# Patient Record
Sex: Female | Born: 1935 | Race: White | Hispanic: No | Marital: Married | Smoking: Never smoker
Health system: Southern US, Community
[De-identification: ages and names within clinical notes are randomized; demographics above are authoritative.]

## PROBLEM LIST (undated history)

## (undated) DIAGNOSIS — E039 Hypothyroidism, unspecified: Secondary | ICD-10-CM

## (undated) DIAGNOSIS — F0280 Dementia in other diseases classified elsewhere without behavioral disturbance: Secondary | ICD-10-CM

## (undated) DIAGNOSIS — E785 Hyperlipidemia, unspecified: Secondary | ICD-10-CM

## (undated) DIAGNOSIS — M069 Rheumatoid arthritis, unspecified: Secondary | ICD-10-CM

## (undated) DIAGNOSIS — I10 Essential (primary) hypertension: Secondary | ICD-10-CM

## (undated) HISTORY — DX: Rheumatoid arthritis, unspecified: M06.9

## (undated) HISTORY — DX: Dementia in other diseases classified elsewhere, unspecified severity, without behavioral disturbance, psychotic disturbance, mood disturbance, and anxiety: F02.80

## (undated) HISTORY — PX: APPENDECTOMY: SHX54

## (undated) HISTORY — DX: Hypothyroidism, unspecified: E03.9

## (undated) HISTORY — PX: NECK SURGERY: SHX720

## (undated) HISTORY — PX: ABDOMINAL HYSTERECTOMY: SHX81

## (undated) HISTORY — DX: Essential (primary) hypertension: I10

## (undated) HISTORY — DX: Hyperlipidemia, unspecified: E78.5

## (undated) HISTORY — PX: CHOLECYSTECTOMY: SHX55

---

## 2000-05-27 ENCOUNTER — Emergency Department (HOSPITAL_COMMUNITY): Admission: EM | Admit: 2000-05-27 | Discharge: 2000-05-27 | Payer: Self-pay | Admitting: Emergency Medicine

## 2000-05-29 ENCOUNTER — Emergency Department (HOSPITAL_COMMUNITY): Admission: EM | Admit: 2000-05-29 | Discharge: 2000-05-29 | Payer: Self-pay

## 2001-04-25 ENCOUNTER — Inpatient Hospital Stay (HOSPITAL_COMMUNITY): Admission: EM | Admit: 2001-04-25 | Discharge: 2001-04-28 | Payer: Self-pay | Admitting: Emergency Medicine

## 2001-04-25 ENCOUNTER — Encounter (INDEPENDENT_AMBULATORY_CARE_PROVIDER_SITE_OTHER): Payer: Self-pay | Admitting: Specialist

## 2001-04-25 ENCOUNTER — Encounter: Payer: Self-pay | Admitting: Emergency Medicine

## 2001-04-26 ENCOUNTER — Encounter: Payer: Self-pay | Admitting: Internal Medicine

## 2001-04-27 ENCOUNTER — Encounter: Payer: Self-pay | Admitting: General Surgery

## 2003-06-23 ENCOUNTER — Encounter: Admission: RE | Admit: 2003-06-23 | Discharge: 2003-06-23 | Payer: Self-pay | Admitting: Interventional Radiology

## 2003-11-07 ENCOUNTER — Ambulatory Visit (HOSPITAL_BASED_OUTPATIENT_CLINIC_OR_DEPARTMENT_OTHER): Admission: RE | Admit: 2003-11-07 | Discharge: 2003-11-07 | Payer: Self-pay | Admitting: Orthopedic Surgery

## 2003-11-07 ENCOUNTER — Ambulatory Visit (HOSPITAL_COMMUNITY): Admission: RE | Admit: 2003-11-07 | Discharge: 2003-11-07 | Payer: Self-pay | Admitting: Orthopedic Surgery

## 2004-09-16 ENCOUNTER — Ambulatory Visit: Payer: Self-pay | Admitting: Internal Medicine

## 2005-01-20 ENCOUNTER — Ambulatory Visit: Payer: Self-pay | Admitting: Internal Medicine

## 2005-05-27 ENCOUNTER — Ambulatory Visit: Payer: Self-pay | Admitting: Internal Medicine

## 2005-06-03 ENCOUNTER — Ambulatory Visit: Payer: Self-pay | Admitting: Internal Medicine

## 2005-08-11 ENCOUNTER — Ambulatory Visit: Payer: Self-pay | Admitting: Internal Medicine

## 2005-12-17 ENCOUNTER — Ambulatory Visit: Payer: Self-pay | Admitting: Internal Medicine

## 2006-05-08 ENCOUNTER — Encounter: Payer: Self-pay | Admitting: Internal Medicine

## 2006-07-01 ENCOUNTER — Ambulatory Visit: Payer: Self-pay | Admitting: Internal Medicine

## 2006-07-01 LAB — CONVERTED CEMR LAB
ALT: 17 units/L (ref 0–40)
AST: 25 units/L (ref 0–37)
Albumin: 4 g/dL (ref 3.5–5.2)
Alkaline Phosphatase: 65 units/L (ref 39–117)
BUN: 25 mg/dL — ABNORMAL HIGH (ref 6–23)
Bilirubin, Direct: 0.1 mg/dL (ref 0.0–0.3)
CO2: 29 meq/L (ref 19–32)
Calcium: 9.8 mg/dL (ref 8.4–10.5)
Chloride: 104 meq/L (ref 96–112)
Chol/HDL Ratio, serum: 4.7
Cholesterol: 280 mg/dL (ref 0–200)
Creatinine, Ser: 1.3 mg/dL — ABNORMAL HIGH (ref 0.4–1.2)
GFR calc non Af Amer: 43 mL/min
Glomerular Filtration Rate, Af Am: 52 mL/min/{1.73_m2}
Glucose, Bld: 117 mg/dL — ABNORMAL HIGH (ref 70–99)
HCT: 41 % (ref 36.0–46.0)
HDL: 59.1 mg/dL (ref >39.0)
Hemoglobin: 13.4 g/dL (ref 12.0–15.0)
LDL DIRECT: 200.7 mg/dL
MCHC: 32.6 g/dL (ref 30.0–36.0)
MCV: 80.9 fL (ref 78.0–100.0)
Platelets: 280 10*3/uL (ref 150–400)
Potassium: 4.6 meq/L (ref 3.5–5.1)
RBC: 5.06 M/uL (ref 3.87–5.11)
RDW: 13.7 % (ref 11.5–14.6)
Sodium: 143 meq/L (ref 135–145)
TSH: 2.34 microintl units/mL (ref 0.35–5.50)
Total Bilirubin: 0.7 mg/dL (ref 0.3–1.2)
Total Protein: 8 g/dL (ref 6.0–8.3)
Triglyceride fasting, serum: 101 mg/dL (ref 0–149)
VLDL: 20 mg/dL (ref 0–40)
WBC: 5.9 10*3/uL (ref 4.5–10.5)

## 2006-07-08 ENCOUNTER — Encounter: Payer: Self-pay | Admitting: Internal Medicine

## 2006-07-08 LAB — HM MAMMOGRAPHY

## 2007-01-19 ENCOUNTER — Encounter: Payer: Self-pay | Admitting: Internal Medicine

## 2007-01-19 DIAGNOSIS — E785 Hyperlipidemia, unspecified: Secondary | ICD-10-CM

## 2007-01-19 DIAGNOSIS — M069 Rheumatoid arthritis, unspecified: Secondary | ICD-10-CM

## 2007-01-19 DIAGNOSIS — I10 Essential (primary) hypertension: Secondary | ICD-10-CM

## 2007-01-19 DIAGNOSIS — E039 Hypothyroidism, unspecified: Secondary | ICD-10-CM

## 2007-01-19 HISTORY — DX: Rheumatoid arthritis, unspecified: M06.9

## 2007-01-19 HISTORY — DX: Essential (primary) hypertension: I10

## 2007-01-19 HISTORY — DX: Hypothyroidism, unspecified: E03.9

## 2007-01-19 HISTORY — DX: Hyperlipidemia, unspecified: E78.5

## 2007-06-24 ENCOUNTER — Ambulatory Visit: Payer: Self-pay | Admitting: Internal Medicine

## 2007-11-19 ENCOUNTER — Ambulatory Visit: Payer: Self-pay | Admitting: Internal Medicine

## 2007-11-22 LAB — CONVERTED CEMR LAB
ALT: 17 units/L (ref 0–35)
AST: 23 units/L (ref 0–37)
Albumin: 4.4 g/dL (ref 3.5–5.2)
Alkaline Phosphatase: 70 units/L (ref 39–117)
BUN: 21 mg/dL (ref 6–23)
Basophils Absolute: 0.1 10*3/uL (ref 0.0–0.1)
Basophils Relative: 1 % (ref 0–1)
Bilirubin, Direct: 0.1 mg/dL (ref 0.0–0.3)
CO2: 24 meq/L (ref 19–32)
Calcium: 10.1 mg/dL (ref 8.4–10.5)
Chloride: 103 meq/L (ref 96–112)
Cholesterol: 284 mg/dL — ABNORMAL HIGH (ref 0–200)
Creatinine, Ser: 1.1 mg/dL (ref 0.40–1.20)
Eosinophils Absolute: 0.1 10*3/uL (ref 0.0–0.7)
Eosinophils Relative: 2 % (ref 0–5)
Glucose, Bld: 91 mg/dL (ref 70–99)
HCT: 40.3 % (ref 36.0–46.0)
HDL: 67 mg/dL (ref >39)
Hemoglobin: 13 g/dL (ref 12.0–15.0)
Indirect Bilirubin: 0.4 mg/dL (ref 0.0–0.9)
LDL Cholesterol: 183 mg/dL — ABNORMAL HIGH (ref 0–99)
Lymphocytes Relative: 37 % (ref 12–46)
Lymphs Abs: 2.8 10*3/uL (ref 0.7–4.0)
MCHC: 32.3 g/dL (ref 30.0–36.0)
MCV: 80.1 fL (ref 78.0–100.0)
Monocytes Absolute: 0.7 10*3/uL (ref 0.1–1.0)
Monocytes Relative: 10 % (ref 3–12)
Neutro Abs: 3.9 10*3/uL (ref 1.7–7.7)
Neutrophils Relative %: 51 % (ref 43–77)
Platelets: 279 10*3/uL (ref 150–400)
Potassium: 4.5 meq/L (ref 3.5–5.3)
RBC: 5.03 M/uL (ref 3.87–5.11)
RDW: 15.1 % (ref 11.5–15.5)
Sodium: 140 meq/L (ref 135–145)
TSH: 5.098 microintl units/mL (ref 0.350–5.50)
Total Bilirubin: 0.5 mg/dL (ref 0.3–1.2)
Total CHOL/HDL Ratio: 4.2
Total Protein: 8.2 g/dL (ref 6.0–8.3)
Triglycerides: 171 mg/dL — ABNORMAL HIGH (ref <150)
VLDL: 34 mg/dL (ref 0–40)
WBC: 7.6 10*3/uL (ref 4.0–10.5)

## 2007-12-03 LAB — HM COLONOSCOPY

## 2007-12-22 ENCOUNTER — Encounter: Payer: Self-pay | Admitting: Internal Medicine

## 2007-12-24 ENCOUNTER — Encounter: Payer: Self-pay | Admitting: Internal Medicine

## 2008-06-06 ENCOUNTER — Encounter: Payer: Self-pay | Admitting: Internal Medicine

## 2008-06-08 ENCOUNTER — Ambulatory Visit: Payer: Self-pay | Admitting: Internal Medicine

## 2008-07-04 ENCOUNTER — Encounter: Payer: Self-pay | Admitting: Internal Medicine

## 2008-08-09 ENCOUNTER — Encounter: Payer: Self-pay | Admitting: Internal Medicine

## 2008-09-14 ENCOUNTER — Encounter: Payer: Self-pay | Admitting: Internal Medicine

## 2008-12-12 ENCOUNTER — Encounter: Payer: Self-pay | Admitting: Internal Medicine

## 2009-01-05 ENCOUNTER — Ambulatory Visit: Payer: Self-pay | Admitting: Internal Medicine

## 2009-01-08 LAB — CONVERTED CEMR LAB
Cholesterol: 262 mg/dL — ABNORMAL HIGH (ref 0–200)
Direct LDL: 175.1 mg/dL
HDL: 53.4 mg/dL (ref >39.00)
TSH: 3.75 microintl units/mL (ref 0.35–5.50)
Total CHOL/HDL Ratio: 5
Triglycerides: 134 mg/dL (ref 0.0–149.0)
VLDL: 26.8 mg/dL (ref 0.0–40.0)

## 2009-01-16 ENCOUNTER — Encounter: Payer: Self-pay | Admitting: Internal Medicine

## 2009-05-22 ENCOUNTER — Encounter: Payer: Self-pay | Admitting: Internal Medicine

## 2009-07-04 ENCOUNTER — Ambulatory Visit: Payer: Self-pay | Admitting: Internal Medicine

## 2009-07-04 LAB — CONVERTED CEMR LAB
ALT: 17 units/L (ref 0–35)
AST: 23 units/L (ref 0–37)
Albumin: 4.2 g/dL (ref 3.5–5.2)
Alkaline Phosphatase: 59 units/L (ref 39–117)
BUN: 23 mg/dL (ref 6–23)
Basophils Absolute: 0.1 10*3/uL (ref 0.0–0.1)
Basophils Relative: 1.3 % (ref 0.0–3.0)
Bilirubin, Direct: 0 mg/dL (ref 0.0–0.3)
CO2: 28 meq/L (ref 19–32)
Calcium: 10.2 mg/dL (ref 8.4–10.5)
Chloride: 106 meq/L (ref 96–112)
Cholesterol: 209 mg/dL — ABNORMAL HIGH (ref 0–200)
Creatinine, Ser: 1.1 mg/dL (ref 0.4–1.2)
Direct LDL: 126.6 mg/dL
Eosinophils Absolute: 0.1 10*3/uL (ref 0.0–0.7)
Eosinophils Relative: 2.1 % (ref 0.0–5.0)
GFR calc non Af Amer: 51.82 mL/min (ref >60)
Glucose, Bld: 107 mg/dL — ABNORMAL HIGH (ref 70–99)
HCT: 38.2 % (ref 36.0–46.0)
HDL: 61.9 mg/dL (ref >39.00)
Hemoglobin: 12.4 g/dL (ref 12.0–15.0)
Lymphocytes Relative: 37.8 % (ref 12.0–46.0)
Lymphs Abs: 2.3 10*3/uL (ref 0.7–4.0)
MCHC: 32.6 g/dL (ref 30.0–36.0)
MCV: 86.4 fL (ref 78.0–100.0)
Monocytes Absolute: 0.7 10*3/uL (ref 0.1–1.0)
Monocytes Relative: 10.9 % (ref 3.0–12.0)
Neutro Abs: 2.8 10*3/uL (ref 1.4–7.7)
Neutrophils Relative %: 47.9 % (ref 43.0–77.0)
Platelets: 257 10*3/uL (ref 150.0–400.0)
Potassium: 5.2 meq/L — ABNORMAL HIGH (ref 3.5–5.1)
RBC: 4.42 M/uL (ref 3.87–5.11)
RDW: 14.4 % (ref 11.5–14.6)
Sodium: 144 meq/L (ref 135–145)
Total Bilirubin: 0.7 mg/dL (ref 0.3–1.2)
Total CHOL/HDL Ratio: 3
Total Protein: 8 g/dL (ref 6.0–8.3)
Triglycerides: 124 mg/dL (ref 0.0–149.0)
VLDL: 24.8 mg/dL (ref 0.0–40.0)
WBC: 6 10*3/uL (ref 4.5–10.5)

## 2009-08-23 ENCOUNTER — Encounter: Payer: Self-pay | Admitting: Internal Medicine

## 2009-10-16 ENCOUNTER — Encounter: Payer: Self-pay | Admitting: Internal Medicine

## 2009-11-08 ENCOUNTER — Encounter: Payer: Self-pay | Admitting: Internal Medicine

## 2009-12-14 ENCOUNTER — Encounter: Payer: Self-pay | Admitting: Internal Medicine

## 2010-05-08 ENCOUNTER — Encounter: Payer: Self-pay | Admitting: Internal Medicine

## 2010-08-20 ENCOUNTER — Encounter: Payer: Self-pay | Admitting: Internal Medicine

## 2010-09-05 NOTE — Letter (Signed)
Summary: [facility]  [facility]   Imported By: Maryln Gottron 11/28/2009 14:51:41  _____________________________________________________________________  External Attachment:    Type:   Image     Comment:   External Document

## 2010-09-05 NOTE — Letter (Signed)
Summary: [facility]  [facility]   Imported By: Maryln Gottron 10/26/2009 13:13:53  _____________________________________________________________________  External Attachment:    Type:   Image     Comment:   External Document

## 2010-09-05 NOTE — Letter (Signed)
Summary: Request for Surgical Clearance/[facility]  Request for Surgical Clearance/[facility]   Imported By: Maryln Gottron 11/16/2009 09:47:02  _____________________________________________________________________  External Attachment:    Type:   Image     Comment:   External Document

## 2010-09-05 NOTE — Letter (Signed)
Summary: Request for Surgical Clearance/[facility]  Request for Surgical Clearance/[facility]   Imported By: Maryln Gottron 08/30/2009 10:28:31  _____________________________________________________________________  External Attachment:    Type:   Image     Comment:   External Document

## 2010-09-05 NOTE — Letter (Signed)
Summary: [facility]  [facility]   Imported By: Maryln Gottron 05/17/2010 14:42:14  _____________________________________________________________________  External Attachment:    Type:   Image     Comment:   External Document

## 2010-09-11 NOTE — Letter (Signed)
Summary: [facility]  [facility]   Imported By: Maryln Gottron 09/04/2010 15:23:41  _____________________________________________________________________  External Attachment:    Type:   Image     Comment:   External Document

## 2010-10-02 ENCOUNTER — Encounter: Payer: Self-pay | Admitting: Internal Medicine

## 2010-10-03 ENCOUNTER — Ambulatory Visit (INDEPENDENT_AMBULATORY_CARE_PROVIDER_SITE_OTHER): Payer: Medicare Other | Admitting: Internal Medicine

## 2010-10-03 ENCOUNTER — Encounter: Payer: Self-pay | Admitting: Internal Medicine

## 2010-10-03 VITALS — BP 120/72 | Temp 98.2°F | Wt 153.0 lb

## 2010-10-03 DIAGNOSIS — I10 Essential (primary) hypertension: Secondary | ICD-10-CM

## 2010-10-03 DIAGNOSIS — M5410 Radiculopathy, site unspecified: Secondary | ICD-10-CM

## 2010-10-03 DIAGNOSIS — M5412 Radiculopathy, cervical region: Secondary | ICD-10-CM

## 2010-10-03 DIAGNOSIS — E039 Hypothyroidism, unspecified: Secondary | ICD-10-CM

## 2010-10-03 DIAGNOSIS — E785 Hyperlipidemia, unspecified: Secondary | ICD-10-CM

## 2010-10-03 MED ORDER — SIMVASTATIN 80 MG PO TABS: 80.0000 mg | ORAL_TABLET | Freq: Every day | ORAL | Status: DC

## 2010-10-03 MED ORDER — BENAZEPRIL-HYDROCHLOROTHIAZIDE 10-12.5 MG PO TABS: 1.0000 | ORAL_TABLET | Freq: Every day | ORAL | Status: DC

## 2010-10-03 MED ORDER — LEVOTHYROXINE SODIUM 50 MCG PO TABS: 50.0000 ug | ORAL_TABLET | Freq: Every day | ORAL | Status: DC

## 2010-10-03 NOTE — Patient Instructions (Signed)
Limit your sodium (Salt) intake  Take a calcium supplement, plus [phone] units of vitamin D    It is important that you exercise regularly, at least 20 minutes 3 to 4 times per week.  If you develop chest pain or shortness of breath seek  medical attention.  MRI as scheduled  Rheumatology follow up Return in 6 months for follow-up

## 2010-10-03 NOTE — Progress Notes (Signed)
  Subjective:    Patient ID: [name] [name], female    DOB: 17-Aug-1934, 76 y.o.   MRN: [id number]  HPI   76 year old patient who presents with left shoulder and left arm pain since November. She has also developed numbness involving her left fifth digit. Pain is constant and has become progressively more severe. She has treated hypothyroidism dyslipidemia and hypertension. She continues to do well. Denies any cardiopulmonary complaints. She does have a history of rheumatoid arthritis presently on methotrexate.   Review of Systems  Constitutional: Negative.   HENT: Negative for hearing loss, congestion, sore throat, rhinorrhea, dental problem, sinus pressure and tinnitus.   Eyes: Negative for pain, discharge and visual disturbance.  Respiratory: Negative for cough and shortness of breath.   Cardiovascular: Negative for chest pain, palpitations and leg swelling.  Gastrointestinal: Negative for nausea, vomiting, abdominal pain, diarrhea, constipation, blood in stool and abdominal distention.  Genitourinary: Negative for dysuria, urgency, frequency, hematuria, flank pain, vaginal bleeding, vaginal discharge, difficulty urinating, vaginal pain and pelvic pain.  Musculoskeletal: Negative for joint swelling, arthralgias and gait problem.       [ Complaining of left shoulder and left arm pain Skin: Negative for rash.  Neurological: Negative for dizziness, syncope, speech difficulty, weakness, numbness ( numbness involving her left fifth finger) and headaches.  Hematological: Negative for adenopathy.  Psychiatric/Behavioral: Negative for behavioral problems, dysphoric mood and agitation. The patient is not nervous/anxious.        Objective:   Physical Exam  Constitutional: She is oriented to person, place, and time. She appears well-developed and well-nourished.  HENT:  Head: Normocephalic.  Right Ear: External ear normal.  Left Ear: External ear normal.  Mouth/Throat: Oropharynx is clear and  moist.  Eyes: Conjunctivae and EOM are normal. Pupils are equal, round, and reactive to light.  Neck: Normal range of motion. Neck supple. No thyromegaly present.  Cardiovascular: Normal rate, regular rhythm, normal heart sounds and intact distal pulses.   Pulmonary/Chest: Effort normal and breath sounds normal.  Abdominal: Soft. Bowel sounds are normal. She exhibits no mass. There is no tenderness.  Musculoskeletal: Normal range of motion.  Lymphadenopathy:    She has no cervical adenopathy.  Neurological: She is alert and oriented to person, place, and time. She has normal reflexes.        Numbness involving the left fifth finger  Reflexes hypoactive but symmetrical  Decreased left grip strength ( right dominant)  Skin: Skin is warm and dry. No rash noted.  Psychiatric: She has a normal mood and affect. Her behavior is normal.          Assessment & Plan:   chronic left shoulder and arm pain with numbness of the left fifth finger. Probable cervical radiculopathy. In view of the chronicity of the symptoms that are worsening we'll set up for a cervical MRI  Hypertension stable  Rheumatoid arthritis  Hypothyroidism  Dyslipidemia- we'll continue simvastatin

## 2010-10-08 ENCOUNTER — Other Ambulatory Visit: Payer: Self-pay | Admitting: Internal Medicine

## 2010-10-09 ENCOUNTER — Ambulatory Visit
Admission: RE | Admit: 2010-10-09 | Discharge: 2010-10-09 | Disposition: A | Discharge status: Home / Self Care | Payer: BC Managed Care – PPO | Source: Ambulatory Visit | Attending: Internal Medicine | Admitting: Internal Medicine

## 2010-10-09 DIAGNOSIS — M5410 Radiculopathy, site unspecified: Secondary | ICD-10-CM

## 2010-10-10 NOTE — Progress Notes (Signed)
Quick Note:  spke with pt - informed of Dr. Amador Cunas instructions. KIK ______

## 2010-10-22 ENCOUNTER — Ambulatory Visit: Payer: Medicare Other | Attending: Neurosurgery | Admitting: Physical Therapy

## 2010-10-22 DIAGNOSIS — M542 Cervicalgia: Secondary | ICD-10-CM | POA: Insufficient documentation

## 2010-10-22 DIAGNOSIS — IMO0001 Reserved for inherently not codable concepts without codable children: Secondary | ICD-10-CM | POA: Insufficient documentation

## 2010-10-22 DIAGNOSIS — M25519 Pain in unspecified shoulder: Secondary | ICD-10-CM | POA: Insufficient documentation

## 2010-10-22 DIAGNOSIS — M2569 Stiffness of other specified joint, not elsewhere classified: Secondary | ICD-10-CM | POA: Insufficient documentation

## 2010-10-24 ENCOUNTER — Ambulatory Visit: Payer: Medicare Other | Admitting: Physical Therapy

## 2010-10-29 ENCOUNTER — Ambulatory Visit: Payer: Medicare Other | Attending: Neurosurgery | Admitting: Physical Therapy

## 2010-10-29 ENCOUNTER — Ambulatory Visit: Payer: Medicare Other | Admitting: Physical Therapy

## 2010-10-29 DIAGNOSIS — M2569 Stiffness of other specified joint, not elsewhere classified: Secondary | ICD-10-CM | POA: Insufficient documentation

## 2010-10-29 DIAGNOSIS — M25519 Pain in unspecified shoulder: Secondary | ICD-10-CM | POA: Insufficient documentation

## 2010-10-29 DIAGNOSIS — IMO0001 Reserved for inherently not codable concepts without codable children: Secondary | ICD-10-CM | POA: Insufficient documentation

## 2010-10-29 DIAGNOSIS — M542 Cervicalgia: Secondary | ICD-10-CM | POA: Insufficient documentation

## 2010-10-31 ENCOUNTER — Ambulatory Visit: Payer: Medicare Other | Admitting: Physical Therapy

## 2010-11-05 ENCOUNTER — Ambulatory Visit: Payer: Medicare Other | Admitting: Physical Therapy

## 2010-11-07 ENCOUNTER — Ambulatory Visit
Admission: RE | Admit: 2010-11-07 | Discharge: 2010-11-07 | Disposition: A | Discharge status: Home / Self Care | Payer: Medicare Other | Source: Ambulatory Visit | Attending: Neurosurgery | Admitting: Neurosurgery

## 2010-11-07 ENCOUNTER — Other Ambulatory Visit: Payer: Self-pay | Admitting: Neurosurgery

## 2010-11-07 DIAGNOSIS — M542 Cervicalgia: Secondary | ICD-10-CM

## 2010-11-15 ENCOUNTER — Other Ambulatory Visit: Payer: Self-pay | Admitting: Internal Medicine

## 2010-12-04 ENCOUNTER — Ambulatory Visit (HOSPITAL_COMMUNITY)
Admission: RE | Admit: 2010-12-04 | Discharge: 2010-12-04 | Disposition: A | Discharge status: Home / Self Care | Payer: Medicare Other | Source: Ambulatory Visit | Attending: Neurosurgery | Admitting: Neurosurgery

## 2010-12-04 ENCOUNTER — Encounter (HOSPITAL_COMMUNITY)
Admission: RE | Admit: 2010-12-04 | Discharge: 2010-12-04 | Disposition: A | Discharge status: Home / Self Care | Payer: Medicare Other | Source: Ambulatory Visit | Attending: Neurosurgery | Admitting: Neurosurgery

## 2010-12-04 ENCOUNTER — Other Ambulatory Visit (HOSPITAL_COMMUNITY): Payer: Self-pay | Admitting: Neurosurgery

## 2010-12-04 DIAGNOSIS — Z01810 Encounter for preprocedural cardiovascular examination: Secondary | ICD-10-CM | POA: Insufficient documentation

## 2010-12-04 DIAGNOSIS — M5412 Radiculopathy, cervical region: Secondary | ICD-10-CM

## 2010-12-04 DIAGNOSIS — Z01812 Encounter for preprocedural laboratory examination: Secondary | ICD-10-CM | POA: Insufficient documentation

## 2010-12-04 DIAGNOSIS — Z01811 Encounter for preprocedural respiratory examination: Secondary | ICD-10-CM | POA: Insufficient documentation

## 2010-12-04 LAB — BASIC METABOLIC PANEL
BUN: 18 mg/dL (ref 6–23)
CO2: 28 mEq/L (ref 19–32)
Calcium: 10.1 mg/dL (ref 8.4–10.5)
Chloride: 99 mEq/L (ref 96–112)
Creatinine, Ser: 1.03 mg/dL (ref 0.4–1.2)
GFR calc Af Amer: >60 mL/min (ref >60)
GFR calc non Af Amer: 53 mL/min — ABNORMAL LOW (ref >60)
Glucose, Bld: 100 mg/dL — ABNORMAL HIGH (ref 70–99)
Potassium: 5.1 mEq/L (ref 3.5–5.1)
Sodium: 136 mEq/L (ref 135–145)

## 2010-12-04 LAB — CBC
HCT: 37.5 % (ref 36.0–46.0)
Hemoglobin: 12 g/dL (ref 12.0–15.0)
MCH: 26.6 pg (ref 26.0–34.0)
MCHC: 32 g/dL (ref 30.0–36.0)
MCV: 83.1 fL (ref 78.0–100.0)
Platelets: 283 10*3/uL (ref 150–400)
RBC: 4.51 MIL/uL (ref 3.87–5.11)
RDW: 16.1 % — ABNORMAL HIGH (ref 11.5–15.5)
WBC: 7.3 10*3/uL (ref 4.0–10.5)

## 2010-12-04 LAB — SURGICAL PCR SCREEN
MRSA, PCR: NEGATIVE
Staphylococcus aureus: POSITIVE — AB

## 2010-12-04 LAB — ABO/RH: ABO/RH(D): O POS

## 2010-12-04 LAB — TYPE AND SCREEN
ABO/RH(D): O POS
Antibody Screen: NEGATIVE

## 2010-12-09 ENCOUNTER — Inpatient Hospital Stay (HOSPITAL_COMMUNITY)
Admission: RE | Admit: 2010-12-09 | Discharge: 2010-12-10 | DRG: 473 | Disposition: A | Discharge status: Home / Self Care | Payer: Medicare Other | Source: Ambulatory Visit | Attending: Neurosurgery | Admitting: Neurosurgery

## 2010-12-09 ENCOUNTER — Inpatient Hospital Stay (HOSPITAL_COMMUNITY): Payer: Medicare Other

## 2010-12-09 DIAGNOSIS — M5020 Other cervical disc displacement, unspecified cervical region: Secondary | ICD-10-CM | POA: Diagnosis present

## 2010-12-09 DIAGNOSIS — I10 Essential (primary) hypertension: Secondary | ICD-10-CM | POA: Diagnosis present

## 2010-12-09 DIAGNOSIS — E039 Hypothyroidism, unspecified: Secondary | ICD-10-CM | POA: Diagnosis present

## 2010-12-09 DIAGNOSIS — M069 Rheumatoid arthritis, unspecified: Secondary | ICD-10-CM | POA: Diagnosis present

## 2010-12-09 DIAGNOSIS — M47812 Spondylosis without myelopathy or radiculopathy, cervical region: Principal | ICD-10-CM | POA: Diagnosis present

## 2010-12-20 NOTE — Op Note (Signed)
[facility]  Patient:    [name], [name] Visit Number: [id number] MRN: [id number]          Service Type: MED Location: 3W [phone] Attending Physician:  Dolores Patty Dictated by:   Anselm Pancoast. Zachery Dakins, M.D. Proc. Date: 04/27/01 Admit Date:  04/25/2001   CC:         Titus Dubin. Alwyn Ren, M.D. [facility]   Operative Report  PREOPERATIVE DIAGNOSIS:  Chronic cholecystitis.  POSTOPERATIVE DIAGNOSES:  Subacute cholecystitis, normal cholangiogram.  OPERATION PERFORMED:  Laparoscopic cholecystectomy with cholangiogram.  ANESTHESIA:  General.  SURGEON:  Anselm Pancoast. Zachery Dakins, M.D.  ASSISTANT:  Zigmund Daniel, M.D.  HISTORY OF PRESENT ILLNESS:  [name] [name] is a 76 year old Caucasian female who was admitted by Dr. Lona Kettle on April 25, 2001 with substernal chest pain radiating to her back. She had a normal EKG and the cardiac evaluation and liver tests were mildly abnormal. An ultrasound of the gallbladder was obtained yesterday that showed stones. Her liver tests have returned to normal and she is minimally tender at this time. I was asked to see her this morning and on physical examination she was not acutely tender but she could be added to the OR schedule and she desired to proceed promptly.  DESCRIPTION OF PROCEDURE:  The patient was given 3 gm of Unasyn immediately preoperatively and her legs have PAS stockings. The patient was induced general anesthesia, endotracheal tube and oral tube into the stomach. The abdomen was prepped with Betadine surgical scrub and solution and draped in a sterile manner. A small incision was made below the umbilicus. She had had a previous laparoscopic exam with the fascia identified and this picked up between two hemostats, a small opening made and the underlying peritoneum identified and I carefully opened into this. Fortunately she didnt have any adhesions at the umbilicus and the Hasson cannula was  introduced after a pursestring suture placed. The gallbladder was subacutely inflamed. One area was very bilious stained and the upper 10 mm trocar was placed under direct vision. After anesthetizing the fascia, Dr. Orson Slick placed the two lateral 5 mm trocars at the appropriate position after anesthetizing the fascia. The gallbladder was retracted upward and outward. The peritoneum proximally was opened up and you could identify a fairly large cystic duct and cystic artery which was doubly clipped proximally, singly distally and divided and then a clip placed flush with the gallbladder at the junction of the cystic duct. A small opening was made within the cystic duct. A valve was noted and this was sort of opened up with a right angled clamp and then a [employer] was introduced into the catheter into the cystic duct and held in place with a clip. An x-ray was brought in and injection. There was a very low joining of the cystic duct in the distal common hepatic duct and good flow into the duodenum and then the intrahepatic radicles filled. I then reinflated the carbon dioxide, removed the Cook catheter, triply clipped the cystic duct. We did not dissect out the more proximal portion of the cystic duct but clipped it, divided it and then there was a posterior branch of the cystic artery that was seen and this was doubly clipped and then the gallbladder was freed from its bed using the hook electrocautery. I did place the gallbladder within an endocatch bag, removed the irrigating fluid that had been used, reinspected and there was good hemostasis. A couple of little areas were coagulated  and then the camera was placed in the upper 10 mm port and the bag containing the gallbladder was withdrawn through the umbilicus. I then closed the fascia with a second figure-of-eight of #0 Vicryl and then tied both the pursestring and a figure-of-eight and then the two lateral 5 mm trocars were  withdrawn under prompt direct vision.  The carbon dioxide was released and the upper 10 mm trocar withdrawn. I did place a figure-of-eight of #0 Vicryl in the fascia into the subxiphoid area so that you could seen within the peritoneal cavity, she is very thin. The subcutaneous wounds were closed with 4-0 Vicryl and then Benzoin and Steri-Strips on the skin. The patient tolerated the procedure nicely and was extubated and taken to the recovery room in a stable postop condition. Hopefully she will be ready for discharge in the a.m. Dictated by:   Anselm Pancoast. Zachery Dakins, M.D. Attending Physician:  Dolores Patty DD:  04/27/01 TD:  04/27/01 Job: [id number]
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
## 2010-12-20 NOTE — Op Note (Signed)
NAME:  [name] [name], [name] [name]                        ACCOUNT NO.:  [id number]   MEDICAL RECORD NO.:  [id number]                   PATIENT TYPE:  AMB   LOCATION:  DSC                                  FACILITY:  [facility]   PHYSICIAN:  Feliberto Gottron. Turner Daniels, M.D.                DATE OF BIRTH:  1935/01/06   DATE OF PROCEDURE:  11/07/2003  DATE OF DISCHARGE:                                 OPERATIVE REPORT   PREOPERATIVE DIAGNOSES:  Left knee cartilage tears.   POSTOPERATIVE DIAGNOSES:  Left knee cartilage tears with loose bodies.   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  Arthroscopic  removal of loose bodies.   SURGEON:  Feliberto Gottron. Turner Daniels, M.D.   ASSISTANT:  None.   ANESTHESIA:  General LMA.   ESTIMATED BLOOD LOSS:  Minimal.   FLUIDS REPLACED:  500 mL crystalloids.   DRAINS:  None.   TOURNIQUET TIME:  None.   INDICATIONS FOR PROCEDURE:  The patient is a 76 year old woman followed by  Sanjeev K. Deveshwar, M.D. for rheumatoid arthritis with mechanical  catching, popping and pain in her left knee with persistent effusions  despite methotrexate.  This is a sudden change over the last couple of  months and may represent a cartilage tear.  Plain radiographs do show some  loss of articular cartilage to the lateral compartment on the standing  weightbearing views but overall she has at least a few millimeters remaining  by x-ray.  She has failed conservative treatment and now desires elective  arthroscopic evaluation and treatment of her left knee.   DESCRIPTION OF PROCEDURE:  The patient was identified by arm band and taken  to the operating room at [facility].  Appropriate  anesthetic monitors were attached.  General LMA anesthesia was induced with  the patient in the supine position.  Lateral post applied to  the table.  The  left lower extremity prepped and draped in the usual sterile fashion from  the ankle to the midthigh and then using a #11 blade, standard inferomedial  and inferolateral peripatellar portals were made allowing introduction of  the arthroscope through the inferolateral portal and the outflow through the  inferomedial portal.  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  The  trochlea grade 3.  In the medial compartment she had degenerative tearing of  the medial meniscus.  These areas were debrided with a 3.5 Gator sucker  shaver and photographic documentation was made.  We also found numerous  cartilaginous loose bodies in the knee that were removed with a 3.5 Gator  sucker shaver or arthroscopic graspers.  The patient also had inflamed  synovium to all three compartments and this was  debrided as well.  Pressure  during the knee scope was between 60 mm and 70 mm on the pump.  At the end  of the case we went ahead and injected the portals with 3 to 4 mL of 0.5%  Marcaine with epinephrine solution and another 12 mL were placed in the  joint itself.  The knee was irrigated out with normal saline solution and  the arthroscopic instruments were removed.  The cruciate ligaments were  intact as was the lateral meniscus.  The patient did have some  chondromalacia of the lateral tibial condyle grade 2 to grade 3 also  debrided.  A dressing of Xeroform, 4 x 4 dressing sponges, Webril and Ace  wrap was applied.  The patient was awakened and taken to the recovery room  without difficulty.                                               Feliberto Gottron. Turner Daniels, M.D.    Ovid Curd  D:  11/07/2003  T:  11/08/2003  Job:  [id number]

## 2010-12-20 NOTE — Assessment & Plan Note (Signed)
[facility] OFFICE NOTE   NAME:HOPKINSKinaya, [name]                     MRN:          [id number]  DATE:07/01/2006                            DOB:          [birth year]    Wednesday, July 01, 2006   Patient is a 76 year old female seen today for an annual exam.  She has  a history of hypothyroidism and hypertension.  She has been treated with  methotrexate in the past for rheumatoid arthritis but this has been  quiescent off medication. She has a history of some mild dyslipidemia  with a favorable HDL.  Operations have included a total hysterectomy at  age 61, appendectomy in 2002, a laparoscopic cholecystectomy.  She does  get annual mammograms.   ALLERGIES:  CODEINE and LIPITOR.   MEDICATIONS:  Synthroid and Benazepril with hydrochlorothiazide.   PHYSICAL EXAMINATION:  GENERAL APPEARANCE:  A well-developed, thin white  female in no acute distress.  VITAL SIGNS:  Blood pressure was 126/72.  SKIN:  Negative, fair complexion.  HEENT:  Fundi, ears, nose and throat clear.  NECK:  No bruits.  CHEST:  Clear.  BREASTS:  Negative.  CARDIOVASCULAR:  Normal heart sounds, no murmurs.  ABDOMEN:  Benign, no organomegaly and no bruits appreciated.  PELVIC:  Absent uterus, no adnexal masses.  RECTAL:  Stool heme-negative.  EXTREMITIES:  Full peripheral pulses.  NEUROLOGIC:  Negative.   IMPRESSION:  1. Hypertension.  2. Hypothyroidism.  3. Menopausal syndrome.   DISPOSITION:  Laboratory update will be reviewed.  Continue present  regimen, daily aspirin encouraged.  Bone density study will be  discussed.     Gordy Savers, MD  Electronically Signed    PFK/MedQ  DD: 07/01/2006  DT: 07/01/2006  Job #: [id number]

## 2010-12-20 NOTE — Discharge Summary (Signed)
[facility]  Patient:    [name] [name], [name] [name] Visit Number: [id number] MRN: [id number]          Service Type: MED Location: 3W [phone] Attending Physician:  [name] [name] Dictated by:   [name] [name], M.D. Admit Date:  04/25/2001 Discharge Date: 04/28/2001   CC:         [name] [name]. [name] [name], M.D. [facility]  [name] [name], M.D. [facility]   Discharge Summary  DISCHARGE DIAGNOSIS:  Subacute cholecystitis with possibly passage of a common duct stone.  HISTORY OF PRESENT ILLNESS/HOSPITAL CORUSE:  [name] is a 76 year old Caucasian female admitted through the ER by Dr. Marga [name] for substernal pain that had started approximately eight hours earlier.  Patient states the pain was substernal without radiation to the arm, was associated with nausea and vomiting, and she was short of breath and diaphoretic.  In the emergency room, nitroglycerin did not give any help and her EKG showed nonspecific T waves.  She had a family history of myocardial infarction in two brothers and a past history of a hiatus hernia.  She was noted on abdominal exam to have a palpable aortic pulse but no note made of acute abdominal tenderness.  She was admitted for telemetry and an EKG, and cardiac evaluation.  The patient over the next 24 hours showed no cardiac ischemia.  Her liver functions were noted to be slightly elevated at 51 to 224 SGOT, SGPT 16 to 95, a total bilirubin went from 0.4 to 1.4, and her CPK-MBs were negative.  She then was referred for an ultrasound of the gallbladder that showed stones and this being confirmed, Dr. Alwyn [name] asked me to see her on September 24.  On exam, her amylase and lipase had not been elevated and she was definitely mildly tender in the upper abdomen, and I recommended that we add her on to the OR schedule for a laparoscopic cholecystectomy.  This was done later that day and she had a subacutely inflamed gallbladder and normal  cholangiogram.  Postoperatively, she did nicely and was ready to be discharged the following morning.  She will continue with her blood pressure, thyroid medication, and has a prescription for Tylox if needed for incisional pain.  She will see me in the office in approximately two weeks and, clinically, I am wondering with the elevated liver tests, that she possibly may have passed a small common duct stone, but her cholangiogram was normal at the time of surgery.  Hopefully, she will have no further problems and we feel that the cardiac evaluation showed no evidence of acute ischemia or acute problems on this admission. Dictated by:   [name] [name], M.D. Attending Physician:  [name] [name] DD:  05/27/01 TD:  05/29/01 Job: [id number]

## 2011-01-03 NOTE — Op Note (Signed)
[name], [name]              ACCOUNT NO.:  [id number]  MEDICAL RECORD NO.:  [id number]           PATIENT TYPE:  I  LOCATION:  3526                         FACILITY:  [facility]  PHYSICIAN:  Donalee Citrin, M.D.        DATE OF BIRTH:  April 30, 1935  DATE OF PROCEDURE:  12/09/2010 DATE OF DISCHARGE:                              OPERATIVE REPORT   PREOPERATIVE DIAGNOSES:  Cervical spondylosis with stenosis, spinal cord compression at C5-6, C6-7 and left C8 radiculopathy from ruptured disk at C7-T1.  POSTOPERATIVE DIAGNOSES:  Cervical spondylosis with stenosis, spinal cord compression at C5-6, C6-7 and left C8 radiculopathy from ruptured disk at C7-T1.  PROCEDURES:  Anterior cervical diskectomies and fusion at C5-6, C6-7, C7- T1 using Globus PEEK cages packed with locally harvested graft and Atlantis translational plating system with eight 30-mm fixed angle screws.  SURGEON:  Donalee Citrin, MD  ASSISTANT:  Kathaleen Maser. Pool, MD  ANESTHESIA:  General endotracheal.  HISTORY OF PRESENT ILLNESS:  This patient is a very pleasant 76 year old female who has had progressive worsening neck and predominant left shoulder and arm pain with weakness, numbness, and atrophy of her hand intrinsics of her left hand.  MRI scan showed severe multilevel spondylosis, the worst of which was C5-6, C6-7 with severe spinal cord compression, but also had foraminal disk herniation at C7-T1 displacing the left C8 nerve root.  Due to the patient's failure of conservative treatment and clinical exam with deteriorating strength and atrophy, the patient was recommended three-level anterior cervical diskectomy fusion. Risks and benefits of the operation were explained to her.  She understood and agreed to proceed forth.  So the patient was brought to the OR, was induced under general anesthesia, positioned supine, neck in slight extension and 5 pounds of halter traction.  The left side of the neck was prepped and draped  in usual sterile fashion and after preoperative x-ray localized the appropriate level, a curvilinear incision was made just off midline to the to the anterior border of the sternocleidomastoid on the left side of her neck.  Then the platysma was dissected free and divided longitudinally.  The avascular plane between the sternocleidomastoid and strap muscle was developed down to the prevertebral fascia. Prevertebral fascia was dissected with Kittners.  Intraoperative identified the appropriate level at C6-7, so annulotomies were made at L3 disk spaces and the longus colli was reflected laterally and self- retaining retractor was placed.  All three disk and osteophytes were bitten off with 2 and 3-mm Kerrison punch.  Disk spaces were drilled down the posterior annulus osteophyte complex and then the operative microscope draped, brought into the field, and under microscopic illumination, first working at C6-7 disk space was further drilled down capturing all bone shavings in mucus trap under biting both endplates to identify the PLL, which was removed in piecemeal fashion exposing the thecal sac.  Then marching across the patient's left side, the left C8 nerve root was identified.  There was marked free fragments of disk in the C8 foramen.  These were all teased away and removed with Kerrison rongeurs.  The C8 nerve root was  skeletonized, flushed with pedicle and after adequate decompression had been achieved, marching across the right side as well decompressed the right C8 nerve root and the endplates were then scraped and a size 8 Globus PEEK cage packed with local autograft mixed with Actifuse was then inserted on that C7-T1. Attention was taken to C5-6 in similar fashion, disk space was drilled down capturing the bone shavings in mucus trap under biting of both endplates decompressed the central canal.  PLL was removed in piecemeal fashion marching across laterally both C7 nerve roots  were also decompressed and skeletonized, flushed with pedicle.  Pathology here was predominantly central disk, but osteophyte and uncinate hypertrophy. Working at C5-6, there was marked compression of left C6 nerve root. This was all teased away and the uncinate was disarticulated and removed, skeletonizing the C6 nerve root on the left, flushed with pedicle until adequate decompression were achieved, marching across the right-sided, right side was also decompressed and then all endplates were scraped, size 7-mm grafts were inserted at C5-6 and C6-7 and after good position of those were confirmed, Atlantis translational plate was selected 55 mm and an eight 13-mm angled screws were placed.  All screws had excellent purchase.  Locking mechanisms were engaged.  Wound was then copiously irrigated and meticulous hemostasis was maintained. Platysma was reapproximated with interrupted Vicryl after a JP drain was placed and the wound was closed in layers with interrupted Vicryl and running subcuticular.  Benzoin and Steri-Strips were applied.  The patient went to the recovery room in stable condition.  At the end of the case, sponge and instrument counts were correct.          ______________________________ Donalee Citrin, M.D.     GC/MEDQ  D:  12/09/2010  T:  12/09/2010  Job:  [id number]  Electronically Signed by Donalee Citrin M.D. on 01/03/2011 06:52:04 AM

## 2011-01-14 ENCOUNTER — Ambulatory Visit
Admission: RE | Admit: 2011-01-14 | Discharge: 2011-01-14 | Disposition: A | Discharge status: Home / Self Care | Payer: Medicare Other | Source: Ambulatory Visit | Attending: Neurosurgery | Admitting: Neurosurgery

## 2011-01-14 ENCOUNTER — Other Ambulatory Visit: Payer: Self-pay | Admitting: Neurosurgery

## 2011-01-14 DIAGNOSIS — M542 Cervicalgia: Secondary | ICD-10-CM

## 2011-01-22 NOTE — Discharge Summary (Signed)
  NAMEMAYTHE, DERAMO              ACCOUNT NO.:  [id number]  MEDICAL RECORD NO.:  [id number]           PATIENT TYPE:  I  LOCATION:  3526                         FACILITY:  [facility]  PHYSICIAN:  Donalee Citrin, M.D.        DATE OF BIRTH:  10/21/1934  DATE OF ADMISSION:  12/09/2010 DATE OF DISCHARGE:  12/10/2010                              DISCHARGE SUMMARY   ADMITTING DIAGNOSIS:  Cervical spondylosis at C5-6, C6-7, and C7-T1  PROCEDURE DURING THIS HOSPITALIZATION:  Anterior cervical diskectomy and fusion at C5-6, C6-7, and C7-T1.  HOSPITAL COURSE:  The patient was admitted in the evening, went to operating room, underwent the aforementioned procedure. Postoperatively, the patient did very well, went to recovery room floor. On the floor, the patient was convalescing well, tolerating a regular diet, ambulating and voiding spontaneously and was able to be discharged home on postop day 1.          ______________________________ Donalee Citrin, M.D.     GC/MEDQ  D:  01/03/2011  T:  01/03/2011  Job:  [id number]  Electronically Signed by Donalee Citrin M.D. on 01/22/2011 08:33:03 AM

## 2011-02-27 ENCOUNTER — Other Ambulatory Visit: Payer: Self-pay | Admitting: Neurosurgery

## 2011-02-27 ENCOUNTER — Ambulatory Visit
Admission: RE | Admit: 2011-02-27 | Discharge: 2011-02-27 | Disposition: A | Discharge status: Home / Self Care | Payer: Medicare Other | Source: Ambulatory Visit | Attending: Neurosurgery | Admitting: Neurosurgery

## 2011-02-27 DIAGNOSIS — M542 Cervicalgia: Secondary | ICD-10-CM

## 2011-04-09 ENCOUNTER — Ambulatory Visit (INDEPENDENT_AMBULATORY_CARE_PROVIDER_SITE_OTHER): Payer: Medicare Other | Admitting: Internal Medicine

## 2011-04-09 ENCOUNTER — Encounter: Payer: Self-pay | Admitting: Internal Medicine

## 2011-04-09 DIAGNOSIS — Z0000 Encounter for general adult medical examination without abnormal findings: Secondary | ICD-10-CM

## 2011-04-09 DIAGNOSIS — Z23 Encounter for immunization: Secondary | ICD-10-CM

## 2011-04-09 DIAGNOSIS — I10 Essential (primary) hypertension: Secondary | ICD-10-CM

## 2011-04-09 DIAGNOSIS — E039 Hypothyroidism, unspecified: Secondary | ICD-10-CM

## 2011-04-09 DIAGNOSIS — E785 Hyperlipidemia, unspecified: Secondary | ICD-10-CM

## 2011-04-09 DIAGNOSIS — M069 Rheumatoid arthritis, unspecified: Secondary | ICD-10-CM

## 2011-04-09 LAB — CBC WITH DIFFERENTIAL/PLATELET
Basophils Absolute: 0.1 10*3/uL (ref 0.0–0.1)
Hemoglobin: 12.3 g/dL (ref 12.0–15.0)
Lymphocytes Relative: 31.7 % (ref 12.0–46.0)
Monocytes Relative: 11.6 % (ref 3.0–12.0)
Platelets: 289 10*3/uL (ref 150.0–400.0)
RDW: 17.5 % — ABNORMAL HIGH (ref 11.5–14.6)

## 2011-04-09 LAB — BASIC METABOLIC PANEL
BUN: 16 mg/dL (ref 6–23)
Calcium: 9.9 mg/dL (ref 8.4–10.5)
GFR: 53.82 mL/min — ABNORMAL LOW (ref >60.00)
Glucose, Bld: 103 mg/dL — ABNORMAL HIGH (ref 70–99)
Sodium: 142 mEq/L (ref 135–145)

## 2011-04-09 LAB — LIPID PANEL
HDL: 58.6 mg/dL (ref >39.00)
Triglycerides: 176 mg/dL — ABNORMAL HIGH (ref 0.0–149.0)

## 2011-04-09 LAB — HEPATIC FUNCTION PANEL
Bilirubin, Direct: 0 mg/dL (ref 0.0–0.3)
Total Protein: 7.9 g/dL (ref 6.0–8.3)

## 2011-04-09 LAB — TSH: TSH: 1.78 u[IU]/mL (ref 0.35–5.50)

## 2011-04-09 MED ORDER — SIMVASTATIN 80 MG PO TABS: 80.0000 mg | ORAL_TABLET | Freq: Every day | ORAL | Status: DC

## 2011-04-09 MED ORDER — LEVOTHYROXINE SODIUM 50 MCG PO TABS: 50.0000 ug | ORAL_TABLET | Freq: Every day | ORAL | Status: DC

## 2011-04-09 MED ORDER — BENAZEPRIL-HYDROCHLOROTHIAZIDE 10-12.5 MG PO TABS: 1.0000 | ORAL_TABLET | Freq: Every day | ORAL | Status: DC

## 2011-04-09 NOTE — Patient Instructions (Signed)
Limit your sodium (Salt) intake  Please check your blood pressure on a regular basis.  If it is consistently greater than 150/90, please make an office appointment.  Return in 6 months for follow-up  Take a calcium supplement, plus 800-1200 units of vitamin D 

## 2011-04-09 NOTE — Progress Notes (Signed)
Subjective:    Patient ID: [name] [name], female    DOB: 1935-06-01, 76 y.o.   MRN: [id number]  HPI  76 year old patient who is seen today for an annual physical. She is followed by rheumatology for RA which has been stable. She remains on methotrexate therapy. She has treated hypertension and dyslipidemia which have been well controlled she remains on simvastatin. She has hypothyroidism controlled on supplemental levothyroxine. She is doing quite well today. In May she underwent C-spine surgery for spurs in her radiculopathy has nicely resolved.   Allergies:  1) Codeine Phosphate (Codeine Phosphate)   Past History:  Past Medical History:  Hyperlipidemia  Hypertension  Hypothyroidism  Rheumatoid arthritis 2002  Menopausal syndrome   Past Surgical History:  Reviewed history from 01/05/2009 and no changes required.  Appendectomy  Cholecystectomy laparoscopic 2002  Hysterectomy age 50  colonoscopy in May 2009 C-spine surgery May 2012 4 left-sided radiculopathy ([place])   Family History:  Reviewed history from 11/19/2007 and no changes required.  father died at age 72, MI, history of diabetes  mother died age 39, status post hip fracture  grandparents, strongly positive for diabetes  Six brothers 3 sisters  three brothers deceased, age 78 , 37, MI; one brother had prostate cancer, age 70  For prostate cancer or heart disease and cerebrovascular disease   Social History:  Reviewed history and no changes required.   1. Risk factors, based on past  M,S,F history-  cardiovascular risk factors include hypertension dyslipidemia and a family history of coronary artery disease  2.  Physical activities: Remains fairly active her rheumatoid arthritis is well controlled  3.  Depression/mood: No history of depression or mood disorder  4.  Hearing: No deficits  5.  ADL's: Independent in all aspects of daily living  6.  Fall risk: Low  7.  Home safety: No problems identified  8.   Height weight, and visual acuity; height and weight are stable no change in visual acuity  9.  Counseling: calcium and vitamin D supplementation encouraged regular exercise regimen recommended  10. Lab orders based on risk factors: Laboratory profile including lipid panel and TSH will be reviewed  11. Referral :  Followup dermatology. Is scheduled for mammogram tomorrow  12. Care plan: Continue restricted salt diet home blood pressure monitoring  13. Cognitive assessment: Alert and oriented with normal affect. No cognitive dysfunction       Review of Systems  Constitutional: Negative for fever, appetite change, fatigue and unexpected weight change.  HENT: Negative for hearing loss, ear pain, nosebleeds, congestion, sore throat, mouth sores, trouble swallowing, neck stiffness, dental problem, voice change, sinus pressure and tinnitus.   Eyes: Negative for photophobia, pain, redness and visual disturbance.  Respiratory: Negative for cough, chest tightness and shortness of breath.   Cardiovascular: Negative for chest pain, palpitations and leg swelling.  Gastrointestinal: Negative for nausea, vomiting, abdominal pain, diarrhea, constipation, blood in stool, abdominal distention and rectal pain.  Genitourinary: Negative for dysuria, urgency, frequency, hematuria, flank pain, vaginal bleeding, vaginal discharge, difficulty urinating, genital sores, vaginal pain, menstrual problem and pelvic pain.  Musculoskeletal: Negative for back pain and arthralgias.  Skin: Negative for rash.  Neurological: Negative for dizziness, syncope, speech difficulty, weakness, light-headedness, numbness and headaches.  Hematological: Negative for adenopathy. Does not bruise/bleed easily.  Psychiatric/Behavioral: Negative for suicidal ideas, behavioral problems, self-injury, dysphoric mood and agitation. The patient is not nervous/anxious.        Objective:   Physical Exam  Constitutional: She  is oriented to  person, place, and time. She appears well-developed and well-nourished. No distress.       Blood pressure low normal  HENT:  Head: Normocephalic and atraumatic.  Right Ear: External ear normal.  Left Ear: External ear normal.  Mouth/Throat: Oropharynx is clear and moist.  Eyes: Conjunctivae and EOM are normal.  Neck: Normal range of motion. Neck supple. No JVD present. No thyromegaly present.  Cardiovascular: Normal rate, regular rhythm, normal heart sounds and intact distal pulses.   No murmur heard. Pulmonary/Chest: Effort normal and breath sounds normal. She has no wheezes. She has no rales.  Abdominal: Soft. Bowel sounds are normal. She exhibits no distension and no mass. There is no tenderness. There is no rebound and no guarding.  Musculoskeletal: Normal range of motion. She exhibits no edema and no tenderness.  Neurological: She is alert and oriented to person, place, and time. She has normal reflexes. No cranial nerve deficit. She exhibits normal muscle tone. Coordination normal.  Skin: Skin is warm and dry. No rash noted.  Psychiatric: She has a normal mood and affect. Her behavior is normal.          Assessment & Plan:   Preventive health examination Hypertension stable Dyslipidemia. We'll check lipid profile Hypothyroidism. We'll check TSH Rheumatoid arthritis stable. Followup rheumatology

## 2011-04-10 NOTE — Progress Notes (Signed)
Quick Note:  Attempt to call- ans mach - LMTCB if questions - gave results and stressed med and diet. KIK ______

## 2011-04-17 LAB — HM MAMMOGRAPHY

## 2011-04-18 ENCOUNTER — Encounter: Payer: Self-pay | Admitting: Internal Medicine

## 2011-04-24 ENCOUNTER — Telehealth: Payer: Self-pay | Admitting: Internal Medicine

## 2011-04-24 MED ORDER — HYDROCODONE-HOMATROPINE 5-1.5 MG/5ML PO SYRP: 5.0000 mL | ORAL_SOLUTION | Freq: Four times a day (QID) | ORAL | Status: AC | PRN

## 2011-04-24 NOTE — Telephone Encounter (Signed)
Please advise 

## 2011-04-24 NOTE — Telephone Encounter (Signed)
Generic Hydromet 6 ounces 1 teaspoon every 6 hours as needed for cough or congestion 

## 2011-04-24 NOTE — Telephone Encounter (Signed)
Called in.

## 2011-04-24 NOTE — Telephone Encounter (Signed)
Pt called and has cough and chest congestion. Pt has been taking Robitussin otc, but it is not helping. Pt req cough med to be called in to [facility].

## 2011-04-25 ENCOUNTER — Encounter: Payer: Self-pay | Admitting: Family Medicine

## 2011-04-25 ENCOUNTER — Ambulatory Visit (INDEPENDENT_AMBULATORY_CARE_PROVIDER_SITE_OTHER): Payer: Medicare Other | Admitting: Family Medicine

## 2011-04-25 VITALS — BP 112/66 | HR 101 | Temp 98.2°F | Wt 140.0 lb

## 2011-04-25 DIAGNOSIS — J40 Bronchitis, not specified as acute or chronic: Secondary | ICD-10-CM

## 2011-04-25 MED ORDER — AZITHROMYCIN 250 MG PO TABS: ORAL_TABLET | ORAL | Status: AC

## 2011-04-25 MED ORDER — BENZONATATE 100 MG PO CAPS: 100.0000 mg | ORAL_CAPSULE | Freq: Four times a day (QID) | ORAL | Status: DC | PRN

## 2011-04-25 NOTE — Progress Notes (Signed)
  Subjective:    Patient ID: [name] [name], female    DOB: 05-Feb-1935, 76 y.o.   MRN: [id number]  HPI Here for 2 weeks of chest congestion and a dry cough. No SOB or chest pain. She had fever the first few days but none since then. No ST or sinus symptoms. She tried Hydromet syrup that was called in for her, but this causes her to be dizzy and nauseated.    Review of Systems  Constitutional: Positive for fever.  HENT: Negative.   Eyes: Negative.   Respiratory: Positive for cough.   Cardiovascular: Negative.        Objective:   Physical Exam  Constitutional: She appears well-developed and well-nourished.  HENT:  Right Ear: External ear normal.  Left Ear: External ear normal.  Nose: Nose normal.  Mouth/Throat: Oropharynx is clear and moist. No oropharyngeal exudate.  Eyes: Conjunctivae are normal. Pupils are equal, round, and reactive to light.  Neck: Neck supple. No thyromegaly present.  Pulmonary/Chest: Effort normal. No respiratory distress. She has no wheezes. She has no rales. She exhibits no tenderness.       Scattered rhonchi   Lymphadenopathy:    She has no cervical adenopathy.          Assessment & Plan:  Try Tessalon Perles. Drink plenty of water

## 2011-06-05 ENCOUNTER — Other Ambulatory Visit: Payer: Self-pay | Admitting: Neurosurgery

## 2011-06-05 ENCOUNTER — Ambulatory Visit
Admission: RE | Admit: 2011-06-05 | Discharge: 2011-06-05 | Disposition: A | Discharge status: Home / Self Care | Payer: Medicare Other | Source: Ambulatory Visit | Attending: Neurosurgery | Admitting: Neurosurgery

## 2011-06-05 DIAGNOSIS — M542 Cervicalgia: Secondary | ICD-10-CM

## 2011-10-08 ENCOUNTER — Ambulatory Visit: Payer: Medicare Other | Admitting: Internal Medicine

## 2011-10-16 ENCOUNTER — Ambulatory Visit: Payer: Medicare Other | Admitting: Internal Medicine

## 2011-10-21 LAB — HM MAMMOGRAPHY

## 2011-10-22 ENCOUNTER — Encounter: Payer: Self-pay | Admitting: Internal Medicine

## 2011-10-29 ENCOUNTER — Encounter: Payer: Self-pay | Admitting: Internal Medicine

## 2011-11-11 ENCOUNTER — Encounter: Payer: Self-pay | Admitting: Internal Medicine

## 2011-11-11 ENCOUNTER — Ambulatory Visit (INDEPENDENT_AMBULATORY_CARE_PROVIDER_SITE_OTHER): Payer: Medicare Other | Admitting: Internal Medicine

## 2011-11-11 VITALS — BP 120/80 | Temp 98.1°F | Wt 152.0 lb

## 2011-11-11 DIAGNOSIS — I10 Essential (primary) hypertension: Secondary | ICD-10-CM

## 2011-11-11 DIAGNOSIS — M069 Rheumatoid arthritis, unspecified: Secondary | ICD-10-CM

## 2011-11-11 DIAGNOSIS — E039 Hypothyroidism, unspecified: Secondary | ICD-10-CM

## 2011-11-11 DIAGNOSIS — E785 Hyperlipidemia, unspecified: Secondary | ICD-10-CM

## 2011-11-11 MED ORDER — BENAZEPRIL-HYDROCHLOROTHIAZIDE 10-12.5 MG PO TABS: 1.0000 | ORAL_TABLET | Freq: Every day | ORAL | Status: DC

## 2011-11-11 MED ORDER — LEVOTHYROXINE SODIUM 50 MCG PO TABS: 50.0000 ug | ORAL_TABLET | Freq: Every day | ORAL | Status: DC

## 2011-11-11 MED ORDER — ATORVASTATIN CALCIUM 80 MG PO TABS: 80.0000 mg | ORAL_TABLET | Freq: Every day | ORAL | Status: DC

## 2011-11-11 NOTE — Progress Notes (Signed)
  Subjective:    Patient ID: [name] [name], female    DOB: 1935-06-28, 77 y.o.   MRN: [id number]  HPI  77 year old patient who is seen today for her six-month followup. Medical problems include dyslipidemia. She has been on high-dose simvastatin 80 mg for some time. Lipid profile from 6 months ago was reviewed and cholesterol was still quite elevated. She states that she has been compliant with this medication. She has rheumatoid arthritis and methotrexate therapy has been down titrated  From 8 tablets weekly to 6 tablets weekly. Her arthritis has been stable and she is followed by rheumatology She has hypothyroidism on supplemental Synthroid TSH 6 months ago was controlled. She has treated hypertension on dual therapy blood pressure remains well-controlled denies any cough or other side effects. Denies any cardiopulmonary complaints   Review of Systems  Constitutional: Negative.   HENT: Negative for hearing loss, congestion, sore throat, rhinorrhea, dental problem, sinus pressure and tinnitus.   Eyes: Negative for pain, discharge and visual disturbance.  Respiratory: Negative for cough and shortness of breath.   Cardiovascular: Negative for chest pain, palpitations and leg swelling.  Gastrointestinal: Negative for nausea, vomiting, abdominal pain, diarrhea, constipation, blood in stool and abdominal distention.  Genitourinary: Negative for dysuria, urgency, frequency, hematuria, flank pain, vaginal bleeding, vaginal discharge, difficulty urinating, vaginal pain and pelvic pain.  Musculoskeletal: Negative for joint swelling, arthralgias and gait problem.  Skin: Negative for rash.  Neurological: Negative for dizziness, syncope, speech difficulty, weakness, numbness and headaches.  Hematological: Negative for adenopathy.  Psychiatric/Behavioral: Negative for behavioral problems, dysphoric mood and agitation. The patient is not nervous/anxious.        Objective:   Physical Exam    Constitutional: She is oriented to person, place, and time. She appears well-developed and well-nourished.  HENT:  Head: Normocephalic.  Right Ear: External ear normal.  Left Ear: External ear normal.  Mouth/Throat: Oropharynx is clear and moist.  Eyes: Conjunctivae and EOM are normal. Pupils are equal, round, and reactive to light.  Neck: Normal range of motion. Neck supple. No thyromegaly present.  Cardiovascular: Normal rate, regular rhythm and intact distal pulses.   Murmur heard.      Grade 2/6 systolic murmur at the base  Pulmonary/Chest: Effort normal and breath sounds normal.  Abdominal: Soft. Bowel sounds are normal. She exhibits no mass. There is no tenderness.  Musculoskeletal: Normal range of motion.  Lymphadenopathy:    She has no cervical adenopathy.  Neurological: She is alert and oriented to person, place, and time.  Skin: Skin is warm and dry. No rash noted.  Psychiatric: She has a normal mood and affect. Her behavior is normal.          Assessment & Plan:   Hypertension well controlled we'll continue present regimen Dyslipidemia. We'll discontinue simvastatin and start atorvastatin 80 mg daily Hypothyroidism levothyroxine was refilled Rheumatoid arthritis stable  See in 6 months for an annual exam lipid profile and TSH at that time

## 2011-11-11 NOTE — Patient Instructions (Signed)
Limit your sodium (Salt) intake    It is important that you exercise regularly, at least 20 minutes 3 to 4 times per week.  If you develop chest pain or shortness of breath seek  medical attention.  Please check your blood pressure on a regular basis.  If it is consistently greater than 150/90, please make an office appointment.  Return in 6 months for follow-up   

## 2011-12-10 ENCOUNTER — Other Ambulatory Visit: Payer: Self-pay | Admitting: Internal Medicine

## 2012-05-13 ENCOUNTER — Encounter: Payer: Medicare Other | Admitting: Internal Medicine

## 2012-05-24 ENCOUNTER — Other Ambulatory Visit: Payer: Self-pay | Admitting: Internal Medicine

## 2012-06-21 ENCOUNTER — Encounter: Payer: Self-pay | Admitting: Internal Medicine

## 2012-06-21 ENCOUNTER — Ambulatory Visit (INDEPENDENT_AMBULATORY_CARE_PROVIDER_SITE_OTHER): Payer: Medicare Other | Admitting: Internal Medicine

## 2012-06-21 VITALS — BP 124/80 | HR 87 | Temp 98.2°F | Resp 16 | Ht 61.0 in | Wt 150.0 lb

## 2012-06-21 DIAGNOSIS — I10 Essential (primary) hypertension: Secondary | ICD-10-CM

## 2012-06-21 DIAGNOSIS — E039 Hypothyroidism, unspecified: Secondary | ICD-10-CM

## 2012-06-21 DIAGNOSIS — Z23 Encounter for immunization: Secondary | ICD-10-CM

## 2012-06-21 DIAGNOSIS — E785 Hyperlipidemia, unspecified: Secondary | ICD-10-CM

## 2012-06-21 DIAGNOSIS — M069 Rheumatoid arthritis, unspecified: Secondary | ICD-10-CM

## 2012-06-21 DIAGNOSIS — Z0000 Encounter for general adult medical examination without abnormal findings: Secondary | ICD-10-CM

## 2012-06-21 MED ORDER — LEVOTHYROXINE SODIUM 50 MCG PO TABS: 50.0000 ug | ORAL_TABLET | Freq: Every day | ORAL | Status: DC

## 2012-06-21 MED ORDER — ATORVASTATIN CALCIUM 80 MG PO TABS: 80.0000 mg | ORAL_TABLET | Freq: Every day | ORAL | Status: DC

## 2012-06-21 MED ORDER — BENAZEPRIL-HYDROCHLOROTHIAZIDE 10-12.5 MG PO TABS: 1.0000 | ORAL_TABLET | Freq: Every day | ORAL | Status: DC

## 2012-06-21 NOTE — Patient Instructions (Addendum)
Limit your sodium (Salt) intake    It is important that you exercise regularly, at least 20 minutes 3 to 4 times per week.  If you develop chest pain or shortness of breath seek  medical attention.  Followup rheumatology  Please check your blood pressure on a regular basis.  If it is consistently greater than 150/90, please make an office appointment.  Take a calcium supplement, plus [phone] units of vitamin D  Return in one year for follow-up

## 2012-06-21 NOTE — Progress Notes (Signed)
Patient ID: [name] [name], female   DOB: [birth year], 77 y.o.   MRN: [id number]  Subjective:    Patient ID: [name] [name], female    DOB: [birth year], 77 y.o.   MRN: [id number]  Hypertension Associated symptoms include headaches (patient has experienced some headache since starting Plaquenil therapy. These have resolved since brief discontinuation). Pertinent negatives include no chest pain, palpitations or shortness of breath.  Hyperlipidemia Pertinent negatives include no chest pain or shortness of breath.  4 -year-old patient who is seen today for an annual physical. She is followed by rheumatology for RA which has been stable. She remains on methotrexate therapy. She has treated hypertension and dyslipidemia which have been well controlled she remains on simvastatin. She has hypothyroidism controlled on supplemental levothyroxine. She is doing quite well today. In May she underwent C-spine surgery for spurs in her radiculopathy has nicely resolved. Since her last visit here she has been placed on Plaquenil by rheumatology and they are requesting a Pneumovax booster. She also has seen Dr. Luciana Axe for a retinal surgery in October of this year   Allergies:  1) Codeine Phosphate (Codeine Phosphate)   Past History:  Past Medical History:  Hyperlipidemia  Hypertension  Hypothyroidism  Rheumatoid arthritis 2002  Menopausal syndrome   Past Surgical History:  Reviewed history from 01/05/2009 and no changes required.  Appendectomy  Cholecystectomy laparoscopic 2002  Hysterectomy age 60  colonoscopy in May 2009 C-spine surgery May 2012 4 left-sided radiculopathy ([facility]) Retinal surgery October 2013 ([facility])   Family History:  Reviewed history from 11/19/2007 and no changes required.  father died at age 83, MI, history of diabetes  mother died age 16, status post hip fracture  grandparents, strongly positive for diabetes  Six brothers 3 sisters  three brothers deceased, age 58 , 63,  MI; one brother had prostate cancer, age 59  For prostate cancer or heart disease and cerebrovascular disease   Social History:  Reviewed history and no changes required.   1. Risk factors, based on past  M,S,F history-  cardiovascular risk factors include hypertension dyslipidemia and a family history of coronary artery disease  2.  Physical activities: Remains fairly active her rheumatoid arthritis is well controlled  3.  Depression/mood: No history of depression or mood disorder  4.  Hearing: No deficits  5.  ADL's: Independent in all aspects of daily living  6.  Fall risk: Low  7.  Home safety: No problems identified  8.  Height weight, and visual acuity; height and weight are stable no change in visual acuity  9.  Counseling: calcium and vitamin D supplementation encouraged regular exercise regimen recommended  10. Lab orders based on risk factors: Laboratory profile including lipid panel and TSH will be reviewed  11. Referral :  Followup dermatology. Is scheduled for mammogram tomorrow  12. Care plan: Continue restricted salt diet home blood pressure monitoring  13. Cognitive assessment: Alert and oriented with normal affect. No cognitive dysfunction   Past Medical History  Diagnosis Date  . HYPERLIPIDEMIA 01/19/2007  . HYPERTENSION 01/19/2007  . HYPOTHYROIDISM 01/19/2007  . Rheumatoid arthritis 01/19/2007    History   Social History  . Marital Status: Married    Spouse Name: N/A    Number of Children: N/A  . Years of Education: N/A   Occupational History  . Not on file.   Social History Main Topics  . Smoking status: Never Smoker   . Smokeless tobacco: Never Used  . Alcohol Use: No  .  Drug Use: No  . Sexually Active: Not on file   Other Topics Concern  . Not on file   Social History Narrative  . No narrative on file    Past Surgical History  Procedure Date  . Appendectomy   . Cholecystectomy   . Abdominal hysterectomy     No family history  on file.  Allergies  Allergen Reactions  . Codeine Phosphate     REACTION: unspecified    Current Outpatient Prescriptions on File Prior to Visit  Medication Sig Dispense Refill  . atorvastatin (LIPITOR) 80 MG tablet Take 1 tablet (80 mg total) by mouth daily.  90 tablet  3  . benazepril-hydrochlorthiazide (LOTENSIN HCT) 10-12.5 MG per tablet TAKE 1 TABLET BY MOUTH DAILY.  90 tablet  0  . folic acid (FOLVITE) 1 MG tablet Take 1 mg by mouth daily.        Marland Kitchen levothyroxine (SYNTHROID, LEVOTHROID) 50 MCG tablet Take 1 tablet (50 mcg total) by mouth daily.  90 tablet  6  . levothyroxine (SYNTHROID, LEVOTHROID) 50 MCG tablet TAKE 1 TABLET (50 MCG TOTAL) BY MOUTH DAILY.  90 tablet  3  . methotrexate 2.5 MG tablet Take by mouth. 6 tabs weekly         BP 124/80  Pulse 87  Temp 98.2 F (36.8 C) (Oral)  Resp 16  Ht 5\' 1"  (1.549 m)  Wt 150 lb (68.04 kg)  BMI 28.34 kg/m2  SpO2 98%      Review of Systems  Constitutional: Negative for fever, appetite change, fatigue and unexpected weight change.  HENT: Negative for hearing loss, ear pain, nosebleeds, congestion, sore throat, mouth sores, trouble swallowing, neck stiffness, dental problem, voice change, sinus pressure and tinnitus.   Eyes: Negative for photophobia, pain, redness and visual disturbance.  Respiratory: Negative for cough, chest tightness and shortness of breath.   Cardiovascular: Negative for chest pain, palpitations and leg swelling.  Gastrointestinal: Negative for nausea, vomiting, abdominal pain, diarrhea, constipation, blood in stool, abdominal distention and rectal pain.  Genitourinary: Negative for dysuria, urgency, frequency, hematuria, flank pain, vaginal bleeding, vaginal discharge, difficulty urinating, genital sores, vaginal pain, menstrual problem and pelvic pain.  Musculoskeletal: Negative for back pain and arthralgias.  Skin: Negative for rash.  Neurological: Positive for headaches (patient has experienced some  headache since starting Plaquenil therapy. These have resolved since brief discontinuation). Negative for dizziness, syncope, speech difficulty, weakness, light-headedness and numbness.  Hematological: Negative for adenopathy. Does not bruise/bleed easily.  Psychiatric/Behavioral: Negative for suicidal ideas, behavioral problems, self-injury, dysphoric mood and agitation. The patient is not nervous/anxious.        Objective:   Physical Exam  Constitutional: She is oriented to person, place, and time. She appears well-developed and well-nourished. No distress.       Blood pressure low normal  HENT:  Head: Normocephalic and atraumatic.  Right Ear: External ear normal.  Left Ear: External ear normal.  Mouth/Throat: Oropharynx is clear and moist.  Eyes: Conjunctivae normal and EOM are normal.  Neck: Normal range of motion. Neck supple. No JVD present. No thyromegaly present.  Cardiovascular: Normal rate, regular rhythm, normal heart sounds and intact distal pulses.   No murmur heard. Pulmonary/Chest: Effort normal and breath sounds normal. She has no wheezes. She has no rales.  Abdominal: Soft. Bowel sounds are normal. She exhibits no distension and no mass. There is no tenderness. There is no rebound and no guarding.  Musculoskeletal: Normal range of motion. She exhibits no edema  and no tenderness.  Neurological: She is alert and oriented to person, place, and time. She has normal reflexes. No cranial nerve deficit. She exhibits normal muscle tone. Coordination normal.  Skin: Skin is warm and dry. No rash noted.  Psychiatric: She has a normal mood and affect. Her behavior is normal.          Assessment & Plan:   Preventive health examination Hypertension stable Dyslipidemia. We'll check lipid profile Hypothyroidism. We'll check TSH Rheumatoid arthritis stable. Followup rheumatology

## 2012-06-21 NOTE — Progress Notes (Deleted)
  Subjective:    Patient ID: [name] [name], female    DOB: 1935/06/02, 77 y.o.   MRN: [id number]  HPI    Review of Systems     Objective:   Physical Exam        Assessment & Plan:

## 2012-07-22 ENCOUNTER — Encounter: Payer: Medicare Other | Admitting: Internal Medicine

## 2012-09-18 ENCOUNTER — Other Ambulatory Visit: Payer: Self-pay | Admitting: Internal Medicine

## 2012-10-01 ENCOUNTER — Other Ambulatory Visit: Payer: Self-pay | Admitting: Internal Medicine

## 2012-10-13 ENCOUNTER — Telehealth: Payer: Self-pay | Admitting: Internal Medicine

## 2012-10-13 NOTE — Telephone Encounter (Signed)
Patient Information:  Caller Name: Holley  Phone: [phone]  Patient: [name] [name]  Gender: Female  DOB: 02-Sep-1934  Age: 78 Years  PCP: Eleonore Chiquito [facility])  Office Follow Up:  Does the office need to follow up with this patient?: No  Instructions For The Office: N/A   Symptoms  Reason For Call & Symptoms: Started intermittent Right shoulder blade pain on Fri 3/7 and gradually gotten worse.  Has pain that goes down Right arm to elbow at intervals.  Really severe pain causing nausea yesterday 3/11.  Today can sit and relax and it eases off, can do usual activites but lot of throbbing pain, now 6-7/10.  Hx of exercise 5 days a week with 5 pound weight but not aware of injury - stopped exercise when pain started.  Reviewed Health History In EMR: Yes  Reviewed Medications In EMR: Yes  Reviewed Allergies In EMR: Yes  Reviewed Surgeries / Procedures: Yes  Date of Onset of Symptoms: 10/08/2012  Treatments Tried: heating pad helped while resting  Treatments Tried Worked: Yes  Guideline(s) Used:  Back Pain  Disposition Per Guideline:   See Today or Tomorrow in Office  Reason For Disposition Reached:   Patient wants to be seen  Advice Given:  Cold or Heat:  Heat Pack: If pain lasts over 2 days, apply heat to the sore area. Use a heat pack, heating pad, or warm wet washcloth. Do this for 10 minutes, then as needed. For widespread stiffness, take a hot bath or hot shower instead. Move the sore area under the warm water.  Sleep:  Sleep on your side with a pillow between your knees. If you sleep on your back, put a pillow under your knees.  Avoid sleeping on your stomach.  Your mattress should be firm. Avoid waterbeds.  Activity  Keep doing your day-to-day activities if it is not too painful. Staying active is better than resting.  Avoid anything that makes your pain worse. Avoid heavy lifting, twisting, and too much exercise until your back heals.  Appointment  Scheduled:  10/14/2012 09:15:00 Appointment Scheduled Provider:  Eleonore Chiquito [facility])

## 2012-10-14 ENCOUNTER — Ambulatory Visit (INDEPENDENT_AMBULATORY_CARE_PROVIDER_SITE_OTHER): Payer: Medicare Other | Admitting: Internal Medicine

## 2012-10-14 ENCOUNTER — Encounter: Payer: Self-pay | Admitting: Internal Medicine

## 2012-10-14 VITALS — BP 140/86 | HR 90 | Temp 97.9°F | Resp 20 | Wt 153.0 lb

## 2012-10-14 DIAGNOSIS — M069 Rheumatoid arthritis, unspecified: Secondary | ICD-10-CM

## 2012-10-14 DIAGNOSIS — M25529 Pain in unspecified elbow: Secondary | ICD-10-CM

## 2012-10-14 DIAGNOSIS — I10 Essential (primary) hypertension: Secondary | ICD-10-CM

## 2012-10-14 DIAGNOSIS — M25521 Pain in right elbow: Secondary | ICD-10-CM

## 2012-10-14 MED ORDER — TRAMADOL HCL 50 MG PO TABS: 50.0000 mg | ORAL_TABLET | Freq: Three times a day (TID) | ORAL | Status: DC | PRN

## 2012-10-14 MED ORDER — METHYLPREDNISOLONE ACETATE 80 MG/ML IJ SUSP
80.0000 mg | Freq: Once | INTRAMUSCULAR | Status: AC
  Administered 2012-10-14: 80 mg via INTRAMUSCULAR

## 2012-10-14 NOTE — Patient Instructions (Signed)
Call or return to clinic prn if these symptoms worsen or fail to improve as anticipated.

## 2012-10-14 NOTE — Progress Notes (Signed)
Subjective:    Patient ID: [name] [name], female    DOB: April 26, 1935, 78 y.o.   MRN: [id number]  HPI  78 year old patient who presents with a one-week history of pain in the right interscapular area and also involving the right upper arm.  No real aggravating factors. Does not seem to be aggravated by movement of the head neck or shoulder area. Denies any motor weakness. The patient is status post anterior cervical discectomy and fusion of C5-T1 due  to spondylosis with spinal stenosis and  cord compression. She also had a left C8 radiculopathy at that time.  This was performed approximately 2 years ago  Past Medical History  Diagnosis Date  . HYPERLIPIDEMIA 01/19/2007  . HYPERTENSION 01/19/2007  . HYPOTHYROIDISM 01/19/2007  . Rheumatoid arthritis 01/19/2007    History   Social History  . Marital Status: Married    Spouse Name: N/A    Number of Children: N/A  . Years of Education: N/A   Occupational History  . Not on file.   Social History Main Topics  . Smoking status: Never Smoker   . Smokeless tobacco: Never Used  . Alcohol Use: No  . Drug Use: No  . Sexually Active: Not on file   Other Topics Concern  . Not on file   Social History Narrative  . No narrative on file    Past Surgical History  Procedure Laterality Date  . Appendectomy    . Cholecystectomy    . Abdominal hysterectomy      No family history on file.  Allergies  Allergen Reactions  . Codeine Phosphate     REACTION: unspecified    Current Outpatient Prescriptions on File Prior to Visit  Medication Sig Dispense Refill  . atorvastatin (LIPITOR) 80 MG tablet Take 1 tablet (80 mg total) by mouth daily.  90 tablet  3  . benazepril-hydrochlorthiazide (LOTENSIN HCT) 10-12.5 MG per tablet Take 1 tablet by mouth daily.  90 tablet  6  . folic acid (FOLVITE) 1 MG tablet Take 1 mg by mouth daily.        . hydroxychloroquine (PLAQUENIL) 200 MG tablet       . levothyroxine (SYNTHROID, LEVOTHROID) 50 MCG  tablet Take 1 tablet (50 mcg total) by mouth daily.  90 tablet  6  . methotrexate 2.5 MG tablet Take by mouth. 6 tabs weekly        No current facility-administered medications on file prior to visit.    BP 140/86  Pulse 90  Temp(Src) 97.9 F (36.6 C) (Oral)  Resp 20  Wt 153 lb (69.4 kg)  BMI 28.92 kg/m2  SpO2 95%     Review of Systems  Constitutional: Negative.   HENT: Negative for hearing loss, congestion, sore throat, rhinorrhea, dental problem, sinus pressure and tinnitus.   Eyes: Negative for pain, discharge and visual disturbance.  Respiratory: Negative for cough and shortness of breath.   Cardiovascular: Negative for chest pain, palpitations and leg swelling.  Gastrointestinal: Negative for nausea, vomiting, abdominal pain, diarrhea, constipation, blood in stool and abdominal distention.  Genitourinary: Negative for dysuria, urgency, frequency, hematuria, flank pain, vaginal bleeding, vaginal discharge, difficulty urinating, vaginal pain and pelvic pain.  Musculoskeletal: Negative for joint swelling, arthralgias and gait problem.       Pain right back just medial to the scapular border as well as pain in the right upper arm  Skin: Negative for rash.  Neurological: Negative for dizziness, syncope, speech difficulty, weakness, numbness and headaches.  Hematological:  Negative for adenopathy.  Psychiatric/Behavioral: Negative for behavioral problems, dysphoric mood and agitation. The patient is not nervous/anxious.        Objective:   Physical Exam  Constitutional: She appears well-developed and well-nourished. No distress.  Musculoskeletal:  Clinical evaluation for rotator cuff pathology unremarkable Full range of motion of the head and neck  Neurological:  No motor weakness Reflexes blunted but symmetrical          Assessment & Plan:   Right upper back and right arm pain.  Suspect recurrent cervical disc disease. Patient appears to be neurologically intact and  fairly comfortable. We'll treat symptomatically and also Depo-Medrol 80. If she fails to improve or there is any clinical worsening will repeat a cervical MRI Hypertension Rheumatoid arthritis

## 2012-11-01 ENCOUNTER — Ambulatory Visit (INDEPENDENT_AMBULATORY_CARE_PROVIDER_SITE_OTHER): Payer: Medicare Other | Admitting: Internal Medicine

## 2012-11-01 ENCOUNTER — Encounter: Payer: Self-pay | Admitting: Internal Medicine

## 2012-11-01 VITALS — BP 120/76 | HR 80 | Temp 98.0°F | Resp 18 | Wt 150.0 lb

## 2012-11-01 DIAGNOSIS — M25519 Pain in unspecified shoulder: Secondary | ICD-10-CM

## 2012-11-01 DIAGNOSIS — E039 Hypothyroidism, unspecified: Secondary | ICD-10-CM

## 2012-11-01 DIAGNOSIS — M25511 Pain in right shoulder: Secondary | ICD-10-CM

## 2012-11-01 DIAGNOSIS — I10 Essential (primary) hypertension: Secondary | ICD-10-CM

## 2012-11-01 MED ORDER — HYDROCODONE-ACETAMINOPHEN 5-500 MG PO TABS: 1.0000 | ORAL_TABLET | Freq: Three times a day (TID) | ORAL | Status: DC | PRN

## 2012-11-01 NOTE — Patient Instructions (Signed)
Cervical MRI as discussed  Call or return to clinic prn if these symptoms worsen or fail to improve as anticipated.

## 2012-11-01 NOTE — Progress Notes (Signed)
Subjective:    Patient ID: [name] [name], female    DOB: 08-14-1934, 78 y.o.   MRN: [id number]  HPI  78 year old patient who is seen today in followup. She continues to have right interscapular pain associated with right arm pain. The patient was seen 2 weeks ago and has failed to improve. Pain is now  aggravated by head turning to the right and she's also noticed some tingling involving the right arm The patient is status post anterior cervical discectomy and fusion of C5-T1 due to spondylosis with spinal stenosis and cord compression. She also had a left C8 radiculopathy at that time. This was performed approximately 2 years ago  Past Medical History  Diagnosis Date  . HYPERLIPIDEMIA 01/19/2007  . HYPERTENSION 01/19/2007  . HYPOTHYROIDISM 01/19/2007  . Rheumatoid arthritis 01/19/2007    History   Social History  . Marital Status: Married    Spouse Name: N/A    Number of Children: N/A  . Years of Education: N/A   Occupational History  . Not on file.   Social History Main Topics  . Smoking status: Never Smoker   . Smokeless tobacco: Never Used  . Alcohol Use: No  . Drug Use: No  . Sexually Active: Not on file   Other Topics Concern  . Not on file   Social History Narrative  . No narrative on file    Past Surgical History  Procedure Laterality Date  . Appendectomy    . Cholecystectomy    . Abdominal hysterectomy      No family history on file.  Allergies  Allergen Reactions  . Tramadol Nausea Only  . Codeine Phosphate     REACTION: unspecified    Current Outpatient Prescriptions on File Prior to Visit  Medication Sig Dispense Refill  . atorvastatin (LIPITOR) 80 MG tablet Take 1 tablet (80 mg total) by mouth daily.  90 tablet  3  . benazepril-hydrochlorthiazide (LOTENSIN HCT) 10-12.5 MG per tablet Take 1 tablet by mouth daily.  90 tablet  6  . folic acid (FOLVITE) 1 MG tablet Take 1 mg by mouth daily.        . hydroxychloroquine (PLAQUENIL) 200 MG tablet        . levothyroxine (SYNTHROID, LEVOTHROID) 50 MCG tablet Take 1 tablet (50 mcg total) by mouth daily.  90 tablet  6  . methotrexate 2.5 MG tablet Take by mouth. 6 tabs weekly       . traMADol (ULTRAM) 50 MG tablet Take 1 tablet (50 mg total) by mouth every 8 (eight) hours as needed for pain.  60 tablet  4   No current facility-administered medications on file prior to visit.    BP 120/76  Pulse 80  Temp(Src) 98 F (36.7 C) (Oral)  Resp 18  Wt 150 lb (68.04 kg)  BMI 28.36 kg/m2  SpO2 97%     Review of Systems  Constitutional: Negative.   HENT: Negative for hearing loss, congestion, sore throat, rhinorrhea, dental problem, sinus pressure and tinnitus.   Eyes: Negative for pain, discharge and visual disturbance.  Respiratory: Negative for cough and shortness of breath.   Cardiovascular: Negative for chest pain, palpitations and leg swelling.  Gastrointestinal: Negative for nausea, vomiting, abdominal pain, diarrhea, constipation, blood in stool and abdominal distention.  Genitourinary: Negative for dysuria, urgency, frequency, hematuria, flank pain, vaginal bleeding, vaginal discharge, difficulty urinating, vaginal pain and pelvic pain.  Musculoskeletal: Positive for back pain and arthralgias. Negative for joint swelling and gait problem.  Skin: Negative for rash.  Neurological: Positive for numbness. Negative for dizziness, syncope, speech difficulty, weakness and headaches.  Hematological: Negative for adenopathy.  Psychiatric/Behavioral: Negative for behavioral problems, dysphoric mood and agitation. The patient is not nervous/anxious.        Objective:   Physical Exam  Constitutional: She is oriented to person, place, and time. She appears well-developed and well-nourished.  HENT:  Head: Normocephalic.  Right Ear: External ear normal.  Left Ear: External ear normal.  Mouth/Throat: Oropharynx is clear and moist.  Eyes: Conjunctivae and EOM are normal. Pupils are equal,  round, and reactive to light.  Neck: Normal range of motion. Neck supple. No thyromegaly present.  Mild diminished range of motion to the right that did aggravate her right arm discomfort  Cardiovascular: Normal rate, regular rhythm, normal heart sounds and intact distal pulses.   Pulmonary/Chest: Effort normal and breath sounds normal.  Abdominal: Soft. Bowel sounds are normal. She exhibits no mass. There is no tenderness.  Musculoskeletal: Normal range of motion.  Lymphadenopathy:    She has no cervical adenopathy.  Neurological: She is alert and oriented to person, place, and time.  Grip strength remains normal  Reflexes intact Arm flexion and extension normal  Skin: Skin is warm and dry. No rash noted.  Psychiatric: She has a normal mood and affect. Her behavior is normal.          Assessment & Plan:   Persistent right upper back and arm pain. Now with paresthesias of the right arm. We'll proceed with the cervical MRI. Tramadol has caused nausea and the patient does have a history of nausea with codeine but has not tried hydrocodone. The patient wishes to give this a trial due to her discomfort. Hypertension stable

## 2012-11-02 ENCOUNTER — Telehealth: Payer: Self-pay | Admitting: Internal Medicine

## 2012-11-02 MED ORDER — HYDROCODONE-ACETAMINOPHEN 5-325 MG PO TABS: 1.0000 | ORAL_TABLET | Freq: Four times a day (QID) | ORAL | Status: DC | PRN

## 2012-11-02 NOTE — Telephone Encounter (Signed)
Pharmacy called and Hydrocodone dosage changed.

## 2012-11-02 NOTE — Telephone Encounter (Signed)
[name] [name] called and stated that the pt brought in a RX for HYDROcodone-acetaminophen (VICODIN) 5-500 MG per tablet, this strength is no longer available and needs to be changed. Please call to change this at [phone]

## 2012-11-06 ENCOUNTER — Ambulatory Visit
Admission: RE | Admit: 2012-11-06 | Discharge: 2012-11-06 | Disposition: A | Discharge status: Home / Self Care | Payer: Medicare Other | Source: Ambulatory Visit | Attending: Internal Medicine | Admitting: Internal Medicine

## 2012-11-06 DIAGNOSIS — M25511 Pain in right shoulder: Secondary | ICD-10-CM

## 2012-11-08 ENCOUNTER — Other Ambulatory Visit: Payer: Self-pay | Admitting: Internal Medicine

## 2012-11-08 DIAGNOSIS — M5030 Other cervical disc degeneration, unspecified cervical region: Secondary | ICD-10-CM

## 2012-11-08 DIAGNOSIS — M47812 Spondylosis without myelopathy or radiculopathy, cervical region: Secondary | ICD-10-CM

## 2012-11-30 ENCOUNTER — Ambulatory Visit: Payer: Medicare Other | Attending: Neurosurgery | Admitting: Rehabilitation

## 2012-11-30 DIAGNOSIS — M545 Low back pain, unspecified: Secondary | ICD-10-CM | POA: Insufficient documentation

## 2012-11-30 DIAGNOSIS — IMO0001 Reserved for inherently not codable concepts without codable children: Secondary | ICD-10-CM | POA: Insufficient documentation

## 2012-12-07 ENCOUNTER — Ambulatory Visit: Payer: Medicare Other | Admitting: Physical Therapy

## 2012-12-07 ENCOUNTER — Ambulatory Visit: Payer: Medicare Other | Attending: Neurosurgery | Admitting: Physical Therapy

## 2012-12-07 DIAGNOSIS — M545 Low back pain, unspecified: Secondary | ICD-10-CM | POA: Insufficient documentation

## 2012-12-07 DIAGNOSIS — IMO0001 Reserved for inherently not codable concepts without codable children: Secondary | ICD-10-CM | POA: Insufficient documentation

## 2012-12-09 ENCOUNTER — Ambulatory Visit: Payer: Medicare Other | Admitting: Physical Therapy

## 2012-12-10 ENCOUNTER — Ambulatory Visit: Payer: Medicare Other | Admitting: Physical Therapy

## 2012-12-14 ENCOUNTER — Ambulatory Visit: Payer: Medicare Other | Admitting: Physical Therapy

## 2012-12-15 ENCOUNTER — Other Ambulatory Visit: Payer: Self-pay | Admitting: Internal Medicine

## 2012-12-16 ENCOUNTER — Ambulatory Visit: Payer: Medicare Other | Admitting: Physical Therapy

## 2013-01-18 ENCOUNTER — Ambulatory Visit: Payer: Medicare Other | Admitting: Internal Medicine

## 2013-01-28 ENCOUNTER — Other Ambulatory Visit: Payer: Self-pay | Admitting: Internal Medicine

## 2013-01-30 ENCOUNTER — Encounter (HOSPITAL_BASED_OUTPATIENT_CLINIC_OR_DEPARTMENT_OTHER): Payer: Self-pay | Admitting: *Deleted

## 2013-01-30 ENCOUNTER — Emergency Department (HOSPITAL_BASED_OUTPATIENT_CLINIC_OR_DEPARTMENT_OTHER): Payer: Medicare Other

## 2013-01-30 ENCOUNTER — Observation Stay (HOSPITAL_BASED_OUTPATIENT_CLINIC_OR_DEPARTMENT_OTHER)
Admission: EM | Admit: 2013-01-30 | Discharge: 2013-02-01 | Disposition: A | Discharge status: Home / Self Care | Payer: Medicare Other | Attending: Family Medicine | Admitting: Family Medicine

## 2013-01-30 DIAGNOSIS — T451X5A Adverse effect of antineoplastic and immunosuppressive drugs, initial encounter: Secondary | ICD-10-CM

## 2013-01-30 DIAGNOSIS — J984 Other disorders of lung: Secondary | ICD-10-CM

## 2013-01-30 DIAGNOSIS — I509 Heart failure, unspecified: Principal | ICD-10-CM

## 2013-01-30 DIAGNOSIS — Z79899 Other long term (current) drug therapy: Secondary | ICD-10-CM | POA: Insufficient documentation

## 2013-01-30 DIAGNOSIS — R079 Chest pain, unspecified: Secondary | ICD-10-CM

## 2013-01-30 DIAGNOSIS — I10 Essential (primary) hypertension: Secondary | ICD-10-CM | POA: Diagnosis present

## 2013-01-30 DIAGNOSIS — M069 Rheumatoid arthritis, unspecified: Secondary | ICD-10-CM | POA: Diagnosis present

## 2013-01-30 DIAGNOSIS — R0789 Other chest pain: Secondary | ICD-10-CM | POA: Insufficient documentation

## 2013-01-30 DIAGNOSIS — E785 Hyperlipidemia, unspecified: Secondary | ICD-10-CM | POA: Diagnosis present

## 2013-01-30 DIAGNOSIS — E039 Hypothyroidism, unspecified: Secondary | ICD-10-CM | POA: Diagnosis present

## 2013-01-30 MED ORDER — ONDANSETRON HCL 4 MG/2ML IJ SOLN
4.0000 mg | Freq: Once | INTRAMUSCULAR | Status: DC
  Filled 2013-01-30: qty 2

## 2013-01-30 MED ORDER — ASPIRIN 81 MG PO CHEW: 162.0000 mg | CHEWABLE_TABLET | Freq: Once | ORAL | Status: AC

## 2013-01-30 MED ORDER — ASPIRIN 81 MG PO CHEW
CHEWABLE_TABLET | ORAL | Status: AC
  Administered 2013-01-30: 162 mg via ORAL
  Filled 2013-01-30: qty 2

## 2013-01-30 NOTE — ED Notes (Addendum)
Pt c/o abd pain radiating up to chest and into right arm at 7pm- states was sweaty and nauseated but did not vomit- reports total of 12 episodes between 4p and 11p

## 2013-01-30 NOTE — ED Provider Notes (Signed)
History     This chart was scribed for [name] [name] Smitty Cords, MD, MD by Smitty Pluck, ED Scribe. The patient was seen in room MH06/MH06 and the patient's care was started at 11:46 PM.  CSN: [id number] Arrival date & time 01/30/13  2323  Chief Complaint  Patient presents with  . Chest Pain  . Abdominal Pain    Patient is a 78 y.o. female presenting with chest pain. The history is provided by the patient and medical records. No language interpreter was used.  Chest Pain Pain location:  Epigastric Pain quality comment:  Rising sensation with nausea Pain radiates to:  R arm and L shoulder Pain radiates to the back: no   Pain severity:  Unable to specify Onset quality:  Sudden Duration:  5 minutes (multiple episodes this evening) Timing:  Intermittent Progression:  Unchanged Chronicity:  New Context: at rest   Context: not eating   Relieved by:  Aspirin Worsened by:  Nothing tried Ineffective treatments:  None tried Associated symptoms: diaphoresis and nausea   Associated symptoms: no fever and not vomiting   Risk factors: no smoking    HPI Comments:  [name] [name] is a 78 y.o. female who presents to the Emergency Department complaining of sudden onset of intermittent, moderate periumbilical nausea sensation that radiates chest to right arm  and intermittently to left arm onset today 4.5 hours ago. She denies have any discomfort currently. She reports that the symptoms feel more like nausea than pain. Denies hx of acid reflux and ulcers. Pt denies diaphoresis, numbness, fever, chills, vomiting, diarrhea, weakness, cough, SOB and any other pain.    Past Medical History  Diagnosis Date  . HYPERLIPIDEMIA 01/19/2007  . HYPERTENSION 01/19/2007  . HYPOTHYROIDISM 01/19/2007  . Rheumatoid arthritis(714.0) 01/19/2007   Past Surgical History  Procedure Laterality Date  . Appendectomy    . Cholecystectomy    . Abdominal hysterectomy    . Neck surgery     No family history on  file. History  Substance Use Topics  . Smoking status: Never Smoker   . Smokeless tobacco: Never Used  . Alcohol Use: No   OB History   Grav Para Term Preterm Abortions TAB SAB Ect Mult Living                 Review of Systems  Constitutional: Positive for diaphoresis. Negative for fever and chills.  Cardiovascular: Positive for chest pain.  Gastrointestinal: Positive for nausea. Negative for vomiting.  All other systems reviewed and are negative.    Allergies  Tramadol and Codeine phosphate  Home Medications   Current Outpatient Rx  Name  Route  Sig  Dispense  Refill  . atorvastatin (LIPITOR) 80 MG tablet   Oral   Take 1 tablet (80 mg total) by mouth daily.   90 tablet   3   . benazepril-hydrochlorthiazide (LOTENSIN HCT) 10-12.5 MG per tablet   Oral   Take 1 tablet by mouth daily.   90 tablet   6     Needs to be seen   . folic acid (FOLVITE) 1 MG tablet   Oral   Take 1 mg by mouth daily.           . hydroxychloroquine (PLAQUENIL) 200 MG tablet               . levothyroxine (SYNTHROID, LEVOTHROID) 50 MCG tablet   Oral   Take 1 tablet (50 mcg total) by mouth daily.   90 tablet  6   . levothyroxine (SYNTHROID, LEVOTHROID) 50 MCG tablet      TAKE 1 TABLET (50 MCG TOTAL) BY MOUTH DAILY.   90 tablet   01   . methotrexate 2.5 MG tablet   Oral   Take by mouth. 6 tabs weekly          . HYDROcodone-acetaminophen (NORCO/VICODIN) 5-325 MG per tablet   Oral   Take 1 tablet by mouth every 6 (six) hours as needed for pain.   50 tablet   3   . traMADol (ULTRAM) 50 MG tablet   Oral   Take 1 tablet (50 mg total) by mouth every 8 (eight) hours as needed for pain.   60 tablet   4    BP 145/60  Pulse 91  Resp 16  SpO2 97%   Physical Exam  Nursing note and vitals reviewed. Constitutional: She is oriented to person, place, and time. She appears well-developed and well-nourished. No distress.  HENT:  Head: Normocephalic and atraumatic.   Mouth/Throat: Oropharynx is clear and moist.  Eyes: EOM are normal. Pupils are equal, round, and reactive to light.  Neck: Normal range of motion. Neck supple. No tracheal deviation present.  Cardiovascular: Normal rate, regular rhythm, normal heart sounds and intact distal pulses.   No murmur heard. Pulmonary/Chest: Effort normal and breath sounds normal. No respiratory distress. She has no wheezes. She has no rales.  Abdominal: Soft. Bowel sounds are normal. She exhibits no distension and no mass. There is no tenderness. There is no rebound and no guarding.  Moderate RUQ gas present   Musculoskeletal: Normal range of motion. She exhibits no edema.  Neurological: She is alert and oriented to person, place, and time. She has normal reflexes.  Skin: Skin is warm and dry. She is not diaphoretic.  Psychiatric: She has a normal mood and affect. Her behavior is normal.    ED Course  Procedures (including critical care time) DIAGNOSTIC STUDIES: Oxygen Saturation is 97% on [place], normal by my interpretation.    COORDINATION OF CARE: 11:52 PM Discussed ED treatment with pt and pt agrees.     Labs Reviewed  CBC WITH DIFFERENTIAL  COMPREHENSIVE METABOLIC PANEL  LIPASE, BLOOD  TROPONIN I   Dg Chest Port 1 View  01/31/2013   *RADIOLOGY REPORT*  Clinical Data: Abdominal pain radiating to the chest.  Nausea.  PORTABLE CHEST - 1 VIEW  Comparison: 12/04/2010.  Findings: There is no free air underneath the hemidiaphragms. Cardiopericardial silhouette is within normal limits for size. Cholecystectomy clips are present in the right upper quadrant. Tortuous thoracic aorta appears similar to prior.  Lower cervical ACDF.  There is pulmonary vascular congestion, with cephalization of pulmonary blood flow. No airspace disease or effusion.  IMPRESSION: Pulmonary vascular congestion.   Original Report Authenticated By: Andreas Newport, M.D.   No diagnosis found.  MDM   Date: 01/31/2013  Rate:98  Rhythm:  normal sinus rhythm  QRS Axis: normal  Intervals: normal  ST/T Wave abnormalities: normal  Conduction Disutrbances: none  Narrative Interpretation: unremarkable   Results for orders placed during the hospital encounter of 01/30/13  CBC WITH DIFFERENTIAL      Result Value Range   WBC 6.7  4.0 - 10.5 K/uL   RBC 4.26  3.87 - 5.11 MIL/uL   Hemoglobin 11.9 (*) 12.0 - 15.0 g/dL   HCT 16.1 (*) 09.6 - 04.5 %   MCV 82.9  78.0 - 100.0 fL   MCH 27.9  26.0 - 34.0  pg   MCHC 33.7  30.0 - 36.0 g/dL   RDW 16.1  09.6 - 04.5 %   Platelets 253  150 - 400 K/uL   Neutrophils Relative % 74  43 - 77 %   Neutro Abs 5.0  1.7 - 7.7 K/uL   Lymphocytes Relative 19  12 - 46 %   Lymphs Abs 1.3  0.7 - 4.0 K/uL   Monocytes Relative 6  3 - 12 %   Monocytes Absolute 0.4  0.1 - 1.0 K/uL   Eosinophils Relative 0  0 - 5 %   Eosinophils Absolute 0.0  0.0 - 0.7 K/uL   Basophils Relative 0  0 - 1 %   Basophils Absolute 0.0  0.0 - 0.1 K/uL  COMPREHENSIVE METABOLIC PANEL      Result Value Range   Sodium 136  135 - 145 mEq/L   Potassium 3.9  3.5 - 5.1 mEq/L   Chloride 98  96 - 112 mEq/L   CO2 26  19 - 32 mEq/L   Glucose, Bld 139 (*) 70 - 99 mg/dL   BUN 22  6 - 23 mg/dL   Creatinine, Ser 4.09  0.50 - 1.10 mg/dL   Calcium 9.8  8.4 - 81.1 mg/dL   Total Protein 7.7  6.0 - 8.3 g/dL   Albumin 3.8  3.5 - 5.2 g/dL   AST 21  0 - 37 U/L   ALT 11  0 - 35 U/L   Alkaline Phosphatase 71  39 - 117 U/L   Total Bilirubin 0.2 (*) 0.3 - 1.2 mg/dL   GFR calc non Af Amer 48 (*) >90 mL/min   GFR calc Af Amer 55 (*) >90 mL/min  LIPASE, BLOOD      Result Value Range   Lipase 48  11 - 59 U/L  TROPONIN I      Result Value Range   Troponin I <0.30  <0.30 ng/mL   Dg Chest Port 1 View  01/31/2013   *RADIOLOGY REPORT*  Clinical Data: Abdominal pain radiating to the chest.  Nausea.  PORTABLE CHEST - 1 VIEW  Comparison: 12/04/2010.  Findings: There is no free air underneath the hemidiaphragms. Cardiopericardial silhouette is within  normal limits for size. Cholecystectomy clips are present in the right upper quadrant. Tortuous thoracic aorta appears similar to prior.  Lower cervical ACDF.  There is pulmonary vascular congestion, with cephalization of pulmonary blood flow. No airspace disease or effusion.  IMPRESSION: Pulmonary vascular congestion.   Original Report Authenticated By: Andreas Newport, M.D.    Medications  ondansetron (ZOFRAN) injection 4 mg (not administered)  aspirin chewable tablet 162 mg (162 mg Oral Given 01/30/13 2341)      I personally performed the services described in this documentation, which was scribed in my presence. The recorded information has been reviewed and is accurate.    Jasmine Awe, MD 01/31/13 [phone]

## 2013-01-31 ENCOUNTER — Encounter (HOSPITAL_BASED_OUTPATIENT_CLINIC_OR_DEPARTMENT_OTHER): Payer: Self-pay | Admitting: Emergency Medicine

## 2013-01-31 DIAGNOSIS — R079 Chest pain, unspecified: Secondary | ICD-10-CM

## 2013-01-31 LAB — LIPASE, BLOOD: Lipase: 48 U/L (ref 11–59)

## 2013-01-31 LAB — LIPID PANEL: Cholesterol: 212 mg/dL — ABNORMAL HIGH (ref 0–200)

## 2013-01-31 LAB — RETICULOCYTES
RBC.: 4.18 MIL/uL (ref 3.87–5.11)
Retic Count, Absolute: 25.1 10*3/uL (ref 19.0–186.0)
Retic Ct Pct: 0.6 % (ref 0.4–3.1)

## 2013-01-31 LAB — COMPREHENSIVE METABOLIC PANEL
ALT: 9 U/L (ref 0–35)
Alkaline Phosphatase: 63 U/L (ref 39–117)
BUN: 22 mg/dL (ref 6–23)
CO2: 26 mEq/L (ref 19–32)
CO2: 28 mEq/L (ref 19–32)
Calcium: 9.4 mg/dL (ref 8.4–10.5)
Calcium: 9.8 mg/dL (ref 8.4–10.5)
Chloride: 100 mEq/L (ref 96–112)
Creatinine, Ser: 1.1 mg/dL (ref 0.50–1.10)
GFR calc Af Amer: 55 mL/min — ABNORMAL LOW (ref >90)
GFR calc Af Amer: 55 mL/min — ABNORMAL LOW (ref >90)
GFR calc non Af Amer: 48 mL/min — ABNORMAL LOW (ref >90)
GFR calc non Af Amer: 48 mL/min — ABNORMAL LOW (ref >90)
Glucose, Bld: 139 mg/dL — ABNORMAL HIGH (ref 70–99)
Glucose, Bld: 140 mg/dL — ABNORMAL HIGH (ref 70–99)
Sodium: 138 mEq/L (ref 135–145)
Total Bilirubin: 0.1 mg/dL — ABNORMAL LOW (ref 0.3–1.2)

## 2013-01-31 LAB — CBC
MCV: 81.8 fL (ref 78.0–100.0)
Platelets: 230 10*3/uL (ref 150–400)
RDW: 15 % (ref 11.5–15.5)
WBC: 6.5 10*3/uL (ref 4.0–10.5)

## 2013-01-31 LAB — TROPONIN I
Troponin I: <0.3 ng/mL (ref <0.30)
Troponin I: <0.3 ng/mL (ref <0.30)
Troponin I: <0.3 ng/mL (ref <0.30)

## 2013-01-31 LAB — CBC WITH DIFFERENTIAL/PLATELET
Eosinophils Absolute: 0 10*3/uL (ref 0.0–0.7)
Eosinophils Relative: 0 % (ref 0–5)
HCT: 35.3 % — ABNORMAL LOW (ref 36.0–46.0)
Lymphocytes Relative: 19 % (ref 12–46)
Lymphs Abs: 1.3 10*3/uL (ref 0.7–4.0)
MCH: 27.9 pg (ref 26.0–34.0)
MCV: 82.9 fL (ref 78.0–100.0)
Monocytes Absolute: 0.4 10*3/uL (ref 0.1–1.0)
RBC: 4.26 MIL/uL (ref 3.87–5.11)
WBC: 6.7 10*3/uL (ref 4.0–10.5)

## 2013-01-31 LAB — FERRITIN: Ferritin: 26 ng/mL (ref 10–291)

## 2013-01-31 LAB — PRO B NATRIURETIC PEPTIDE: Pro B Natriuretic peptide (BNP): 129.7 pg/mL (ref 0–450)

## 2013-01-31 LAB — IRON AND TIBC: Iron: 35 ug/dL — ABNORMAL LOW (ref 42–135)

## 2013-01-31 MED ORDER — HYDROCODONE-ACETAMINOPHEN 5-325 MG PO TABS: 1.0000 | ORAL_TABLET | Freq: Four times a day (QID) | ORAL | Status: DC | PRN

## 2013-01-31 MED ORDER — TRAMADOL HCL 50 MG PO TABS: 50.0000 mg | ORAL_TABLET | Freq: Two times a day (BID) | ORAL | Status: DC | PRN

## 2013-01-31 MED ORDER — HYDROXYCHLOROQUINE SULFATE 200 MG PO TABS
200.0000 mg | ORAL_TABLET | Freq: Every day | ORAL | Status: DC
  Administered 2013-01-31 – 2013-02-01 (×2): 200 mg via ORAL
  Filled 2013-01-31 (×2): qty 1

## 2013-01-31 MED ORDER — HEPARIN SODIUM (PORCINE) 5000 UNIT/ML IJ SOLN
5000.0000 [IU] | Freq: Three times a day (TID) | INTRAMUSCULAR | Status: DC
  Administered 2013-01-31 – 2013-02-01 (×3): 5000 [IU] via SUBCUTANEOUS
  Filled 2013-01-31 (×6): qty 1

## 2013-01-31 MED ORDER — PANTOPRAZOLE SODIUM 40 MG PO TBEC
40.0000 mg | DELAYED_RELEASE_TABLET | Freq: Every day | ORAL | Status: DC
  Administered 2013-01-31 – 2013-02-01 (×2): 40 mg via ORAL
  Filled 2013-01-31 (×2): qty 1

## 2013-01-31 MED ORDER — HYDROCHLOROTHIAZIDE 12.5 MG PO CAPS
12.5000 mg | ORAL_CAPSULE | Freq: Every day | ORAL | Status: DC
  Filled 2013-01-31 (×2): qty 1

## 2013-01-31 MED ORDER — BENAZEPRIL HCL 10 MG PO TABS
10.0000 mg | ORAL_TABLET | Freq: Every day | ORAL | Status: DC
  Filled 2013-01-31 (×2): qty 1

## 2013-01-31 MED ORDER — SODIUM CHLORIDE 0.9 % IJ SOLN
3.0000 mL | Freq: Two times a day (BID) | INTRAMUSCULAR | Status: DC
  Administered 2013-01-31: 3 mL via INTRAVENOUS

## 2013-01-31 MED ORDER — GI COCKTAIL ~~LOC~~
30.0000 mL | Freq: Once | ORAL | Status: AC
  Administered 2013-01-31: 30 mL via ORAL
  Filled 2013-01-31: qty 30

## 2013-01-31 MED ORDER — ASPIRIN EC 325 MG PO TBEC
325.0000 mg | DELAYED_RELEASE_TABLET | Freq: Every day | ORAL | Status: DC
  Administered 2013-01-31 – 2013-02-01 (×2): 325 mg via ORAL
  Filled 2013-01-31 (×2): qty 1

## 2013-01-31 MED ORDER — FUROSEMIDE 40 MG PO TABS
40.0000 mg | ORAL_TABLET | Freq: Every day | ORAL | Status: DC
  Administered 2013-01-31: 40 mg via ORAL
  Filled 2013-01-31: qty 1

## 2013-01-31 MED ORDER — LEVOTHYROXINE SODIUM 50 MCG PO TABS
50.0000 ug | ORAL_TABLET | Freq: Every day | ORAL | Status: DC
  Administered 2013-01-31 – 2013-02-01 (×2): 50 ug via ORAL
  Filled 2013-01-31 (×3): qty 1

## 2013-01-31 MED ORDER — FUROSEMIDE 40 MG PO TABS: 40.0000 mg | ORAL_TABLET | ORAL | Status: DC

## 2013-01-31 MED ORDER — SODIUM CHLORIDE 0.9 % IV SOLN: 250.0000 mL | INTRAVENOUS | Status: DC | PRN

## 2013-01-31 MED ORDER — BENAZEPRIL-HYDROCHLOROTHIAZIDE 10-12.5 MG PO TABS: 1.0000 | ORAL_TABLET | Freq: Every day | ORAL | Status: DC

## 2013-01-31 MED ORDER — SODIUM CHLORIDE 0.9 % IJ SOLN: 3.0000 mL | INTRAMUSCULAR | Status: DC | PRN

## 2013-01-31 MED ORDER — FOLIC ACID 1 MG PO TABS
1.0000 mg | ORAL_TABLET | Freq: Every day | ORAL | Status: DC
  Administered 2013-01-31 – 2013-02-01 (×2): 1 mg via ORAL
  Filled 2013-01-31 (×2): qty 1

## 2013-01-31 MED ORDER — SODIUM CHLORIDE 0.9 % IJ SOLN
3.0000 mL | Freq: Two times a day (BID) | INTRAMUSCULAR | Status: DC
  Administered 2013-02-01: 3 mL via INTRAVENOUS

## 2013-01-31 NOTE — Evaluation (Signed)
Received call from [facility] about a 78 year old female with atypical chest pain x1 day. Patient has had epigastric/abdominal pain that has radiated to the right chest over the past day. Baseline history of hypertension, hyperlipidemia, rheumatoid arthritis. Patient has some faint nausea/diaphoresis with episodes. Workup including CBC, cemented, lipase, chest x-ray essentially within normal limits apart from a hemoglobin of 11.9 and some questionable vascular congestion on chest x-ray. A proBNP is pending. Respiratory status stable.  Plan for patient to be admitted to telemetry bed at [facility] for a chest pain rule out. Team 10.

## 2013-01-31 NOTE — Progress Notes (Signed)
PROGRESS NOTE  [name] [name] WUJ:[id number] DOB: 05-Oct-1934 DOA: 01/30/2013 PCP: Rogelia Boga, MD  Brief narrative: 78 y/o female admitted 01/31/13 with Diaphoresis/n/RA and admitted mainly for CP-but CXR showed pulm vasc congestion.   Past medical history-As per Problem list Chart reviewed as below- Admission 12/09/10 for cervical spondylosis c5-T1 for ACDF Admission 04/25/01 subacute cholecystitis with passage CBD stone  Consultants:  Telephone consulted Molli Knock, CCM  Procedures:  CXR 1 view 6/-Pulm edema 30/14  Antibiotics:   none   Subjective  States she presented with vague abdominal pain, diaphoresis which ran up and down her chest and eventually down to her arm-no inciting worsening factors States past 2 weeks she is felt a little bit more full in her feet in her abdomen Usually walks 3 miles on a treadmill but for the past 6 months has only been able to walk 2 with getting tired States no fevers no chills no cough no sputum no exposure to ill contacts or exotic foreign travel Takes methotrexate 2.5, Plaquenil 2.5 daily At baseline she takes care of her self, does not need assistive device and still drives   Objective    Interim History:   Telemetry: No Tele changes  Objective: Filed Vitals:   01/31/13 0103 01/31/13 0130 01/31/13 0254 01/31/13 1258  BP: 118/52 111/51 121/47 98/63  Pulse:  92 80 76  Temp:   97.9 F (36.6 C) 97.8 F (36.6 C)  TempSrc:   Oral Oral  Resp:  16 18 18   Height:   5\' 1"  (1.549 m)   Weight:   65.726 kg (144 lb 14.4 oz)   SpO2:  100% 100% 97%    Intake/Output Summary (Last 24 hours) at 01/31/13 1310 Last data filed at 01/31/13 1259  Gross per 24 hour  Intake    480 ml  Output      0 ml  Net    480 ml    Exam:  General: Alert pleasant oriented Cardiovascular: S1-S2 no murmur rub or gallop Respiratory:  Clinically clear no added sound Abdomen: Soft nontender nondistended Skin lower extremity grade 1  edema, Neuro range of motion intact, strength 5/5 overall, sensory grossly intact, moving all 4 limbs equally  Data Reviewed: Basic Metabolic Panel:  Recent Labs Lab 01/30/13 2355 01/31/13 0458  NA 136 138  K 3.9 4.6  CL 98 100  CO2 26 28  GLUCOSE 139* 140*  BUN 22 19  CREATININE 1.10 1.10  CALCIUM 9.8 9.4   Liver Function Tests:  Recent Labs Lab 01/30/13 2355 01/31/13 0458  AST 21 17  ALT 11 9  ALKPHOS 71 63  BILITOT 0.2* 0.1*  PROT 7.7 6.9  ALBUMIN 3.8 3.4*    Recent Labs Lab 01/30/13 2355  LIPASE 48   No results found for this basename: AMMONIA,  in the last 168 hours CBC:  Recent Labs Lab 01/30/13 2355 01/31/13 0458  WBC 6.7 6.5  NEUTROABS 5.0  --   HGB 11.9* 11.3*  HCT 35.3* 34.2*  MCV 82.9 81.8  PLT 253 230   Cardiac Enzymes:  Recent Labs Lab 01/30/13 2355 01/31/13 0458 01/31/13 0909  TROPONINI <0.30 <0.30 <0.30   BNP: No components found with this basename: POCBNP,  CBG: No results found for this basename: GLUCAP,  in the last 168 hours  No results found for this or any previous visit (from the past 240 hour(s)).   Studies:              All Imaging reviewed  and is as per above notation   Scheduled Meds: . aspirin EC  325 mg Oral Daily  . furosemide  40 mg Oral Daily  . heparin  5,000 Units Subcutaneous Q8H  . ondansetron (ZOFRAN) IV  4 mg Intravenous Once  . pantoprazole  40 mg Oral Daily  . sodium chloride  3 mL Intravenous Q12H  . sodium chloride  3 mL Intravenous Q12H   Continuous Infusions:    Assessment/Plan: 1. ? Methotrexate lung injury versus mild CHF- BNP was not really suggestive [129] of CHF-she is ruled out by cardiac enzymes-await echocardiogram, report and will use lasix PRN qod 40, BMET am                -?Methotrexate lung injury: Pulmonary Dr. Molli Knock recommends potential       steroids, discontinuation methotrexate.  Have done so and informed the                   patient. Continue Plaquenil, consider  outpatient steroid use with                     Rheumatologist. 2.    rheumatoid arthritis-follow up with outpatient physician 3.    hypercholesterolemia-may need statin as an outpatient, LDL was 106, VLDL        was 45 and total was 12 4.    ? CAD-ruled out for coronary syndrome. Worthwhile to consider aspirin low            dose, although decreased evidence for primary prevention in females  Code Status: Full Family Communication: Family at bedside, understands plan of care Disposition Plan: Inpatient x 1 more day   Pleas Koch, MD  [facility] Pager [phone] 01/31/2013, 1:10 PM    LOS: 1 day

## 2013-01-31 NOTE — Progress Notes (Signed)
INITIAL NUTRITION ASSESSMENT  DOCUMENTATION CODES Per approved criteria  -Not Applicable   INTERVENTION:  No nutrition intervention at this time ---> patient declined RD to follow for nutrition care plan  NUTRITION DIAGNOSIS: Inadequate oral intake related to decreased appetite as evidenced by family report  Goal: Oral intake with meals to meet >/= 90% of estimated nutrition needs  Monitor:  PO intake, weight, labs, I/O's  Reason for Assessment: Malnutrition Screening Tool Report  78 y.o. female  Admitting Dx: abdominal pain, atypical chest pain  ASSESSMENT: Patient with PMH of HTN, HLD, hypothyroidism, RA presenting with abdominal pain and atypical CP; abdominal pain was fairly mild in nature however did radiate to the chest with some associated nausea & diaphoresis.  RD spoke with patient and family at bedside; family reports patient's appetite has been decreased for quite some time; state she only consumes 1 meal per day; patient denies recent weight loss; RD offered supplements, however, patient declined.  Height: Ht Readings from Last 1 Encounters:  01/31/13 5\' 1"  (1.549 m)    Weight: Wt Readings from Last 1 Encounters:  01/31/13 144 lb 14.4 oz (65.726 kg)    Ideal Body Weight: 105 lb  % Ideal Body Weight: 137%  Wt Readings from Last 10 Encounters:  01/31/13 144 lb 14.4 oz (65.726 kg)  11/01/12 150 lb (68.04 kg)  10/14/12 153 lb (69.4 kg)  06/21/12 150 lb (68.04 kg)  11/11/11 152 lb (68.947 kg)  04/25/11 140 lb (63.504 kg)  04/09/11 140 lb (63.504 kg)  10/03/10 153 lb (69.4 kg)  07/04/09 155 lb (70.308 kg)  01/05/09 160 lb (72.576 kg)    Usual Body Weight: 150 lb  % Usual Body Weight: 96%  BMI:  Body mass index is 27.39 kg/(m^2).  Estimated Nutritional Needs: Kcal: 1600-1800 Protein: 80-90 gm Fluid: 1.6-1.8 L  Skin: Intact  Diet Order: Cardiac  EDUCATION NEEDS: -No education needs identified at this time   Intake/Output Summary (Last  24 hours) at 01/31/13 1407 Last data filed at 01/31/13 1259  Gross per 24 hour  Intake    480 ml  Output      0 ml  Net    480 ml    Labs:   Recent Labs Lab 01/30/13 2355 01/31/13 0458  NA 136 138  K 3.9 4.6  CL 98 100  CO2 26 28  BUN 22 19  CREATININE 1.10 1.10  CALCIUM 9.8 9.4  GLUCOSE 139* 140*    Scheduled Meds: . aspirin EC  325 mg Oral Daily  . benazepril  10 mg Oral Daily  . folic acid  1 mg Oral Daily  . furosemide  40 mg Oral QODAY  . heparin  5,000 Units Subcutaneous Q8H  . hydrochlorothiazide  12.5 mg Oral Daily  . hydroxychloroquine  200 mg Oral Daily  . levothyroxine  50 mcg Oral QAC breakfast  . ondansetron (ZOFRAN) IV  4 mg Intravenous Once  . pantoprazole  40 mg Oral Daily  . sodium chloride  3 mL Intravenous Q12H  . sodium chloride  3 mL Intravenous Q12H    Continuous Infusions:   Past Medical History  Diagnosis Date  . HYPERLIPIDEMIA 01/19/2007  . HYPERTENSION 01/19/2007  . HYPOTHYROIDISM 01/19/2007  . Rheumatoid arthritis(714.0) 01/19/2007    Past Surgical History  Procedure Laterality Date  . Appendectomy    . Cholecystectomy    . Abdominal hysterectomy    . Neck surgery      Maureen Chatters, RD, LDN Pager #: [phone]  After-Hours Pager #: [phone]

## 2013-01-31 NOTE — H&P (Signed)
Hospitalist Admission History and Physical  Patient name: [name] [name] Medical record number: [id number] Date of birth: 1935-05-26 Age: 78 y.o. Gender: female  Primary Care Provider: Rogelia Boga, MD  Chief Complaint: abdominal pain/atypical CP   History of Present Illness:This is a 78 y.o. year old female with significant past medical history of HTN, HLD, hypothyroidism, RA presenting with abdominal pain and atypical CP. Patient states she's had multiple episodes of diffuse abdominal pain over the course of yesterday evening. Abdominal pain was fairly mild in nature however did radiate to the chest with some associated nausea and diaphoresis. No true chest pain per patient. Pain does seem to radiate more so to the right. No hemiparesis or confusion. Pt ate 1-2 pieces of watermelon prior to incident. Has had chronically decreased appetite. No diarrhea, dysuria, vomiting. No fevers or chills. Pt was baby ASA x 2 at home as well as 2 additional ASA in ER. This markedly improved sxs.   In the ER, EKG and trop WNL. Noted mild vascular congestion on CXR. Pro-BNP WNL.    Patient Active Problem List   Diagnosis Date Noted  . HYPOTHYROIDISM 01/19/2007  . HYPERLIPIDEMIA 01/19/2007  . HYPERTENSION 01/19/2007  . RHEUMATOID ARTHRITIS 01/19/2007   Past Medical History: Past Medical History  Diagnosis Date  . HYPERLIPIDEMIA 01/19/2007  . HYPERTENSION 01/19/2007  . HYPOTHYROIDISM 01/19/2007  . Rheumatoid arthritis(714.0) 01/19/2007    Past Surgical History: Past Surgical History  Procedure Laterality Date  . Appendectomy    . Cholecystectomy    . Abdominal hysterectomy    . Neck surgery      Social History: History   Social History  . Marital Status: Married    Spouse Name: N/A    Number of Children: N/A  . Years of Education: N/A   Social History Main Topics  . Smoking status: Never Smoker   . Smokeless tobacco: Never Used  . Alcohol Use: No  . Drug Use: No  . Sexually  Active: None   Other Topics Concern  . None   Social History Narrative  . None    Family History: History reviewed. No pertinent family history.  Allergies: Allergies  Allergen Reactions  . Tramadol Nausea Only  . Codeine Phosphate     REACTION: unspecified    Current Facility-Administered Medications  Medication Dose Route Frequency Provider Last Rate Last Dose  . 0.9 %  sodium chloride infusion  250 mL Intravenous PRN Doree Albee, MD      . aspirin EC tablet 325 mg  325 mg Oral Daily Doree Albee, MD      . gi cocktail (Maalox,Lidocaine,Donnatal)  30 mL Oral Once Doree Albee, MD      . heparin injection 5,000 Units  5,000 Units Subcutaneous Q8H Doree Albee, MD      . ondansetron [facility]) injection 4 mg  4 mg Intravenous Once April K Palumbo-Rasch, MD      . pantoprazole (PROTONIX) EC tablet 40 mg  40 mg Oral Daily Doree Albee, MD      . sodium chloride 0.9 % injection 3 mL  3 mL Intravenous Q12H Doree Albee, MD      . sodium chloride 0.9 % injection 3 mL  3 mL Intravenous Q12H Doree Albee, MD      . sodium chloride 0.9 % injection 3 mL  3 mL Intravenous PRN Doree Albee, MD       Review Of Systems: 12 point ROS negative except as noted above in HPI.  Physical  Exam: Filed Vitals:   01/31/13 0254  BP: 121/47  Pulse: 80  Temp: 97.9 F (36.6 C)  Resp: 18    General: alert and cooperative HEENT: PERRLA and extra ocular movement intact Heart: S1, S2 normal, no murmur, rub or gallop, regular rate and rhythm Lungs: clear to auscultation, no wheezes or rales and unlabored breathing Abdomen: abdomen is soft without significant tenderness, masses, organomegaly or guarding Extremities: 2+ peripheral pulses, 1-2  pitting edema bilaterally  Skin:no rashes, no ecchymoses Neurology: normal without focal findings  Labs and Imaging: Lab Results  Component Value Date/Time   NA 136 01/30/2013 11:55 PM   K 3.9 01/30/2013 11:55 PM   CL 98 01/30/2013 11:55 PM   CO2 26  01/30/2013 11:55 PM   BUN 22 01/30/2013 11:55 PM   CREATININE 1.10 01/30/2013 11:55 PM   GLUCOSE 139* 01/30/2013 11:55 PM   GLUCOSE 117* 07/01/2006 10:14 AM   Lab Results  Component Value Date   WBC 6.7 01/30/2013   HGB 11.9* 01/30/2013   HCT 35.3* 01/30/2013   MCV 82.9 01/30/2013   PLT 253 01/30/2013   BNP    Component Value Date/Time   PROBNP 129.7 01/31/2013 0114     Dg Chest Port 1 View  01/31/2013   *RADIOLOGY REPORT*  Clinical Data: Abdominal pain radiating to the chest.  Nausea.  PORTABLE CHEST - 1 VIEW  Comparison: 12/04/2010.  Findings: There is no free air underneath the hemidiaphragms. Cardiopericardial silhouette is within normal limits for size. Cholecystectomy clips are present in the right upper quadrant. Tortuous thoracic aorta appears similar to prior.  Lower cervical ACDF.  There is pulmonary vascular congestion, with cephalization of pulmonary blood flow. No airspace disease or effusion.  IMPRESSION: Pulmonary vascular congestion.   Original Report Authenticated By: Andreas Newport, M.D.     Assessment and Plan: [name] [name] is a 78 y.o. year old female presenting with abdominal pain and atypical CP.   Plan:  Cycle CEs, risk stratification labs  2D ECHO to assess contractility   GI cocktail x1-No palpable abd pain/distension on exam. Consider CT if abd pain worsens.  PPI   Anemia: No active signs of bleeding. Check anemia panel.  FEN/GI: heart healthy diet. PPI Prophylaxis: subq heparin Disposition: pending further evaluation Code Status:Full Code       Doree Albee MD  Pager: [phone]

## 2013-01-31 NOTE — Progress Notes (Signed)
Echo Lab  2D Echocardiogram completed.  [name] [name] [name], RDCS 01/31/2013 11:40 AM

## 2013-02-01 DIAGNOSIS — T451X5A Adverse effect of antineoplastic and immunosuppressive drugs, initial encounter: Secondary | ICD-10-CM

## 2013-02-01 DIAGNOSIS — J984 Other disorders of lung: Secondary | ICD-10-CM

## 2013-02-01 DIAGNOSIS — I509 Heart failure, unspecified: Secondary | ICD-10-CM

## 2013-02-01 MED ORDER — FUROSEMIDE 20 MG PO TABS: 20.0000 mg | ORAL_TABLET | Freq: Every day | ORAL | Status: DC

## 2013-02-01 NOTE — Progress Notes (Signed)
Reviewed discharge instructions with patient and she stated her understanding.  Ambulated in hallway without dizziness and lightheadiness.  Instructed patient to take blood pressure daily and keep record for primary MD visit on Monday and monitor for lightheadiness and dizziness.  Awaiting son for discharge home.  Colman Cater

## 2013-02-01 NOTE — Discharge Summary (Signed)
Physician Discharge Summary  [name] [name] ZOX:[id number] DOB: [birth year] DOA: 01/30/2013  PCP: Rogelia Boga, MD  Admit date: 01/30/2013 Discharge date: 02/01/2013  Time spent: 35 minutes  Recommendations for Outpatient Follow-up:  1. Needs follow-up with primary care 2. Consider both cardiology, pulmonary evaluation in one to 2 weeks  3. Started Lasix this hospital visit 4. Needs basic metabolic panel probably a week, TSH 5. Needs discussion with PCP regarding statin therapy-noted VLD total and LDL elevated  Discharge Diagnoses:  Principal Problem:   CHF, acute Active Problems:   HYPOTHYROIDISM   HYPERLIPIDEMIA   HYPERTENSION   Rheumatoid arthritis(714.0)   Methotrexate lung??   Discharge Condition: Stable  Diet recommendation: Heart healthy low-salt  Filed Weights   01/31/13 0254 02/01/13 0542  Weight: 65.726 kg (144 lb 14.4 oz) 64.819 kg (142 lb 14.4 oz)    History of present illness:  Pleasant 78 year old female with rheumatoid hypertension hyperlipidemia presented with shortness of breath of insidious onset over one to 2 months. Noticed a little bit of swelling lower extremities decreased access [place]-wherein she could previously tolerate 3 miles on a walker, now she can do 2 miles on the treadmill. BNP only marginally elevated 170 on admission, echocardiogram however showed grade 1 diastolic dysfunction EF 6570% with trivial aortic regurg mild PA peak pressure 35 mm. No wall motion and matter. Thought process = methotrexate lung versus mild CHF. Discussed with pulmonary Dr. Molli Knock who recommended potentially steroids. Have asked patient to followup with rheumatologist regarding the same, her rheumatoid arthritis is quiescent otherwise Potentially will benefit from outpatient nonemergent cardiac and pulmonary evaluation I left the names of 2 specialists for her. Feel it reasonable to start low-dose Lasix 20 mg daily-can be titrated outpatient Noted she is  also on levothyroxine-recommend outpatient evaluation with TSH to rule out hyperthyroid, high output state  Consultants:  Telephone consulted Molli Knock, CCM Procedures:  CXR 1 view 6/-Pulm edema 30/14 Echo done 6/30-as above Antibiotics:  none  Discharge Exam: Filed Vitals:   01/31/13 1258 01/31/13 1548 01/31/13 2140 02/01/13 0542  BP: 98/63 96/60 113/50 116/62  Pulse: 76 75 72 77  Temp: 97.8 F (36.6 C) 97.9 F (36.6 C) 98.3 F (36.8 C) 98 F (36.7 C)  TempSrc: Oral Oral Oral Oral  Resp: 18 18 18 18   Height:      Weight:    64.819 kg (142 lb 14.4 oz)  SpO2: 97% 97% 99% 99%   Admission 12/09/10 for cervical spondylosis c5-T1 for ACDF  Admission 04/25/01 subacute cholecystitis with passage CBD stone  Alert pleasant oriented no apparent distress  General: Alert pleasant Cardiovascular: S1-S2 no murmur rub or gallop, telemetry benign Respiratory: Clinically clear  Discharge Instructions  Discharge Orders   Future Appointments Provider Department Dept Phone   07/04/2013 9:30 AM Gordy Savers, MD [facility] [phone]   Future Orders Complete By Expires     Call MD for:  hives  As directed     Call MD for:  persistant dizziness or light-headedness  As directed     Call MD for:  persistant nausea and vomiting  As directed     Call MD for:  redness, tenderness, or signs of infection (pain, swelling, redness, odor or green/yellow discharge around incision site)  As directed     Call MD for:  severe uncontrolled pain  As directed     Diet - low sodium heart healthy  As directed     Increase activity slowly  As directed  Medication List    STOP taking these medications       methotrexate 2.5 MG tablet      TAKE these medications       benazepril-hydrochlorthiazide 10-12.5 MG per tablet  Commonly known as:  LOTENSIN HCT  Take 1 tablet by mouth daily.     benazepril-hydrochlorthiazide 10-12.5 MG per tablet  Commonly known as:   LOTENSIN HCT  TAKE 1 TABLET BY MOUTH DAILY.     folic acid 1 MG tablet  Commonly known as:  FOLVITE  Take 1 mg by mouth daily.     furosemide 20 MG tablet  Commonly known as:  LASIX  Take 1 tablet (20 mg total) by mouth daily.     HYDROcodone-acetaminophen 5-325 MG per tablet  Commonly known as:  NORCO/VICODIN  Take 1 tablet by mouth every 6 (six) hours as needed for pain.     hydroxychloroquine 200 MG tablet  Commonly known as:  PLAQUENIL  Take 200 mg by mouth daily.     levothyroxine 50 MCG tablet  Commonly known as:  SYNTHROID, LEVOTHROID  Take 1 tablet (50 mcg total) by mouth daily.     multivitamin with minerals tablet  Take 1 tablet by mouth daily.     OVER THE COUNTER MEDICATION  Place 1 drop into both eyes daily. "hyper tear"     traMADol 50 MG tablet  Commonly known as:  ULTRAM  Take 1 tablet (50 mg total) by mouth every 8 (eight) hours as needed for pain.       Allergies  Allergen Reactions  . Tramadol Nausea Only  . Codeine Phosphate     REACTION: unspecified       Follow-up Information   Follow up with Rogelia Boga, MD.   Contact information:   [address] [place] [address] [phone]       Follow up with [facility], MD. Schedule an appointment as soon as possible for a visit in 1 week. (Follow up for lung condition in about 1 week)    Contact information:   [address] [place] [address] [phone]        The results of significant diagnostics from this hospitalization (including imaging, microbiology, ancillary and laboratory) are listed below for reference.    Significant Diagnostic Studies: Dg Chest Port 1 View  01/31/2013   *RADIOLOGY REPORT*  Clinical Data: Abdominal pain radiating to the chest.  Nausea.  PORTABLE CHEST - 1 VIEW  Comparison: 12/04/2010.  Findings: There is no free air underneath the hemidiaphragms. Cardiopericardial silhouette is within normal limits for size. Cholecystectomy  clips are present in the right upper quadrant. Tortuous thoracic aorta appears similar to prior.  Lower cervical ACDF.  There is pulmonary vascular congestion, with cephalization of pulmonary blood flow. No airspace disease or effusion.  IMPRESSION: Pulmonary vascular congestion.   Original Report Authenticated By: Andreas Newport, M.D.    Microbiology: No results found for this or any previous visit (from the past 240 hour(s)).   Labs: Basic Metabolic Panel:  Recent Labs Lab 01/30/13 2355 01/31/13 0458  NA 136 138  K 3.9 4.6  CL 98 100  CO2 26 28  GLUCOSE 139* 140*  BUN 22 19  CREATININE 1.10 1.10  CALCIUM 9.8 9.4   Liver Function Tests:  Recent Labs Lab 01/30/13 2355 01/31/13 0458  AST 21 17  ALT 11 9  ALKPHOS 71 63  BILITOT 0.2* 0.1*  PROT 7.7 6.9  ALBUMIN 3.8 3.4*  Recent Labs Lab 01/30/13 2355  LIPASE 48   No results found for this basename: AMMONIA,  in the last 168 hours CBC:  Recent Labs Lab 01/30/13 2355 01/31/13 0458  WBC 6.7 6.5  NEUTROABS 5.0  --   HGB 11.9* 11.3*  HCT 35.3* 34.2*  MCV 82.9 81.8  PLT 253 230   Cardiac Enzymes:  Recent Labs Lab 01/30/13 2355 01/31/13 0458 01/31/13 0909 01/31/13 1448  TROPONINI <0.30 <0.30 <0.30 <0.30   BNP: BNP (last 3 results)  Recent Labs  01/31/13 0114  PROBNP 129.7   CBG: No results found for this basename: GLUCAP,  in the last 168 hours     Signed:  Rhetta Mura  [facility] 02/01/2013, 10:26 AM

## 2013-02-07 ENCOUNTER — Encounter: Payer: Self-pay | Admitting: Internal Medicine

## 2013-02-07 ENCOUNTER — Ambulatory Visit (INDEPENDENT_AMBULATORY_CARE_PROVIDER_SITE_OTHER): Payer: Medicare Other | Admitting: Internal Medicine

## 2013-02-07 VITALS — BP 110/70 | HR 72 | Temp 98.4°F | Resp 20 | Wt 146.0 lb

## 2013-02-07 DIAGNOSIS — I10 Essential (primary) hypertension: Secondary | ICD-10-CM

## 2013-02-07 DIAGNOSIS — I509 Heart failure, unspecified: Secondary | ICD-10-CM

## 2013-02-07 DIAGNOSIS — Z5189 Encounter for other specified aftercare: Secondary | ICD-10-CM

## 2013-02-07 DIAGNOSIS — M069 Rheumatoid arthritis, unspecified: Secondary | ICD-10-CM

## 2013-02-07 LAB — BASIC METABOLIC PANEL
BUN: 28 mg/dL — ABNORMAL HIGH (ref 6–23)
Calcium: 10 mg/dL (ref 8.4–10.5)
Creatinine, Ser: 1.4 mg/dL — ABNORMAL HIGH (ref 0.4–1.2)
GFR: 39.5 mL/min — ABNORMAL LOW (ref >60.00)
Glucose, Bld: 109 mg/dL — ABNORMAL HIGH (ref 70–99)
Sodium: 138 mEq/L (ref 135–145)

## 2013-02-07 NOTE — Progress Notes (Signed)
Subjective:    Patient ID: [name] [name], female    DOB: 1935/06/24, 78 y.o.   MRN: [id number]  HPI  78 year old patient who is seen following a hospital discharge. She was discharged from the hospital 6 days ago after presenting with abdominal chest pain and shortness of breath. She states she had several episodes of lower abdominal discomfort that radiated to the chest and neck area and was associated with nausea weakness diaphoresis and shortness of breath. She denies any history of exertional shortness of breath. She does have a history of rheumatoid arthritis and had been on methotrexate therapy.  Evaluation included a chest x-ray that suggested mild vascular congestion. A 2-D echocardiogram revealed a normal ejection fraction and mild diastolic dysfunction. BNP was minimally elevated at 170.  Since her discharge she has done quite well. Methotrexate has been placed on hold for possible diagnosis of methotrexate associated lung injury. Her RA has been stable she remains on Plaquenil therapy. Denies any cough or shortness of breath. Denies any prior or present history of DOE. A diagnosis of mild CHF was also entertained  Past Medical History  Diagnosis Date  . HYPERLIPIDEMIA 01/19/2007  . HYPERTENSION 01/19/2007  . HYPOTHYROIDISM 01/19/2007  . Rheumatoid arthritis(714.0) 01/19/2007    History   Social History  . Marital Status: Married    Spouse Name: N/A    Number of Children: N/A  . Years of Education: N/A   Occupational History  . Not on file.   Social History Main Topics  . Smoking status: Never Smoker   . Smokeless tobacco: Never Used  . Alcohol Use: No  . Drug Use: No  . Sexually Active: Not on file   Other Topics Concern  . Not on file   Social History Narrative  . No narrative on file    Past Surgical History  Procedure Laterality Date  . Appendectomy    . Cholecystectomy    . Abdominal hysterectomy    . Neck surgery      No family history on  file.  Allergies  Allergen Reactions  . Tramadol Nausea Only  . Codeine Phosphate     REACTION: unspecified    Current Outpatient Prescriptions on File Prior to Visit  Medication Sig Dispense Refill  . benazepril-hydrochlorthiazide (LOTENSIN HCT) 10-12.5 MG per tablet TAKE 1 TABLET BY MOUTH DAILY.  90 tablet  0  . folic acid (FOLVITE) 1 MG tablet Take 1 mg by mouth daily.        . furosemide (LASIX) 20 MG tablet Take 1 tablet (20 mg total) by mouth daily.  30 tablet  0  . HYDROcodone-acetaminophen (NORCO/VICODIN) 5-325 MG per tablet Take 1 tablet by mouth every 6 (six) hours as needed for pain.  50 tablet  3  . hydroxychloroquine (PLAQUENIL) 200 MG tablet Take 200 mg by mouth daily.       Marland Kitchen levothyroxine (SYNTHROID, LEVOTHROID) 50 MCG tablet Take 1 tablet (50 mcg total) by mouth daily.  90 tablet  6  . Multiple Vitamins-Minerals (MULTIVITAMIN WITH MINERALS) tablet Take 1 tablet by mouth daily.      Marland Kitchen OVER THE COUNTER MEDICATION Place 1 drop into both eyes daily. "hyper tear"       No current facility-administered medications on file prior to visit.    BP 110/70  Pulse 72  Temp(Src) 98.4 F (36.9 C) (Oral)  Resp 20  Wt 146 lb (66.225 kg)  BMI 27.6 kg/m2  SpO2 94%  Review of Systems  Constitutional: Negative.   HENT: Negative for hearing loss, congestion, sore throat, rhinorrhea, dental problem, sinus pressure and tinnitus.   Eyes: Negative for pain, discharge and visual disturbance.  Respiratory: Positive for chest tightness and shortness of breath. Negative for cough.   Cardiovascular: Negative for chest pain, palpitations and leg swelling.  Gastrointestinal: Negative for nausea, vomiting, abdominal pain, diarrhea, constipation, blood in stool and abdominal distention.  Genitourinary: Negative for dysuria, urgency, frequency, hematuria, flank pain, vaginal bleeding, vaginal discharge, difficulty urinating, vaginal pain and pelvic pain.  Musculoskeletal: Negative  for joint swelling, arthralgias and gait problem.  Skin: Negative for rash.  Neurological: Negative for dizziness, syncope, speech difficulty, weakness, numbness and headaches.  Hematological: Negative for adenopathy.  Psychiatric/Behavioral: Negative for behavioral problems, dysphoric mood and agitation. The patient is not nervous/anxious.        Objective:   Physical Exam  Constitutional: She is oriented to person, place, and time. She appears well-developed and well-nourished.  HENT:  Head: Normocephalic.  Right Ear: External ear normal.  Left Ear: External ear normal.  Mouth/Throat: Oropharynx is clear and moist.  Eyes: Conjunctivae and EOM are normal. Pupils are equal, round, and reactive to light.  Neck: Normal range of motion. Neck supple. No JVD present. No thyromegaly present.  Cardiovascular: Normal rate, regular rhythm, normal heart sounds and intact distal pulses.   Pulmonary/Chest: Effort normal. No respiratory distress. She has no wheezes. She has rales.  O2 saturation 98 Pulse 72  Rales noted involving the left mid and lower lobes as well as the right lower lobes  Abdominal: Soft. Bowel sounds are normal. She exhibits no mass. There is no tenderness.  Musculoskeletal: Normal range of motion.  Lymphadenopathy:    She has no cervical adenopathy.  Neurological: She is alert and oriented to person, place, and time.  Skin: Skin is warm and dry. No rash noted.  Psychiatric: She has a normal mood and affect. Her behavior is normal.          Assessment & Plan:   Patient sent with a symptom complex of abdominal and chest pain associated with shortness of breath. Symptoms have not reoccurred. Chest x-ray revealed mild pulmonary vascular congestion. Differential includes methotrexate related lung injury versus mild CHF.  Another possibly more likely diagnosis  would be mild interstitial lung disease in the setting RA.  She remains asymptomatic without dyspnea on exertion  and has normal oxygen saturations.  Options discussed with the patient including cardiology evaluation or pulmonary referral. She wishes to defer at this time.  Will check electrolytes as well as sedimentation rate. We'll encourage prompt  rheumatology followup

## 2013-02-07 NOTE — Progress Notes (Signed)
  Subjective:    Patient ID: [name] [name], female    DOB: 01-06-1935, 78 y.o.   MRN: [id number]  HPI  Wt Readings from Last 3 Encounters:  02/07/13 146 lb (66.225 kg)  02/01/13 142 lb 14.4 oz (64.819 kg)  11/01/12 150 lb (68.04 kg)    Review of Systems     Objective:   Physical Exam        Assessment & Plan:

## 2013-02-07 NOTE — Patient Instructions (Signed)
Limit your sodium (Salt) intake  Rheumatology followup  Call or return to clinic prn if these symptoms worsen or fail to improve as anticipated.

## 2013-02-09 ENCOUNTER — Telehealth: Payer: Self-pay | Admitting: Internal Medicine

## 2013-02-09 NOTE — Telephone Encounter (Signed)
Pt returning your call

## 2013-02-09 NOTE — Telephone Encounter (Signed)
See result message.

## 2013-03-09 ENCOUNTER — Ambulatory Visit: Payer: Medicare Other | Admitting: Internal Medicine

## 2013-06-20 ENCOUNTER — Telehealth: Payer: Self-pay | Admitting: Internal Medicine

## 2013-06-20 NOTE — Telephone Encounter (Signed)
Pt has to be out of town on dec 1 which is the day of her cpe appt. would  Like to get this end before the end of the year for insurance purposes. Is it ok to resc?

## 2013-06-20 NOTE — Telephone Encounter (Signed)
Yes

## 2013-06-24 NOTE — Telephone Encounter (Signed)
appt scheduled/kh °

## 2013-07-04 ENCOUNTER — Encounter: Payer: Medicare Other | Admitting: Internal Medicine

## 2013-07-06 ENCOUNTER — Ambulatory Visit (INDEPENDENT_AMBULATORY_CARE_PROVIDER_SITE_OTHER): Payer: Medicare Other | Admitting: Internal Medicine

## 2013-07-06 ENCOUNTER — Encounter: Payer: Self-pay | Admitting: Internal Medicine

## 2013-07-06 VITALS — BP 120/80 | HR 78 | Temp 98.0°F | Resp 20 | Ht 60.75 in | Wt 147.0 lb

## 2013-07-06 DIAGNOSIS — E039 Hypothyroidism, unspecified: Secondary | ICD-10-CM

## 2013-07-06 DIAGNOSIS — Z0000 Encounter for general adult medical examination without abnormal findings: Secondary | ICD-10-CM

## 2013-07-06 DIAGNOSIS — I10 Essential (primary) hypertension: Secondary | ICD-10-CM

## 2013-07-06 DIAGNOSIS — E785 Hyperlipidemia, unspecified: Secondary | ICD-10-CM

## 2013-07-06 DIAGNOSIS — M069 Rheumatoid arthritis, unspecified: Secondary | ICD-10-CM

## 2013-07-06 DIAGNOSIS — Z23 Encounter for immunization: Secondary | ICD-10-CM

## 2013-07-06 MED ORDER — BENAZEPRIL-HYDROCHLOROTHIAZIDE 10-12.5 MG PO TABS: ORAL_TABLET | ORAL | Status: DC

## 2013-07-06 MED ORDER — LEVOTHYROXINE SODIUM 50 MCG PO TABS: 50.0000 ug | ORAL_TABLET | Freq: Every day | ORAL | Status: DC

## 2013-07-06 NOTE — Progress Notes (Signed)
Patient ID: [name] [name], female   DOB: Oct 23, 1934, 78 y.o.   MRN: [id number]  Subjective:    Patient ID: [name] [name], female    DOB: September 14, 1934, 78 y.o.   MRN: [id number]  Hypertension Associated symptoms include headaches (patient has experienced some headache since starting Plaquenil therapy. These have resolved since brief discontinuation). Pertinent negatives include no chest pain, palpitations or shortness of breath.  Hyperlipidemia Pertinent negatives include no chest pain or shortness of breath.  69 -year-old patient who is seen today for an annual physical. She is followed by rheumatology for RA which has been stable. She remains on methotrexate therapy. She has treated hypertension and dyslipidemia which have been well controlled she remains on simvastatin. She has hypothyroidism controlled on supplemental levothyroxine. She is doing quite well today. In May of 2013,  she underwent C-spine surgery for spurs in her radiculopathy has nicely resolved. Since her last visit here she has been placed on Plaquenil by rheumatology and they are requesting a Pneumovax booster. She also has seen Dr. Luciana Axe for a retinal surgery in October of last year   Allergies:  1) Codeine Phosphate (Codeine Phosphate)   Past History:  Past Medical History:  Hyperlipidemia  Hypertension  Hypothyroidism  Rheumatoid arthritis 2002  Menopausal syndrome   Past Surgical History:   Appendectomy  Cholecystectomy laparoscopic 2002  Hysterectomy age 49  colonoscopy in May 2009 C-spine surgery May 2012 4 left-sided radiculopathy ([facility]) Retinal surgery October 2013 ([facility])   Family History:   father died at age 3, MI, history of diabetes  mother died age 70, status post hip fracture  grandparents, strongly positive for diabetes  Six brothers 3 sisters  three brothers deceased, age 20 , 62, MI; one brother had prostate cancer, age 20  For prostate cancer or heart disease and cerebrovascular  disease   Social History:  Reviewed history and no changes required.   1. Risk factors, based on past  M,S,F history-  cardiovascular risk factors include hypertension dyslipidemia and a family history of coronary artery disease  2.  Physical activities: Remains fairly active her rheumatoid arthritis is well controlled  3.  Depression/mood: No history of depression or mood disorder  4.  Hearing: No deficits  5.  ADL's: Independent in all aspects of daily living  6.  Fall risk: Low  7.  Home safety: No problems identified  8.  Height weight, and visual acuity; height and weight are stable no change in visual acuity  9.  Counseling: calcium and vitamin D supplementation encouraged regular exercise regimen recommended  10. Lab orders based on risk factors: Laboratory profile including lipid panel and TSH will be reviewed  11. Referral :  Followup dermatology. Is scheduled for mammogram tomorrow  12. Care plan: Continue restricted salt diet home blood pressure monitoring  13. Cognitive assessment: Alert and oriented with normal affect. No cognitive dysfunction   Past Medical History  Diagnosis Date  . HYPERLIPIDEMIA 01/19/2007  . HYPERTENSION 01/19/2007  . HYPOTHYROIDISM 01/19/2007  . Rheumatoid arthritis(714.0) 01/19/2007    History   Social History  . Marital Status: Married    Spouse Name: N/A    Number of Children: N/A  . Years of Education: N/A   Occupational History  . Not on file.   Social History Main Topics  . Smoking status: Never Smoker   . Smokeless tobacco: Never Used  . Alcohol Use: No  . Drug Use: No  . Sexual Activity: Not on file  Other Topics Concern  . Not on file   Social History Narrative  . No narrative on file    Past Surgical History  Procedure Laterality Date  . Appendectomy    . Cholecystectomy    . Abdominal hysterectomy    . Neck surgery      No family history on file.  Allergies  Allergen Reactions  . Tramadol Nausea  Only  . Codeine Phosphate     REACTION: unspecified    Current Outpatient Prescriptions on File Prior to Visit  Medication Sig Dispense Refill  . folic acid (FOLVITE) 1 MG tablet Take 1 mg by mouth daily.        . hydroxychloroquine (PLAQUENIL) 200 MG tablet Take 200 mg by mouth daily.       . Multiple Vitamins-Minerals (MULTIVITAMIN WITH MINERALS) tablet Take 1 tablet by mouth daily.      Marland Kitchen OVER THE COUNTER MEDICATION Place 1 drop into both eyes daily. "hyper tear"       No current facility-administered medications on file prior to visit.    BP 120/80  Pulse 78  Temp(Src) 98 F (36.7 C) (Oral)  Resp 20  Ht 5' 0.75" (1.543 m)  Wt 147 lb (66.679 kg)  BMI 28.01 kg/m2  SpO2 98%      Review of Systems  Constitutional: Negative for fever, appetite change, fatigue and unexpected weight change.  HENT: Negative for congestion, dental problem, ear pain, hearing loss, mouth sores, nosebleeds, sinus pressure, sore throat, tinnitus, trouble swallowing and voice change.   Eyes: Negative for photophobia, pain, redness and visual disturbance.  Respiratory: Negative for cough, chest tightness and shortness of breath.   Cardiovascular: Negative for chest pain, palpitations and leg swelling.  Gastrointestinal: Negative for nausea, vomiting, abdominal pain, diarrhea, constipation, blood in stool, abdominal distention and rectal pain.  Genitourinary: Negative for dysuria, urgency, frequency, hematuria, flank pain, vaginal bleeding, vaginal discharge, difficulty urinating, genital sores, vaginal pain, menstrual problem and pelvic pain.  Musculoskeletal: Negative for arthralgias, back pain and neck stiffness.  Skin: Negative for rash.  Neurological: Positive for headaches (patient has experienced some headache since starting Plaquenil therapy. These have resolved since brief discontinuation). Negative for dizziness, syncope, speech difficulty, weakness, light-headedness and numbness.   Hematological: Negative for adenopathy. Does not bruise/bleed easily.  Psychiatric/Behavioral: Negative for suicidal ideas, behavioral problems, self-injury, dysphoric mood and agitation. The patient is not nervous/anxious.        Objective:   Physical Exam  Constitutional: She is oriented to person, place, and time. She appears well-developed and well-nourished. No distress.  Blood pressure low normal  HENT:  Head: Normocephalic and atraumatic.  Right Ear: External ear normal.  Left Ear: External ear normal.  Mouth/Throat: Oropharynx is clear and moist.  Eyes: Conjunctivae and EOM are normal.  Neck: Normal range of motion. Neck supple. No JVD present. No thyromegaly present.  Cardiovascular: Normal rate, regular rhythm, normal heart sounds and intact distal pulses.   No murmur heard. Pulmonary/Chest: Effort normal and breath sounds normal. She has no wheezes. She has no rales.  Abdominal: Soft. Bowel sounds are normal. She exhibits no distension and no mass. There is no tenderness. There is no rebound and no guarding.  Musculoskeletal: Normal range of motion. She exhibits no edema and no tenderness.  Neurological: She is alert and oriented to person, place, and time. She has normal reflexes. No cranial nerve deficit. She exhibits normal muscle tone. Coordination normal.  Skin: Skin is warm and dry. No rash  noted.  Psychiatric: She has a normal mood and affect. Her behavior is normal.          Assessment & Plan:   Preventive health examination Hypertension stable Dyslipidemia. We'll check lipid profile Hypothyroidism. We'll check TSH Rheumatoid arthritis stable. Followup rheumatology

## 2013-07-06 NOTE — Progress Notes (Signed)
Pre-visit discussion using our clinic review tool. No additional management support is needed unless otherwise documented below in the visit note.  

## 2013-07-06 NOTE — Patient Instructions (Signed)
Limit your sodium (Salt) intake  Please check your blood pressure on a regular basis.  If it is consistently greater than 150/90, please make an office appointment.  Take a calcium supplement, plus [phone] units of vitamin D  Return in one year for follow-up

## 2014-01-16 LAB — HM DEXA SCAN

## 2014-03-01 ENCOUNTER — Encounter: Payer: Self-pay | Admitting: Internal Medicine

## 2014-04-27 LAB — HM MAMMOGRAPHY

## 2014-04-28 ENCOUNTER — Encounter: Payer: Self-pay | Admitting: Internal Medicine

## 2014-05-12 ENCOUNTER — Other Ambulatory Visit: Payer: Self-pay | Admitting: Internal Medicine

## 2014-05-13 ENCOUNTER — Other Ambulatory Visit: Payer: Self-pay | Admitting: Internal Medicine

## 2014-05-23 ENCOUNTER — Telehealth: Payer: Self-pay | Admitting: Internal Medicine

## 2014-05-23 NOTE — Telephone Encounter (Signed)
Pt called to ask for a copy of her med list she need it for insurance purposes.

## 2014-05-24 NOTE — Telephone Encounter (Signed)
Spoke to pt, told her I have her med list for her. Pt said need to update list, pt said she is not on Methotrexate or Plaquenil anymore her Rheumatologist stopped them. Told her okay updated list and would you like me to mail to you. Pt said yes. Told her okay will put in mail today. Pt verbalized understanding.

## 2014-06-13 ENCOUNTER — Telehealth: Payer: Self-pay | Admitting: Internal Medicine

## 2014-06-13 MED ORDER — BENAZEPRIL-HYDROCHLOROTHIAZIDE 10-12.5 MG PO TABS: ORAL_TABLET | ORAL | Status: DC

## 2014-06-13 NOTE — Telephone Encounter (Signed)
Tried to contact pt, no answer. Will try again later. Rx sent to pharmacy.

## 2014-06-13 NOTE — Telephone Encounter (Signed)
[place] is going [place] and would like to know if you could refill benazepril-hydrochlorthiazide (LOTENSIN HCT) 10-12.5 MG per tablet [facility] [address]

## 2014-06-14 NOTE — Telephone Encounter (Signed)
Left detailed message Rx sent to pharmacy yesterday.

## 2014-06-16 NOTE — Telephone Encounter (Signed)
Closed opened in error

## 2014-07-04 ENCOUNTER — Telehealth: Payer: Self-pay | Admitting: Internal Medicine

## 2014-07-04 NOTE — Telephone Encounter (Signed)
Spoke to Rains, told her we have not since the pt since last Jul 06, 2013. The diagnosis is on her chart CHF acute, but other than that I do not have any recent results. Levada Dy verbalized understanding and asked if pt had Echo done? Found result of Echo done 01/2013 and gave her result and who ordered. Levada Dy verbalized understanding.

## 2014-07-04 NOTE — Telephone Encounter (Signed)
Levada Dy, nurse with [facility] states pt has contacted them to enroll in their program however, the nurse does not have any documentation showing she has been diagnosed with heart failure and needs to confirm if she does.

## 2014-07-10 ENCOUNTER — Encounter: Payer: Medicare Other | Admitting: Internal Medicine

## 2014-08-24 ENCOUNTER — Encounter: Payer: Medicare Other | Admitting: Internal Medicine

## 2014-09-23 ENCOUNTER — Other Ambulatory Visit: Payer: Self-pay | Admitting: Internal Medicine

## 2014-10-19 ENCOUNTER — Ambulatory Visit (INDEPENDENT_AMBULATORY_CARE_PROVIDER_SITE_OTHER): Payer: Medicare Other | Admitting: Internal Medicine

## 2014-10-19 ENCOUNTER — Encounter: Payer: Self-pay | Admitting: Internal Medicine

## 2014-10-19 VITALS — BP 130/80 | HR 80 | Temp 97.9°F | Resp 18 | Ht 60.5 in | Wt 151.0 lb

## 2014-10-19 DIAGNOSIS — E785 Hyperlipidemia, unspecified: Secondary | ICD-10-CM

## 2014-10-19 DIAGNOSIS — E039 Hypothyroidism, unspecified: Secondary | ICD-10-CM | POA: Diagnosis not present

## 2014-10-19 DIAGNOSIS — I10 Essential (primary) hypertension: Secondary | ICD-10-CM

## 2014-10-19 DIAGNOSIS — Z0000 Encounter for general adult medical examination without abnormal findings: Secondary | ICD-10-CM

## 2014-10-19 DIAGNOSIS — M069 Rheumatoid arthritis, unspecified: Secondary | ICD-10-CM | POA: Diagnosis not present

## 2014-10-19 LAB — CBC WITH DIFFERENTIAL/PLATELET
BASOS ABS: 0 10*3/uL (ref 0.0–0.1)
Basophils Relative: 0.8 % (ref 0.0–3.0)
Eosinophils Absolute: 0.1 10*3/uL (ref 0.0–0.7)
Eosinophils Relative: 2.3 % (ref 0.0–5.0)
HCT: 38.7 % (ref 36.0–46.0)
Hemoglobin: 12.8 g/dL (ref 12.0–15.0)
LYMPHS ABS: 2.1 10*3/uL (ref 0.7–4.0)
LYMPHS PCT: 34.8 % (ref 12.0–46.0)
MCHC: 33 g/dL (ref 30.0–36.0)
MCV: 78.6 fl (ref 78.0–100.0)
MONO ABS: 0.6 10*3/uL (ref 0.1–1.0)
Monocytes Relative: 10.3 % (ref 3.0–12.0)
NEUTROS ABS: 3.1 10*3/uL (ref 1.4–7.7)
Neutrophils Relative %: 51.8 % (ref 43.0–77.0)
Platelets: 275 10*3/uL (ref 150.0–400.0)
RBC: 4.92 Mil/uL (ref 3.87–5.11)
RDW: 15.1 % (ref 11.5–15.5)
WBC: 5.9 10*3/uL (ref 4.0–10.5)

## 2014-10-19 LAB — LIPID PANEL
CHOL/HDL RATIO: 4
Cholesterol: 276 mg/dL — ABNORMAL HIGH (ref 0–200)
HDL: 68.1 mg/dL (ref >39.00)
LDL Cholesterol: 183 mg/dL — ABNORMAL HIGH (ref 0–99)
NONHDL: 207.9
TRIGLYCERIDES: 123 mg/dL (ref 0.0–149.0)
VLDL: 24.6 mg/dL (ref 0.0–40.0)

## 2014-10-19 LAB — COMPREHENSIVE METABOLIC PANEL
ALK PHOS: 62 U/L (ref 39–117)
ALT: 10 U/L (ref 0–35)
AST: 20 U/L (ref 0–37)
Albumin: 4.2 g/dL (ref 3.5–5.2)
BILIRUBIN TOTAL: 0.6 mg/dL (ref 0.2–1.2)
BUN: 26 mg/dL — ABNORMAL HIGH (ref 6–23)
CHLORIDE: 104 meq/L (ref 96–112)
CO2: 30 meq/L (ref 19–32)
CREATININE: 1.23 mg/dL — AB (ref 0.40–1.20)
Calcium: 9.7 mg/dL (ref 8.4–10.5)
GFR: 44.9 mL/min — ABNORMAL LOW (ref >60.00)
GLUCOSE: 97 mg/dL (ref 70–99)
Potassium: 4.4 mEq/L (ref 3.5–5.1)
Sodium: 139 mEq/L (ref 135–145)
Total Protein: 7.7 g/dL (ref 6.0–8.3)

## 2014-10-19 LAB — TSH: TSH: 3.94 u[IU]/mL (ref 0.35–4.50)

## 2014-10-19 NOTE — Progress Notes (Signed)
Pre visit review using our clinic review tool, if applicable. No additional management support is needed unless otherwise documented below in the visit note. 

## 2014-10-19 NOTE — Patient Instructions (Signed)
Limit your sodium (Salt) intake    It is important that you exercise regularly, at least 20 minutes 3 to 4 times per week.  If you develop chest pain or shortness of breath seek  medical attention.  Return in one year for follow-up  Health Maintenance Adopting a healthy lifestyle and getting preventive care can go a long way to promote health and wellness. Talk with your health care provider about what schedule of regular examinations is right for you. This is a good chance for you to check in with your provider about disease prevention and staying healthy. In between checkups, there are plenty of things you can do on your own. Experts have done a lot of research about which lifestyle changes and preventive measures are most likely to keep you healthy. Ask your health care provider for more information. WEIGHT AND DIET  Eat a healthy diet  Be sure to include plenty of vegetables, fruits, low-fat dairy products, and lean protein.  Do not eat a lot of foods high in solid fats, added sugars, or salt.  Get regular exercise. This is one of the most important things you can do for your health.  Most adults should exercise for at least 150 minutes each week. The exercise should increase your heart rate and make you sweat (moderate-intensity exercise).  Most adults should also do strengthening exercises at least twice a week. This is in addition to the moderate-intensity exercise.  Maintain a healthy weight  Body mass index (BMI) is a measurement that can be used to identify possible weight problems. It estimates body fat based on height and weight. Your health care provider can help determine your BMI and help you achieve or maintain a healthy weight.  For females 20 years of age and older:   A BMI below 18.5 is considered underweight.  A BMI of 18.5 to 24.9 is normal.  A BMI of 25 to 29.9 is considered overweight.  A BMI of 30 and above is considered obese.  Watch levels of cholesterol  and blood lipids  You should start having your blood tested for lipids and cholesterol at 78 years of age, then have this test every 5 years.  You may need to have your cholesterol levels checked more often if:  Your lipid or cholesterol levels are high.  You are older than 78 years of age.  You are at high risk for heart disease.  CANCER SCREENING   Lung Cancer  Lung cancer screening is recommended for adults 55-80 years old who are at high risk for lung cancer because of a history of smoking.  A yearly low-dose CT scan of the lungs is recommended for people who:  Currently smoke.  Have quit within the past 15 years.  Have at least a 30-pack-year history of smoking. A pack year is smoking an average of one pack of cigarettes a day for 1 year.  Yearly screening should continue until it has been 15 years since you quit.  Yearly screening should stop if you develop a health problem that would prevent you from having lung cancer treatment.  Breast Cancer  Practice breast self-awareness. This means understanding how your breasts normally appear and feel.  It also means doing regular breast self-exams. Let your health care provider know about any changes, no matter how small.  If you are in your 20s or 30s, you should have a clinical breast exam (CBE) by a health care provider every 1-3 years as part of a   regular health exam.  If you are 40 or older, have a CBE every year. Also consider having a breast X-ray (mammogram) every year.  If you have a family history of breast cancer, talk to your health care provider about genetic screening.  If you are at high risk for breast cancer, talk to your health care provider about having an MRI and a mammogram every year.  Breast cancer gene (BRCA) assessment is recommended for women who have family members with BRCA-related cancers. BRCA-related cancers include:  Breast.  Ovarian.  Tubal.  Peritoneal cancers.  Results of the  assessment will determine the need for genetic counseling and BRCA1 and BRCA2 testing. Cervical Cancer Routine pelvic examinations to screen for cervical cancer are no longer recommended for nonpregnant women who are considered low risk for cancer of the pelvic organs (ovaries, uterus, and vagina) and who do not have symptoms. A pelvic examination may be necessary if you have symptoms including those associated with pelvic infections. Ask your health care provider if a screening pelvic exam is right for you.   The Pap test is the screening test for cervical cancer for women who are considered at risk.  If you had a hysterectomy for a problem that was not cancer or a condition that could lead to cancer, then you no longer need Pap tests.  If you are older than 65 years, and you have had normal Pap tests for the past 10 years, you no longer need to have Pap tests.  If you have had past treatment for cervical cancer or a condition that could lead to cancer, you need Pap tests and screening for cancer for at least 20 years after your treatment.  If you no longer get a Pap test, assess your risk factors if they change (such as having a new sexual partner). This can affect whether you should start being screened again.  Some women have medical problems that increase their chance of getting cervical cancer. If this is the case for you, your health care provider may recommend more frequent screening and Pap tests.  The human papillomavirus (HPV) test is another test that may be used for cervical cancer screening. The HPV test looks for the virus that can cause cell changes in the cervix. The cells collected during the Pap test can be tested for HPV.  The HPV test can be used to screen women 30 years of age and older. Getting tested for HPV can extend the interval between normal Pap tests from three to five years.  An HPV test also should be used to screen women of any age who have unclear Pap test  results.  After 78 years of age, women should have HPV testing as often as Pap tests.  Colorectal Cancer  This type of cancer can be detected and often prevented.  Routine colorectal cancer screening usually begins at 78 years of age and continues through 78 years of age.  Your health care provider may recommend screening at an earlier age if you have risk factors for colon cancer.  Your health care provider may also recommend using home test kits to check for hidden blood in the stool.  A small camera at the end of a tube can be used to examine your colon directly (sigmoidoscopy or colonoscopy). This is done to check for the earliest forms of colorectal cancer.  Routine screening usually begins at age 50.  Direct examination of the colon should be repeated every 5-10 years through 78   years of age. However, you may need to be screened more often if early forms of precancerous polyps or small growths are found. Skin Cancer  Check your skin from head to toe regularly.  Tell your health care provider about any new moles or changes in moles, especially if there is a change in a mole's shape or color.  Also tell your health care provider if you have a mole that is larger than the size of a pencil eraser.  Always use sunscreen. Apply sunscreen liberally and repeatedly throughout the day.  Protect yourself by wearing long sleeves, pants, a wide-brimmed hat, and sunglasses whenever you are outside. HEART DISEASE, DIABETES, AND HIGH BLOOD PRESSURE   Have your blood pressure checked at least every 1-2 years. High blood pressure causes heart disease and increases the risk of stroke.  If you are between 55 years and 79 years old, ask your health care provider if you should take aspirin to prevent strokes.  Have regular diabetes screenings. This involves taking a blood sample to check your fasting blood sugar level.  If you are at a normal weight and have a low risk for diabetes, have this  test once every three years after 78 years of age.  If you are overweight and have a high risk for diabetes, consider being tested at a younger age or more often. PREVENTING INFECTION  Hepatitis B  If you have a higher risk for hepatitis B, you should be screened for this virus. You are considered at high risk for hepatitis B if:  You were born in a country where hepatitis B is common. Ask your health care provider which countries are considered high risk.  Your parents were born in a high-risk country, and you have not been immunized against hepatitis B (hepatitis B vaccine).  You have HIV or AIDS.  You use needles to inject street drugs.  You live with someone who has hepatitis B.  You have had sex with someone who has hepatitis B.  You get hemodialysis treatment.  You take certain medicines for conditions, including cancer, organ transplantation, and autoimmune conditions. Hepatitis C  Blood testing is recommended for:  Everyone born from 1945 through 1965.  Anyone with known risk factors for hepatitis C. Sexually transmitted infections (STIs)  You should be screened for sexually transmitted infections (STIs) including gonorrhea and chlamydia if:  You are sexually active and are younger than 78 years of age.  You are older than 78 years of age and your health care provider tells you that you are at risk for this type of infection.  Your sexual activity has changed since you were last screened and you are at an increased risk for chlamydia or gonorrhea. Ask your health care provider if you are at risk.  If you do not have HIV, but are at risk, it may be recommended that you take a prescription medicine daily to prevent HIV infection. This is called pre-exposure prophylaxis (PrEP). You are considered at risk if:  You are sexually active and do not regularly use condoms or know the HIV status of your partner(s).  You take drugs by injection.  You are sexually active with  a partner who has HIV. Talk with your health care provider about whether you are at high risk of being infected with HIV. If you choose to begin PrEP, you should first be tested for HIV. You should then be tested every 3 months for as long as you are taking PrEP.    PREGNANCY   If you are premenopausal and you may become pregnant, ask your health care provider about preconception counseling.  If you may become pregnant, take 400 to 800 micrograms (mcg) of folic acid every day.  If you want to prevent pregnancy, talk to your health care provider about birth control (contraception). OSTEOPOROSIS AND MENOPAUSE   Osteoporosis is a disease in which the bones lose minerals and strength with aging. This can result in serious bone fractures. Your risk for osteoporosis can be identified using a bone density scan.  If you are 65 years of age or older, or if you are at risk for osteoporosis and fractures, ask your health care provider if you should be screened.  Ask your health care provider whether you should take a calcium or vitamin D supplement to lower your risk for osteoporosis.  Menopause may have certain physical symptoms and risks.  Hormone replacement therapy may reduce some of these symptoms and risks. Talk to your health care provider about whether hormone replacement therapy is right for you.  HOME CARE INSTRUCTIONS   Schedule regular health, dental, and eye exams.  Stay current with your immunizations.   Do not use any tobacco products including cigarettes, chewing tobacco, or electronic cigarettes.  If you are pregnant, do not drink alcohol.  If you are breastfeeding, limit how much and how often you drink alcohol.  Limit alcohol intake to no more than 1 drink per day for nonpregnant women. One drink equals 12 ounces of beer, 5 ounces of wine, or 1 ounces of hard liquor.  Do not use street drugs.  Do not share needles.  Ask your health care provider for help if you need  support or information about quitting drugs.  Tell your health care provider if you often feel depressed.  Tell your health care provider if you have ever been abused or do not feel safe at home. Document Released: 02/03/2011 Document Revised: 12/05/2013 Document Reviewed: 06/22/2013 [employer] Patient Information 2015 [employer]. This information is not intended to replace advice given to you by your health care provider. Make sure you discuss any questions you have with your health care provider.  

## 2014-10-19 NOTE — Progress Notes (Signed)
Patient ID: [name] [name], female   DOB: [birth year], 80 y.o.   MRN: [id number]  Subjective:    Patient ID: [name] [name], female    DOB: September 28, 1934, 80 y.o.   MRN: [id number]  Hypertension Associated symptoms include headaches (patient has experienced some headache since starting Plaquenil therapy. These have resolved since brief discontinuation). Pertinent negatives include no chest pain, palpitations or shortness of breath.  Hyperlipidemia Pertinent negatives include no chest pain or shortness of breath.   89 -year-old patient who is seen today for an annual physical.  She is followed by rheumatology for RA which has been stable. She has been on methotrexate therapy. She has treated hypertension and dyslipidemia which have been well controlled;  she remains on simvastatin. She has hypothyroidism controlled on supplemental levothyroxine. She is doing quite well today. In May of 2013,  she underwent C-spine surgery for spurs and her radiculopathy has nicely resolved. Since her last visit here,  Plaquenil has been discontinued by rheumatology.  The patient's RA is now in remission.   She also has seen Dr. Zadie Rhine  following retinal surgery in October of 2014 , for follow-up  Doing quite well today without concerns or complaints  Allergies:  1) Codeine Phosphate (Codeine Phosphate)   Past History:  Past Medical History:  Hyperlipidemia  Hypertension  Hypothyroidism  Rheumatoid arthritis 2002  Menopausal syndrome   Past Surgical History:   Appendectomy  Cholecystectomy laparoscopic 2002  Hysterectomy age 67  colonoscopy in May 2009 C-spine surgery May 2012 4 left-sided radiculopathy ([facility]) Retinal surgery October 2013 ([facility])   Family History:   father died at age 40, MI, history of diabetes  mother died age 13, status post hip fracture  grandparents, strongly positive for diabetes  Six brothers 3 sisters  three brothers deceased, age 38 , 71, MI; one brother had prostate  cancer, age 83  For prostate cancer or heart disease and cerebrovascular disease   Social History:  Reviewed history and no changes required.   1. Risk factors, based on past  M,S,F history-  cardiovascular risk factors include hypertension dyslipidemia and a family history of coronary artery disease  2.  Physical activities: Remains fairly active;  her rheumatoid arthritis is well controlled.  Able to perform routine household chores  3.  Depression/mood: No history of depression or mood disorder  4.  Hearing: No deficits  5.  ADL's: Independent in all aspects of daily living  6.  Fall risk: Low  7.  Home safety: No problems identified  8.  Height weight, and visual acuity; height and weight are stable no change in visual acuity  9.  Counseling: calcium and vitamin D supplementation encouraged regular exercise regimen recommended  10. Lab orders based on risk factors: Laboratory profile including lipid panel and TSH will be reviewed  11. Referral :  Followup dermatology. Is scheduled for mammogram tomorrow.  Follow-up ophthalmology and rheumatology  12. Care plan: Continue restricted salt diet home blood pressure monitoring  13. Cognitive assessment: Alert and oriented with normal affect. No cognitive dysfunction  14.  Preventive services will include annual clinical exams with screening lab.  She'll be seen by ophthalmology annually.  Patient was provided with a written and personalized care plan  15.  Provider list updated.  Includes primary care ophthalmology.  Rheumatology and radiology   Past Medical History  Diagnosis Date  . HYPERLIPIDEMIA 01/19/2007  . HYPERTENSION 01/19/2007  . HYPOTHYROIDISM 01/19/2007  . Rheumatoid arthritis(714.0) 01/19/2007    History  Social History  . Marital Status: Married    Spouse Name: N/A  . Number of Children: N/A  . Years of Education: N/A   Occupational History  . Not on file.   Social History Main Topics  . Smoking  status: Never Smoker   . Smokeless tobacco: Never Used  . Alcohol Use: No  . Drug Use: No  . Sexual Activity: Not on file   Other Topics Concern  . Not on file   Social History Narrative    Past Surgical History  Procedure Laterality Date  . Appendectomy    . Cholecystectomy    . Abdominal hysterectomy    . Neck surgery      No family history on file.  Allergies  Allergen Reactions  . Tramadol Nausea Only  . Codeine Phosphate     REACTION: unspecified    Current Outpatient Prescriptions on File Prior to Visit  Medication Sig Dispense Refill  . benazepril-hydrochlorthiazide (LOTENSIN HCT) 10-12.5 MG per tablet TAKE 1 TABLET BY MOUTH DAILY. 90 tablet 1  . folic acid (FOLVITE) 1 MG tablet Take 1 mg by mouth daily.      Marland Kitchen levothyroxine (SYNTHROID, LEVOTHROID) 50 MCG tablet TAKE 1 TABLET BY MOUTH EVERY DAY 90 tablet 1  . Multiple Vitamins-Minerals (MULTIVITAMIN WITH MINERALS) tablet Take 1 tablet by mouth daily.    Marland Kitchen OVER THE COUNTER MEDICATION Place 1 drop into both eyes daily. "hyper tear"     No current facility-administered medications on file prior to visit.    There were no vitals taken for this visit.      Review of Systems  Constitutional: Negative for fever, appetite change, fatigue and unexpected weight change.  HENT: Negative for congestion, dental problem, ear pain, hearing loss, mouth sores, nosebleeds, sinus pressure, sore throat, tinnitus, trouble swallowing and voice change.   Eyes: Negative for photophobia, pain, redness and visual disturbance.  Respiratory: Negative for cough, chest tightness and shortness of breath.   Cardiovascular: Negative for chest pain, palpitations and leg swelling.  Gastrointestinal: Negative for nausea, vomiting, abdominal pain, diarrhea, constipation, blood in stool, abdominal distention and rectal pain.  Genitourinary: Negative for dysuria, urgency, frequency, hematuria, flank pain, vaginal bleeding, vaginal discharge,  difficulty urinating, genital sores, vaginal pain, menstrual problem and pelvic pain.  Musculoskeletal: Negative for back pain, arthralgias and neck stiffness.  Skin: Negative for rash.  Neurological: Positive for headaches (patient has experienced some headache since starting Plaquenil therapy. These have resolved since brief discontinuation). Negative for dizziness, syncope, speech difficulty, weakness, light-headedness and numbness.  Hematological: Negative for adenopathy. Does not bruise/bleed easily.  Psychiatric/Behavioral: Negative for suicidal ideas, behavioral problems, self-injury, dysphoric mood and agitation. The patient is not nervous/anxious.        Objective:   Physical Exam  Constitutional: She is oriented to person, place, and time. She appears well-developed and well-nourished. No distress.  Blood pressure low normal  HENT:  Head: Normocephalic and atraumatic.  Right Ear: External ear normal.  Left Ear: External ear normal.  Mouth/Throat: Oropharynx is clear and moist.  Eyes: Conjunctivae and EOM are normal.  Neck: Normal range of motion. Neck supple. No JVD present. No thyromegaly present.  Cardiovascular: Normal rate, regular rhythm and intact distal pulses.   Murmur heard. Grade 2/6 systolic murmur Pedal pulses are intact  Pulmonary/Chest: Effort normal and breath sounds normal. She has no wheezes. She has no rales.  Abdominal: Soft. Bowel sounds are normal. She exhibits no distension and no mass. There is no  tenderness. There is no rebound and no guarding.  Musculoskeletal: Normal range of motion. She exhibits no edema or tenderness.  Neurological: She is alert and oriented to person, place, and time. She has normal reflexes. No cranial nerve deficit. She exhibits normal muscle tone. Coordination normal.  Skin: Skin is warm and dry. No rash noted.  Psychiatric: She has a normal mood and affect. Her behavior is normal.          Assessment & Plan:    Preventive health examination Hypertension stable Dyslipidemia. We'll check lipid profile Hypothyroidism. We'll check TSH Rheumatoid arthritis stable. Followup rheumatology

## 2014-10-20 ENCOUNTER — Telehealth: Payer: Self-pay | Admitting: Internal Medicine

## 2014-10-20 NOTE — Telephone Encounter (Signed)
emmi mailed  °

## 2015-02-09 ENCOUNTER — Other Ambulatory Visit: Payer: Self-pay | Admitting: Internal Medicine

## 2015-03-11 ENCOUNTER — Other Ambulatory Visit: Payer: Self-pay | Admitting: Internal Medicine

## 2015-04-15 ENCOUNTER — Other Ambulatory Visit: Payer: Self-pay | Admitting: Internal Medicine

## 2015-07-24 LAB — HM MAMMOGRAPHY

## 2015-08-09 ENCOUNTER — Other Ambulatory Visit (HOSPITAL_COMMUNITY): Payer: Self-pay | Admitting: Rheumatology

## 2015-08-09 DIAGNOSIS — M545 Low back pain: Secondary | ICD-10-CM

## 2015-08-16 ENCOUNTER — Encounter: Payer: Self-pay | Admitting: Internal Medicine

## 2015-08-16 ENCOUNTER — Ambulatory Visit (HOSPITAL_COMMUNITY)
Admission: RE | Admit: 2015-08-16 | Discharge: 2015-08-16 | Disposition: A | Discharge status: Home / Self Care | Payer: Medicare Other | Source: Ambulatory Visit | Attending: Rheumatology | Admitting: Rheumatology

## 2015-08-16 DIAGNOSIS — M2548 Effusion, other site: Secondary | ICD-10-CM | POA: Insufficient documentation

## 2015-08-16 DIAGNOSIS — M5136 Other intervertebral disc degeneration, lumbar region: Secondary | ICD-10-CM | POA: Insufficient documentation

## 2015-08-16 DIAGNOSIS — M545 Low back pain: Secondary | ICD-10-CM | POA: Diagnosis present

## 2015-08-16 DIAGNOSIS — M4806 Spinal stenosis, lumbar region: Secondary | ICD-10-CM | POA: Insufficient documentation

## 2015-08-16 DIAGNOSIS — M5126 Other intervertebral disc displacement, lumbar region: Secondary | ICD-10-CM | POA: Insufficient documentation

## 2015-08-16 DIAGNOSIS — M7138 Other bursal cyst, other site: Secondary | ICD-10-CM | POA: Diagnosis not present

## 2015-10-11 ENCOUNTER — Other Ambulatory Visit: Payer: Self-pay | Admitting: Internal Medicine

## 2015-10-22 ENCOUNTER — Ambulatory Visit (INDEPENDENT_AMBULATORY_CARE_PROVIDER_SITE_OTHER): Payer: Medicare Other | Admitting: Internal Medicine

## 2015-10-22 ENCOUNTER — Encounter: Payer: Self-pay | Admitting: Internal Medicine

## 2015-10-22 VITALS — BP 128/78 | HR 85 | Temp 98.1°F | Resp 20 | Ht 60.25 in | Wt 145.0 lb

## 2015-10-22 DIAGNOSIS — M069 Rheumatoid arthritis, unspecified: Secondary | ICD-10-CM | POA: Diagnosis not present

## 2015-10-22 DIAGNOSIS — E038 Other specified hypothyroidism: Secondary | ICD-10-CM | POA: Diagnosis not present

## 2015-10-22 DIAGNOSIS — E034 Atrophy of thyroid (acquired): Secondary | ICD-10-CM

## 2015-10-22 DIAGNOSIS — E785 Hyperlipidemia, unspecified: Secondary | ICD-10-CM | POA: Diagnosis not present

## 2015-10-22 DIAGNOSIS — Z0000 Encounter for general adult medical examination without abnormal findings: Secondary | ICD-10-CM | POA: Diagnosis not present

## 2015-10-22 DIAGNOSIS — I10 Essential (primary) hypertension: Secondary | ICD-10-CM

## 2015-10-22 LAB — LIPID PANEL
CHOL/HDL RATIO: 4
CHOLESTEROL: 234 mg/dL — AB (ref 0–200)
HDL: 60.6 mg/dL (ref >39.00)
LDL CALC: 150 mg/dL — AB (ref 0–99)
NONHDL: 173.52
Triglycerides: 120 mg/dL (ref 0.0–149.0)
VLDL: 24 mg/dL (ref 0.0–40.0)

## 2015-10-22 LAB — CBC WITH DIFFERENTIAL/PLATELET
BASOS ABS: 0 10*3/uL (ref 0.0–0.1)
BASOS PCT: 0.4 % (ref 0.0–3.0)
EOS ABS: 0.1 10*3/uL (ref 0.0–0.7)
Eosinophils Relative: 1.6 % (ref 0.0–5.0)
HEMATOCRIT: 37.4 % (ref 36.0–46.0)
Hemoglobin: 12 g/dL (ref 12.0–15.0)
LYMPHS ABS: 1.7 10*3/uL (ref 0.7–4.0)
LYMPHS PCT: 24.5 % (ref 12.0–46.0)
MCHC: 32.2 g/dL (ref 30.0–36.0)
MCV: 80.9 fl (ref 78.0–100.0)
Monocytes Absolute: 0.7 10*3/uL (ref 0.1–1.0)
Monocytes Relative: 10.7 % (ref 3.0–12.0)
NEUTROS ABS: 4.4 10*3/uL (ref 1.4–7.7)
NEUTROS PCT: 62.8 % (ref 43.0–77.0)
PLATELETS: 310 10*3/uL (ref 150.0–400.0)
RBC: 4.62 Mil/uL (ref 3.87–5.11)
RDW: 14.6 % (ref 11.5–15.5)
WBC: 7 10*3/uL (ref 4.0–10.5)

## 2015-10-22 LAB — COMPREHENSIVE METABOLIC PANEL
ALT: 9 U/L (ref 0–35)
AST: 17 U/L (ref 0–37)
Albumin: 3.8 g/dL (ref 3.5–5.2)
Alkaline Phosphatase: 67 U/L (ref 39–117)
BILIRUBIN TOTAL: 0.5 mg/dL (ref 0.2–1.2)
BUN: 19 mg/dL (ref 6–23)
CALCIUM: 9.4 mg/dL (ref 8.4–10.5)
CHLORIDE: 99 meq/L (ref 96–112)
CO2: 28 meq/L (ref 19–32)
CREATININE: 1.04 mg/dL (ref 0.40–1.20)
GFR: 54.36 mL/min — ABNORMAL LOW (ref >60.00)
GLUCOSE: 98 mg/dL (ref 70–99)
Potassium: 4.4 mEq/L (ref 3.5–5.1)
SODIUM: 137 meq/L (ref 135–145)
Total Protein: 7 g/dL (ref 6.0–8.3)

## 2015-10-22 LAB — TSH: TSH: 4.96 u[IU]/mL — AB (ref 0.35–4.50)

## 2015-10-22 NOTE — Patient Instructions (Addendum)
It is important that you exercise regularly, at least 20 minutes 3 to 4 times per week.  If you develop chest pain or shortness of breath seek  medical attention.  Take a calcium supplement, plus [phone] units of vitamin D  Follow-up rheumatology as scheduled  Return in one year for follow-up or as needed  Limit your sodium (Salt) intake  Please check your blood pressure on a regular basis.  If it is consistently greater than 150/90, please make an office appointment.

## 2015-10-22 NOTE — Progress Notes (Signed)
Pre visit review using our clinic review tool, if applicable. No additional management support is needed unless otherwise documented below in the visit note. 

## 2015-10-22 NOTE — Progress Notes (Signed)
Patient ID: [name] [name], female   DOB: Oct 28, 1934, 81 y.o.   MRN: [id number]  Subjective:    Patient ID: [name] [name], female    DOB: 1935-04-14, 81 y.o.   MRN: [id number]  Hypertension Pertinent negatives include no chest pain, headaches (patient has experienced some headache since starting Plaquenil therapy. These have resolved since brief discontinuation), palpitations or shortness of breath.  Hyperlipidemia Pertinent negatives include no chest pain or shortness of breath.   80 -year-old patient who is seen today for an annual physical.  She is followed by rheumatology for RA which has been stable. She has been on methotrexate therapy. She has treated hypertension and dyslipidemia which have been well controlled;  she has been treated with  Simvastatin , but this has been discontinued.  She has hypothyroidism controlled on supplemental levothyroxine. She is doing quite well today. In May of 2013,  she underwent C-spine surgery for spurs and her radiculopathy has nicely resolved.   Plaquenil has been discontinued by rheumatology.  The patient's RA is now in remission.   She also has seen Dr. Zadie [name]  following retinal surgery in October of 2014.  She continues to see Dr. Herbert [name].  Doing quite well today without concerns or complaints.  Patient has had a recent mammogram  Allergies:  1) Codeine Phosphate (Codeine Phosphate)   Past History:   Hyperlipidemia  Hypertension  Hypothyroidism  Rheumatoid arthritis 2002  Menopausal syndrome   Past Surgical History:   Appendectomy  Cholecystectomy laparoscopic 2002  Hysterectomy age 70  colonoscopy in May 2009 C-spine surgery May 2012 4 left-sided radiculopathy ([name] [name]) Retinal surgery October 2013 ([name] [name])   Family History:   father died at age 71, MI, history of diabetes  mother died age 66, status post hip fracture  grandparents, strongly positive for diabetes  Six brothers 3 sisters  three brothers deceased, age 28 , 80, MI;  one brother had prostate cancer, age 84    Social History:  Reviewed history and no changes required.   1. Risk factors, based on past  M,S,F history-  cardiovascular risk factors include hypertension dyslipidemia and a family history of coronary artery disease  2.  Physical activities: Remains fairly active;  her rheumatoid arthritis is well controlled.  Able to perform routine household chores  3.  Depression/mood: No history of depression or mood disorder  4.  Hearing: No deficits  5.  ADL's: Independent in all aspects of daily living  6.  Fall risk: Low  7.  Home safety: No problems identified  8.  Height weight, and visual acuity; height and weight are stable no change in visual acuity  9.  Counseling: calcium and vitamin D supplementation encouraged regular exercise regimen recommended  10. Lab orders based on risk factors: Laboratory profile including lipid panel and TSH will be reviewed  11. Referral :  Followup dermatology. Is scheduled for mammogram tomorrow.  Follow-up ophthalmology and rheumatology  12. Care plan: Continue restricted salt diet home blood pressure monitoring  13. Cognitive assessment: Alert and oriented with normal affect. No cognitive dysfunction  14.  Preventive services will include annual clinical exams with screening lab.  She'll be seen by ophthalmology annually.  Patient was provided with a written and personalized care plan  15.  Provider list updated.  Includes primary care ophthalmology.  Rheumatology and radiology   Past Medical History  Diagnosis Date  . HYPERLIPIDEMIA 01/19/2007  . HYPERTENSION 01/19/2007  . HYPOTHYROIDISM 01/19/2007  . Rheumatoid arthritis(714.0) 01/19/2007  Social History   Social History  . Marital Status: Married    Spouse Name: N/A  . Number of Children: N/A  . Years of Education: N/A   Occupational History  . Not on file.   Social History Main Topics  . Smoking status: Never Smoker   . Smokeless  tobacco: Never Used  . Alcohol Use: No  . Drug Use: No  . Sexual Activity: Not on file   Other Topics Concern  . Not on file   Social History Narrative    Past Surgical History  Procedure Laterality Date  . Appendectomy    . Cholecystectomy    . Abdominal hysterectomy    . Neck surgery      No family history on file.  Allergies  Allergen Reactions  . Tramadol Nausea Only  . Codeine Phosphate     REACTION: unspecified    Current Outpatient Prescriptions on File Prior to Visit  Medication Sig Dispense Refill  . benazepril-hydrochlorthiazide (LOTENSIN HCT) 10-12.5 MG tablet TAKE 1 TABLET BY MOUTH DAILY. 90 tablet 1  . folic acid (FOLVITE) 1 MG tablet Take 1 mg by mouth daily.      Marland Kitchen levothyroxine (SYNTHROID, LEVOTHROID) 50 MCG tablet TAKE 1 TABLET BY MOUTH EVERY DAY 90 tablet 1  . levothyroxine (SYNTHROID, LEVOTHROID) 50 MCG tablet TAKE 1 TABLET BY MOUTH EVERY DAY 90 tablet 1  . Multiple Vitamins-Minerals (MULTIVITAMIN WITH MINERALS) tablet Take 1 tablet by mouth daily.    Marland Kitchen OVER THE COUNTER MEDICATION Place 1 drop into both eyes daily. "hyper tear"     No current facility-administered medications on file prior to visit.    There were no vitals taken for this visit.      Review of Systems  Constitutional: Negative for fever, appetite change, fatigue and unexpected weight change.  HENT: Negative for congestion, dental problem, ear pain, hearing loss, mouth sores, nosebleeds, sinus pressure, sore throat, tinnitus, trouble swallowing and voice change.   Eyes: Negative for photophobia, pain, redness and visual disturbance.  Respiratory: Negative for cough, chest tightness and shortness of breath.   Cardiovascular: Negative for chest pain, palpitations and leg swelling.  Gastrointestinal: Negative for nausea, vomiting, abdominal pain, diarrhea, constipation, blood in stool, abdominal distention and rectal pain.  Genitourinary: Negative for dysuria, urgency, frequency,  hematuria, flank pain, vaginal bleeding, vaginal discharge, difficulty urinating, genital sores, vaginal pain, menstrual problem and pelvic pain.  Musculoskeletal: Negative for back pain, arthralgias and neck stiffness.  Skin: Negative for rash.  Neurological: Negative for dizziness, syncope, speech difficulty, weakness, light-headedness, numbness and headaches (patient has experienced some headache since starting Plaquenil therapy. These have resolved since brief discontinuation).  Hematological: Negative for adenopathy. Does not bruise/bleed easily.  Psychiatric/Behavioral: Negative for suicidal ideas, behavioral problems, self-injury, dysphoric mood and agitation. The patient is not nervous/anxious.        Objective:   Physical Exam  Constitutional: She is oriented to person, place, and time. She appears well-developed and well-nourished. No distress.  Blood pressure 122 over 78  HENT:  Head: Normocephalic and atraumatic.  Right Ear: External ear normal.  Left Ear: External ear normal.  Mouth/Throat: Oropharynx is clear and moist.  Eyes: Conjunctivae and EOM are normal.  Neck: Normal range of motion. Neck supple. No JVD present. No thyromegaly present.  Cardiovascular: Normal rate, regular rhythm and intact distal pulses.   Murmur heard. Grade 2/6 systolic murmur Pedal pulses are intact  Pulmonary/Chest: Effort normal and breath sounds normal. She has no wheezes. She  has no rales.  Abdominal: Soft. Bowel sounds are normal. She exhibits no distension and no mass. There is no tenderness. There is no rebound and no guarding.  Musculoskeletal: Normal range of motion. She exhibits no edema or tenderness.  Neurological: She is alert and oriented to person, place, and time. She has normal reflexes. No cranial nerve deficit. She exhibits normal muscle tone. Coordination normal.  Skin: Skin is warm and dry. No rash noted.  Psychiatric: She has a normal mood and affect. Her behavior is normal.           Assessment & Plan:   Preventive health examination Hypertension stable Dyslipidemia. We'll check lipid profile Hypothyroidism. We'll check TSH Rheumatoid arthritis stable. Followup rheumatology  Continue exercise regimen.  Calcium and vitamin D supplementation.  Recheck 1 year or as needed.  Continue home blood pressure monitoring.  Continue restricted salt diet

## 2016-02-27 ENCOUNTER — Encounter: Payer: Self-pay | Admitting: Internal Medicine

## 2016-03-06 ENCOUNTER — Other Ambulatory Visit: Payer: Self-pay | Admitting: Nephrology

## 2016-03-06 DIAGNOSIS — I10 Essential (primary) hypertension: Secondary | ICD-10-CM

## 2016-03-06 DIAGNOSIS — N189 Chronic kidney disease, unspecified: Secondary | ICD-10-CM

## 2016-03-08 ENCOUNTER — Other Ambulatory Visit: Payer: Self-pay | Admitting: Nephrology

## 2016-03-08 DIAGNOSIS — N189 Chronic kidney disease, unspecified: Secondary | ICD-10-CM

## 2016-03-08 DIAGNOSIS — I10 Essential (primary) hypertension: Secondary | ICD-10-CM

## 2016-03-25 ENCOUNTER — Ambulatory Visit
Admission: RE | Admit: 2016-03-25 | Discharge: 2016-03-25 | Disposition: A | Discharge status: Home / Self Care | Payer: Medicare Other | Source: Ambulatory Visit | Attending: Nephrology | Admitting: Nephrology

## 2016-03-25 DIAGNOSIS — N189 Chronic kidney disease, unspecified: Secondary | ICD-10-CM

## 2016-03-25 DIAGNOSIS — I10 Essential (primary) hypertension: Secondary | ICD-10-CM

## 2016-04-02 ENCOUNTER — Other Ambulatory Visit: Payer: Self-pay | Admitting: Internal Medicine

## 2016-04-13 ENCOUNTER — Other Ambulatory Visit: Payer: Self-pay | Admitting: Internal Medicine

## 2016-05-27 ENCOUNTER — Telehealth: Payer: Self-pay | Admitting: Rheumatology

## 2016-05-27 ENCOUNTER — Telehealth: Payer: Self-pay | Admitting: Radiology

## 2016-05-27 NOTE — Telephone Encounter (Signed)
Pt returned Amy's call

## 2016-05-27 NOTE — Telephone Encounter (Signed)
I have called patient to advise labs received are c/w previous, kidney function still a bit declined. Creat 1.23 BUN 30 GFR is 42 CBC CMP /these are available for review in [place].   Has she heard from the Nephrologist? I have left message for her to call me back

## 2016-05-28 NOTE — Telephone Encounter (Signed)
I have called patient to advise of the stable kidney function, she has just seen nephrology, and will follow up there in 72months, she is on their call list for appt.

## 2016-06-11 ENCOUNTER — Other Ambulatory Visit: Payer: Self-pay | Admitting: Rheumatology

## 2016-06-11 DIAGNOSIS — Z79899 Other long term (current) drug therapy: Secondary | ICD-10-CM | POA: Insufficient documentation

## 2016-06-11 NOTE — Telephone Encounter (Signed)
Patient states she just had labs done at [facility], told her I will call when they result, have not gotten them yet, told her to call me back if she has not heard anything in a couple days.

## 2016-06-11 NOTE — Telephone Encounter (Signed)
Last visit 01/24/16 Next visit 07/10/16 Labs CMP c/w previous 05/21/16 at nephrology office CBC 01/24/16 WNL  Eye exam WNL  11/2015 Ok to refill per Dr Estanislado Pandy   Called patient to see if she can come in for labs since CBC due.

## 2016-07-10 ENCOUNTER — Ambulatory Visit: Payer: Self-pay | Admitting: Rheumatology

## 2016-07-25 ENCOUNTER — Other Ambulatory Visit: Payer: Self-pay | Admitting: Rheumatology

## 2016-07-25 NOTE — Telephone Encounter (Signed)
Attempted to contact the patient and left message for patient to call the office.  

## 2016-07-25 NOTE — Telephone Encounter (Signed)
Last Visit: 01/24/16 Next Visit: 08/21/16 Labs: 01/25/16 Decreased kidney function  Okay to refill Robaxin?

## 2016-07-25 NOTE — Telephone Encounter (Signed)
Patient left message to be called back.

## 2016-07-25 NOTE — Telephone Encounter (Signed)
Reduce to QD-BID as pt has low GFR

## 2016-07-30 NOTE — Telephone Encounter (Signed)
Patient only uses the Robaxin every couple days, prn.

## 2016-08-21 ENCOUNTER — Ambulatory Visit: Payer: Self-pay | Admitting: Rheumatology

## 2016-08-25 DIAGNOSIS — M19041 Primary osteoarthritis, right hand: Secondary | ICD-10-CM | POA: Insufficient documentation

## 2016-08-25 DIAGNOSIS — M8589 Other specified disorders of bone density and structure, multiple sites: Secondary | ICD-10-CM | POA: Insufficient documentation

## 2016-08-25 DIAGNOSIS — M19042 Primary osteoarthritis, left hand: Secondary | ICD-10-CM

## 2016-08-25 DIAGNOSIS — M47812 Spondylosis without myelopathy or radiculopathy, cervical region: Secondary | ICD-10-CM | POA: Insufficient documentation

## 2016-08-25 NOTE — Progress Notes (Addendum)
Office Visit Note  Patient: [name] [name]             Date of Birth: [birth year]           MRN: [id number]             PCP: Marletta Lor, MD Referring: Marletta Lor, MD Visit Date: 08/26/2016 Occupation: @GUAROCC @    Subjective: stiffness   History of Present Illness: [name] [name] is a 82 y.o. female with history of rheumatoid arthritis and disc disease she states her rheumatoid arthritis is very well controlled on Plaquenil. She's not having any joint pain or joint swelling recently. She has disc disease of C-spine which is not bothersome.  Activities of Daily Living:  Patient reports morning stiffness for 5 minutes.   Patient Denies nocturnal pain.  Difficulty dressing/grooming: Denies Difficulty climbing stairs: Denies Difficulty getting out of chair: Denies Difficulty using hands for taps, buttons, cutlery, and/or writing: Denies   Review of Systems  Constitutional: Negative for fatigue, night sweats, weight gain, weight loss and weakness.  HENT: Negative for mouth sores, trouble swallowing, trouble swallowing, mouth dryness and nose dryness.   Eyes: Positive for dryness. Negative for pain, redness and visual disturbance.  Respiratory: Negative for cough, shortness of breath and difficulty breathing.   Cardiovascular: Negative for chest pain, palpitations, hypertension, irregular heartbeat and swelling in legs/feet.  Gastrointestinal: Negative for blood in stool, constipation and diarrhea.  Endocrine: Negative for increased urination.  Genitourinary: Negative for vaginal dryness.  Musculoskeletal: Positive for morning stiffness. Negative for arthralgias, joint pain, joint swelling, myalgias, muscle weakness, muscle tenderness and myalgias.  Skin: Negative for color change, rash, hair loss, skin tightness, ulcers and sensitivity to sunlight.  Allergic/Immunologic: Negative for susceptible to infections.  Neurological: Negative for dizziness, memory  loss and night sweats.  Hematological: Negative for swollen glands.  Psychiatric/Behavioral: Negative for depressed mood and sleep disturbance. The patient is not nervous/anxious.     PMFS History:  Patient Active Problem List   Diagnosis Date Noted  . History of hypothyroidism 01/20/2017  . History of congestive heart failure 08/26/2016  . Osteopenia of multiple sites 08/25/2016  . DDD cervical spine 08/25/2016  . Primary osteoarthritis of both hands 08/25/2016  . High risk medication use 06/11/2016  . CHF, acute ([place]) 02/01/2013  . 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 Hypothyroidism 01/19/2007  . Dyslipidemia 01/19/2007  . Essential hypertension 01/19/2007  . Rheumatoid arthritis ([place]) 01/19/2007    Past Medical History:  Diagnosis Date  . HYPERLIPIDEMIA 01/19/2007  . HYPERTENSION 01/19/2007  . HYPOTHYROIDISM 01/19/2007  . Rheumatoid arthritis(714.0) 01/19/2007    No family history on file. Past Surgical History:  Procedure Laterality Date  . ABDOMINAL HYSTERECTOMY    . APPENDECTOMY    . CHOLECYSTECTOMY    . NECK SURGERY     Social History   Social History Narrative  . No narrative on file     Objective: Vital Signs: BP 118/70   Pulse 72   Resp 14   Ht 5' 1.5" (1.562 m)   Wt 140 lb (63.5 kg)   BMI 26.02 kg/m    Physical Exam  Constitutional: She is oriented to person, place, and time. She appears well-developed and well-nourished.  HENT:  Head: Normocephalic and atraumatic.  Eyes: Conjunctivae and EOM are normal.  Neck: Normal range of motion.  Cardiovascular: Normal rate, regular rhythm, normal heart sounds and intact distal pulses.   Pulmonary/Chest: Effort normal and breath sounds normal.  Abdominal: Soft.  Bowel sounds are normal.  Lymphadenopathy:    She has no cervical adenopathy.  Neurological: She is alert and oriented to person, place, and time.  Skin: Skin is warm and dry. Capillary refill takes less than 2 seconds.  Psychiatric: She has a normal  mood and affect. Her behavior is normal.  Nursing note and vitals reviewed.    Musculoskeletal Exam: C-spine, thoracic, lumbar spine good range of motion with no discomfort shoulders, elbow joints, wrist joints, MCPs PIPs DIPs with good range of motion she has synovitis on palpation of her right third MCP joint and right second MCP joint. In nodule was also palpable on her right third digit. Hip joints knee joints ankles MTPs PIPs DIPs with good range of motion with no synovitis.  CDAI Exam: CDAI Homunculus Exam:   Tenderness:  Right hand: 3rd MCP Left hand: 2nd MCP  Swelling:  Right hand: 3rd MCP Left hand: 2nd MCP  Joint Counts:  CDAI Tender Joint count: 2 CDAI Swollen Joint count: 2  Global Assessments:  Patient Global Assessment: 1 Provider Global Assessment: 3  CDAI Calculated Score: 8    Investigation: Findings:  2012 PPD negative, November 2014 chest x-ray normal, 2009 hepatitis panel negative 01/24/2016 CBC normal, CMP creatinine 1.56 GFR 31 11/21/2015 GFR 55, 05/25/2014 GFR 40, 03/07/2014 GFR 45 05/26/2016 CBC normal, CMP creatinine 1.23 GFR 42    Imaging: No results found.  Speciality Comments: No specialty comments available.    Procedures:  No procedures performed Allergies: Tramadol and Codeine phosphate   Assessment / Plan:     Visit Diagnoses: Rheumatoid arthritis of multiple sites with positive rheumatoid factor ([place]) patient complains some of minimal stiffness and no joint swelling although on examination today she had synovitis in her right third MCP joint in her left second MCP joint. Her rheumatoid nodule was also noted on her right third digit. In my opinion her disease has been more active than usual. She's been on Plaquenil every other day. We discussed increasing Plaquenil to once a day for right now. And I'll like to see response to that her dose of Plaquenil was reduced due to decreasing GFR. We will check her labs again in February.  High  risk medication use - Plaquenil 200 mg po qo day, GFR in 50s - Plan: CBC with Differential/Platelet, COMPLETE METABOLIC PANEL WITH GFR  Osteopenia of multiple sites - June 2015 T score -1.1 right femoral. She reports she had some recent bone density probably done through her PCP I would advised her to look further into that. And notify us  DDD cervical spine: She's not having much discomfort in her C-spine currently  Primary osteoarthritis of both hands: Joint protection and muscle strengthening discussed.  Her other medical problems are listed as follows for which she's seeing other physicians:  Essential hypertension  History of congestive heart failure  Acquired hypothyroidism    Orders: Orders Placed This Encounter  Procedures  . CBC with Differential/Platelet  . COMPLETE METABOLIC PANEL WITH GFR   No orders of the defined types were placed in this encounter.   Face-to-face time spent with patient was 30 minutes. 50% of time was spent in counseling and coordination of care.  Follow-Up Instructions: Return in about 5 months (around 01/24/2017) for Rheumatoid arthritis.   Bo Merino, MD  Note - This record has been created using Editor, commissioning.  Chart creation errors have been sought, but may not always  have been located. Such creation errors do not reflect on  the standard of medical care. 

## 2016-08-26 ENCOUNTER — Ambulatory Visit (INDEPENDENT_AMBULATORY_CARE_PROVIDER_SITE_OTHER): Payer: Medicare Other | Admitting: Rheumatology

## 2016-08-26 ENCOUNTER — Encounter: Payer: Self-pay | Admitting: Rheumatology

## 2016-08-26 VITALS — BP 118/70 | HR 72 | Resp 14 | Ht 61.5 in | Wt 140.0 lb

## 2016-08-26 DIAGNOSIS — M8589 Other specified disorders of bone density and structure, multiple sites: Secondary | ICD-10-CM

## 2016-08-26 DIAGNOSIS — M0609 Rheumatoid arthritis without rheumatoid factor, multiple sites: Secondary | ICD-10-CM | POA: Diagnosis not present

## 2016-08-26 DIAGNOSIS — M19041 Primary osteoarthritis, right hand: Secondary | ICD-10-CM

## 2016-08-26 DIAGNOSIS — Z8679 Personal history of other diseases of the circulatory system: Secondary | ICD-10-CM | POA: Diagnosis not present

## 2016-08-26 DIAGNOSIS — M47812 Spondylosis without myelopathy or radiculopathy, cervical region: Secondary | ICD-10-CM

## 2016-08-26 DIAGNOSIS — I10 Essential (primary) hypertension: Secondary | ICD-10-CM | POA: Diagnosis not present

## 2016-08-26 DIAGNOSIS — Z79899 Other long term (current) drug therapy: Secondary | ICD-10-CM | POA: Diagnosis not present

## 2016-08-26 DIAGNOSIS — M0579 Rheumatoid arthritis with rheumatoid factor of multiple sites without organ or systems involvement: Secondary | ICD-10-CM

## 2016-08-26 DIAGNOSIS — M19042 Primary osteoarthritis, left hand: Secondary | ICD-10-CM

## 2016-08-26 DIAGNOSIS — M5030 Other cervical disc degeneration, unspecified cervical region: Secondary | ICD-10-CM | POA: Diagnosis not present

## 2016-08-26 DIAGNOSIS — E039 Hypothyroidism, unspecified: Secondary | ICD-10-CM | POA: Diagnosis not present

## 2016-08-26 NOTE — Patient Instructions (Addendum)
Standing Labs We placed an order today for your standing lab work.    Please come back and get your standing labs in February 2018 and every 4 months.    We have open lab Monday through Friday from 8:30-11:30 AM and 1:30-4 PM at the office of Dr. Tresa Moore, PA.   The office is located at [address] [place], [place], [place] [address] No appointment is necessary.   Labs are drawn by [facility].  You may receive a bill from [facility] for your lab work.

## 2016-08-26 NOTE — Progress Notes (Signed)
Rheumatology Medication Review by a Pharmacist Does the patient feel that his/her medications are working for him/her?  Yes Has the patient been experiencing any side effects to the medications prescribed?  No Does the patient have any problems obtaining medications?  No  Issues to address at subsequent visits: None   Pharmacist comments:  Mrs. Kinslow is a pleasant 82 yo F who presents for follow up of her rheumatoid arthritis.  She is currently taking hydroxychloroquine 200 mg every other day (dose reduced on 01/25/16 due to decreased kidney function).  Her most recent standing labs were on 05/26/16 at which time CMP showed BUN of 30, Cr 1.23, and GFR 42 which is stable from previous labs.  CBC was normal.  She will be due for standing labs again on February 2018.  Most recent hydroxychloroquine eye exam on file was on 11/19/15 which was normal.  Patient confirms she is scheduled to see her eye doctor again in April 2018.  Provided patient with eye exam form.  Patient denies any questions or concerns regarding her medications at this time.    Elisabeth Most, Pharm.D., BCPS, CPP Clinical Pharmacist Pager: [phone] Phone: [phone] 08/26/2016 10:46 AM

## 2016-09-10 ENCOUNTER — Other Ambulatory Visit: Payer: Self-pay | Admitting: Rheumatology

## 2016-09-10 NOTE — Telephone Encounter (Signed)
Last Visit:08/26/16 Next Visit: 01/22/17 Labs: 01/24/2016 CBC normal, CMP creatinine 1.56 GFR 31  PLQ Eye Exam: WNL 11/19/15  Okay to refill PLQ?

## 2016-09-18 ENCOUNTER — Other Ambulatory Visit: Payer: Self-pay

## 2016-09-18 DIAGNOSIS — Z79899 Other long term (current) drug therapy: Secondary | ICD-10-CM

## 2016-09-18 LAB — COMPLETE METABOLIC PANEL WITH GFR
ALT: 8 U/L (ref 6–29)
AST: 21 U/L (ref 10–35)
Albumin: 4 g/dL (ref 3.6–5.1)
Alkaline Phosphatase: 51 U/L (ref 33–130)
BUN: 33 mg/dL — ABNORMAL HIGH (ref 7–25)
CALCIUM: 9.9 mg/dL (ref 8.6–10.4)
CHLORIDE: 104 mmol/L (ref 98–110)
CO2: 26 mmol/L (ref 20–31)
Creat: 1.38 mg/dL — ABNORMAL HIGH (ref 0.60–0.93)
GFR, EST AFRICAN AMERICAN: 42 mL/min — AB (ref >=60)
GFR, EST NON AFRICAN AMERICAN: 36 mL/min — AB (ref >=60)
Glucose, Bld: 87 mg/dL (ref 65–99)
POTASSIUM: 5.5 mmol/L — AB (ref 3.5–5.3)
Sodium: 139 mmol/L (ref 135–146)
Total Bilirubin: 0.5 mg/dL (ref 0.2–1.2)
Total Protein: 7.3 g/dL (ref 6.1–8.1)

## 2016-09-18 LAB — CBC WITH DIFFERENTIAL/PLATELET
BASOS PCT: 0 %
Basophils Absolute: 0 cells/uL (ref 0–200)
EOS ABS: 118 {cells}/uL (ref 15–500)
Eosinophils Relative: 2 %
HEMATOCRIT: 37.7 % (ref 35.0–45.0)
Hemoglobin: 12.3 g/dL (ref 11.7–15.5)
LYMPHS PCT: 48 %
Lymphs Abs: 2832 cells/uL (ref 850–3900)
MCH: 26.1 pg — ABNORMAL LOW (ref 27.0–33.0)
MCHC: 32.6 g/dL (ref 32.0–36.0)
MCV: 80 fL (ref 80.0–100.0)
MONO ABS: 472 {cells}/uL (ref 200–950)
MONOS PCT: 8 %
MPV: 8.9 fL (ref 7.5–12.5)
NEUTROS PCT: 42 %
Neutro Abs: 2478 cells/uL (ref 1500–7800)
PLATELETS: 246 10*3/uL (ref 140–400)
RBC: 4.71 MIL/uL (ref 3.80–5.10)
RDW: 15 % (ref 11.0–15.0)
WBC: 5.9 10*3/uL (ref 3.8–10.8)

## 2016-09-22 LAB — HM DIABETES EYE EXAM

## 2016-09-25 ENCOUNTER — Encounter: Payer: Self-pay | Admitting: Internal Medicine

## 2016-09-25 ENCOUNTER — Other Ambulatory Visit: Payer: Self-pay | Admitting: Internal Medicine

## 2016-10-24 ENCOUNTER — Encounter: Payer: Medicare Other | Admitting: Internal Medicine

## 2016-10-28 ENCOUNTER — Other Ambulatory Visit: Payer: Self-pay | Admitting: Rheumatology

## 2016-10-28 NOTE — Telephone Encounter (Signed)
Last Visit:08/26/16 Next Visit: 01/22/17 Labs:09/18/16 C/w previous PLQ Eye Exam: 11/19/2015 WNL  Okay to refill PLQ?

## 2016-10-28 NOTE — Telephone Encounter (Signed)
ok 

## 2016-11-01 ENCOUNTER — Other Ambulatory Visit: Payer: Self-pay | Admitting: Internal Medicine

## 2016-11-10 ENCOUNTER — Other Ambulatory Visit: Payer: Self-pay | Admitting: Rheumatology

## 2016-11-10 MED ORDER — HYDROXYCHLOROQUINE SULFATE 200 MG PO TABS: 200.0000 mg | ORAL_TABLET | ORAL | 0 refills | Status: DC

## 2016-11-10 NOTE — Telephone Encounter (Signed)
Last Visit:08/26/16 Next Visit: 01/22/17 Labs:09/18/16 C/w previous PLQ Eye Exam: 09/22/16 WNL  Okay to refill PLQ?

## 2016-11-10 NOTE — Telephone Encounter (Signed)
Patient is requesting refill of PLQ to be sent to [facility] on [facility].

## 2016-11-10 NOTE — Telephone Encounter (Signed)
ok 

## 2016-11-13 ENCOUNTER — Encounter: Payer: Self-pay | Admitting: Internal Medicine

## 2016-11-13 ENCOUNTER — Ambulatory Visit (INDEPENDENT_AMBULATORY_CARE_PROVIDER_SITE_OTHER): Payer: Medicare Other | Admitting: Internal Medicine

## 2016-11-13 ENCOUNTER — Other Ambulatory Visit: Payer: Self-pay | Admitting: Internal Medicine

## 2016-11-13 VITALS — BP 138/78 | HR 85 | Temp 98.0°F | Ht 61.5 in | Wt 140.0 lb

## 2016-11-13 DIAGNOSIS — I10 Essential (primary) hypertension: Secondary | ICD-10-CM | POA: Diagnosis not present

## 2016-11-13 DIAGNOSIS — Z0000 Encounter for general adult medical examination without abnormal findings: Secondary | ICD-10-CM | POA: Diagnosis not present

## 2016-11-13 DIAGNOSIS — M19042 Primary osteoarthritis, left hand: Secondary | ICD-10-CM | POA: Diagnosis not present

## 2016-11-13 DIAGNOSIS — E039 Hypothyroidism, unspecified: Secondary | ICD-10-CM | POA: Diagnosis not present

## 2016-11-13 DIAGNOSIS — M19041 Primary osteoarthritis, right hand: Secondary | ICD-10-CM

## 2016-11-13 DIAGNOSIS — E785 Hyperlipidemia, unspecified: Secondary | ICD-10-CM | POA: Diagnosis not present

## 2016-11-13 DIAGNOSIS — M069 Rheumatoid arthritis, unspecified: Secondary | ICD-10-CM | POA: Diagnosis not present

## 2016-11-13 LAB — LIPID PANEL
CHOLESTEROL: 278 mg/dL — AB (ref 0–200)
HDL: 73.4 mg/dL (ref >39.00)
LDL CALC: 167 mg/dL — AB (ref 0–99)
NonHDL: 204.36
TRIGLYCERIDES: 185 mg/dL — AB (ref 0.0–149.0)
Total CHOL/HDL Ratio: 4
VLDL: 37 mg/dL (ref 0.0–40.0)

## 2016-11-13 LAB — TSH: TSH: 7.04 u[IU]/mL — ABNORMAL HIGH (ref 0.35–4.50)

## 2016-11-13 MED ORDER — LEVOTHYROXINE SODIUM 75 MCG PO TABS: 75.0000 ug | ORAL_TABLET | Freq: Every day | ORAL | 3 refills | Status: DC

## 2016-11-13 NOTE — Progress Notes (Signed)
Subjective:    Patient ID: [name] [name], female    DOB: [birth year], 82 y.o.   MRN: [id number]  HPI 82 year old patient who is seen today for a preventive health examination.  She has had a recent are in screen via [place] [facility].  Normal.  Mini cognitive test. She is followed by rheumatology for RA and continues to do well.  She is at a recent evaluation by ophthalmology as well as renal medicine due to Plaquenil therapy No concerns or complaints today She is scheduled for mammogram next month   Allergies:  1) Codeine Phosphate (Codeine Phosphate)   Past History:   Hyperlipidemia  Hypertension  Hypothyroidism  Rheumatoid arthritis 2002  Menopausal syndrome   Past Surgical History:   Appendectomy  Cholecystectomy laparoscopic 2002  Hysterectomy age 74  colonoscopy in May 2009 C-spine surgery May 2012 4 left-sided radiculopathy ([facility]) Retinal surgery October 2013 ([place])   Family History:   father died at age 34, MI, history of diabetes  mother died age 65, status post hip fracture  grandparents, strongly positive for diabetes  Six brothers 3 sisters  three brothers deceased, age 50 , 77, MI; one brother had prostate cancer, age 28   Past Medical History:  Diagnosis Date  . HYPERLIPIDEMIA 01/19/2007  . HYPERTENSION 01/19/2007  . HYPOTHYROIDISM 01/19/2007  . Rheumatoid arthritis(714.0) 01/19/2007     Social History   Social History  . Marital status: Married    Spouse name: N/A  . Number of children: N/A  . Years of education: N/A   Occupational History  . Not on file.   Social History Main Topics  . Smoking status: Never Smoker  . Smokeless tobacco: Never Used  . Alcohol use No  . Drug use: No  . Sexual activity: Not on file   Other Topics Concern  . Not on file   Social History Narrative  . No narrative on file    Past Surgical History:  Procedure Laterality Date  . ABDOMINAL HYSTERECTOMY    . APPENDECTOMY    . CHOLECYSTECTOMY      . NECK SURGERY      No family history on file.  Allergies  Allergen Reactions  . Tramadol Nausea Only  . Codeine Phosphate Nausea Only    REACTION: unspecified    Current Outpatient Prescriptions on File Prior to Visit  Medication Sig Dispense Refill  . benazepril-hydrochlorthiazide (LOTENSIN HCT) 10-12.5 MG tablet TAKE 1 TABLET BY MOUTH DAILY. 90 tablet 1  . hydroxychloroquine (PLAQUENIL) 200 MG tablet Take 1 tablet (200 mg total) by mouth every other day. 45 tablet 0  . levothyroxine (SYNTHROID, LEVOTHROID) 50 MCG tablet TAKE 1 TABLET BY MOUTH EVERY DAY 90 tablet 1  . Multiple Vitamins-Minerals (MULTIVITAMIN WITH MINERALS) tablet Take 1 tablet by mouth daily.    Marland Kitchen OVER THE COUNTER MEDICATION Place 1 drop into both eyes daily. "hyper tear"     No current facility-administered medications on file prior to visit.     BP 138/78 (BP Location: Left Arm, Patient Position: Sitting, Cuff Size: Normal)   Pulse 85   Temp 98 F (36.7 C) (Oral)   Ht 5' 1.5" (1.562 m)   Wt 140 lb (63.5 kg)   SpO2 96%   BMI 26.02 kg/m   Medicare wellness visit  1. Risk factors, based on past  M,S,F history.  Current vascular risk factors include hypertension and mild dyslipidemia.  She has been on statin therapy in the past  2.  Physical activities:remains  quite active.  Walks 2 miles several times per week  3.  Depression/mood:no history of major depression or mood disorder 4.  Hearing:no significant deficits  5.  ADL's:independent  6.  Fall risk:low to moderate due to age  34.  Home safety:no problems identified  8.  Height weight, and visual acuity;height and weight stable no change in visual acuity is followed closely by ophthalmology due to Plaquenil therapy  9.  Counseling:continue active lifestyle and heart healthy diet  10. Lab orders based on risk factors:recent CBC and chemistries reviewed.  Will check TSH and lipid profile  11. Referral :follow-up rheumatology  12. Care  plan:continue efforts at aggressive risk factor modification  13. Cognitive assessment: alert and normal with normal affect no cognitive dysfunction  14. Screening: Patient provided with a written and personalized 5-10 year screening schedule in the AVS.    15. Provider List Update: primary care.  Rheumatology ophthalmology    Review of Systems  Constitutional: Negative.   HENT: Negative for congestion, dental problem, hearing loss, rhinorrhea, sinus pressure, sore throat and tinnitus.   Eyes: Negative for pain, discharge and visual disturbance.  Respiratory: Negative for cough and shortness of breath.   Cardiovascular: Negative for chest pain, palpitations and leg swelling.  Gastrointestinal: Negative for abdominal distention, abdominal pain, blood in stool, constipation, diarrhea, nausea and vomiting.  Genitourinary: Negative for difficulty urinating, dysuria, flank pain, frequency, hematuria, pelvic pain, urgency, vaginal bleeding, vaginal discharge and vaginal pain.  Musculoskeletal: Negative for arthralgias, gait problem and joint swelling.  Skin: Negative for rash.  Neurological: Negative for dizziness, syncope, speech difficulty, weakness, numbness and headaches.  Hematological: Negative for adenopathy.  Psychiatric/Behavioral: Negative for agitation, behavioral problems and dysphoric mood. The patient is not nervous/anxious.        Objective:   Physical Exam  Constitutional: She is oriented to person, place, and time. She appears well-developed and well-nourished.  Blood pressure 126/76  HENT:  Head: Normocephalic and atraumatic.  Right Ear: External ear normal.  Left Ear: External ear normal.  Mouth/Throat: Oropharynx is clear and moist.  Eyes: Conjunctivae and EOM are normal.  Neck: Normal range of motion. Neck supple. No JVD present. No thyromegaly present.  Cardiovascular: Normal rate, regular rhythm, normal heart sounds and intact distal pulses.   No murmur  heard. A few ectopics Grade 2/6 systolic murmur loudest at the primary aortic area  Pulmonary/Chest: Effort normal and breath sounds normal. She has no wheezes. She has no rales.  Abdominal: Soft. Bowel sounds are normal. She exhibits no distension and no mass. There is no tenderness. There is no rebound and no guarding.  Musculoskeletal: Normal range of motion. She exhibits no edema or tenderness.  Hypertrophic changes right third PIP joint  Neurological: She is alert and oriented to person, place, and time. She has normal reflexes. No cranial nerve deficit. She exhibits normal muscle tone. Coordination normal.  Skin: Skin is warm and dry. No rash noted.  Psychiatric: She has a normal mood and affect. Her behavior is normal.          Assessment & Plan:   Preventive health examination Medicare wellness visit Essential hypertension, stable.  Will continue home blood pressure monitoring.  Low-salt diet recommended History mild dyslipidemia.  We'll review a lipid profile RA.  Follow-up rheumatology Hypothyroidism.  We'll check a TSH  Follow-up one year or as needed  [employer]

## 2016-11-13 NOTE — Progress Notes (Signed)
Pre visit review using our clinic review tool, if applicable. No additional management support is needed unless otherwise documented below in the visit note. 

## 2016-11-13 NOTE — Patient Instructions (Signed)
Limit your sodium (Salt) intake  Please check your blood pressure on a regular basis.  If it is consistently greater than 150/90, please make an office appointment.    It is important that you exercise regularly, at least 20 minutes 3 to 4 times per week.  If you develop chest pain or shortness of breath seek  medical attention.  Take a calcium supplement, plus 800-1200 units of vitamin D  Return in one year for follow-up  

## 2016-11-19 ENCOUNTER — Other Ambulatory Visit: Payer: Self-pay | Admitting: Rheumatology

## 2016-11-19 NOTE — Telephone Encounter (Signed)
Patient called requesting a refill on her PLQ. [facility]  CB#[phone].  Thank you.

## 2016-11-19 NOTE — Telephone Encounter (Signed)
Left message to advise [facility] [address] on [facility]10/16. Advised her to contact the pharmacy as the prescription should be on file and to contact the office if there are concerns.

## 2016-12-09 ENCOUNTER — Telehealth: Payer: Self-pay | Admitting: Internal Medicine

## 2016-12-09 MED ORDER — BENAZEPRIL-HYDROCHLOROTHIAZIDE 10-12.5 MG PO TABS: 1.0000 | ORAL_TABLET | Freq: Every day | ORAL | 0 refills | Status: DC

## 2016-12-09 NOTE — Telephone Encounter (Signed)
Pt is out of town and only has 4 pills left. Pt needs new rx benazepril-hctz 10-12.5 mg #90  [facility] [place] [phone]

## 2016-12-09 NOTE — Telephone Encounter (Signed)
Medication was refilled as requested by pt.  

## 2017-01-20 DIAGNOSIS — Z8639 Personal history of other endocrine, nutritional and metabolic disease: Secondary | ICD-10-CM | POA: Insufficient documentation

## 2017-01-20 NOTE — Progress Notes (Signed)
Office Visit Note  Patient: [name] [name]             Date of Birth: 21-Nov-1934           MRN: [id number]             PCP: Marletta Lor, MD Referring: Marletta Lor, MD Visit Date: 01/22/2017 Occupation: '@GUAROCC' @    Subjective:  Medication management.   History of Present Illness: [name] [name] is a 82 y.o. female  with history of sero positive rheumatoid arthritis. She states she's been doing quite well. She has intermittent pain and swelling in her hands. She states it started after reducing her Plaquenil dose to every other day.Marland Kitchen Her neck is doing well .  Activities of Daily Living:  Patient reports morning stiffness for 5 minutes.   Patient Denies nocturnal pain.  Difficulty dressing/grooming: Denies Difficulty climbing stairs: Denies Difficulty getting out of chair: Denies Difficulty using hands for taps, buttons, cutlery, and/or writing: Denies   Review of Systems  Constitutional: Negative for fatigue, night sweats, weight gain, weight loss and weakness.  HENT: Negative for mouth sores, trouble swallowing, trouble swallowing, mouth dryness and nose dryness.   Eyes: Negative for pain, redness, visual disturbance and dryness.  Respiratory: Negative for cough, shortness of breath and difficulty breathing.   Cardiovascular: Negative for chest pain, palpitations, hypertension, irregular heartbeat and swelling in legs/feet.  Gastrointestinal: Negative for blood in stool, constipation and diarrhea.  Endocrine: Negative for increased urination.  Genitourinary: Negative for vaginal dryness.  Musculoskeletal: Positive for morning stiffness. Negative for arthralgias, joint pain, joint swelling, myalgias, muscle weakness, muscle tenderness and myalgias.  Skin: Negative for color change, rash, hair loss, skin tightness, ulcers and sensitivity to sunlight.  Allergic/Immunologic: Negative for susceptible to infections.  Neurological: Negative for dizziness, memory  loss and night sweats.  Hematological: Negative for swollen glands.  Psychiatric/Behavioral: Negative for depressed mood and sleep disturbance. The patient is not nervous/anxious.     PMFS History:  Patient Active Problem List   Diagnosis Date Noted  . Chronic kidney disease (CKD) stage G3b 01/21/2017  . History of hypothyroidism 01/20/2017  . History of congestive heart failure 08/26/2016  . Osteopenia of multiple sites 08/25/2016  . DDD cervical spine 08/25/2016  . Primary osteoarthritis of both hands 08/25/2016  . High risk medication use 06/11/2016  . CHF, acute ([place]) 02/01/2013  . 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 Hypothyroidism 01/19/2007  . Dyslipidemia 01/19/2007  . Essential hypertension 01/19/2007  . Rheumatoid arthritis ([place]) 01/19/2007    Past Medical History:  Diagnosis Date  . HYPERLIPIDEMIA 01/19/2007  . HYPERTENSION 01/19/2007  . HYPOTHYROIDISM 01/19/2007  . Rheumatoid arthritis(714.0) 01/19/2007    No family history on file. Past Surgical History:  Procedure Laterality Date  . ABDOMINAL HYSTERECTOMY    . APPENDECTOMY    . CHOLECYSTECTOMY    . NECK SURGERY     Social History   Social History Narrative  . No narrative on file     Objective: Vital Signs: BP 117/60   Pulse 89   Resp 12   Ht '5\' 1"'  (1.549 m)   Wt 132 lb (59.9 kg)   BMI 24.94 kg/m    Physical Exam  Constitutional: She is oriented to person, place, and time. She appears well-developed and well-nourished.  HENT:  Head: Normocephalic and atraumatic.  Eyes: Conjunctivae and EOM are normal.  Neck: Normal range of motion.  Cardiovascular: Normal rate, regular rhythm, normal heart sounds and intact distal  pulses.   Pulmonary/Chest: Effort normal and breath sounds normal.  Abdominal: Soft. Bowel sounds are normal.  Lymphadenopathy:    She has no cervical adenopathy.  Neurological: She is alert and oriented to person, place, and time.  Skin: Skin is warm and dry. Capillary refill takes  less than 2 seconds.  Psychiatric: She has a normal mood and affect. Her behavior is normal.  Nursing note and vitals reviewed.    Musculoskeletal Exam: C-spine and thoracic lumbar spine good range of motion. Shoulder joints elbow joints wrist joints are good range of motion. She has some synovitis over MCPs as described below. Hip joints knee joints ankles MTPs PIPs with good range of motion with no synovitis.  CDAI Exam: CDAI Homunculus Exam:   Tenderness:  Right hand: 3rd MCP Left hand: 2nd MCP  Swelling:  Right hand: 3rd MCP Left hand: 2nd MCP  Joint Counts:  CDAI Tender Joint count: 2 CDAI Swollen Joint count: 2  Global Assessments:  Patient Global Assessment: 2 Provider Global Assessment: 4  CDAI Calculated Score: 10    Investigation: Findings:  02/06/2016 DEXA T score -1.3 Right femoral neck   09/22/2016 normal PLQ eye exam  CBC Latest Ref Rng & Units 09/18/2016 10/22/2015 10/19/2014  WBC 3.8 - 10.8 K/uL 5.9 7.0 5.9  Hemoglobin 11.7 - 15.5 g/dL 12.3 12.0 12.8  Hematocrit 35.0 - 45.0 % 37.7 37.4 38.7  Platelets 140 - 400 K/uL 246 310.0 275.0    CMP Latest Ref Rng & Units 09/18/2016 10/22/2015 10/19/2014  Glucose 65 - 99 mg/dL 87 98 97  BUN 7 - 25 mg/dL 33(H) 19 26(H)  Creatinine 0.60 - 0.93 mg/dL 1.38(H) 1.04 1.23(H)  Sodium 135 - 146 mmol/L 139 137 139  Potassium 3.5 - 5.3 mmol/L 5.5(H) 4.4 4.4  Chloride 98 - 110 mmol/L 104 99 104  CO2 20 - 31 mmol/L '26 28 30  ' Calcium 8.6 - 10.4 mg/dL 9.9 9.4 9.7  Total Protein 6.1 - 8.1 g/dL 7.3 7.0 7.7  Total Bilirubin 0.2 - 1.2 mg/dL 0.5 0.5 0.6  Alkaline Phos 33 - 130 U/L 51 67 62  AST 10 - 35 U/L '21 17 20  ' ALT 6 - 29 U/L '8 9 10     ' Imaging: No results found.  Speciality Comments: No specialty comments available.    Procedures:  No procedures performed Allergies: Tramadol and Codeine phosphate   Assessment / Plan:     Visit Diagnoses: Rheumatoid arthritis involving multiple sites with positive rheumatoid  factor (HCC) - +RF, h/o high ESR, nodulosis. She's having mild flare with increased pain and swelling in her MCP joints. She believes the swelling got worse after reducing the Plaquenil dose. We will increase her Plaquenil to one tablet by mouth daily. I'll call in a new prescription for her.  High risk medication use - PLQ 200 mg by mouth every other day -her labs are past-due I will check her labs today and then in 3 months. Plan: CBC with Differential/Platelet, COMPLETE METABOLIC PANEL WITH GFR  Primary osteoarthritis of both hands: Joint protection and muscle strengthening discussed.  DDD cervical spine history of cervical fusion : Doing well so I'll check another order [place] 3 months okay  History of congestive heart failure - dxd 2014  Chronic kidney disease (CKD) stage G3b - GFR 30s  History of hypothyroidism  Osteopenia of multiple sites - Plan: VITAMIN D 25 Hydroxy (Vit-D Deficiency, Fractures)    Orders: Orders Placed This Encounter  Procedures  . CBC  with Differential/Platelet  . COMPLETE METABOLIC PANEL WITH GFR  . VITAMIN D 25 Hydroxy (Vit-D Deficiency, Fractures)   Meds ordered this encounter  Medications  . hydroxychloroquine (PLAQUENIL) 200 MG tablet    Sig: Take 1 tablet (200 mg total) by mouth daily.    Dispense:  90 tablet    Refill:  1    Face-to-face time spent with patient was 30 minutes. 50% of time was spent in counseling and coordination of care.  Follow-Up Instructions: Return in about 5 months (around 06/24/2017) for Rheumatoid arthritis.   Bo Merino, MD  Note - This record has been created using Editor, commissioning.  Chart creation errors have been sought, but may not always  have been located. Such creation errors do not reflect on  the standard of medical care.

## 2017-01-21 DIAGNOSIS — N183 Chronic kidney disease, stage 3 (moderate): Secondary | ICD-10-CM

## 2017-01-21 DIAGNOSIS — N1832 Chronic kidney disease, stage 3b: Secondary | ICD-10-CM | POA: Insufficient documentation

## 2017-01-22 ENCOUNTER — Ambulatory Visit (INDEPENDENT_AMBULATORY_CARE_PROVIDER_SITE_OTHER): Payer: Medicare Other | Admitting: Rheumatology

## 2017-01-22 ENCOUNTER — Encounter: Payer: Self-pay | Admitting: Rheumatology

## 2017-01-22 VITALS — BP 117/60 | HR 89 | Resp 12 | Ht 61.0 in | Wt 132.0 lb

## 2017-01-22 DIAGNOSIS — M0579 Rheumatoid arthritis with rheumatoid factor of multiple sites without organ or systems involvement: Secondary | ICD-10-CM

## 2017-01-22 DIAGNOSIS — M19042 Primary osteoarthritis, left hand: Secondary | ICD-10-CM

## 2017-01-22 DIAGNOSIS — M5030 Other cervical disc degeneration, unspecified cervical region: Secondary | ICD-10-CM | POA: Diagnosis not present

## 2017-01-22 DIAGNOSIS — M19041 Primary osteoarthritis, right hand: Secondary | ICD-10-CM

## 2017-01-22 DIAGNOSIS — Z79899 Other long term (current) drug therapy: Secondary | ICD-10-CM

## 2017-01-22 DIAGNOSIS — Z8679 Personal history of other diseases of the circulatory system: Secondary | ICD-10-CM

## 2017-01-22 DIAGNOSIS — N1832 Chronic kidney disease, stage 3b: Secondary | ICD-10-CM

## 2017-01-22 DIAGNOSIS — M8589 Other specified disorders of bone density and structure, multiple sites: Secondary | ICD-10-CM

## 2017-01-22 DIAGNOSIS — N183 Chronic kidney disease, stage 3 (moderate): Secondary | ICD-10-CM

## 2017-01-22 DIAGNOSIS — Z8639 Personal history of other endocrine, nutritional and metabolic disease: Secondary | ICD-10-CM

## 2017-01-22 DIAGNOSIS — M47812 Spondylosis without myelopathy or radiculopathy, cervical region: Secondary | ICD-10-CM

## 2017-01-22 LAB — CBC WITH DIFFERENTIAL/PLATELET
BASOS PCT: 0 %
Basophils Absolute: 0 cells/uL (ref 0–200)
EOS ABS: 150 {cells}/uL (ref 15–500)
Eosinophils Relative: 2 %
HEMATOCRIT: 39.1 % (ref 35.0–45.0)
Hemoglobin: 12.7 g/dL (ref 11.7–15.5)
LYMPHS ABS: 2700 {cells}/uL (ref 850–3900)
LYMPHS PCT: 36 %
MCH: 26.5 pg — ABNORMAL LOW (ref 27.0–33.0)
MCHC: 32.5 g/dL (ref 32.0–36.0)
MCV: 81.6 fL (ref 80.0–100.0)
MONO ABS: 750 {cells}/uL (ref 200–950)
MPV: 9.1 fL (ref 7.5–12.5)
Monocytes Relative: 10 %
NEUTROS ABS: 3900 {cells}/uL (ref 1500–7800)
Neutrophils Relative %: 52 %
Platelets: 250 10*3/uL (ref 140–400)
RBC: 4.79 MIL/uL (ref 3.80–5.10)
RDW: 14.9 % (ref 11.0–15.0)
WBC: 7.5 10*3/uL (ref 3.8–10.8)

## 2017-01-22 MED ORDER — HYDROXYCHLOROQUINE SULFATE 200 MG PO TABS: 200.0000 mg | ORAL_TABLET | Freq: Every day | ORAL | 1 refills | Status: DC

## 2017-01-22 NOTE — Patient Instructions (Signed)
Standing Labs We placed an order today for your standing lab work.    Please come back and get your standing labs in September and every 3 months  We have open lab Monday through Friday from 8:30-11:30 AM and 1:30-4 PM at the office of Dr. Tresa Moore, PA.   The office is located at [address] [place], [place], [place] [address] No appointment is necessary.   Labs are drawn by [facility].  You may receive a bill from [facility] for your lab work. If you have any questions regarding directions or hours of operation,  please call [phone].

## 2017-01-23 LAB — COMPLETE METABOLIC PANEL WITH GFR
ALT: 11 U/L (ref 6–29)
AST: 20 U/L (ref 10–35)
Albumin: 3.8 g/dL (ref 3.6–5.1)
Alkaline Phosphatase: 49 U/L (ref 33–130)
BUN: 22 mg/dL (ref 7–25)
CALCIUM: 9.9 mg/dL (ref 8.6–10.4)
CHLORIDE: 100 mmol/L (ref 98–110)
CO2: 25 mmol/L (ref 20–31)
Creat: 1.2 mg/dL — ABNORMAL HIGH (ref 0.60–0.88)
GFR, Est African American: 49 mL/min — ABNORMAL LOW (ref >=60)
GFR, Est Non African American: 43 mL/min — ABNORMAL LOW (ref >=60)
Glucose, Bld: 87 mg/dL (ref 65–99)
Potassium: 5.2 mmol/L (ref 3.5–5.3)
Sodium: 138 mmol/L (ref 135–146)
Total Bilirubin: 0.4 mg/dL (ref 0.2–1.2)
Total Protein: 7.1 g/dL (ref 6.1–8.1)

## 2017-01-23 LAB — VITAMIN D 25 HYDROXY (VIT D DEFICIENCY, FRACTURES): Vit D, 25-Hydroxy: 64 ng/mL (ref 30–100)

## 2017-01-23 NOTE — Progress Notes (Signed)
stable °

## 2017-03-20 ENCOUNTER — Telehealth: Payer: Self-pay | Admitting: Internal Medicine

## 2017-03-20 MED ORDER — BENAZEPRIL-HYDROCHLOROTHIAZIDE 10-12.5 MG PO TABS: 1.0000 | ORAL_TABLET | Freq: Every day | ORAL | Status: DC

## 2017-03-20 NOTE — Telephone Encounter (Signed)
°  Pt picked this med up in [place] Because she was there taking care of her sister. She is now back and [place].    Pt request refill of the following:  benazepril-hydrochlorthiazide (LOTENSIN HCT) 10-12.5 MG tablet   [facility] [address]

## 2017-03-20 NOTE — Telephone Encounter (Signed)
Mediation was refilled as requested by pt.

## 2017-03-26 ENCOUNTER — Other Ambulatory Visit: Payer: Self-pay | Admitting: Internal Medicine

## 2017-03-26 MED ORDER — BENAZEPRIL-HYDROCHLOROTHIAZIDE 10-12.5 MG PO TABS: 1.0000 | ORAL_TABLET | Freq: Every day | ORAL | Status: DC

## 2017-06-18 NOTE — Progress Notes (Deleted)
Office Visit Note  Patient: [name] [name]             Date of Birth: May 22, 1935           MRN: [id number]             PCP: Marletta Lor, MD Referring: Marletta Lor, MD Visit Date: 06/30/2017 Occupation: @GUAROCC @    Subjective:  No chief complaint on file.   History of Present Illness: [name] [name] is a 82 y.o. female ***   Activities of Daily Living:  Patient reports morning stiffness for *** {minute/hour:19697}.   Patient {ACTIONS;DENIES/REPORTS:21021675::"Denies"} nocturnal pain.  Difficulty dressing/grooming: {ACTIONS;DENIES/REPORTS:21021675::"Denies"} Difficulty climbing stairs: {ACTIONS;DENIES/REPORTS:21021675::"Denies"} Difficulty getting out of chair: {ACTIONS;DENIES/REPORTS:21021675::"Denies"} Difficulty using hands for taps, buttons, cutlery, and/or writing: {ACTIONS;DENIES/REPORTS:21021675::"Denies"}   No Rheumatology ROS completed.   PMFS History:  Patient Active Problem List   Diagnosis Date Noted  . Chronic kidney disease (CKD) stage G3b 01/21/2017  . History of hypothyroidism 01/20/2017  . History of congestive heart failure 08/26/2016  . Osteopenia of multiple sites 08/25/2016  . DDD cervical spine 08/25/2016  . Primary osteoarthritis of both hands 08/25/2016  . High risk medication use 06/11/2016  . CHF, acute ([place]) 02/01/2013  . 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 Hypothyroidism 01/19/2007  . Dyslipidemia 01/19/2007  . Essential hypertension 01/19/2007  . Rheumatoid arthritis ([place]) 01/19/2007    Past Medical History:  Diagnosis Date  . HYPERLIPIDEMIA 01/19/2007  . HYPERTENSION 01/19/2007  . HYPOTHYROIDISM 01/19/2007  . Rheumatoid arthritis(714.0) 01/19/2007    No family history on file. Past Surgical History:  Procedure Laterality Date  . ABDOMINAL HYSTERECTOMY    . APPENDECTOMY    . CHOLECYSTECTOMY    . NECK SURGERY     Social History   Social History Narrative  . Not on file     Objective: Vital Signs: There  were no vitals taken for this visit.   Physical Exam   Musculoskeletal Exam: ***  CDAI Exam: No CDAI exam completed.    Investigation: No additional findings.PLQ eye exam: 03/17/2017 CBC Latest Ref Rng & Units 01/22/2017 09/18/2016 10/22/2015  WBC 3.8 - 10.8 K/uL 7.5 5.9 7.0  Hemoglobin 11.7 - 15.5 g/dL 12.7 12.3 12.0  Hematocrit 35.0 - 45.0 % 39.1 37.7 37.4  Platelets 140 - 400 K/uL 250 246 310.0   CMP Latest Ref Rng & Units 01/22/2017 09/18/2016 10/22/2015  Glucose 65 - 99 mg/dL 87 87 98  BUN 7 - 25 mg/dL 22 33(H) 19  Creatinine 0.60 - 0.88 mg/dL 1.20(H) 1.38(H) 1.04  Sodium 135 - 146 mmol/L 138 139 137  Potassium 3.5 - 5.3 mmol/L 5.2 5.5(H) 4.4  Chloride 98 - 110 mmol/L 100 104 99  CO2 20 - 31 mmol/L 25 26 28   Calcium 8.6 - 10.4 mg/dL 9.9 9.9 9.4  Total Protein 6.1 - 8.1 g/dL 7.1 7.3 7.0  Total Bilirubin 0.2 - 1.2 mg/dL 0.4 0.5 0.5  Alkaline Phos 33 - 130 U/L 49 51 67  AST 10 - 35 U/L 20 21 17   ALT 6 - 29 U/L 11 8 9     Imaging: No results found.  Speciality Comments: No specialty comments available.    Procedures:  No procedures performed Allergies: Tramadol and Codeine phosphate   Assessment / Plan:     Visit Diagnoses: No diagnosis found.    Orders: No orders of the defined types were placed in this encounter.  No orders of the defined types were placed in this encounter.   Face-to-face  time spent with patient was *** minutes. 50% of time was spent in counseling and coordination of care.  Follow-Up Instructions: No Follow-up on file.   Earnestine Mealing, CMA  Note - This record has been created using Editor, commissioning.  Chart creation errors have been sought, but may not always  have been located. Such creation errors do not reflect on  the standard of medical care.

## 2017-06-30 ENCOUNTER — Ambulatory Visit: Payer: Medicare Other | Admitting: Rheumatology

## 2017-07-04 NOTE — Progress Notes (Deleted)
Office Visit Note  Patient: [name] [name]             Date of Birth: 03/19/1935           MRN: [id number]             PCP: Marletta Lor, MD Referring: Marletta Lor, MD Visit Date: 07/15/2017 Occupation: @GUAROCC @    Subjective:  No chief complaint on file.   History of Present Illness: [name] [name] is a 82 y.o. female ***   Activities of Daily Living:  Patient reports morning stiffness for *** {minute/hour:19697}.   Patient {ACTIONS;DENIES/REPORTS:21021675::"Denies"} nocturnal pain.  Difficulty dressing/grooming: {ACTIONS;DENIES/REPORTS:21021675::"Denies"} Difficulty climbing stairs: {ACTIONS;DENIES/REPORTS:21021675::"Denies"} Difficulty getting out of chair: {ACTIONS;DENIES/REPORTS:21021675::"Denies"} Difficulty using hands for taps, buttons, cutlery, and/or writing: {ACTIONS;DENIES/REPORTS:21021675::"Denies"}   No Rheumatology ROS completed.   PMFS History:  Patient Active Problem List   Diagnosis Date Noted  . Chronic kidney disease (CKD) stage G3b 01/21/2017  . History of hypothyroidism 01/20/2017  . History of congestive heart failure 08/26/2016  . Osteopenia of multiple sites 08/25/2016  . DDD cervical spine 08/25/2016  . Primary osteoarthritis of both hands 08/25/2016  . High risk medication use 06/11/2016  . CHF, acute ([place]) 02/01/2013  . 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 Hypothyroidism 01/19/2007  . Dyslipidemia 01/19/2007  . Essential hypertension 01/19/2007  . Rheumatoid arthritis ([place]) 01/19/2007    Past Medical History:  Diagnosis Date  . HYPERLIPIDEMIA 01/19/2007  . HYPERTENSION 01/19/2007  . HYPOTHYROIDISM 01/19/2007  . Rheumatoid arthritis(714.0) 01/19/2007    No family history on file. Past Surgical History:  Procedure Laterality Date  . ABDOMINAL HYSTERECTOMY    . APPENDECTOMY    . CHOLECYSTECTOMY    . NECK SURGERY     Social History   Social History Narrative  . Not on file     Objective: Vital Signs: There  were no vitals taken for this visit.   Physical Exam   Musculoskeletal Exam: ***  CDAI Exam: No CDAI exam completed.    Investigation: No additional findings.PLQ eye exam: 03/17/2017 CBC Latest Ref Rng & Units 01/22/2017 09/18/2016 10/22/2015  WBC 3.8 - 10.8 K/uL 7.5 5.9 7.0  Hemoglobin 11.7 - 15.5 g/dL 12.7 12.3 12.0  Hematocrit 35.0 - 45.0 % 39.1 37.7 37.4  Platelets 140 - 400 K/uL 250 246 310.0   CMP Latest Ref Rng & Units 01/22/2017 09/18/2016 10/22/2015  Glucose 65 - 99 mg/dL 87 87 98  BUN 7 - 25 mg/dL 22 33(H) 19  Creatinine 0.60 - 0.88 mg/dL 1.20(H) 1.38(H) 1.04  Sodium 135 - 146 mmol/L 138 139 137  Potassium 3.5 - 5.3 mmol/L 5.2 5.5(H) 4.4  Chloride 98 - 110 mmol/L 100 104 99  CO2 20 - 31 mmol/L 25 26 28   Calcium 8.6 - 10.4 mg/dL 9.9 9.9 9.4  Total Protein 6.1 - 8.1 g/dL 7.1 7.3 7.0  Total Bilirubin 0.2 - 1.2 mg/dL 0.4 0.5 0.5  Alkaline Phos 33 - 130 U/L 49 51 67  AST 10 - 35 U/L 20 21 17   ALT 6 - 29 U/L 11 8 9     Imaging: No results found.  Speciality Comments: No specialty comments available.    Procedures:  No procedures performed Allergies: Tramadol and Codeine phosphate   Assessment / Plan:     Visit Diagnoses: No diagnosis found.    Orders: No orders of the defined types were placed in this encounter.  No orders of the defined types were placed in this encounter.   Face-to-face  time spent with patient was *** minutes. 50% of time was spent in counseling and coordination of care.  Follow-Up Instructions: No Follow-up on file.   Earnestine Mealing, CMA  Note - This record has been created using Editor, commissioning.  Chart creation errors have been sought, but may not always  have been located. Such creation errors do not reflect on  the standard of medical care.

## 2017-07-15 ENCOUNTER — Ambulatory Visit: Payer: Medicare Other | Admitting: Rheumatology

## 2017-08-12 ENCOUNTER — Other Ambulatory Visit: Payer: Self-pay | Admitting: Rheumatology

## 2017-08-12 NOTE — Telephone Encounter (Signed)
ok 

## 2017-08-12 NOTE — Telephone Encounter (Signed)
Last visit: 01/22/17 Next visit: 11/22/17 Labs: 01/22/17 stable PLQ eye exam WNL 09/22/16  Left message to advise patient she is due for labs and the lab hours.  Okay to refill 30 day supply PLQ?

## 2017-09-24 DIAGNOSIS — Z79899 Other long term (current) drug therapy: Secondary | ICD-10-CM | POA: Diagnosis not present

## 2017-09-24 DIAGNOSIS — M069 Rheumatoid arthritis, unspecified: Secondary | ICD-10-CM | POA: Diagnosis not present

## 2017-09-24 DIAGNOSIS — Z961 Presence of intraocular lens: Secondary | ICD-10-CM | POA: Diagnosis not present

## 2017-09-24 DIAGNOSIS — H04123 Dry eye syndrome of bilateral lacrimal glands: Secondary | ICD-10-CM | POA: Diagnosis not present

## 2017-09-26 ENCOUNTER — Other Ambulatory Visit: Payer: Self-pay | Admitting: Rheumatology

## 2017-09-28 NOTE — Telephone Encounter (Addendum)
Patient needs updated labs.

## 2017-10-02 ENCOUNTER — Other Ambulatory Visit: Payer: Self-pay | Admitting: Rheumatology

## 2017-10-08 ENCOUNTER — Other Ambulatory Visit: Payer: Self-pay | Admitting: *Deleted

## 2017-10-08 ENCOUNTER — Other Ambulatory Visit: Payer: Self-pay

## 2017-10-08 DIAGNOSIS — Z79899 Other long term (current) drug therapy: Secondary | ICD-10-CM | POA: Diagnosis not present

## 2017-10-08 LAB — COMPLETE METABOLIC PANEL WITH GFR
AG Ratio: 1.2 (calc) (ref 1.0–2.5)
ALT: 10 U/L (ref 6–29)
AST: 21 U/L (ref 10–35)
Albumin: 4.2 g/dL (ref 3.6–5.1)
Alkaline phosphatase (APISO): 64 U/L (ref 33–130)
BUN/Creatinine Ratio: 15 (calc) (ref 6–22)
BUN: 20 mg/dL (ref 7–25)
CALCIUM: 10.2 mg/dL (ref 8.6–10.4)
CO2: 29 mmol/L (ref 20–32)
CREATININE: 1.32 mg/dL — AB (ref 0.60–0.88)
Chloride: 102 mmol/L (ref 98–110)
GFR, EST NON AFRICAN AMERICAN: 38 mL/min/{1.73_m2} — AB (ref >=60)
GFR, Est African American: 44 mL/min/{1.73_m2} — ABNORMAL LOW (ref >=60)
GLOBULIN: 3.5 g/dL (ref 1.9–3.7)
Glucose, Bld: 98 mg/dL (ref 65–99)
Potassium: 4.8 mmol/L (ref 3.5–5.3)
SODIUM: 140 mmol/L (ref 135–146)
Total Bilirubin: 0.4 mg/dL (ref 0.2–1.2)
Total Protein: 7.7 g/dL (ref 6.1–8.1)

## 2017-10-08 LAB — CBC WITH DIFFERENTIAL/PLATELET
Basophils Absolute: 40 cells/uL (ref 0–200)
Basophils Relative: 0.6 %
EOS PCT: 1.6 %
Eosinophils Absolute: 107 cells/uL (ref 15–500)
HEMATOCRIT: 38.5 % (ref 35.0–45.0)
Hemoglobin: 12.7 g/dL (ref 11.7–15.5)
LYMPHS ABS: 2030 {cells}/uL (ref 850–3900)
MCH: 26 pg — ABNORMAL LOW (ref 27.0–33.0)
MCHC: 33 g/dL (ref 32.0–36.0)
MCV: 78.9 fL — ABNORMAL LOW (ref 80.0–100.0)
MPV: 9.4 fL (ref 7.5–12.5)
Monocytes Relative: 8.4 %
NEUTROS ABS: 3960 {cells}/uL (ref 1500–7800)
NEUTROS PCT: 59.1 %
Platelets: 335 10*3/uL (ref 140–400)
RBC: 4.88 10*6/uL (ref 3.80–5.10)
RDW: 12.8 % (ref 11.0–15.0)
Total Lymphocyte: 30.3 %
WBC mixed population: 563 cells/uL (ref 200–950)
WBC: 6.7 10*3/uL (ref 3.8–10.8)

## 2017-10-08 MED ORDER — HYDROXYCHLOROQUINE SULFATE 200 MG PO TABS: 200.0000 mg | ORAL_TABLET | Freq: Every day | ORAL | 2 refills | Status: DC

## 2017-10-08 NOTE — Telephone Encounter (Signed)
Patient in office to update labs and request refill on PLQ  Last visit: 01/22/17 Next visit: 11/22/17 Labs: 01/22/17 stable PLQ eye exam WNL 09/22/16  Labs updated 10/08/17  Okay to refill per Dr. Estanislado Pandy

## 2017-10-08 NOTE — Addendum Note (Signed)
Addended by: Nena Jordan on: 10/08/2017 09:24 AM   Modules accepted: Orders

## 2017-10-09 NOTE — Progress Notes (Signed)
stable °

## 2017-10-19 ENCOUNTER — Ambulatory Visit (INDEPENDENT_AMBULATORY_CARE_PROVIDER_SITE_OTHER): Payer: MEDICARE | Admitting: Physician Assistant

## 2017-10-19 ENCOUNTER — Ambulatory Visit (INDEPENDENT_AMBULATORY_CARE_PROVIDER_SITE_OTHER): Payer: MEDICARE

## 2017-10-19 ENCOUNTER — Encounter: Payer: Self-pay | Admitting: Physician Assistant

## 2017-10-19 VITALS — BP 130/77 | HR 90 | Resp 15 | Ht 61.0 in | Wt 141.0 lb

## 2017-10-19 DIAGNOSIS — G8929 Other chronic pain: Secondary | ICD-10-CM

## 2017-10-19 DIAGNOSIS — M5030 Other cervical disc degeneration, unspecified cervical region: Secondary | ICD-10-CM

## 2017-10-19 DIAGNOSIS — M25561 Pain in right knee: Secondary | ICD-10-CM | POA: Diagnosis not present

## 2017-10-19 DIAGNOSIS — N183 Chronic kidney disease, stage 3 (moderate): Secondary | ICD-10-CM

## 2017-10-19 DIAGNOSIS — Z79899 Other long term (current) drug therapy: Secondary | ICD-10-CM

## 2017-10-19 DIAGNOSIS — Z8639 Personal history of other endocrine, nutritional and metabolic disease: Secondary | ICD-10-CM | POA: Diagnosis not present

## 2017-10-19 DIAGNOSIS — M8589 Other specified disorders of bone density and structure, multiple sites: Secondary | ICD-10-CM | POA: Diagnosis not present

## 2017-10-19 DIAGNOSIS — E785 Hyperlipidemia, unspecified: Secondary | ICD-10-CM

## 2017-10-19 DIAGNOSIS — M0579 Rheumatoid arthritis with rheumatoid factor of multiple sites without organ or systems involvement: Secondary | ICD-10-CM | POA: Diagnosis not present

## 2017-10-19 DIAGNOSIS — M19041 Primary osteoarthritis, right hand: Secondary | ICD-10-CM | POA: Diagnosis not present

## 2017-10-19 DIAGNOSIS — M19042 Primary osteoarthritis, left hand: Secondary | ICD-10-CM | POA: Diagnosis not present

## 2017-10-19 DIAGNOSIS — Z8679 Personal history of other diseases of the circulatory system: Secondary | ICD-10-CM | POA: Diagnosis not present

## 2017-10-19 DIAGNOSIS — N1832 Chronic kidney disease, stage 3b: Secondary | ICD-10-CM

## 2017-10-19 DIAGNOSIS — I10 Essential (primary) hypertension: Secondary | ICD-10-CM | POA: Diagnosis not present

## 2017-10-19 NOTE — Progress Notes (Addendum)
Office Visit Note  Patient: [name] [name]             Date of Birth: [birth year]           MRN: [id number]             PCP: Marletta Lor, MD Referring: Marletta Lor, MD Visit Date: 10/19/2017 Occupation: '@GUAROCC' @    Subjective:  Right knee pain   History of Present Illness: [name] [name] is a 83 y.o. female with history of seropositive rheumatoid arthritis and osteoarthritis.  Patient continues to take Plaquenil 200 mg 1 tablet daily.  She states she recently missed 4 doses her Plaquenil due to needing a refill she has noticed some swelling in her hands as well.  Patient states she has been having increased pain in her right knee.  She denies any knee swelling.  She states that she has been walking on a daily basis but due to her knee pain she has had to hold off.  She states she has had a cyst on the back of her knee for about 30 years but over the past 3 years it has been enlarging.  She denies any tenderness, warmth, or redness.  She has been taking Tylenol over-the-counter as needed for pain relief. She would not like a cortisone injection today. She reports she has not noticed any new rheumatoid nodules.  She reports her neck is doing well.  She denies any stiffness in her neck.  She continues to have Raynaud's in her right hand.  She denies any digital ulcerations.     Activities of Daily Living:  Patient reports morning stiffness for 5-10  minutes.   Patient Denies nocturnal pain.  Difficulty dressing/grooming: Denies Difficulty climbing stairs: Denies Difficulty getting out of chair: Denies Difficulty using hands for taps, buttons, cutlery, and/or writing: Reports   Review of Systems  Constitutional: Negative for fatigue and weakness.  HENT: Negative for mouth sores, trouble swallowing, trouble swallowing, mouth dryness and nose dryness.   Eyes: Positive for dryness. Negative for pain, redness and visual disturbance.  Respiratory: Negative for cough,  hemoptysis, shortness of breath and difficulty breathing.   Cardiovascular: Negative for chest pain, palpitations, hypertension, irregular heartbeat and swelling in legs/feet.  Gastrointestinal: Negative for blood in stool, constipation and diarrhea.  Endocrine: Negative for increased urination.  Genitourinary: Negative for painful urination.  Musculoskeletal: Positive for arthralgias, joint pain, joint swelling and morning stiffness. Negative for myalgias, muscle weakness, muscle tenderness and myalgias.  Skin: Positive for color change. Negative for rash, hair loss, nodules/bumps, redness, skin tightness, ulcers and sensitivity to sunlight.  Allergic/Immunologic: Negative for susceptible to infections.  Neurological: Negative for dizziness, numbness and headaches.  Hematological: Negative for swollen glands.  Psychiatric/Behavioral: Negative for depressed mood and sleep disturbance. The patient is not nervous/anxious.     PMFS History:  Patient Active Problem List   Diagnosis Date Noted  . Chronic kidney disease (CKD) stage G3b 01/21/2017  . History of hypothyroidism 01/20/2017  . History of congestive heart failure 08/26/2016  . Osteopenia of multiple sites 08/25/2016  . DDD cervical spine 08/25/2016  . Primary osteoarthritis of both hands 08/25/2016  . High risk medication use 06/11/2016  . CHF, acute ([place]) 02/01/2013  . 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 Hypothyroidism 01/19/2007  . Dyslipidemia 01/19/2007  . Essential hypertension 01/19/2007  . Rheumatoid arthritis ([place]) 01/19/2007    Past Medical History:  Diagnosis Date  . HYPERLIPIDEMIA 01/19/2007  . HYPERTENSION 01/19/2007  .  HYPOTHYROIDISM 01/19/2007  . Rheumatoid arthritis(714.0) 01/19/2007    Family History  Problem Relation Age of Onset  . Diabetes Father   . Heart disease Father   . Cancer Sister        breast   . Heart attack Brother   . Heart attack Brother   . Heart attack Brother   . Heart disease Sister      Past Surgical History:  Procedure Laterality Date  . ABDOMINAL HYSTERECTOMY    . APPENDECTOMY    . CHOLECYSTECTOMY    . NECK SURGERY     Social History   Social History Narrative  . Not on file     Objective: Vital Signs: BP 130/77 (BP Location: Left Arm, Patient Position: Sitting, Cuff Size: Normal)   Pulse 90   Resp 15   Ht '5\' 1"'  (1.549 m)   Wt 141 lb (64 kg)   BMI 26.64 kg/m    Physical Exam  Constitutional: She is oriented to person, place, and time. She appears well-developed and well-nourished.  HENT:  Head: Normocephalic and atraumatic.  Eyes: Conjunctivae and EOM are normal.  Neck: Normal range of motion.  Cardiovascular: Normal rate, regular rhythm, normal heart sounds and intact distal pulses.  Pulmonary/Chest: Effort normal and breath sounds normal.  Abdominal: Soft. Bowel sounds are normal.  Lymphadenopathy:    She has no cervical adenopathy.  Neurological: She is alert and oriented to person, place, and time.  Skin: Skin is warm and dry. Capillary refill takes less than 2 seconds.  Psychiatric: She has a normal mood and affect. Her behavior is normal.  Nursing note and vitals reviewed.    Musculoskeletal Exam: C-spine, thoracic spine, lumbar spine good range of motion.  No midline spinal tenderness.  No SI joint tenderness.  Shoulder joints, elbow joints, wrist joints, MCPs, PIPs, DIPs good range of motion.  She has synovitis of right 3rd MCP and left 1st and 2nd MCP joints.  She has synovial thickening of 5th MCP bilaterally.  Hip joints, knee joints, ankle joints, MTPs, PIPs, DIPs good range of motion with no synovitis.  She has warmth of right knee.  No effusion noted.  She has a lipoma in the popliteal region which is not tender to palpation.  She has bilateral knee crepitus.  No tenderness of trochanteric bursa.    CDAI Exam: CDAI Homunculus Exam:   Tenderness:  Right hand: 3rd MCP Left hand: 1st MCP and 2nd MCP RLE: tibiofemoral  Swelling:   Right hand: 3rd MCP Left hand: 1st MCP and 2nd MCP  Joint Counts:  CDAI Tender Joint count: 4 CDAI Swollen Joint count: 3  Global Assessments:  Patient Global Assessment: 8   CDAI Calculated Score: 15    Investigation: No additional findings. CBC Latest Ref Rng & Units 10/08/2017 01/22/2017 09/18/2016  WBC 3.8 - 10.8 Thousand/uL 6.7 7.5 5.9  Hemoglobin 11.7 - 15.5 g/dL 12.7 12.7 12.3  Hematocrit 35.0 - 45.0 % 38.5 39.1 37.7  Platelets 140 - 400 Thousand/uL 335 250 246   CMP Latest Ref Rng & Units 10/08/2017 01/22/2017 09/18/2016  Glucose 65 - 99 mg/dL 98 87 87  BUN 7 - 25 mg/dL 20 22 33(H)  Creatinine 0.60 - 0.88 mg/dL 1.32(H) 1.20(H) 1.38(H)  Sodium 135 - 146 mmol/L 140 138 139  Potassium 3.5 - 5.3 mmol/L 4.8 5.2 5.5(H)  Chloride 98 - 110 mmol/L 102 100 104  CO2 20 - 32 mmol/L '29 25 26  ' Calcium 8.6 - 10.4 mg/dL 10.2  9.9 9.9  Total Protein 6.1 - 8.1 g/dL 7.7 7.1 7.3  Total Bilirubin 0.2 - 1.2 mg/dL 0.4 0.4 0.5  Alkaline Phos 33 - 130 U/L - 49 51  AST 10 - 35 U/L '21 20 21  ' ALT 6 - 29 U/L '10 11 8    ' Imaging: Xr Knee 3 View Right  Result Date: 10/19/2017 Moderate medial compartment joint space narrowing. Chondrocalcinosis noted. Intercondylar osteophytes present.  Severe patellofemoral joint space narrowing. Spurring of patella noted.     Speciality Comments: No specialty comments available.    Procedures:  No procedures performed Allergies: Tramadol and Codeine phosphate   Assessment / Plan:     Visit Diagnoses: Rheumatoid arthritis involving multiple sites with positive rheumatoid factor (HCC) - +RF, h/o high ESR, nodulosis: She has synovitis and tenderness of right 3rd MCP and left 1st and 2nd MCP. She has a rheumatoid nodule on her right 3rd PIP joint.  No new nodulosis.  She is on PLQ 200 mg 1 tablet daily.  She does not need any refills at this time.  We will continue to monitor her lab work every 3 months due to her elevation in creatinine and low GFR. She will  return to the office in 3 months and we will obtain x-rays of her bilateral hands. She was previously on MTX but was taken off due to elevated creatinine. We will discuss starting her on Enbrel if she continues to have active synovitis.    High risk medication use - PLQ 200 mg by mouth daily.  She does not need any refills. PLQ eye exam: 03/17/17. CBC and CMP: 10/08/17-creatinine remains elevated and GFR is low.  We will continue to monitor every 3 months.  Primary osteoarthritis of both hands: She has PIP and DIP synovial thickening consistent with osteoarthritis.  Joint protection and muscle strengthening were discussed.  Chronic pain of right knee - She has been having increased pain in her right knee. She has warmth of her right knee. No effusion on exam.  She has bilateral knee crepitus. She has what feels like a lipoma in the popliteal region.  She reports she has had this lipoma for over 30 years.  She has no tenderness, warmth, or erythema.  We discussed referring her to a surgeon for removal of the cyst.  X-rays of her right knee were obtained today and revealed chondrocalcinosis and moderate medial compartment narrowing.  She declined a cortisone injection today in the office due to the pain improving. Plan: XR KNEE 3 VIEW RIGHT   Osteopenia of multiple sites  DDD (degenerative disc disease), cervical: Good ROM on exam.  No discomfort at this time.   Other medical conditions are listed as follows:   History of hypothyroidism  History of congestive heart failure  Dyslipidemia  Essential hypertension  Chronic kidney disease (CKD) stage G3b     Orders: Orders Placed This Encounter  Procedures  . XR KNEE 3 VIEW RIGHT   No orders of the defined types were placed in this encounter.   Face-to-face time spent with patient was 30 minutes. >50% of time was spent in counseling and coordination of care.  Follow-Up Instructions: Return in about 3 months (around 01/19/2018) for  Rheumatoid arthritis, Osteoarthritis.   Ofilia Neas, PA-C   I examined and evaluated the patient with [name] PA.  Patient had a right popliteal cystic mass on palpation which was also visualized on the x-ray.  I detailed discussion with the patient regarding surgery  referral.  She states she would like to discuss that with her PCP and get surgery referral through them.  She still has some synovitis on examination.  I offered possible biologic therapy as she had elevated creatinine and she could not tolerate methotrexate in the past.  She states she would like to think at this time.  The plan of care was discussed as noted above.  Bo Merino, MD  Note - This record has been created using Editor, commissioning.  Chart creation errors have been sought, but may not always  have been located. Such creation errors do not reflect on  the standard of medical care.

## 2017-10-19 NOTE — Patient Instructions (Signed)
Standing Labs We placed an order today for your standing lab work.    Please come back and get your standing labs in June and every 3 months  We have open lab Monday through Friday from 8:30-11:30 AM and 1:30-4:00 PM  at the office of Dr. Shaili Deveshwar.   You may experience shorter wait times on Monday and Friday afternoons. The office is located at [address], [address], [place], [place] [address] No appointment is necessary.   Labs are drawn by [facility].  You may receive a bill from [facility] for your lab work. If you have any questions regarding directions or hours of operation,  please call [phone].    

## 2017-10-28 ENCOUNTER — Telehealth: Payer: Self-pay | Admitting: Rheumatology

## 2017-10-28 NOTE — Telephone Encounter (Signed)
Patient advised.

## 2017-10-28 NOTE — Telephone Encounter (Signed)
Patient called stating she has a growth on the back of her right knee.  Patient states she had an appointment with Dr. Estanislado Pandy on 3/18 who recommended a doctor.  Patient states at the time she was planning on seeing her PCP physician, but she is not able to schedule an appointment with him until the end of April so would like to know who Dr. Estanislado Pandy recommends.

## 2017-10-28 NOTE — Telephone Encounter (Signed)
Ok to wait till April.

## 2017-10-29 ENCOUNTER — Ambulatory Visit (INDEPENDENT_AMBULATORY_CARE_PROVIDER_SITE_OTHER): Payer: MEDICARE | Admitting: Rheumatology

## 2017-10-29 ENCOUNTER — Encounter: Payer: Self-pay | Admitting: Rheumatology

## 2017-10-29 VITALS — BP 135/66 | HR 86 | Resp 15 | Ht 61.0 in | Wt 141.0 lb

## 2017-10-29 DIAGNOSIS — N183 Chronic kidney disease, stage 3 (moderate): Secondary | ICD-10-CM

## 2017-10-29 DIAGNOSIS — M19042 Primary osteoarthritis, left hand: Secondary | ICD-10-CM

## 2017-10-29 DIAGNOSIS — M25561 Pain in right knee: Secondary | ICD-10-CM

## 2017-10-29 DIAGNOSIS — M0579 Rheumatoid arthritis with rheumatoid factor of multiple sites without organ or systems involvement: Secondary | ICD-10-CM

## 2017-10-29 DIAGNOSIS — G8929 Other chronic pain: Secondary | ICD-10-CM

## 2017-10-29 DIAGNOSIS — Z8639 Personal history of other endocrine, nutritional and metabolic disease: Secondary | ICD-10-CM

## 2017-10-29 DIAGNOSIS — M19041 Primary osteoarthritis, right hand: Secondary | ICD-10-CM

## 2017-10-29 DIAGNOSIS — Z79899 Other long term (current) drug therapy: Secondary | ICD-10-CM

## 2017-10-29 DIAGNOSIS — Z8679 Personal history of other diseases of the circulatory system: Secondary | ICD-10-CM

## 2017-10-29 DIAGNOSIS — E785 Hyperlipidemia, unspecified: Secondary | ICD-10-CM

## 2017-10-29 DIAGNOSIS — M8589 Other specified disorders of bone density and structure, multiple sites: Secondary | ICD-10-CM

## 2017-10-29 DIAGNOSIS — M5030 Other cervical disc degeneration, unspecified cervical region: Secondary | ICD-10-CM

## 2017-10-29 DIAGNOSIS — N1832 Chronic kidney disease, stage 3b: Secondary | ICD-10-CM

## 2017-10-29 MED ORDER — TRIAMCINOLONE ACETONIDE 40 MG/ML IJ SUSP
40.0000 mg | INTRAMUSCULAR | Status: AC | PRN
  Administered 2017-10-29: 40 mg via INTRA_ARTICULAR

## 2017-10-29 MED ORDER — LIDOCAINE HCL (PF) 1 % IJ SOLN
1.5000 mL | INTRAMUSCULAR | Status: AC | PRN
  Administered 2017-10-29: 1.5 mL

## 2017-10-29 NOTE — Patient Instructions (Signed)
Abatacept solution for injection (subcutaneous or intravenous use)  What is this medicine?  ABATACEPT (a ba TA sept) is used to treat moderate to severe active rheumatoid arthritis or psoriatic arthritis in adults. This medicine is also used to treat juvenile idiopathic arthritis.  This medicine may be used for other purposes; ask your health care provider or pharmacist if you have questions.  COMMON BRAND NAME(S): Orencia  What should I tell my health care provider before I take this medicine?  They need to know if you have any of these conditions:  -are taking other medicines to treat rheumatoid arthritis  -COPD  -diabetes  -infection or history of infections  -recently received or scheduled to receive a vaccine  -scheduled to have surgery  -tuberculosis, a positive skin test for tuberculosis or have recently been in close contact with someone who has tuberculosis  -viral hepatitis  -an unusual or allergic reaction to abatacept, other medicines, foods, dyes, or preservatives  -pregnant or trying to get pregnant  -breast-feeding  How should I use this medicine?  This medicine is for infusion into a vein or for injection under the skin. Infusions are given by a health care professional in a hospital or clinic setting. If you are to give your own medicine at home, you will be taught how to prepare and give this medicine under the skin. Use exactly as directed. Take your medicine at regular intervals. Do not take your medicine more often than directed.  It is important that you put your used needles and syringes in a special sharps container. Do not put them in a trash can. If you do not have a sharps container, call your pharmacist or healthcare provider to get one.  Talk to your pediatrician regarding the use of this medicine in children. While infusions in a clinic may be prescribed for children as young as 2 years for selected conditions, precautions do apply.  Overdosage: If you think you have taken too much of  this medicine contact a poison control center or emergency room at once.  NOTE: This medicine is only for you. Do not share this medicine with others.  What if I miss a dose?  This medicine is used once a week if given by injection under the skin. If you miss a dose, take it as soon as you can. If it is almost time for your next dose, take only that dose. Do not take double or extra doses.  If you are to be given an infusion, it is important not to miss your dose. Doses are usually every 4 weeks. Call your doctor or health care professional if you are unable to keep an appointment.  What may interact with this medicine?  Do not take this medicine with any of the following medications:  -adalimumab  -anakinra  -certolizumab  -etanercept  -golimumab  -infliximab  -live virus vaccines  -rituximab  -tocilizumab  This medicine may also interact with the following medications:  -vaccines  This list may not describe all possible interactions. Give your health care provider a list of all the medicines, herbs, non-prescription drugs, or dietary supplements you use. Also tell them if you smoke, drink alcohol, or use illegal drugs. Some items may interact with your medicine.  What should I watch for while using this medicine?  Visit your doctor for regular check ups while you are taking this medicine. Tell your doctor or healthcare professional if your symptoms do not start to get better or if they   get worse.  Call your doctor or health care professional if you get a cold or other infection while receiving this medicine. Do not treat yourself. This medicine may decrease your body's ability to fight infection. Try to avoid being around people who are sick.  What side effects may I notice from receiving this medicine?  Side effects that you should report to your doctor or health care professional as soon as possible:  -allergic reactions like skin rash, itching or hives, swelling of the face, lips, or tongue  -breathing  problems  -chest pain  -signs of infection - fever or chills, cough, unusual tiredness, pain or trouble passing urine, or warm, red or painful skin  Side effects that usually do not require medical attention (report to your doctor or health care professional if they continue or are bothersome):  -dizziness  -headache  -nausea, vomiting  -sore throat  -stomach upset  This list may not describe all possible side effects. Call your doctor for medical advice about side effects. You may report side effects to FDA at 1-800-FDA-1088.  Where should I keep my medicine?  Infusions will be given in a hospital or clinic and will not be stored at home.  Storage for syringes given under the skin and stored at home:  Keep out of the reach of children. Store in a refrigerator between 2 and 8 degrees C (36 and 46 degrees F). Keep this medicine in the original container. Protect from light. Do not freeze. Throw away any unused medicine after the expiration date.  NOTE: This sheet is a summary. It may not cover all possible information. If you have questions about this medicine, talk to your doctor, pharmacist, or health care provider.   2018 Elsevier/Gold Standard (2016-02-07 10:07:35)

## 2017-10-29 NOTE — Progress Notes (Signed)
Office Visit Note  Patient: [name] [name]             Date of Birth: 07/28/1935           MRN: [id number]             PCP: Marletta Lor, MD Referring: Marletta Lor, MD Visit Date: 10/29/2017 Occupation: @GUAROCC @    Subjective:  Right knee pain.   History of Present Illness: [name] [name] is a 83 y.o. female with history of seropositive rheumatoid arthritis, osteoarthritis and DDD.  She states she has been having increased pain and discomfort in the right knee joint for the last 3-4 days.  She is having difficulty walking.  She has been taking Plaquenil every day.  She is not having flare in any other joints.  She is not having much discomfort in her C-spine.  Activities of Daily Living:  Patient reports morning stiffness for 2 minutes.   Patient Denies nocturnal pain.  Difficulty dressing/grooming: Denies Difficulty climbing stairs: Reports Difficulty getting out of chair: Reports Difficulty using hands for taps, buttons, cutlery, and/or writing: Denies   Review of Systems  Constitutional: Negative for fatigue, night sweats, weight gain and weight loss.  HENT: Negative for mouth sores, trouble swallowing, trouble swallowing, mouth dryness and nose dryness.   Eyes: Negative for pain, redness, visual disturbance and dryness.  Respiratory: Negative for cough, shortness of breath and difficulty breathing.   Cardiovascular: Negative for chest pain, palpitations, hypertension, irregular heartbeat and swelling in legs/feet.  Gastrointestinal: Negative for blood in stool, constipation and diarrhea.  Endocrine: Negative for increased urination.  Genitourinary: Negative for vaginal dryness.  Musculoskeletal: Positive for arthralgias, joint pain and morning stiffness. Negative for joint swelling, myalgias, muscle weakness, muscle tenderness and myalgias.  Skin: Negative for color change, rash, hair loss, skin tightness, ulcers and sensitivity to sunlight.    Allergic/Immunologic: Negative for susceptible to infections.  Neurological: Negative for dizziness, memory loss, night sweats and weakness.  Hematological: Negative for swollen glands.  Psychiatric/Behavioral: Negative for depressed mood and sleep disturbance. The patient is not nervous/anxious.     PMFS History:  Patient Active Problem List   Diagnosis Date Noted  . Chronic kidney disease (CKD) stage G3b 01/21/2017  . History of hypothyroidism 01/20/2017  . History of congestive heart failure 08/26/2016  . Osteopenia of multiple sites 08/25/2016  . DDD cervical spine 08/25/2016  . Primary osteoarthritis of both hands 08/25/2016  . High risk medication use 06/11/2016  . CHF, acute ([place]) 02/01/2013  . 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 Hypothyroidism 01/19/2007  . Dyslipidemia 01/19/2007  . Essential hypertension 01/19/2007  . Rheumatoid arthritis ([place]) 01/19/2007    Past Medical History:  Diagnosis Date  . HYPERLIPIDEMIA 01/19/2007  . HYPERTENSION 01/19/2007  . HYPOTHYROIDISM 01/19/2007  . Rheumatoid arthritis(714.0) 01/19/2007    Family History  Problem Relation Age of Onset  . Diabetes Father   . Heart disease Father   . Cancer Sister        breast   . Heart attack Brother   . Heart attack Brother   . Heart attack Brother   . Heart disease Sister    Past Surgical History:  Procedure Laterality Date  . ABDOMINAL HYSTERECTOMY    . APPENDECTOMY    . CHOLECYSTECTOMY    . NECK SURGERY     Social History   Social History Narrative  . Not on file     Objective: Vital Signs: BP 135/66 (BP Location: Left  Arm, Patient Position: Sitting, Cuff Size: Normal)   Pulse 86   Resp 15   Ht 5\' 1"  (1.549 m)   Wt 141 lb (64 kg)   BMI 26.64 kg/m    Physical Exam  Constitutional: She is oriented to person, place, and time. She appears well-developed and well-nourished.  HENT:  Head: Normocephalic and atraumatic.  Eyes: Conjunctivae and EOM are normal.  Neck: Normal range  of motion.  Cardiovascular: Normal rate, regular rhythm and intact distal pulses.  Murmur heard. Pulmonary/Chest: Effort normal and breath sounds normal.  Abdominal: Soft. Bowel sounds are normal.  Lymphadenopathy:    She has no cervical adenopathy.  Neurological: She is alert and oriented to person, place, and time.  Skin: Skin is warm and dry. Capillary refill takes less than 2 seconds.  Psychiatric: She has a normal mood and affect. Her behavior is normal.  Nursing note and vitals reviewed.    Musculoskeletal Exam: C-spine thoracic lumbar spine good range of motion.  She has some discomfort range of motion of her C-spine.  Shoulder joints elbow joints wrist joints were in good range of motion.  She has synovial thickening over some of her MCPs as described below.  She also had warmth and swelling in her right knee joint.  CDAI Exam: CDAI Homunculus Exam:   Tenderness:  Right hand: 2nd MCP, 3rd MCP and 5th MCP Left hand: 1st MCP and 2nd MCP RLE: tibiofemoral  Swelling:  Right hand: 2nd MCP, 3rd MCP and 5th MCP Left hand: 2nd MCP RLE: tibiofemoral  Joint Counts:  CDAI Tender Joint count: 6 CDAI Swollen Joint count: 5  Global Assessments:  Patient Global Assessment: 5 Provider Global Assessment: 5  CDAI Calculated Score: 21    Investigation: No additional findings. PLQ eye exam: 03/17/17.  CBC Latest Ref Rng & Units 10/08/2017 01/22/2017 09/18/2016  WBC 3.8 - 10.8 Thousand/uL 6.7 7.5 5.9  Hemoglobin 11.7 - 15.5 g/dL 12.7 12.7 12.3  Hematocrit 35.0 - 45.0 % 38.5 39.1 37.7  Platelets 140 - 400 Thousand/uL 335 250 246   CMP Latest Ref Rng & Units 10/08/2017 01/22/2017 09/18/2016  Glucose 65 - 99 mg/dL 98 87 87  BUN 7 - 25 mg/dL 20 22 33(H)  Creatinine 0.60 - 0.88 mg/dL 1.32(H) 1.20(H) 1.38(H)  Sodium 135 - 146 mmol/L 140 138 139  Potassium 3.5 - 5.3 mmol/L 4.8 5.2 5.5(H)  Chloride 98 - 110 mmol/L 102 100 104  CO2 20 - 32 mmol/L 29 25 26   Calcium 8.6 - 10.4 mg/dL 10.2 9.9  9.9  Total Protein 6.1 - 8.1 g/dL 7.7 7.1 7.3  Total Bilirubin 0.2 - 1.2 mg/dL 0.4 0.4 0.5  Alkaline Phos 33 - 130 U/L - 49 51  AST 10 - 35 U/L 21 20 21   ALT 6 - 29 U/L 10 11 8     Imaging: Xr Knee 3 View Right  Result Date: 10/19/2017 Moderate medial compartment joint space narrowing. Chondrocalcinosis noted. Intercondylar osteophytes present.  Severe patellofemoral joint space narrowing. Spurring of patella noted.  Soft tissue mass was noted in the popliteal region. Impression: These findings are consistent with moderate osteoarthritis of the left knee joint and severe chondromalacia patella.  She also has a soft tissue mass in the popliteal region.   Speciality Comments: No specialty comments available.    Procedures:  Large Joint Inj: R knee on 10/29/2017 12:22 PM Indications: pain Details: 27 G 1.5 in needle, medial approach  Arthrogram: No  Medications: 1.5 mL lidocaine (PF) 1 %;  40 mg triamcinolone acetonide 40 MG/ML Aspirate: 0 mL Outcome: tolerated well, no immediate complications Procedure, treatment alternatives, risks and benefits explained, specific risks discussed. Consent was given by the patient. Immediately prior to procedure a time out was called to verify the correct patient, procedure, equipment, support staff and site/side marked as required. Patient was prepped and draped in the usual sterile fashion.     Allergies: Tramadol and Codeine phosphate   Assessment / Plan:     Visit Diagnoses: Rheumatoid arthritis involving multiple sites with positive rheumatoid factor ([place]): She has been having pain and swelling in her joints recently.  She has synovitis in her MCPs today and also swelling in the right knee joint.  In my opinion her arthritis is not well controlled with Plaquenil monotherapy anymore.  She could not take any other DMARD's due to elevated creatinine.  She also has underlying history of congestive heart failure.  I think IV Orencia or subcu Orencia  would be a good choice for her.  We had detailed discussion regarding that.  She would like to think about it and will let us know.  I plan to obtain x-ray of her bilateral hands and feet next visit. High risk medication use - PLQ 200 mg by mouth dailyPLQ eye exam: 03/17/17.Marland Kitchen  Her labs have been stable.  Primary osteoarthritis of both hands: She has mild osteoarthritic changes in her hands.  Chronic pain of right knee - xray revealed chondrocalcinosis and moderate medial compartment narrowing in the past.  She was having increased pain and discomfort in her right knee joint with swelling.  Per her request right knee joint was injected with cortisone the procedure as described above.  Osteopenia of multiple sites: She is on calcium and vitamin D. DDD (degenerative disc disease), cervical: Chronic pain  Chronic kidney disease (CKD) stage G3b  History of hypothyroidism  History of congestive heart failure  Dyslipidemia  History of hypertension    Orders: Orders Placed This Encounter  Procedures  . Large Joint Inj   No orders of the defined types were placed in this encounter.   Face-to-face time spent with patient was 30 minutes.  Greater than 50% of time was spent in counseling and coordination of care.  Follow-Up Instructions: Return in about 3 months (around 01/29/2018) for Rheumatoid arthritis, Osteoarthritis,DDD.   Bo Merino, MD  Note - This record has been created using Editor, commissioning.  Chart creation errors have been sought, but may not always  have been located. Such creation errors do not reflect on  the standard of medical care.

## 2017-11-06 ENCOUNTER — Telehealth: Payer: Self-pay | Admitting: Rheumatology

## 2017-11-06 NOTE — Telephone Encounter (Signed)
Patient called requesting prescription refill of Plaquenil.  Patient states she would like to get a 90 day supply.  Patient states when she had [facility] she was able to get a 90 day supply and now she has [facility] and only gets 30 days.   Patient's pharmacy is [facility] in [place].

## 2017-11-06 NOTE — Telephone Encounter (Signed)
Returned patient's call and advised patient there are 2 refills that were issued with the prescription that was sent in on 10/08/2017. Patient verbalized understanding and will call the pharmacy for a refill.

## 2017-11-11 ENCOUNTER — Ambulatory Visit: Payer: Medicare Other | Admitting: Physician Assistant

## 2017-11-11 ENCOUNTER — Ambulatory Visit: Payer: Medicare Other | Admitting: Rheumatology

## 2017-11-16 ENCOUNTER — Encounter: Payer: Medicare Other | Admitting: Internal Medicine

## 2017-11-25 DIAGNOSIS — N183 Chronic kidney disease, stage 3 (moderate): Secondary | ICD-10-CM | POA: Diagnosis not present

## 2017-11-25 DIAGNOSIS — I129 Hypertensive chronic kidney disease with stage 1 through stage 4 chronic kidney disease, or unspecified chronic kidney disease: Secondary | ICD-10-CM | POA: Diagnosis not present

## 2017-11-25 DIAGNOSIS — E039 Hypothyroidism, unspecified: Secondary | ICD-10-CM | POA: Diagnosis not present

## 2017-11-25 DIAGNOSIS — M069 Rheumatoid arthritis, unspecified: Secondary | ICD-10-CM | POA: Diagnosis not present

## 2017-11-26 ENCOUNTER — Encounter: Payer: Self-pay | Admitting: Internal Medicine

## 2017-11-26 ENCOUNTER — Ambulatory Visit (INDEPENDENT_AMBULATORY_CARE_PROVIDER_SITE_OTHER): Payer: Medicare HMO | Admitting: Internal Medicine

## 2017-11-26 VITALS — BP 118/70 | HR 72 | Temp 98.2°F | Ht 61.0 in | Wt 137.0 lb

## 2017-11-26 DIAGNOSIS — Z0000 Encounter for general adult medical examination without abnormal findings: Secondary | ICD-10-CM | POA: Diagnosis not present

## 2017-11-26 DIAGNOSIS — M25861 Other specified joint disorders, right knee: Secondary | ICD-10-CM | POA: Diagnosis not present

## 2017-11-26 DIAGNOSIS — N1832 Chronic kidney disease, stage 3b: Secondary | ICD-10-CM

## 2017-11-26 DIAGNOSIS — I10 Essential (primary) hypertension: Secondary | ICD-10-CM

## 2017-11-26 DIAGNOSIS — E039 Hypothyroidism, unspecified: Secondary | ICD-10-CM | POA: Diagnosis not present

## 2017-11-26 DIAGNOSIS — N183 Chronic kidney disease, stage 3 (moderate): Secondary | ICD-10-CM | POA: Diagnosis not present

## 2017-11-26 DIAGNOSIS — R2241 Localized swelling, mass and lump, right lower limb: Secondary | ICD-10-CM

## 2017-11-26 LAB — TSH: TSH: 2.58 u[IU]/mL (ref 0.35–4.50)

## 2017-11-26 NOTE — Patient Instructions (Addendum)
General surgical consultation as discussed  Rheumatology follow-up as scheduled  Return here in 1 year or as needed   Health Maintenance for Postmenopausal Women Menopause is a normal process in which your reproductive ability comes to an end. This process happens gradually over a span of months to years, usually between the ages of 64 and 16. Menopause is complete when you have missed 12 consecutive menstrual periods. It is important to talk with your health care provider about some of the most common conditions that affect postmenopausal women, such as heart disease, cancer, and bone loss (osteoporosis). Adopting a healthy lifestyle and getting preventive care can help to promote your health and wellness. Those actions can also lower your chances of developing some of these common conditions. What should I know about menopause? During menopause, you may experience a number of symptoms, such as:  Moderate-to-severe hot flashes.  Night sweats.  Decrease in sex drive.  Mood swings.  Headaches.  Tiredness.  Irritability.  Memory problems.  Insomnia.  Choosing to treat or not to treat menopausal changes is an individual decision that you make with your health care provider. What should I know about hormone replacement therapy and supplements? Hormone therapy products are effective for treating symptoms that are associated with menopause, such as hot flashes and night sweats. Hormone replacement carries certain risks, especially as you become older. If you are thinking about using estrogen or estrogen with progestin treatments, discuss the benefits and risks with your health care provider. What should I know about heart disease and stroke? Heart disease, heart attack, and stroke become more likely as you age. This may be due, in part, to the hormonal changes that your body experiences during menopause. These can affect how your body processes dietary fats, triglycerides, and  cholesterol. Heart attack and stroke are both medical emergencies. There are many things that you can do to help prevent heart disease and stroke:  Have your blood pressure checked at least every 1-2 years. High blood pressure causes heart disease and increases the risk of stroke.  If you are 51-65 years old, ask your health care provider if you should take aspirin to prevent a heart attack or a stroke.  Do not use any tobacco products, including cigarettes, chewing tobacco, or electronic cigarettes. If you need help quitting, ask your health care provider.  It is important to eat a healthy diet and maintain a healthy weight. ? Be sure to include plenty of vegetables, fruits, low-fat dairy products, and lean protein. ? Avoid eating foods that are high in solid fats, added sugars, or salt (sodium).  Get regular exercise. This is one of the most important things that you can do for your health. ? Try to exercise for at least 150 minutes each week. The type of exercise that you do should increase your heart rate and make you sweat. This is known as moderate-intensity exercise. ? Try to do strengthening exercises at least twice each week. Do these in addition to the moderate-intensity exercise.  Know your numbers.Ask your health care provider to check your cholesterol and your blood glucose. Continue to have your blood tested as directed by your health care provider.  What should I know about cancer screening? There are several types of cancer. Take the following steps to reduce your risk and to catch any cancer development as early as possible. Breast Cancer  Practice breast self-awareness. ? This means understanding how your breasts normally appear and feel. ? It also means doing  doing regular breast self-exams. Let your health care provider know about any changes, no matter how small.  If you are 40 or older, have a clinician do a breast exam (clinical breast exam or CBE) every year. Depending  on your age, family history, and medical history, it may be recommended that you also have a yearly breast X-ray (mammogram).  If you have a family history of breast cancer, talk with your health care provider about genetic screening.  If you are at high risk for breast cancer, talk with your health care provider about having an MRI and a mammogram every year.  Breast cancer (BRCA) gene test is recommended for women who have family members with BRCA-related cancers. Results of the assessment will determine the need for genetic counseling and BRCA1 and for BRCA2 testing. BRCA-related cancers include these types: ? Breast. This occurs in males or females. ? Ovarian. ? Tubal. This may also be called fallopian tube cancer. ? Cancer of the abdominal or pelvic lining (peritoneal cancer). ? Prostate. ? Pancreatic.  Cervical, Uterine, and Ovarian Cancer Your health care provider may recommend that you be screened regularly for cancer of the pelvic organs. These include your ovaries, uterus, and vagina. This screening involves a pelvic exam, which includes checking for microscopic changes to the surface of your cervix (Pap test).  For women ages 21-65, health care providers may recommend a pelvic exam and a Pap test every three years. For women ages 30-65, they may recommend the Pap test and pelvic exam, combined with testing for human papilloma virus (HPV), every five years. Some types of HPV increase your risk of cervical cancer. Testing for HPV may also be done on women of any age who have unclear Pap test results.  Other health care providers may not recommend any screening for nonpregnant women who are considered low risk for pelvic cancer and have no symptoms. Ask your health care provider if a screening pelvic exam is right for you.  If you have had past treatment for cervical cancer or a condition that could lead to cancer, you need Pap tests and screening for cancer for at least 20 years after  your treatment. If Pap tests have been discontinued for you, your risk factors (such as having a new sexual partner) need to be reassessed to determine if you should start having screenings again. Some women have medical problems that increase the chance of getting cervical cancer. In these cases, your health care provider may recommend that you have screening and Pap tests more often.  If you have a family history of uterine cancer or ovarian cancer, talk with your health care provider about genetic screening.  If you have vaginal bleeding after reaching menopause, tell your health care provider.  There are currently no reliable tests available to screen for ovarian cancer.  Lung Cancer Lung cancer screening is recommended for adults 55-80 years old who are at high risk for lung cancer because of a history of smoking. A yearly low-dose CT scan of the lungs is recommended if you:  Currently smoke.  Have a history of at least 30 pack-years of smoking and you currently smoke or have quit within the past 15 years. A pack-year is smoking an average of one pack of cigarettes per day for one year.  Yearly screening should:  Continue until it has been 15 years since you quit.  Stop if you develop a health problem that would prevent you from having lung cancer treatment.  Colorectal   Cancer  This type of cancer can be detected and can often be prevented.  Routine colorectal cancer screening usually begins at age 50 and continues through age 75.  If you have risk factors for colon cancer, your health care provider may recommend that you be screened at an earlier age.  If you have a family history of colorectal cancer, talk with your health care provider about genetic screening.  Your health care provider may also recommend using home test kits to check for hidden blood in your stool.  A small camera at the end of a tube can be used to examine your colon directly (sigmoidoscopy or colonoscopy).  This is done to check for the earliest forms of colorectal cancer.  Direct examination of the colon should be repeated every 5-10 years until age 75. However, if early forms of precancerous polyps or small growths are found or if you have a family history or genetic risk for colorectal cancer, you may need to be screened more often.  Skin Cancer  Check your skin from head to toe regularly.  Monitor any moles. Be sure to tell your health care provider: ? About any new moles or changes in moles, especially if there is a change in a mole's shape or color. ? If you have a mole that is larger than the size of a pencil eraser.  If any of your family members has a history of skin cancer, especially at a young age, talk with your health care provider about genetic screening.  Always use sunscreen. Apply sunscreen liberally and repeatedly throughout the day.  Whenever you are outside, protect yourself by wearing long sleeves, pants, a wide-brimmed hat, and sunglasses.  What should I know about osteoporosis? Osteoporosis is a condition in which bone destruction happens more quickly than new bone creation. After menopause, you may be at an increased risk for osteoporosis. To help prevent osteoporosis or the bone fractures that can happen because of osteoporosis, the following is recommended:  If you are 19-50 years old, get at least 1,000 mg of calcium and at least 600 mg of vitamin D per day.  If you are older than age 50 but younger than age 70, get at least 1,200 mg of calcium and at least 600 mg of vitamin D per day.  If you are older than age 70, get at least 1,200 mg of calcium and at least 800 mg of vitamin D per day.  Smoking and excessive alcohol intake increase the risk of osteoporosis. Eat foods that are rich in calcium and vitamin D, and do weight-bearing exercises several times each week as directed by your health care provider. What should I know about how menopause affects my mental  health? Depression may occur at any age, but it is more common as you become older. Common symptoms of depression include:  Low or sad mood.  Changes in sleep patterns.  Changes in appetite or eating patterns.  Feeling an overall lack of motivation or enjoyment of activities that you previously enjoyed.  Frequent crying spells.  Talk with your health care provider if you think that you are experiencing depression. What should I know about immunizations? It is important that you get and maintain your immunizations. These include:  Tetanus, diphtheria, and pertussis (Tdap) booster vaccine.  Influenza every year before the flu season begins.  Pneumonia vaccine.  Shingles vaccine.  Your health care provider may also recommend other immunizations. This information is not intended to replace advice given to   your health care provider. Make sure you discuss any questions you have with your health care provider. Document Released: 09/12/2005 Document Revised: 02/08/2016 Document Reviewed: 04/24/2015 Elsevier Interactive Patient Education  2018 [employer] Maintenance for Postmenopausal Women Menopause is a normal process in which your reproductive ability comes to an end. This process happens gradually over a span of months to years, usually between the ages of 59 and 29. Menopause is complete when you have missed 12 consecutive menstrual periods. It is important to talk with your health care provider about some of the most common conditions that affect postmenopausal women, such as heart disease, cancer, and bone loss (osteoporosis). Adopting a healthy lifestyle and getting preventive care can help to promote your health and wellness. Those actions can also lower your chances of developing some of these common conditions. What should I know about menopause? During menopause, you may experience a number of symptoms, such as:  Moderate-to-severe hot flashes.  Night  sweats.  Decrease in sex drive.  Mood swings.  Headaches.  Tiredness.  Irritability.  Memory problems.  Insomnia.  Choosing to treat or not to treat menopausal changes is an individual decision that you make with your health care provider. What should I know about hormone replacement therapy and supplements? Hormone therapy products are effective for treating symptoms that are associated with menopause, such as hot flashes and night sweats. Hormone replacement carries certain risks, especially as you become older. If you are thinking about using estrogen or estrogen with progestin treatments, discuss the benefits and risks with your health care provider. What should I know about heart disease and stroke? Heart disease, heart attack, and stroke become more likely as you age. This may be due, in part, to the hormonal changes that your body experiences during menopause. These can affect how your body processes dietary fats, triglycerides, and cholesterol. Heart attack and stroke are both medical emergencies. There are many things that you can do to help prevent heart disease and stroke:  Have your blood pressure checked at least every 1-2 years. High blood pressure causes heart disease and increases the risk of stroke.  If you are 26-22 years old, ask your health care provider if you should take aspirin to prevent a heart attack or a stroke.  Do not use any tobacco products, including cigarettes, chewing tobacco, or electronic cigarettes. If you need help quitting, ask your health care provider.  It is important to eat a healthy diet and maintain a healthy weight. ? Be sure to include plenty of vegetables, fruits, low-fat dairy products, and lean protein. ? Avoid eating foods that are high in solid fats, added sugars, or salt (sodium).  Get regular exercise. This is one of the most important things that you can do for your health. ? Try to exercise for at least 150 minutes each week.  The type of exercise that you do should increase your heart rate and make you sweat. This is known as moderate-intensity exercise. ? Try to do strengthening exercises at least twice each week. Do these in addition to the moderate-intensity exercise.  Know your numbers.Ask your health care provider to check your cholesterol and your blood glucose. Continue to have your blood tested as directed by your health care provider.  What should I know about cancer screening? There are several types of cancer. Take the following steps to reduce your risk and to catch any cancer development as early as possible. Breast Cancer  Practice breast self-awareness. ?  This means understanding how your breasts normally appear and feel. ? It also means doing regular breast self-exams. Let your health care provider know about any changes, no matter how small.  If you are 35 or older, have a clinician do a breast exam (clinical breast exam or CBE) every year. Depending on your age, family history, and medical history, it may be recommended that you also have a yearly breast X-ray (mammogram).  If you have a family history of breast cancer, talk with your health care provider about genetic screening.  If you are at high risk for breast cancer, talk with your health care provider about having an MRI and a mammogram every year.  Breast cancer (BRCA) gene test is recommended for women who have family members with BRCA-related cancers. Results of the assessment will determine the need for genetic counseling and BRCA1 and for BRCA2 testing. BRCA-related cancers include these types: ? Breast. This occurs in males or females. ? Ovarian. ? Tubal. This may also be called fallopian tube cancer. ? Cancer of the abdominal or pelvic lining (peritoneal cancer). ? Prostate. ? Pancreatic.  Cervical, Uterine, and Ovarian Cancer Your health care provider may recommend that you be screened regularly for cancer of the pelvic  organs. These include your ovaries, uterus, and vagina. This screening involves a pelvic exam, which includes checking for microscopic changes to the surface of your cervix (Pap test).  For women ages 21-65, health care providers may recommend a pelvic exam and a Pap test every three years. For women ages 91-65, they may recommend the Pap test and pelvic exam, combined with testing for human papilloma virus (HPV), every five years. Some types of HPV increase your risk of cervical cancer. Testing for HPV may also be done on women of any age who have unclear Pap test results.  Other health care providers may not recommend any screening for nonpregnant women who are considered low risk for pelvic cancer and have no symptoms. Ask your health care provider if a screening pelvic exam is right for you.  If you have had past treatment for cervical cancer or a condition that could lead to cancer, you need Pap tests and screening for cancer for at least 20 years after your treatment. If Pap tests have been discontinued for you, your risk factors (such as having a new sexual partner) need to be reassessed to determine if you should start having screenings again. Some women have medical problems that increase the chance of getting cervical cancer. In these cases, your health care provider may recommend that you have screening and Pap tests more often.  If you have a family history of uterine cancer or ovarian cancer, talk with your health care provider about genetic screening.  If you have vaginal bleeding after reaching menopause, tell your health care provider.  There are currently no reliable tests available to screen for ovarian cancer.  Lung Cancer Lung cancer screening is recommended for adults 58-20 years old who are at high risk for lung cancer because of a history of smoking. A yearly low-dose CT scan of the lungs is recommended if you:  Currently smoke.  Have a history of at least 30 pack-years of  smoking and you currently smoke or have quit within the past 15 years. A pack-year is smoking an average of one pack of cigarettes per day for one year.  Yearly screening should:  Continue until it has been 15 years since you quit.  Stop if you develop  a health problem that would prevent you from having lung cancer treatment.  Colorectal Cancer  This type of cancer can be detected and can often be prevented.  Routine colorectal cancer screening usually begins at age 6 and continues through age 31.  If you have risk factors for colon cancer, your health care provider may recommend that you be screened at an earlier age.  If you have a family history of colorectal cancer, talk with your health care provider about genetic screening.  Your health care provider may also recommend using home test kits to check for hidden blood in your stool.  A small camera at the end of a tube can be used to examine your colon directly (sigmoidoscopy or colonoscopy). This is done to check for the earliest forms of colorectal cancer.  Direct examination of the colon should be repeated every 5-10 years until age 62. However, if early forms of precancerous polyps or small growths are found or if you have a family history or genetic risk for colorectal cancer, you may need to be screened more often.  Skin Cancer  Check your skin from head to toe regularly.  Monitor any moles. Be sure to tell your health care provider: ? About any new moles or changes in moles, especially if there is a change in a mole's shape or color. ? If you have a mole that is larger than the size of a pencil eraser.  If any of your family members has a history of skin cancer, especially at a young age, talk with your health care provider about genetic screening.  Always use sunscreen. Apply sunscreen liberally and repeatedly throughout the day.  Whenever you are outside, protect yourself by wearing long sleeves, pants, a wide-brimmed  hat, and sunglasses.  What should I know about osteoporosis? Osteoporosis is a condition in which bone destruction happens more quickly than new bone creation. After menopause, you may be at an increased risk for osteoporosis. To help prevent osteoporosis or the bone fractures that can happen because of osteoporosis, the following is recommended:  If you are 71-37 years old, get at least 1,000 mg of calcium and at least 600 mg of vitamin D per day.  If you are older than age 4 but younger than age 7, get at least 1,200 mg of calcium and at least 600 mg of vitamin D per day.  If you are older than age 65, get at least 1,200 mg of calcium and at least 800 mg of vitamin D per day.  Smoking and excessive alcohol intake increase the risk of osteoporosis. Eat foods that are rich in calcium and vitamin D, and do weight-bearing exercises several times each week as directed by your health care provider. What should I know about how menopause affects my mental health? Depression may occur at any age, but it is more common as you become older. Common symptoms of depression include:  Low or sad mood.  Changes in sleep patterns.  Changes in appetite or eating patterns.  Feeling an overall lack of motivation or enjoyment of activities that you previously enjoyed.  Frequent crying spells.  Talk with your health care provider if you think that you are experiencing depression. What should I know about immunizations? It is important that you get and maintain your immunizations. These include:  Tetanus, diphtheria, and pertussis (Tdap) booster vaccine.  Influenza every year before the flu season begins.  Pneumonia vaccine.  Shingles vaccine.  Your health care provider may also  recommend other immunizations. This information is not intended to replace advice given to you by your health care provider. Make sure you discuss any questions you have with your health care provider. Document Released:  09/12/2005 Document Revised: 02/08/2016 Document Reviewed: 04/24/2015 Elsevier Interactive Patient Education  2018 [employer].

## 2017-11-26 NOTE — Progress Notes (Signed)
Subjective:    Patient ID: [name] [name], female    DOB: [birth year], 83 y.o.   MRN: [id number]  HPI  83 year old patient who is seen today for a preventive health examination and subsequent Medicare wellness visit  She is followed closely by rheumatology with RA.  She has essential hypertension and also a history of hypothyroidism.  She has had an enlarging mass involving the right popliteal area that has become more irritated periodically subsequent Medicare wellness visit  Allergies:  1) Codeine Phosphate (Codeine Phosphate)   Past History:   Hyperlipidemia  Hypertension  Hypothyroidism  Rheumatoid arthritis 2002  Menopausal syndrome   Past Surgical History:   Appendectomy  Cholecystectomy laparoscopic 2002  Hysterectomy age 68  colonoscopy in May 2009 C-spine surgery May 2012 4 left-sided radiculopathy ([facility]) Retinal surgery October 2013 ([facility])  Family History:   father died at age 49, MI, history of diabetes  mother died age 27, status post hip fracture  grandparents, strongly positive for diabetes  Six brothers 3 sisters  three brothers deceased, age 65 , 77, [place]; one brother had prostate cancer, age 71     Past Medical History:  Diagnosis Date  . HYPERLIPIDEMIA 01/19/2007  . HYPERTENSION 01/19/2007  . HYPOTHYROIDISM 01/19/2007  . Rheumatoid arthritis(714.0) 01/19/2007     Social History   Socioeconomic History  . Marital status: Married    Spouse name: Not on file  . Number of children: Not on file  . Years of education: Not on file  . Highest education level: Not on file  Occupational History  . Not on file  Social Needs  . Financial resource strain: Not on file  . Food insecurity:    Worry: Not on file    Inability: Not on file  . Transportation needs:    Medical: Not on file    Non-medical: Not on file  Tobacco Use  . Smoking status: Never Smoker  . Smokeless tobacco: Never Used  Substance and Sexual Activity  . Alcohol use: No   . Drug use: No  . Sexual activity: Not on file  Lifestyle  . Physical activity:    Days per week: Not on file    Minutes per session: Not on file  . Stress: Not on file  Relationships  . Social connections:    Talks on phone: Not on file    Gets together: Not on file    Attends religious service: Not on file    Active member of club or organization: Not on file    Attends meetings of clubs or organizations: Not on file    Relationship status: Not on file  . Intimate partner violence:    Fear of current or ex partner: Not on file    Emotionally abused: Not on file    Physically abused: Not on file    Forced sexual activity: Not on file  Other Topics Concern  . Not on file  Social History Narrative  . Not on file    Past Surgical History:  Procedure Laterality Date  . ABDOMINAL HYSTERECTOMY    . APPENDECTOMY    . CHOLECYSTECTOMY    . NECK SURGERY      Family History  Problem Relation Age of Onset  . Diabetes Father   . Heart disease Father   . Cancer Sister        breast   . Heart attack Brother   . Heart attack Brother   . Heart attack Brother   .  Heart disease Sister     Allergies  Allergen Reactions  . Tramadol Nausea Only  . Codeine Phosphate Nausea Only    REACTION: unspecified    Current Outpatient Medications on File Prior to Visit  Medication Sig Dispense Refill  . benazepril-hydrochlorthiazide (LOTENSIN HCT) 10-12.5 MG tablet Take 1 tablet by mouth daily. 90 tablet .3  . hydroxychloroquine (PLAQUENIL) 200 MG tablet Take 1 tablet (200 mg total) by mouth daily. 30 tablet 2  . levothyroxine (SYNTHROID, LEVOTHROID) 75 MCG tablet Take 1 tablet (75 mcg total) by mouth daily. 90 tablet 3  . Multiple Vitamins-Minerals (MULTIVITAMIN WITH MINERALS) tablet Take 1 tablet by mouth daily.    Marland Kitchen OVER THE COUNTER MEDICATION Place 1 drop into both eyes daily. "hyper tear"    . Polyethyl Glycol-Propyl Glycol (SYSTANE OP) Apply to eye daily.     No current  facility-administered medications on file prior to visit.     BP 118/70 (BP Location: Right Arm, Patient Position: Sitting, Cuff Size: Large)   Pulse 72   Temp 98.2 F (36.8 C) (Oral)   Ht 5\' 1"  (1.549 m)   Wt 137 lb (62.1 kg)   SpO2 98%   BMI 25.89 kg/m   Subsequent Medicare wellness visit  1. Risk factors, based on past  M,S,F history.  Cardiovascular risk factors include a history of hypertension and dyslipidemia  2.  Physical activities: Remains surprisingly active in spite of her rheumatologic challenges;  walks several times per week  3.  Depression/mood: No history of major depression or mood disorder  4.  Hearing: Mild deficits only  5.  ADL's: Totally independent  6.  Fall risk: Moderate due to age and arthritis of the knees  7.  Home safety: No problems identified  8.  Height weight, and visual acuity; height and weight stable no change in visual acuity is followed by ophthalmology twice annually due to Plaquenil therapy  9.  Counseling: Continue active lifestyle and heart healthy diet  10. Lab orders based on risk factors: We will review a TSH  11. Referral : Follow with rheumatology.  Will refer to general surgery for consideration of resection of a non-mass in the right popliteal area  12. Care plan: Continue efforts at aggressive risk factor modification and aggressive treatment of RA  13. Cognitive assessment:  Alert and oriented with normal affect.  No cognitive dysfunction 14. Screening: Patient provided with a written and personalized 5-10 year screening schedule in the AVS.    15. Provider List Update: Primary care rheumatology general surgery    Review of Systems  Constitutional: Negative.   HENT: Negative for congestion, dental problem, hearing loss, rhinorrhea, sinus pressure, sore throat and tinnitus.   Eyes: Negative for pain, discharge and visual disturbance.  Respiratory: Negative for cough and shortness of breath.   Cardiovascular:  Negative for chest pain, palpitations and leg swelling.  Gastrointestinal: Negative for abdominal distention, abdominal pain, blood in stool, constipation, diarrhea, nausea and vomiting.  Genitourinary: Negative for difficulty urinating, dysuria, flank pain, frequency, hematuria, pelvic pain, urgency, vaginal bleeding, vaginal discharge and vaginal pain.  Musculoskeletal: Positive for arthralgias and joint swelling. Negative for gait problem.  Skin: Positive for wound. Negative for rash.  Neurological: Negative for dizziness, syncope, speech difficulty, weakness, numbness and headaches.  Hematological: Negative for adenopathy.  Psychiatric/Behavioral: Negative for agitation, behavioral problems and dysphoric mood. The patient is not nervous/anxious.        Objective:   Physical Exam  Constitutional: She is oriented  to person, place, and time. She appears well-developed and well-nourished.  HENT:  Head: Normocephalic.  Right Ear: External ear normal.  Left Ear: External ear normal.  Mouth/Throat: Oropharynx is clear and moist.  Eyes: Pupils are equal, round, and reactive to light. Conjunctivae and EOM are normal.  Neck: Normal range of motion. Neck supple. No thyromegaly present.  Cardiovascular: Normal rate, regular rhythm and intact distal pulses.  Murmur heard. Dorsalis pedis pulses full.  Posterior tibial pulses faint  Grade 3/6 systolic murmur  Pulmonary/Chest: Effort normal and breath sounds normal.  Abdominal: Soft. Bowel sounds are normal. She exhibits no mass. There is no tenderness.  Musculoskeletal: Normal range of motion.  Surgical scar right knee  Lymphadenopathy:    She has no cervical adenopathy.  Neurological: She is alert and oriented to person, place, and time.  Skin: Skin is warm and dry. No rash noted.  3 cm pedunculated lesion in the right popliteal fossa  Psychiatric: She has a normal mood and affect. Her behavior is normal.          Assessment & Plan:     Preventive health examination Subsequent Medicare wellness visit Hypothyroidism.  Will review a TSH Dyslipidemia heart healthy diet encouraged Essential hypertension well-controlled Right popliteal mass.  This has been enlarging fairly rapidly and is causing some irritation.  We will set up for general surgery consultation RA.  Rheumatology follow-up  Return here in 6 to 12 months to  [facility]

## 2017-11-29 ENCOUNTER — Other Ambulatory Visit: Payer: Self-pay | Admitting: Rheumatology

## 2017-11-30 NOTE — Telephone Encounter (Addendum)
Last visit: 10/29/17 Next Visit: 01/19/18 Labs: 10/08/17 stable PLQ eye exam WNL 09/22/16  Attempted to contact the patient and left message to advise we need update plq eye exam   Okay to refill 30 day supply per Dr. Estanislado Pandy

## 2017-12-25 ENCOUNTER — Other Ambulatory Visit: Payer: Self-pay | Admitting: Rheumatology

## 2017-12-25 NOTE — Telephone Encounter (Signed)
Last visit: 10/29/17 Next Visit: 01/19/18 Labs: 10/08/17 stable PLQ eye exam WNL 09/24/17  Okay to refill per Dr.Deveshwar

## 2018-01-01 ENCOUNTER — Other Ambulatory Visit: Payer: Self-pay

## 2018-01-01 DIAGNOSIS — Z79899 Other long term (current) drug therapy: Secondary | ICD-10-CM

## 2018-01-02 LAB — COMPLETE METABOLIC PANEL WITH GFR
AG Ratio: 1.3 (calc) (ref 1.0–2.5)
ALT: 12 U/L (ref 6–29)
AST: 23 U/L (ref 10–35)
Albumin: 4 g/dL (ref 3.6–5.1)
Alkaline phosphatase (APISO): 57 U/L (ref 33–130)
BUN/Creatinine Ratio: 23 (calc) — ABNORMAL HIGH (ref 6–22)
BUN: 35 mg/dL — ABNORMAL HIGH (ref 7–25)
CALCIUM: 9.6 mg/dL (ref 8.6–10.4)
CO2: 30 mmol/L (ref 20–32)
CREATININE: 1.52 mg/dL — AB (ref 0.60–0.88)
Chloride: 104 mmol/L (ref 98–110)
GFR, EST NON AFRICAN AMERICAN: 32 mL/min/{1.73_m2} — AB (ref >=60)
GFR, Est African American: 37 mL/min/{1.73_m2} — ABNORMAL LOW (ref >=60)
GLOBULIN: 3 g/dL (ref 1.9–3.7)
Glucose, Bld: 97 mg/dL (ref 65–99)
Potassium: 5.2 mmol/L (ref 3.5–5.3)
Sodium: 141 mmol/L (ref 135–146)
Total Bilirubin: 0.4 mg/dL (ref 0.2–1.2)
Total Protein: 7 g/dL (ref 6.1–8.1)

## 2018-01-02 LAB — CBC WITH DIFFERENTIAL/PLATELET
BASOS PCT: 0.8 %
Basophils Absolute: 47 cells/uL (ref 0–200)
EOS ABS: 130 {cells}/uL (ref 15–500)
Eosinophils Relative: 2.2 %
HCT: 35.5 % (ref 35.0–45.0)
HEMOGLOBIN: 11.8 g/dL (ref 11.7–15.5)
Lymphs Abs: 2726 cells/uL (ref 850–3900)
MCH: 26.3 pg — AB (ref 27.0–33.0)
MCHC: 33.2 g/dL (ref 32.0–36.0)
MCV: 79.1 fL — AB (ref 80.0–100.0)
MPV: 9.4 fL (ref 7.5–12.5)
Monocytes Relative: 8.7 %
NEUTROS ABS: 2484 {cells}/uL (ref 1500–7800)
Neutrophils Relative %: 42.1 %
Platelets: 271 10*3/uL (ref 140–400)
RBC: 4.49 10*6/uL (ref 3.80–5.10)
RDW: 14.2 % (ref 11.0–15.0)
Total Lymphocyte: 46.2 %
WBC: 5.9 10*3/uL (ref 3.8–10.8)
WBCMIX: 513 {cells}/uL (ref 200–950)

## 2018-01-04 NOTE — Progress Notes (Signed)
Pt is on HCTZ which is most likely cause of elevated Cr. Please fax results to her  PCP

## 2018-01-05 NOTE — Progress Notes (Deleted)
Office Visit Note  Patient: [name] [name]             Date of Birth: [birth year]           MRN: [id number]             PCP: Marletta Lor, MD Referring: Marletta Lor, MD Visit Date: 01/19/2018 Occupation: @GUAROCC @    Subjective:  No chief complaint on file.   History of Present Illness: [name] [name] is a 82 y.o. female ***   Activities of Daily Living:  Patient reports morning stiffness for *** {minute/hour:19697}.   Patient {ACTIONS;DENIES/REPORTS:21021675::"Denies"} nocturnal pain.  Difficulty dressing/grooming: {ACTIONS;DENIES/REPORTS:21021675::"Denies"} Difficulty climbing stairs: {ACTIONS;DENIES/REPORTS:21021675::"Denies"} Difficulty getting out of chair: {ACTIONS;DENIES/REPORTS:21021675::"Denies"} Difficulty using hands for taps, buttons, cutlery, and/or writing: {ACTIONS;DENIES/REPORTS:21021675::"Denies"}   No Rheumatology ROS completed.   PMFS History:  Patient Active Problem List   Diagnosis Date Noted  . Chronic kidney disease (CKD) stage G3b 01/21/2017  . History of hypothyroidism 01/20/2017  . History of congestive heart failure 08/26/2016  . Osteopenia of multiple sites 08/25/2016  . DDD cervical spine 08/25/2016  . Primary osteoarthritis of both hands 08/25/2016  . High risk medication use 06/11/2016  . CHF, acute ([place]) 02/01/2013  . 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 Hypothyroidism 01/19/2007  . Dyslipidemia 01/19/2007  . Essential hypertension 01/19/2007  . Rheumatoid arthritis ([place]) 01/19/2007    Past Medical History:  Diagnosis Date  . HYPERLIPIDEMIA 01/19/2007  . HYPERTENSION 01/19/2007  . HYPOTHYROIDISM 01/19/2007  . Rheumatoid arthritis(714.0) 01/19/2007    Family History  Problem Relation Age of Onset  . Diabetes Father   . Heart disease Father   . Cancer Sister        breast   . Heart attack Brother   . Heart attack Brother   . Heart attack Brother   . Heart disease Sister    Past Surgical History:  Procedure  Laterality Date  . ABDOMINAL HYSTERECTOMY    . APPENDECTOMY    . CHOLECYSTECTOMY    . NECK SURGERY     Social History   Social History Narrative  . Not on file     Objective: Vital Signs: There were no vitals taken for this visit.   Physical Exam   Musculoskeletal Exam: ***  CDAI Exam: No CDAI exam completed.    Investigation: No additional findings. CBC Latest Ref Rng & Units 01/01/2018 10/08/2017 01/22/2017  WBC 3.8 - 10.8 Thousand/uL 5.9 6.7 7.5  Hemoglobin 11.7 - 15.5 g/dL 11.8 12.7 12.7  Hematocrit 35.0 - 45.0 % 35.5 38.5 39.1  Platelets 140 - 400 Thousand/uL 271 335 250   CMP Latest Ref Rng & Units 01/01/2018 10/08/2017 01/22/2017  Glucose 65 - 99 mg/dL 97 98 87  BUN 7 - 25 mg/dL 35(H) 20 22  Creatinine 0.60 - 0.88 mg/dL 1.52(H) 1.32(H) 1.20(H)  Sodium 135 - 146 mmol/L 141 140 138  Potassium 3.5 - 5.3 mmol/L 5.2 4.8 5.2  Chloride 98 - 110 mmol/L 104 102 100  CO2 20 - 32 mmol/L 30 29 25   Calcium 8.6 - 10.4 mg/dL 9.6 10.2 9.9  Total Protein 6.1 - 8.1 g/dL 7.0 7.7 7.1  Total Bilirubin 0.2 - 1.2 mg/dL 0.4 0.4 0.4  Alkaline Phos 33 - 130 U/L - - 49  AST 10 - 35 U/L 23 21 20   ALT 6 - 29 U/L 12 10 11      Imaging: No results found.  Speciality Comments: PLQ Eye Exam: 09/24/17 WNL @ [facility] Follow up  in 6 months    Procedures:  No procedures performed Allergies: Tramadol and Codeine phosphate   Assessment / Plan:     Visit Diagnoses: No diagnosis found.    Orders: No orders of the defined types were placed in this encounter.  No orders of the defined types were placed in this encounter.   Face-to-face time spent with patient was *** minutes. 50% of time was spent in counseling and coordination of care.  Follow-Up Instructions: No follow-ups on file.   Ofilia Neas, PA-C  Note - This record has been created using Dragon software.  Chart creation errors have been sought, but may not always  have been located. Such creation errors do not  reflect on  the standard of medical care.

## 2018-01-07 ENCOUNTER — Telehealth: Payer: Self-pay | Admitting: Rheumatology

## 2018-01-07 NOTE — Telephone Encounter (Signed)
Patient called stating she was returning your call.   °

## 2018-01-07 NOTE — Telephone Encounter (Signed)
Patient advised of lab results and copy sent to PCP.  

## 2018-01-10 ENCOUNTER — Other Ambulatory Visit: Payer: Self-pay | Admitting: Internal Medicine

## 2018-01-19 ENCOUNTER — Ambulatory Visit: Payer: MEDICARE | Admitting: Physician Assistant

## 2018-01-28 NOTE — Progress Notes (Signed)
Office Visit Note  Patient: [name] [name]             Date of Birth: 1934-11-14           MRN: [id number]             PCP: Marletta Lor, MD Referring: Marletta Lor, MD Visit Date: 02/10/2018 Occupation: @GUAROCC @    Subjective:  Medication monitoring   History of Present Illness: [name] [name] is a 83 y.o. female with history of seropositive rheumatoid arthritis, DDD and osteoarthritis.  Patient is on Plaquenil 200 mg 1 tablet by mouth daily.  Patient denies any joint pain or joint swelling at this time.  He states that since her last visit she has not had any recent flares.  She states that her pain and swelling is improved significantly.  She states that after the right knee cortisone injection her pain resolved.  She denies any neck pain or stiffness at this time.  She states she will occasionally have some discomfort and will take Tylenol for pain relief.  She denies any joint stiffness at this time.  Activities of Daily Living:  Patient reports morning stiffness  none.   Patient Denies nocturnal pain.  Difficulty dressing/grooming: Denies Difficulty climbing stairs: Denies Difficulty getting out of chair: Denies Difficulty using hands for taps, buttons, cutlery, and/or writing: Denies   Review of Systems  Constitutional: Negative for fatigue.  HENT: Negative for mouth sores, mouth dryness and nose dryness.   Eyes: Positive for dryness. Negative for pain and visual disturbance.  Respiratory: Negative for cough, hemoptysis, shortness of breath and difficulty breathing.   Cardiovascular: Positive for swelling in legs/feet. Negative for chest pain, palpitations and hypertension.  Gastrointestinal: Negative for blood in stool, constipation and diarrhea.  Endocrine: Negative for increased urination.  Genitourinary: Negative for difficulty urinating and painful urination.  Musculoskeletal: Negative for arthralgias, joint pain, joint swelling, myalgias, muscle  weakness, morning stiffness, muscle tenderness and myalgias.  Skin: Negative for color change, pallor, rash, hair loss, nodules/bumps, skin tightness, ulcers and sensitivity to sunlight.  Allergic/Immunologic: Negative for susceptible to infections.  Neurological: Negative for dizziness, numbness, headaches and weakness.  Hematological: Negative for bruising/bleeding tendency and swollen glands.  Psychiatric/Behavioral: Negative for depressed mood and sleep disturbance. The patient is not nervous/anxious.     PMFS History:  Patient Active Problem List   Diagnosis Date Noted  . Chronic kidney disease (CKD) stage G3b 01/21/2017  . History of hypothyroidism 01/20/2017  . History of congestive heart failure 08/26/2016  . Osteopenia of multiple sites 08/25/2016  . DDD cervical spine 08/25/2016  . Primary osteoarthritis of both hands 08/25/2016  . High risk medication use 06/11/2016  . CHF, acute ([place]) 02/01/2013  . 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 Hypothyroidism 01/19/2007  . Dyslipidemia 01/19/2007  . Essential hypertension 01/19/2007  . Rheumatoid arthritis ([place]) 01/19/2007    Past Medical History:  Diagnosis Date  . HYPERLIPIDEMIA 01/19/2007  . HYPERTENSION 01/19/2007  . HYPOTHYROIDISM 01/19/2007  . Rheumatoid arthritis(714.0) 01/19/2007    Family History  Problem Relation Age of Onset  . Diabetes Father   . Heart disease Father   . Cancer Sister        breast   . Heart attack Brother   . Heart attack Brother   . Heart attack Brother   . Heart disease Sister    Past Surgical History:  Procedure Laterality Date  . ABDOMINAL HYSTERECTOMY    . APPENDECTOMY    .  CHOLECYSTECTOMY    . NECK SURGERY     Social History   Social History Narrative  . Not on file     Objective: Vital Signs: BP (!) 117/59 (BP Location: Left Arm, Patient Position: Sitting, Cuff Size: Normal)   Pulse 85   Resp 16   Ht 5\' 1"  (1.549 m)   Wt 141 lb (64 kg)   BMI 26.64 kg/m    Physical Exam    Constitutional: She is oriented to person, place, and time. She appears well-developed and well-nourished.  HENT:  Head: Normocephalic and atraumatic.  Eyes: Conjunctivae and EOM are normal.  Neck: Normal range of motion.  Cardiovascular: Normal rate, regular rhythm and intact distal pulses.  Murmur heard. Pulmonary/Chest: Effort normal and breath sounds normal.  Abdominal: Soft. Bowel sounds are normal.  Lymphadenopathy:    She has no cervical adenopathy.  Neurological: She is alert and oriented to person, place, and time.  Skin: Skin is warm and dry. Capillary refill takes less than 2 seconds.  Psychiatric: She has a normal mood and affect. Her behavior is normal.  Nursing note and vitals reviewed.    Musculoskeletal Exam:    CDAI Exam: CDAI Homunculus Exam:   Swelling:  Right hand: 3rd MCP Left hand: 1st MCP  Joint Counts:  CDAI Tender Joint count: 0 CDAI Swollen Joint count: 2  Global Assessments:  Patient Global Assessment: 1 Provider Global Assessment: 1  CDAI Calculated Score: 4    Investigation: No additional findings.PLQ eye exam: 09/24/2017 CBC Latest Ref Rng & Units 01/01/2018 10/08/2017 01/22/2017  WBC 3.8 - 10.8 Thousand/uL 5.9 6.7 7.5  Hemoglobin 11.7 - 15.5 g/dL 11.8 12.7 12.7  Hematocrit 35.0 - 45.0 % 35.5 38.5 39.1  Platelets 140 - 400 Thousand/uL 271 335 250   CMP Latest Ref Rng & Units 01/01/2018 10/08/2017 01/22/2017  Glucose 65 - 99 mg/dL 97 98 87  BUN 7 - 25 mg/dL 35(H) 20 22  Creatinine 0.60 - 0.88 mg/dL 1.52(H) 1.32(H) 1.20(H)  Sodium 135 - 146 mmol/L 141 140 138  Potassium 3.5 - 5.3 mmol/L 5.2 4.8 5.2  Chloride 98 - 110 mmol/L 104 102 100  CO2 20 - 32 mmol/L 30 29 25   Calcium 8.6 - 10.4 mg/dL 9.6 10.2 9.9  Total Protein 6.1 - 8.1 g/dL 7.0 7.7 7.1  Total Bilirubin 0.2 - 1.2 mg/dL 0.4 0.4 0.4  Alkaline Phos 33 - 130 U/L - - 49  AST 10 - 35 U/L 23 21 20   ALT 6 - 29 U/L 12 10 11     Imaging: Xr Foot 2 Views Left  Result Date:  02/10/2018 First MTP all PIP and DIP severe narrowing was noted.  Narrowing of the third and fifth MTP joints was noted.  Erosive changes were noted in the third of and fifth MTP joints.  Intertarsal joint space narrowing was noted.  A calcaneal spur was noted. Impression: These findings are consistent with osteoarthritis and rheumatoid arthritis overlap.  Xr Foot 2 Views Right  Result Date: 02/10/2018 First MTP, all PIP and DIP joint space narrowing was noted.  Narrowing of fifth MTP joint with erosive changes were noted.  Erosive changes were also noted in the first PIP joint.  Intertarsal joint space narrowing with cystic versus erosive changes were noted.  A posterior inferior calcaneal spur was noted. Impression: These findings are consistent with rheumatoid arthritis and osteoarthritis overlap.  Xr Hand 2 View Left  Result Date: 02/10/2018 Juxta-articular osteopenia was noted.  PIP and DIP  joint space narrowing was noted.  Narrowing of all MCP joints was noted.  Erosive changes were noted in first and second MCP joints.  Mild intercarpal joint space narrowing was noted.  No erosive changes in the carpal bones was noted.  Radiographic progression was noted on comparison films from 2013. Impression: These findings are consistent with rheumatoid arthritis and osteoarthritis overlap.  Xr Hand 2 View Right  Result Date: 02/10/2018 First MCP narrowing and subluxation was noted.  Second and third MCP severe narrowing was noted.  Juxta-articular osteopenia was noted.  All PIP and DIP joint space narrowing was noted.  Possible erosion was noted in the first third and fifth MCP joints.  No significant intercarpal radiocarpal joint space narrowing was noted.  Radiographic progression was noted on comparison films from 2013. Impression: These findings are consistent with erosive rheumatoid arthritis and osteoarthritis overlap.   Speciality Comments: PLQ Eye Exam: 09/24/17 WNL @ [facility] Follow up  in 6 months    Procedures:  No procedures performed Allergies: Tramadol and Codeine phosphate   Assessment / Plan:     Visit Diagnoses: Rheumatoid arthritis involving multiple sites with positive rheumatoid factor ([place]) -She continues to have synovitis in her right third MCP and left first MCP joint.  She has synovitis of her MCP joints at her last visit as well as warmth and swelling of her right knee joint.  She has no tenderness on exam.  She has no warmth or effusion of bilateral knee joints.  Her right knee pain and swelling improved following the right knee cortisone injection at her last visit.  She denies any recent flares since her last visit.  She denies any joint pain or joint stiffness at this time.   At her last visit starting Orencia IV or subcutaneous injections was discussed. She does not want to start on a self-injectable medication at this times.  Due to her elevated creatinine as well as history of congestive heart failure her options are limited.  She would like to continue taking Plaquenil 200 mg 1 tablet by mouth daily.  We will obtain x-rays of bilateral hands and bilateral feet to track the progression of rheumatoid arthritis.  X-rays were compared with previous films from 2013 and she has further progression and erosions.  We discussed the importance of adding on an additional medication.  She has tried methotrexate in the past but discontinued due to elevated creatinine.  We discussed the indications, contraindications, and potential side effects of Arava.  She will be started on Arava 10 mg by mouth daily.  A prescription was sent to the pharmacy.  Consent was obtained today.  She is advised to notify us if she has any further questions.  She will notify us as well if she develops increased joint pain or joint swelling.  We will see her back in 3 months.  Plan: XR Foot 2 Views Right, XR Foot 2 Views Left, XR Hand 2 View Left, XR Hand 2 View Right  Medication counseling:    Patient was counseled on the purpose, proper use, and adverse effects of leflunomide including risk of infection, nausea/diarrhea/weight loss, increase in blood pressure, rash, hair loss, tingling in the hands and feet, and signs and symptoms of interstitial lung disease.  Discussed the importance of frequent monitoring of liver function and blood counts, and patient was provided with instructions for standing labs.  Provided patient with educational materials on leflunomide and answered all questions.  Patient consented to [place] use, and  consent will be uploaded into the media tab.     High risk medication use - PLQ.eye exam: 09/24/2017.  CBC and CMP were drawn on 01/01/2018.  She will return for lab work in 2 weeks, then 2 months, then every 3 months.  She has standing orders in place.  Primary osteoarthritis of both hands: She has PIP and DIP synovial thickening consistent with osteoarthritis of bilateral hands.  She is complete fist formation bilaterally.  She has no discomfort in her hands at this time.  Joint protection and muscle strengthening were discussed.  Osteopenia of multiple sites  DDD (degenerative disc disease), cervical: She is good range of motion with no discomfort.  Other medical conditions are listed as follows:  Chronic kidney disease (CKD) stage G3b  History of hypertension  History of congestive heart failure  History of hypothyroidism  Dyslipidemia    Orders: Orders Placed This Encounter  Procedures  . XR Foot 2 Views Right  . XR Foot 2 Views Left  . XR Hand 2 View Left  . XR Hand 2 View Right   Meds ordered this encounter  Medications  . leflunomide (ARAVA) 10 MG tablet    Sig: Take 1 tablet (10 mg total) by mouth daily.    Dispense:  30 tablet    Refill:  0    Face-to-face time spent with patient was 30 minutes. Greater than 50% of time was spent in counseling and coordination of care.  Follow-Up Instructions: Return in about 3 months (around  05/13/2018) for Rheumatoid arthritis, Osteoarthritis, DDD.   Ofilia Neas, PA-C I examined and evaluated the patient with [name] PA.  Patient continues to have some synovitis on my examination today.  She does not want to modify her treatment.  She does have severe erosive disease with synovitis.  X-rays today reveal erosive changes and overlap of rheumatoid arthritis and osteoarthritis.  She had radiographic progression from her previous x-rays of 2013.  We had detailed discussion regarding different treatment options including due to elevated creatinine we could not use methotrexate in the past.  Today she was convinced to start on leflunomide.  The plan of care was discussed as noted above.  Ofilia Neas, PA-C Note - This record has been created using Dragon software.  Chart creation errors have been sought, but may not always  have been located. Such creation errors do not reflect on  the standard of medical care.

## 2018-02-10 ENCOUNTER — Ambulatory Visit (INDEPENDENT_AMBULATORY_CARE_PROVIDER_SITE_OTHER): Payer: Self-pay

## 2018-02-10 ENCOUNTER — Encounter: Payer: Self-pay | Admitting: Rheumatology

## 2018-02-10 ENCOUNTER — Ambulatory Visit (INDEPENDENT_AMBULATORY_CARE_PROVIDER_SITE_OTHER): Payer: MEDICARE | Admitting: Rheumatology

## 2018-02-10 VITALS — BP 117/59 | HR 85 | Resp 16 | Ht 61.0 in | Wt 141.0 lb

## 2018-02-10 DIAGNOSIS — Z8679 Personal history of other diseases of the circulatory system: Secondary | ICD-10-CM | POA: Diagnosis not present

## 2018-02-10 DIAGNOSIS — Z79899 Other long term (current) drug therapy: Secondary | ICD-10-CM | POA: Diagnosis not present

## 2018-02-10 DIAGNOSIS — Z8639 Personal history of other endocrine, nutritional and metabolic disease: Secondary | ICD-10-CM | POA: Diagnosis not present

## 2018-02-10 DIAGNOSIS — M5030 Other cervical disc degeneration, unspecified cervical region: Secondary | ICD-10-CM | POA: Diagnosis not present

## 2018-02-10 DIAGNOSIS — M0579 Rheumatoid arthritis with rheumatoid factor of multiple sites without organ or systems involvement: Secondary | ICD-10-CM | POA: Diagnosis not present

## 2018-02-10 DIAGNOSIS — M19041 Primary osteoarthritis, right hand: Secondary | ICD-10-CM

## 2018-02-10 DIAGNOSIS — M8589 Other specified disorders of bone density and structure, multiple sites: Secondary | ICD-10-CM | POA: Diagnosis not present

## 2018-02-10 DIAGNOSIS — N183 Chronic kidney disease, stage 3 (moderate): Secondary | ICD-10-CM

## 2018-02-10 DIAGNOSIS — E785 Hyperlipidemia, unspecified: Secondary | ICD-10-CM | POA: Diagnosis not present

## 2018-02-10 DIAGNOSIS — N1832 Chronic kidney disease, stage 3b: Secondary | ICD-10-CM

## 2018-02-10 DIAGNOSIS — M19042 Primary osteoarthritis, left hand: Secondary | ICD-10-CM | POA: Diagnosis not present

## 2018-02-10 MED ORDER — LEFLUNOMIDE 10 MG PO TABS: 10.0000 mg | ORAL_TABLET | Freq: Every day | ORAL | 0 refills | Status: DC

## 2018-02-10 NOTE — Patient Instructions (Addendum)
Standing Labs We placed an order today for your standing lab work.    Please come back and get your standing labs in 2 weeks, then 2 months, and every 3 months  We have open lab Monday through Friday from 8:30-11:30 AM and 1:30-4:00 PM  at the office of Dr. Bo Merino.   You may experience shorter wait times on Monday and Friday afternoons. The office is located at [address] [place], [place], [place] [address] No appointment is necessary.   Labs are drawn by [facility].  You may receive a bill from [facility] for your lab work. If you have any questions regarding directions or hours of operation,  please call [phone].       Leflunomide tablets What is this medicine? LEFLUNOMIDE (le FLOO na mide) is for rheumatoid arthritis. This medicine may be used for other purposes; ask your health care provider or pharmacist if you have questions. COMMON BRAND NAME(S): Arava What should I tell my health care provider before I take this medicine? They need to know if you have any of these conditions: -alcoholism -bone marrow problems -fever or infection -immune system problems -kidney disease -liver disease -an unusual or allergic reaction to leflunomide, teriflunomide, other medicines, lactose, foods, dyes, or preservatives -pregnant or trying to get pregnant -breast-feeding How should I use this medicine? Take this medicine by mouth with a full glass of water. Follow the directions on the prescription label. Take your medicine at regular intervals. Do not take your medicine more often than directed. Do not stop taking except on your doctor's advice. Talk to your pediatrician regarding the use of this medicine in children. Special care may be needed. Overdosage: If you think you have taken too much of this medicine contact a poison control center or emergency room at once. NOTE: This medicine is only for you. Do not share this medicine with others. What if I miss a dose? If you  miss a dose, take it as soon as you can. If it is almost time for your next dose, take only that dose. Do not take double or extra doses. What may interact with this medicine? Do not take this medicine with any of the following medications: -teriflunomide This medicine may also interact with the following medications: -charcoal -cholestyramine -methotrexate -NSAIDs, medicines for pain and inflammation, like ibuprofen or naproxen -phenytoin -rifampin -tolbutamide -vaccines -warfarin This list may not describe all possible interactions. Give your health care provider a list of all the medicines, herbs, non-prescription drugs, or dietary supplements you use. Also tell them if you smoke, drink alcohol, or use illegal drugs. Some items may interact with your medicine. What should I watch for while using this medicine? Visit your doctor or health care professional for regular checks on your progress. You will need frequent blood checks while you are receiving the medicine. If you get a cold or other infection while receiving this medicine, call your doctor or health care professional. Do not treat yourself. The medicine may increase your risk of getting an infection. If you are a woman who has the potential to become pregnant, discuss birth control options with your doctor or health care professional. Dennis Bast must not be pregnant, and you must be using a reliable form of birth control. The medicine may harm an unborn baby. Immediately call your doctor if you think you might be pregnant. Alcoholic drinks may increase possible damage to your liver. Do not drink alcohol while taking this medicine. What side effects may I  notice from receiving this medicine? Side effects that you should report to your doctor or health care professional as soon as possible: -allergic reactions like skin rash, itching or hives, swelling of the face, lips, or tongue -cough -difficulty breathing or shortness of breath -fever,  chills or any other sign of infection -redness, blistering, peeling or loosening of the skin, including inside the mouth -unusual bleeding or bruising -unusually weak or tired -vomiting -yellowing of eyes or skin Side effects that usually do not require medical attention (report to your doctor or health care professional if they continue or are bothersome): -diarrhea -hair loss -headache -nausea This list may not describe all possible side effects. Call your doctor for medical advice about side effects. You may report side effects to FDA at 1-800-FDA-1088. Where should I keep my medicine? Keep out of the reach of children. Store at room temperature between 15 and 30 degrees C (59 and 86 degrees F). Protect from moisture and light. Throw away any unused medicine after the expiration date. NOTE: This sheet is a summary. It may not cover all possible information. If you have questions about this medicine, talk to your doctor, pharmacist, or health care provider.  2018 Elsevier/Gold Standard (2013-07-19 10:53:11)

## 2018-02-25 ENCOUNTER — Other Ambulatory Visit: Payer: Self-pay

## 2018-02-25 DIAGNOSIS — Z79899 Other long term (current) drug therapy: Secondary | ICD-10-CM

## 2018-02-25 LAB — CBC WITH DIFFERENTIAL/PLATELET
BASOS PCT: 0.7 %
Basophils Absolute: 39 cells/uL (ref 0–200)
Eosinophils Absolute: 123 cells/uL (ref 15–500)
Eosinophils Relative: 2.2 %
HCT: 39.7 % (ref 35.0–45.0)
Hemoglobin: 13 g/dL (ref 11.7–15.5)
Lymphs Abs: 2341 cells/uL (ref 850–3900)
MCH: 26.3 pg — ABNORMAL LOW (ref 27.0–33.0)
MCHC: 32.7 g/dL (ref 32.0–36.0)
MCV: 80.4 fL (ref 80.0–100.0)
MPV: 9.7 fL (ref 7.5–12.5)
Monocytes Relative: 11.5 %
NEUTROS ABS: 2453 {cells}/uL (ref 1500–7800)
Neutrophils Relative %: 43.8 %
PLATELETS: 256 10*3/uL (ref 140–400)
RBC: 4.94 10*6/uL (ref 3.80–5.10)
RDW: 13.9 % (ref 11.0–15.0)
TOTAL LYMPHOCYTE: 41.8 %
WBC: 5.6 10*3/uL (ref 3.8–10.8)
WBCMIX: 644 {cells}/uL (ref 200–950)

## 2018-02-25 LAB — COMPLETE METABOLIC PANEL WITH GFR
AG RATIO: 1.4 (calc) (ref 1.0–2.5)
ALT: 12 U/L (ref 6–29)
AST: 22 U/L (ref 10–35)
Albumin: 4.3 g/dL (ref 3.6–5.1)
Alkaline phosphatase (APISO): 54 U/L (ref 33–130)
BUN/Creatinine Ratio: 25 (calc) — ABNORMAL HIGH (ref 6–22)
BUN: 35 mg/dL — AB (ref 7–25)
CALCIUM: 9.9 mg/dL (ref 8.6–10.4)
CO2: 29 mmol/L (ref 20–32)
Chloride: 101 mmol/L (ref 98–110)
Creat: 1.42 mg/dL — ABNORMAL HIGH (ref 0.60–0.88)
GFR, EST AFRICAN AMERICAN: 40 mL/min/{1.73_m2} — AB (ref >=60)
GFR, EST NON AFRICAN AMERICAN: 35 mL/min/{1.73_m2} — AB (ref >=60)
Globulin: 3 g/dL (calc) (ref 1.9–3.7)
Glucose, Bld: 86 mg/dL (ref 65–99)
Potassium: 5.2 mmol/L (ref 3.5–5.3)
Sodium: 137 mmol/L (ref 135–146)
TOTAL PROTEIN: 7.3 g/dL (ref 6.1–8.1)
Total Bilirubin: 0.5 mg/dL (ref 0.2–1.2)

## 2018-02-26 NOTE — Progress Notes (Signed)
stable °

## 2018-03-05 ENCOUNTER — Other Ambulatory Visit: Payer: Self-pay | Admitting: Physician Assistant

## 2018-03-05 NOTE — Telephone Encounter (Signed)
Last visit: 02/10/2018 Next visit: 07/13/2018 Labs: 02/25/2018 stable   Okay to refill per Dr. Estanislado Pandy.

## 2018-03-25 DIAGNOSIS — M069 Rheumatoid arthritis, unspecified: Secondary | ICD-10-CM | POA: Diagnosis not present

## 2018-03-25 DIAGNOSIS — Z79899 Other long term (current) drug therapy: Secondary | ICD-10-CM | POA: Diagnosis not present

## 2018-04-28 ENCOUNTER — Other Ambulatory Visit: Payer: Self-pay

## 2018-04-28 DIAGNOSIS — Z79899 Other long term (current) drug therapy: Secondary | ICD-10-CM | POA: Diagnosis not present

## 2018-04-29 LAB — CBC WITH DIFFERENTIAL/PLATELET
Basophils Absolute: 39 cells/uL (ref 0–200)
Basophils Relative: 0.6 %
EOS PCT: 3.7 %
Eosinophils Absolute: 241 cells/uL (ref 15–500)
HEMATOCRIT: 38.6 % (ref 35.0–45.0)
HEMOGLOBIN: 12.9 g/dL (ref 11.7–15.5)
LYMPHS ABS: 1879 {cells}/uL (ref 850–3900)
MCH: 27.1 pg (ref 27.0–33.0)
MCHC: 33.4 g/dL (ref 32.0–36.0)
MCV: 81.1 fL (ref 80.0–100.0)
MPV: 9.6 fL (ref 7.5–12.5)
Monocytes Relative: 9.8 %
NEUTROS ABS: 3705 {cells}/uL (ref 1500–7800)
Neutrophils Relative %: 57 %
Platelets: 310 10*3/uL (ref 140–400)
RBC: 4.76 10*6/uL (ref 3.80–5.10)
RDW: 14 % (ref 11.0–15.0)
Total Lymphocyte: 28.9 %
WBC mixed population: 637 cells/uL (ref 200–950)
WBC: 6.5 10*3/uL (ref 3.8–10.8)

## 2018-04-29 LAB — COMPLETE METABOLIC PANEL WITH GFR
AG Ratio: 1.3 (calc) (ref 1.0–2.5)
ALBUMIN MSPROF: 4.1 g/dL (ref 3.6–5.1)
ALKALINE PHOSPHATASE (APISO): 55 U/L (ref 33–130)
ALT: 10 U/L (ref 6–29)
AST: 24 U/L (ref 10–35)
BILIRUBIN TOTAL: 0.5 mg/dL (ref 0.2–1.2)
BUN / CREAT RATIO: 16 (calc) (ref 6–22)
BUN: 24 mg/dL (ref 7–25)
CHLORIDE: 101 mmol/L (ref 98–110)
CO2: 29 mmol/L (ref 20–32)
CREATININE: 1.5 mg/dL — AB (ref 0.60–0.88)
Calcium: 11.1 mg/dL — ABNORMAL HIGH (ref 8.6–10.4)
GFR, Est African American: 37 mL/min/{1.73_m2} — ABNORMAL LOW (ref >=60)
GFR, Est Non African American: 32 mL/min/{1.73_m2} — ABNORMAL LOW (ref >=60)
GLUCOSE: 93 mg/dL (ref 65–99)
Globulin: 3.1 g/dL (calc) (ref 1.9–3.7)
Potassium: 4.7 mmol/L (ref 3.5–5.3)
Sodium: 140 mmol/L (ref 135–146)
Total Protein: 7.2 g/dL (ref 6.1–8.1)

## 2018-04-29 NOTE — Progress Notes (Signed)
Labs are stable.

## 2018-06-07 ENCOUNTER — Other Ambulatory Visit: Payer: Self-pay | Admitting: Internal Medicine

## 2018-06-07 NOTE — Telephone Encounter (Signed)
Left message for pt to return phone call

## 2018-06-29 NOTE — Progress Notes (Signed)
Office Visit Note  Patient: [name] [name]             Date of Birth: [birth year]           MRN: [id number]             PCP: Marletta Lor, MD Referring: Marletta Lor, MD Visit Date: 07/13/2018 Occupation: @GUAROCC @  Subjective:  Medication management    History of Present Illness: [name] [name] is a 83 y.o. female with history of seropositive rheumatoid arthritis, DDD and osteoarthritis.    Patient denies any joint pain or joint swelling.  She does have some underlying osteoarthritis which causes stiffness.  She is not having much discomfort from her cervical spine currently.  Activities of Daily Living:  Patient reports morning stiffness for 0 minutes.   Patient Denies nocturnal pain.  Difficulty dressing/grooming: Denies Difficulty climbing stairs: Denies Difficulty getting out of chair: Denies Difficulty using hands for taps, buttons, cutlery, and/or writing: Denies  Review of Systems  Constitutional: Negative for fatigue.  HENT: Negative for mouth sores, trouble swallowing, trouble swallowing and mouth dryness.   Eyes: Negative for pain, redness, itching and dryness.  Respiratory: Negative for shortness of breath, wheezing and difficulty breathing.   Cardiovascular: Negative for chest pain, palpitations and swelling in legs/feet.  Gastrointestinal: Negative for abdominal pain, blood in stool, constipation and diarrhea.  Endocrine: Negative for increased urination.  Genitourinary: Negative for painful urination and pelvic pain.  Musculoskeletal: Negative for arthralgias, joint pain, joint swelling and morning stiffness.  Skin: Negative for rash and hair loss.  Allergic/Immunologic: Negative for susceptible to infections.  Neurological: Positive for memory loss. Negative for dizziness, light-headedness, headaches and weakness.  Hematological: Positive for bruising/bleeding tendency.  Psychiatric/Behavioral: Negative for confusion. The patient is not  nervous/anxious.     PMFS History:  Patient Active Problem List   Diagnosis Date Noted  . DDD (degenerative disc disease), cervical 07/13/2018  . Chronic kidney disease (CKD) stage G3b 01/21/2017  . History of hypothyroidism 01/20/2017  . History of congestive heart failure 08/26/2016  . Osteopenia of multiple sites 08/25/2016  . DDD cervical spine 08/25/2016  . Primary osteoarthritis of both hands 08/25/2016  . High risk medication use 06/11/2016  . CHF, acute ([place]) 02/01/2013  . 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 Hypothyroidism 01/19/2007  . Dyslipidemia 01/19/2007  . Essential hypertension 01/19/2007  . Rheumatoid arthritis ([place]) 01/19/2007    Past Medical History:  Diagnosis Date  . HYPERLIPIDEMIA 01/19/2007  . HYPERTENSION 01/19/2007  . HYPOTHYROIDISM 01/19/2007  . Rheumatoid arthritis(714.0) 01/19/2007    Family History  Problem Relation Age of Onset  . Diabetes Father   . Heart disease Father   . Cancer Sister        breast   . Heart attack Brother   . Heart attack Brother   . Heart attack Brother   . Heart disease Sister    Past Surgical History:  Procedure Laterality Date  . ABDOMINAL HYSTERECTOMY    . APPENDECTOMY    . CHOLECYSTECTOMY    . NECK SURGERY     Social History   Social History Narrative  . Not on file   Immunization History  Administered Date(s) Administered  . Influenza,inj,Quad PF,6+ Mos 07/06/2013  . Pneumococcal Conjugate-13 07/06/2013  . Pneumococcal Polysaccharide-23 04/09/2011, 06/21/2012  . Tdap 04/09/2011   Objective: Vital Signs: BP 126/69 (BP Location: Left Arm, Patient Position: Sitting, Cuff Size: Normal)   Pulse 75   Resp 12   Ht  5\' 1"  (1.549 m)   Wt 141 lb 3.2 oz (64 kg)   BMI 26.68 kg/m    Physical Exam  Constitutional: She is oriented to person, place, and time. She appears well-developed and well-nourished.  HENT:  Head: Normocephalic and atraumatic.  Eyes: Conjunctivae and EOM are normal.  Neck: Normal range of  motion.  Cardiovascular: Normal rate, regular rhythm, normal heart sounds and intact distal pulses.  Pulmonary/Chest: Effort normal and breath sounds normal.  Abdominal: Soft. Bowel sounds are normal.  Lymphadenopathy:    She has no cervical adenopathy.  Neurological: She is alert and oriented to person, place, and time.  Skin: Skin is warm and dry. Capillary refill takes less than 2 seconds.  Psychiatric: She has a normal mood and affect. Her behavior is normal.  Nursing note and vitals reviewed.    Musculoskeletal Exam: Spine thoracic lumbar spine good range of motion.  Shoulder joints elbow joints wrist joint MCPs PIPs DIPs been good range of motion with no synovitis.  Hip joints knee joints ankles MTPs PIPs been good range of motion with no synovitis.  CDAI Exam: CDAI Score: 0.2  Patient Global Assessment: 1 (mm); Provider Global Assessment: 1 (mm) Swollen: 0 ; Tender: 0  Joint Exam   Not documented   There is currently no information documented on the homunculus. Go to the Rheumatology activity and complete the homunculus joint exam.  Investigation: No additional findings.  Imaging: No results found.  Recent Labs: Lab Results  Component Value Date   WBC 6.5 04/28/2018   HGB 12.9 04/28/2018   PLT 310 04/28/2018   NA 140 04/28/2018   K 4.7 04/28/2018   CL 101 04/28/2018   CO2 29 04/28/2018   GLUCOSE 93 04/28/2018   BUN 24 04/28/2018   CREATININE 1.50 (H) 04/28/2018   BILITOT 0.5 04/28/2018   ALKPHOS 49 01/22/2017   AST 24 04/28/2018   ALT 10 04/28/2018   PROT 7.2 04/28/2018   ALBUMIN 3.8 01/22/2017   CALCIUM 11.1 (H) 04/28/2018   GFRAA 37 (L) 04/28/2018    Speciality Comments: PLQ Eye Exam: 03/25/18 WNL @ [facility] Follow up in 6 months  Procedures:  No procedures performed Allergies: Tramadol and Codeine phosphate   Assessment / Plan:     Visit Diagnoses: Rheumatoid arthritis involving multiple sites with positive rheumatoid factor (HCC)-patient  had no synovitis on examination.  Her rheumatoid arthritis seems to be quite well controlled with Arava and Plaquenil combination.  High risk medication use - Current regimen includes Arava 10 mg daily and Plaquenil 200 mg daily.  Last Plaquenil eye exam normal on 03/25/2018.  Most recent CBC/CMP stable on 04/28/2018.  Next CBC/CMP due at the end of the month and then every 3 months.  Standing orders are in place.  Recommend annual flu and Shingrix vaccines as indicated.  X-ray of bilateral hands and feet taken on 02/10/2018. - Plan: CBC with Differential/Platelet, COMPLETE METABOLIC PANEL WITH GFR  Primary osteoarthritis of both hands-currently not causing much discomfort.  Osteopenia of multiple sites-her last bone density was in 2015.  She needs a repeat bone density.  I have advised her to schedule DEXA through her PCP as it was done at breast center and I would not be able to order there.  DDD (degenerative disc disease), cervical-currently not having any discomfort.  Chronic kidney disease (CKD) stage G3b- her GFR is in 30s.  Other medical problems are listed as follows:  Dyslipidemia  History of congestive heart failure  History of hypertension-blood pressure is controlled.  History of hypothyroidism   Orders: Orders Placed This Encounter  Procedures  . CBC with Differential/Platelet  . COMPLETE METABOLIC PANEL WITH GFR   No orders of the defined types were placed in this encounter.     Follow-Up Instructions: Return in about 5 months (around 12/12/2018) for Rheumatoid arthritis.   Bo Merino, MD  Note - This record has been created using Editor, commissioning.  Chart creation errors have been sought, but may not always  have been located. Such creation errors do not reflect on  the standard of medical care.

## 2018-07-13 ENCOUNTER — Encounter: Payer: Self-pay | Admitting: Rheumatology

## 2018-07-13 ENCOUNTER — Ambulatory Visit: Payer: Medicare HMO | Admitting: Rheumatology

## 2018-07-13 VITALS — BP 126/69 | HR 75 | Resp 12 | Ht 61.0 in | Wt 141.2 lb

## 2018-07-13 DIAGNOSIS — E785 Hyperlipidemia, unspecified: Secondary | ICD-10-CM

## 2018-07-13 DIAGNOSIS — Z8639 Personal history of other endocrine, nutritional and metabolic disease: Secondary | ICD-10-CM

## 2018-07-13 DIAGNOSIS — Z79899 Other long term (current) drug therapy: Secondary | ICD-10-CM | POA: Diagnosis not present

## 2018-07-13 DIAGNOSIS — M0579 Rheumatoid arthritis with rheumatoid factor of multiple sites without organ or systems involvement: Secondary | ICD-10-CM | POA: Diagnosis not present

## 2018-07-13 DIAGNOSIS — M19042 Primary osteoarthritis, left hand: Secondary | ICD-10-CM

## 2018-07-13 DIAGNOSIS — Z8679 Personal history of other diseases of the circulatory system: Secondary | ICD-10-CM

## 2018-07-13 DIAGNOSIS — N1832 Chronic kidney disease, stage 3b: Secondary | ICD-10-CM

## 2018-07-13 DIAGNOSIS — M8589 Other specified disorders of bone density and structure, multiple sites: Secondary | ICD-10-CM

## 2018-07-13 DIAGNOSIS — M19041 Primary osteoarthritis, right hand: Secondary | ICD-10-CM | POA: Diagnosis not present

## 2018-07-13 DIAGNOSIS — N183 Chronic kidney disease, stage 3 (moderate): Secondary | ICD-10-CM

## 2018-07-13 DIAGNOSIS — M5030 Other cervical disc degeneration, unspecified cervical region: Secondary | ICD-10-CM

## 2018-07-13 NOTE — Patient Instructions (Signed)
Standing Labs We placed an order today for your standing lab work.    Please come back and get your standing labs in March and every 3 months  We have open lab Monday through Friday from 8:30-11:30 AM and 1:30-4:00 PM  at the office of Dr. Rikki Trosper.   You may experience shorter wait times on Monday and Friday afternoons. The office is located at [address], [address], [place], [place] [address] No appointment is necessary.   Labs are drawn by [facility].  You may receive a bill from [facility] for your lab work. If you have any questions regarding directions or hours of operation,  please call [phone].   Just as a reminder please drink plenty of water prior to coming for your lab work. Thanks!  

## 2018-07-14 LAB — CBC WITH DIFFERENTIAL/PLATELET
BASOS PCT: 0.7 %
Basophils Absolute: 42 cells/uL (ref 0–200)
EOS ABS: 138 {cells}/uL (ref 15–500)
Eosinophils Relative: 2.3 %
HEMATOCRIT: 36.9 % (ref 35.0–45.0)
HEMOGLOBIN: 12 g/dL (ref 11.7–15.5)
Lymphs Abs: 2280 cells/uL (ref 850–3900)
MCH: 27 pg (ref 27.0–33.0)
MCHC: 32.5 g/dL (ref 32.0–36.0)
MCV: 82.9 fL (ref 80.0–100.0)
MPV: 9.8 fL (ref 7.5–12.5)
Monocytes Relative: 8.9 %
NEUTROS ABS: 3006 {cells}/uL (ref 1500–7800)
Neutrophils Relative %: 50.1 %
Platelets: 264 10*3/uL (ref 140–400)
RBC: 4.45 10*6/uL (ref 3.80–5.10)
RDW: 13.2 % (ref 11.0–15.0)
Total Lymphocyte: 38 %
WBC: 6 10*3/uL (ref 3.8–10.8)
WBCMIX: 534 {cells}/uL (ref 200–950)

## 2018-07-14 LAB — COMPLETE METABOLIC PANEL WITH GFR
AG Ratio: 1.3 (calc) (ref 1.0–2.5)
ALBUMIN MSPROF: 3.9 g/dL (ref 3.6–5.1)
ALT: 11 U/L (ref 6–29)
AST: 24 U/L (ref 10–35)
Alkaline phosphatase (APISO): 50 U/L (ref 33–130)
BILIRUBIN TOTAL: 0.4 mg/dL (ref 0.2–1.2)
BUN / CREAT RATIO: 18 (calc) (ref 6–22)
BUN: 24 mg/dL (ref 7–25)
CALCIUM: 10.1 mg/dL (ref 8.6–10.4)
CHLORIDE: 106 mmol/L (ref 98–110)
CO2: 29 mmol/L (ref 20–32)
CREATININE: 1.35 mg/dL — AB (ref 0.60–0.88)
GFR, Est African American: 43 mL/min/{1.73_m2} — ABNORMAL LOW (ref >=60)
GFR, Est Non African American: 37 mL/min/{1.73_m2} — ABNORMAL LOW (ref >=60)
GLOBULIN: 3.1 g/dL (ref 1.9–3.7)
GLUCOSE: 94 mg/dL (ref 65–99)
Potassium: 4.9 mmol/L (ref 3.5–5.3)
Sodium: 142 mmol/L (ref 135–146)
Total Protein: 7 g/dL (ref 6.1–8.1)

## 2018-07-14 NOTE — Progress Notes (Signed)
Labs are stable.

## 2018-09-28 ENCOUNTER — Encounter: Payer: Self-pay | Admitting: Internal Medicine

## 2018-09-28 DIAGNOSIS — H04123 Dry eye syndrome of bilateral lacrimal glands: Secondary | ICD-10-CM | POA: Diagnosis not present

## 2018-09-28 DIAGNOSIS — Z961 Presence of intraocular lens: Secondary | ICD-10-CM | POA: Diagnosis not present

## 2018-09-28 DIAGNOSIS — Z79899 Other long term (current) drug therapy: Secondary | ICD-10-CM | POA: Diagnosis not present

## 2018-09-28 DIAGNOSIS — M069 Rheumatoid arthritis, unspecified: Secondary | ICD-10-CM | POA: Diagnosis not present

## 2018-10-02 ENCOUNTER — Other Ambulatory Visit: Payer: Self-pay | Admitting: Rheumatology

## 2018-10-04 NOTE — Telephone Encounter (Signed)
Last Visit: 02/10/18 Next visit: 12/14/18 Labs: 07/13/18 Stable PLQ Eye Exam: 03/25/18 WNL   Okay to refill per Dr. Estanislado Pandy

## 2018-12-14 ENCOUNTER — Ambulatory Visit: Payer: Self-pay | Admitting: Rheumatology

## 2018-12-29 ENCOUNTER — Other Ambulatory Visit: Payer: Self-pay | Admitting: Rheumatology

## 2018-12-29 NOTE — Telephone Encounter (Signed)
Last Visit: 07/13/18 Next visit: 01/27/19 Labs: 07/13/18 Stable PLQ Eye Exam: 03/25/18 WNL   Okay to refill per Dr. Estanislado Pandy

## 2019-01-18 NOTE — Progress Notes (Deleted)
Office Visit Note  Patient: [name] [name]             Date of Birth: 1935/01/10           MRN: [id number]             PCP: Marletta Lor, MD Referring: Marletta Lor, MD Visit Date: 01/27/2019 Occupation: @GUAROCC @  Subjective:  No chief complaint on file.  Arava 10 mg 1 tablet daily and Plaquenil 200 mg 1 tablet daily.  Last Plaquenil eye exam normal on 09/28/2018.  Most recent CBC/CMP stable on 07/13/2018.  She is overdue for labs.  CBC/CMP ordered for today will monitor every 3 months.  Standing orders placed.  She is received both Prevnar 13 and Pneumovax 23.  Recommend annual flu and Shingrix vaccines.  History of Present Illness: [name] [name] is a 84 y.o. female ***   Activities of Daily Living:  Patient reports morning stiffness for *** {minute/hour:19697}.   Patient {ACTIONS;DENIES/REPORTS:21021675::"Denies"} nocturnal pain.  Difficulty dressing/grooming: {ACTIONS;DENIES/REPORTS:21021675::"Denies"} Difficulty climbing stairs: {ACTIONS;DENIES/REPORTS:21021675::"Denies"} Difficulty getting out of chair: {ACTIONS;DENIES/REPORTS:21021675::"Denies"} Difficulty using hands for taps, buttons, cutlery, and/or writing: {ACTIONS;DENIES/REPORTS:21021675::"Denies"}  No Rheumatology ROS completed.   PMFS History:  Patient Active Problem List   Diagnosis Date Noted  . DDD (degenerative disc disease), cervical 07/13/2018  . Chronic kidney disease (CKD) stage G3b 01/21/2017  . History of hypothyroidism 01/20/2017  . History of congestive heart failure 08/26/2016  . Osteopenia of multiple sites 08/25/2016  . DDD cervical spine 08/25/2016  . Primary osteoarthritis of both hands 08/25/2016  . High risk medication use 06/11/2016  . CHF, acute ([place]) 02/01/2013  . 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 Hypothyroidism 01/19/2007  . Dyslipidemia 01/19/2007  . Essential hypertension 01/19/2007  . Rheumatoid arthritis ([place]) 01/19/2007    Past Medical History:  Diagnosis  Date  . HYPERLIPIDEMIA 01/19/2007  . HYPERTENSION 01/19/2007  . HYPOTHYROIDISM 01/19/2007  . Rheumatoid arthritis(714.0) 01/19/2007    Family History  Problem Relation Age of Onset  . Diabetes Father   . Heart disease Father   . Cancer Sister        breast   . Heart attack Brother   . Heart attack Brother   . Heart attack Brother   . Heart disease Sister    Past Surgical History:  Procedure Laterality Date  . ABDOMINAL HYSTERECTOMY    . APPENDECTOMY    . CHOLECYSTECTOMY    . NECK SURGERY     Social History   Social History Narrative  . Not on file   Immunization History  Administered Date(s) Administered  . Influenza,inj,Quad PF,6+ Mos 07/06/2013  . Pneumococcal Conjugate-13 07/06/2013  . Pneumococcal Polysaccharide-23 04/09/2011, 06/21/2012  . Tdap 04/09/2011     Objective: Vital Signs: There were no vitals taken for this visit.   Physical Exam   Musculoskeletal Exam: ***  CDAI Exam: CDAI Score: - Patient Global: -; Provider Global: - Swollen: -; Tender: - Joint Exam   No joint exam has been documented for this visit   There is currently no information documented on the homunculus. Go to the Rheumatology activity and complete the homunculus joint exam.  Investigation: No additional findings.  Imaging: No results found.  Recent Labs: Lab Results  Component Value Date   WBC 6.0 07/13/2018   HGB 12.0 07/13/2018   PLT 264 07/13/2018   NA 142 07/13/2018   K 4.9 07/13/2018   CL 106 07/13/2018   CO2 29 07/13/2018   GLUCOSE 94 07/13/2018  BUN 24 07/13/2018   CREATININE 1.35 (H) 07/13/2018   BILITOT 0.4 07/13/2018   ALKPHOS 49 01/22/2017   AST 24 07/13/2018   ALT 11 07/13/2018   PROT 7.0 07/13/2018   ALBUMIN 3.8 01/22/2017   CALCIUM 10.1 07/13/2018   GFRAA 43 (L) 07/13/2018    Speciality Comments: PLQ Eye Exam: 09/28/18 WNL @ [facility] Follow up in 6 months  Procedures:  No procedures performed Allergies: Tramadol and Codeine  phosphate   Assessment / Plan:     Visit Diagnoses: Rheumatoid arthritis involving multiple sites with positive rheumatoid factor ([place])  High risk medication use  Primary osteoarthritis of both hands  Osteopenia of multiple sites  DDD (degenerative disc disease), cervical  Chronic kidney disease (CKD) stage G3b  Dyslipidemia  History of congestive heart failure  History of hypertension  History of hypothyroidism   Orders: No orders of the defined types were placed in this encounter.  No orders of the defined types were placed in this encounter.   Face-to-face time spent with patient was *** minutes. Greater than 50% of time was spent in counseling and coordination of care.  Follow-Up Instructions: No follow-ups on file.   Ofilia Neas, PA-C  Note - This record has been created using Dragon software.  Chart creation errors have been sought, but may not always  have been located. Such creation errors do not reflect on  the standard of medical care.

## 2019-01-26 DIAGNOSIS — M069 Rheumatoid arthritis, unspecified: Secondary | ICD-10-CM | POA: Diagnosis not present

## 2019-01-26 DIAGNOSIS — N183 Chronic kidney disease, stage 3 (moderate): Secondary | ICD-10-CM | POA: Diagnosis not present

## 2019-01-26 DIAGNOSIS — E039 Hypothyroidism, unspecified: Secondary | ICD-10-CM | POA: Diagnosis not present

## 2019-01-26 DIAGNOSIS — I129 Hypertensive chronic kidney disease with stage 1 through stage 4 chronic kidney disease, or unspecified chronic kidney disease: Secondary | ICD-10-CM | POA: Diagnosis not present

## 2019-01-26 DIAGNOSIS — I509 Heart failure, unspecified: Secondary | ICD-10-CM | POA: Diagnosis not present

## 2019-01-27 ENCOUNTER — Ambulatory Visit: Payer: Medicare HMO | Admitting: Rheumatology

## 2019-02-03 ENCOUNTER — Encounter: Payer: Self-pay | Admitting: Family Medicine

## 2019-02-03 ENCOUNTER — Ambulatory Visit (INDEPENDENT_AMBULATORY_CARE_PROVIDER_SITE_OTHER): Payer: Medicare HMO | Admitting: Family Medicine

## 2019-02-03 VITALS — BP 118/76 | HR 81 | Ht 61.0 in | Wt 141.5 lb

## 2019-02-03 DIAGNOSIS — I10 Essential (primary) hypertension: Secondary | ICD-10-CM | POA: Diagnosis not present

## 2019-02-03 DIAGNOSIS — I129 Hypertensive chronic kidney disease with stage 1 through stage 4 chronic kidney disease, or unspecified chronic kidney disease: Secondary | ICD-10-CM

## 2019-02-03 DIAGNOSIS — N1832 Chronic kidney disease, stage 3b: Secondary | ICD-10-CM

## 2019-02-03 DIAGNOSIS — E039 Hypothyroidism, unspecified: Secondary | ICD-10-CM | POA: Diagnosis not present

## 2019-02-03 DIAGNOSIS — E7800 Pure hypercholesterolemia, unspecified: Secondary | ICD-10-CM | POA: Diagnosis not present

## 2019-02-03 DIAGNOSIS — N183 Chronic kidney disease, stage 3 (moderate): Secondary | ICD-10-CM

## 2019-02-03 NOTE — Progress Notes (Signed)
Established Patient Office Visit  Subjective:  Patient ID: [name] [name], female    DOB: 26-Nov-1934  Age: 84 y.o. MRN: [id number]  CC:  Chief Complaint  Patient presents with  . Establish Care    HPI [name] [name] presents for establishment of care by way of transfer.  She is here to follow-up on her hypertension, hypothyroidism, chronic kidney disease and elevated cholesterol.  Currently seeing rheumatology for rheumatoid arthritis.  Blood pressures well controlled with Lotensin HCT.  She has had no issues taking this medicine.  She takes her Synthroid in the morning on a fasting stomach.  Cholesterol has been elevated in the past.  It has been treated in the past but medicine was discontinued.  She does not smoke drink alcohol or use illicit drugs.  She labs alone.  She has 1 son who lives relatively close by.  She is fasting today.  She lives in a community for seniors only.  She walks alone almost daily since.  Past Medical History:  Diagnosis Date  . HYPERLIPIDEMIA 01/19/2007  . HYPERTENSION 01/19/2007  . HYPOTHYROIDISM 01/19/2007  . Rheumatoid arthritis(714.0) 01/19/2007    Past Surgical History:  Procedure Laterality Date  . ABDOMINAL HYSTERECTOMY    . APPENDECTOMY    . CHOLECYSTECTOMY    . NECK SURGERY      Family History  Problem Relation Age of Onset  . Diabetes Father   . Heart disease Father   . Cancer Sister        breast   . Heart attack Brother   . Heart attack Brother   . Heart attack Brother   . Heart disease Sister     Social History   Socioeconomic History  . Marital status: Married    Spouse name: Not on file  . Number of children: Not on file  . Years of education: Not on file  . Highest education level: Not on file  Occupational History  . Not on file  Social Needs  . Financial resource strain: Not on file  . Food insecurity    Worry: Not on file    Inability: Not on file  . Transportation needs    Medical: Not on file   Non-medical: Not on file  Tobacco Use  . Smoking status: Never Smoker  . Smokeless tobacco: Never Used  Substance and Sexual Activity  . Alcohol use: No  . Drug use: No  . Sexual activity: Not on file  Lifestyle  . Physical activity    Days per week: Not on file    Minutes per session: Not on file  . Stress: Not on file  Relationships  . Social Herbalist on phone: Not on file    Gets together: Not on file    Attends religious service: Not on file    Active member of club or organization: Not on file    Attends meetings of clubs or organizations: Not on file    Relationship status: Not on file  . Intimate partner violence    Fear of current or ex partner: Not on file    Emotionally abused: Not on file    Physically abused: Not on file    Forced sexual activity: Not on file  Other Topics Concern  . Not on file  Social History Narrative  . Not on file    Outpatient Medications Prior to Visit  Medication Sig Dispense Refill  . benazepril-hydrochlorthiazide (LOTENSIN HCT) 10-12.5 MG tablet TAKE  1 TABLET BY MOUTH EVERY DAY 90 tablet 3  . hydroxychloroquine (PLAQUENIL) 200 MG tablet TAKE 1 TABLET BY MOUTH EVERY DAY 90 tablet 0  . leflunomide (ARAVA) 10 MG tablet TAKE 1 TABLET BY MOUTH EVERY DAY 30 tablet 0  . levothyroxine (SYNTHROID, LEVOTHROID) 75 MCG tablet TAKE 1 TABLET (75 MCG TOTAL) BY MOUTH DAILY. 90 tablet 3  . Multiple Vitamins-Minerals (MULTIVITAMIN WITH MINERALS) tablet Take 1 tablet by mouth daily.    Marland Kitchen OVER THE COUNTER MEDICATION Place 1 drop into both eyes daily. "hyper tear"    . Polyethyl Glycol-Propyl Glycol (SYSTANE OP) Apply to eye daily.     No facility-administered medications prior to visit.     Allergies  Allergen Reactions  . Tramadol Nausea Only  . Codeine Phosphate Nausea Only    REACTION: unspecified    ROS Review of Systems  Constitutional: Negative for diaphoresis, fatigue, fever and unexpected weight change.  HENT: Negative.    Eyes: Negative for photophobia and visual disturbance.  Respiratory: Negative.   Cardiovascular: Negative.   Gastrointestinal: Negative.   Endocrine: Negative for polyphagia and polyuria.  Genitourinary: Negative.   Musculoskeletal: Negative for gait problem and joint swelling.  Skin: Negative for pallor and rash.  Allergic/Immunologic: Negative for immunocompromised state.  Neurological: Negative for light-headedness and numbness.  Hematological: Does not bruise/bleed easily.  Psychiatric/Behavioral: Negative.       Objective:    Physical Exam  Constitutional: She is oriented to person, place, and time. She appears well-developed and well-nourished. No distress.  HENT:  Head: Normocephalic and atraumatic.  Right Ear: External ear normal.  Left Ear: External ear normal.  Mouth/Throat: Oropharynx is clear and moist. No oropharyngeal exudate.  Eyes: Pupils are equal, round, and reactive to light. Conjunctivae are normal. Right eye exhibits no discharge. Left eye exhibits no discharge. No scleral icterus.  Neck: Neck supple. No JVD present. No tracheal deviation present. No thyromegaly present.  Cardiovascular: Normal rate, regular rhythm and normal heart sounds.  Pulmonary/Chest: Effort normal and breath sounds normal. No stridor.  Abdominal: Bowel sounds are normal.  Musculoskeletal:        General: No edema.  Lymphadenopathy:    She has no cervical adenopathy.  Neurological: She is alert and oriented to person, place, and time.  Skin: Skin is warm and dry. She is not diaphoretic.  Psychiatric: She has a normal mood and affect. Her behavior is normal.    BP 118/76   Pulse 81   Ht 5\' 1"  (1.549 m)   Wt 141 lb 8 oz (64.2 kg)   SpO2 97%   BMI 26.74 kg/m  Wt Readings from Last 3 Encounters:  02/03/19 141 lb 8 oz (64.2 kg)  07/13/18 141 lb 3.2 oz (64 kg)  02/10/18 141 lb (64 kg)   BP Readings from Last 3 Encounters:  02/03/19 118/76  07/13/18 126/69  02/10/18 (!) 117/59    Guideline developer:  UpToDate (see UpToDate for funding source) Date Released: June 2014  Health Maintenance Due  Topic Date Due  . MAMMOGRAM  07/23/2016    There are no preventive care reminders to display for this patient.  Lab Results  Component Value Date   TSH 2.58 11/26/2017   Lab Results  Component Value Date   WBC 6.0 07/13/2018   HGB 12.0 07/13/2018   HCT 36.9 07/13/2018   MCV 82.9 07/13/2018   PLT 264 07/13/2018   Lab Results  Component Value Date   NA 142 07/13/2018   K  4.9 07/13/2018   CO2 29 07/13/2018   GLUCOSE 94 07/13/2018   BUN 24 07/13/2018   CREATININE 1.35 (H) 07/13/2018   BILITOT 0.4 07/13/2018   ALKPHOS 49 01/22/2017   AST 24 07/13/2018   ALT 11 07/13/2018   PROT 7.0 07/13/2018   ALBUMIN 3.8 01/22/2017   CALCIUM 10.1 07/13/2018   GFR 54.36 (L) 10/22/2015   Lab Results  Component Value Date   CHOL 278 (H) 11/13/2016   Lab Results  Component Value Date   HDL 73.40 11/13/2016   Lab Results  Component Value Date   LDLCALC 167 (H) 11/13/2016   Lab Results  Component Value Date   TRIG 185.0 (H) 11/13/2016   Lab Results  Component Value Date   CHOLHDL 4 11/13/2016   Lab Results  Component Value Date   HGBA1C 5.9 (H) 01/31/2013      Assessment & Plan:   Problem List Items Addressed This Visit      Cardiovascular and Mediastinum   Essential hypertension   Relevant Orders   Comprehensive metabolic panel   CBC   Microalbumin / creatinine urine ratio   Urinalysis, Routine w reflex microscopic   Microalbumin / creatinine urine ratio     Endocrine   Hypothyroidism   Relevant Orders   TSH     Genitourinary   Chronic kidney disease (CKD) stage G3b - Primary   Relevant Orders   Comprehensive metabolic panel   Microalbumin / creatinine urine ratio   Microalbumin / creatinine urine ratio    Other Visit Diagnoses    Elevated LDL cholesterol level       Relevant Orders   LDL cholesterol, direct   Lipid panel       No orders of the defined types were placed in this encounter.   Follow-up: Return in about 3 months (around 05/06/2019).   Labs are pending.  Will consider restarting cholesterol medicine.  Follow-up will be in 3 months.

## 2019-02-04 LAB — LDL CHOLESTEROL, DIRECT: LDL Direct: 161 mg/dL — ABNORMAL HIGH (ref 0–99)

## 2019-02-04 LAB — COMPREHENSIVE METABOLIC PANEL
ALT: 16 IU/L (ref 0–32)
AST: 35 IU/L (ref 0–40)
Albumin/Globulin Ratio: 1.6 (ref 1.2–2.2)
Albumin: 4.7 g/dL — ABNORMAL HIGH (ref 3.6–4.6)
Alkaline Phosphatase: 52 IU/L (ref 39–117)
BUN/Creatinine Ratio: 21 (ref 12–28)
BUN: 31 mg/dL — ABNORMAL HIGH (ref 8–27)
Bilirubin Total: 0.4 mg/dL (ref 0.0–1.2)
CO2: 23 mmol/L (ref 20–29)
Calcium: 10.2 mg/dL (ref 8.7–10.3)
Chloride: 100 mmol/L (ref 96–106)
Creatinine, Ser: 1.49 mg/dL — ABNORMAL HIGH (ref 0.57–1.00)
GFR calc Af Amer: 37 mL/min/{1.73_m2} — ABNORMAL LOW (ref >59)
GFR calc non Af Amer: 32 mL/min/{1.73_m2} — ABNORMAL LOW (ref >59)
Globulin, Total: 3 g/dL (ref 1.5–4.5)
Glucose: 98 mg/dL (ref 65–99)
Potassium: 5.1 mmol/L (ref 3.5–5.2)
Sodium: 141 mmol/L (ref 134–144)
Total Protein: 7.7 g/dL (ref 6.0–8.5)

## 2019-02-04 LAB — LIPID PANEL
Chol/HDL Ratio: 3 ratio (ref 0.0–4.4)
Cholesterol, Total: 281 mg/dL — ABNORMAL HIGH (ref 100–199)
HDL: 93 mg/dL (ref >39)
LDL Calculated: 156 mg/dL — ABNORMAL HIGH (ref 0–99)
Triglycerides: 161 mg/dL — ABNORMAL HIGH (ref 0–149)
VLDL Cholesterol Cal: 32 mg/dL (ref 5–40)

## 2019-02-04 LAB — CBC
Hematocrit: 37.7 % (ref 34.0–46.6)
Hemoglobin: 12.4 g/dL (ref 11.1–15.9)
MCH: 26.1 pg — ABNORMAL LOW (ref 26.6–33.0)
MCHC: 32.9 g/dL (ref 31.5–35.7)
MCV: 79 fL (ref 79–97)
Platelets: 284 10*3/uL (ref 150–450)
RBC: 4.75 x10E6/uL (ref 3.77–5.28)
RDW: 14.3 % (ref 11.7–15.4)
WBC: 6.2 10*3/uL (ref 3.4–10.8)

## 2019-02-04 LAB — TSH: TSH: 18.69 u[IU]/mL — ABNORMAL HIGH (ref 0.450–4.500)

## 2019-02-11 ENCOUNTER — Ambulatory Visit (INDEPENDENT_AMBULATORY_CARE_PROVIDER_SITE_OTHER): Payer: Medicare HMO | Admitting: Family Medicine

## 2019-02-11 ENCOUNTER — Encounter: Payer: Self-pay | Admitting: Family Medicine

## 2019-02-11 DIAGNOSIS — I129 Hypertensive chronic kidney disease with stage 1 through stage 4 chronic kidney disease, or unspecified chronic kidney disease: Secondary | ICD-10-CM

## 2019-02-11 DIAGNOSIS — I10 Essential (primary) hypertension: Secondary | ICD-10-CM

## 2019-02-11 DIAGNOSIS — E7800 Pure hypercholesterolemia, unspecified: Secondary | ICD-10-CM | POA: Diagnosis not present

## 2019-02-11 DIAGNOSIS — E039 Hypothyroidism, unspecified: Secondary | ICD-10-CM

## 2019-02-11 DIAGNOSIS — N183 Chronic kidney disease, stage 3 (moderate): Secondary | ICD-10-CM

## 2019-02-11 DIAGNOSIS — N1832 Chronic kidney disease, stage 3b: Secondary | ICD-10-CM

## 2019-02-11 MED ORDER — BENAZEPRIL-HYDROCHLOROTHIAZIDE 10-12.5 MG PO TABS: 1.0000 | ORAL_TABLET | Freq: Every day | ORAL | 3 refills | Status: DC

## 2019-02-11 MED ORDER — LEVOTHYROXINE SODIUM 75 MCG PO TABS: 75.0000 ug | ORAL_TABLET | Freq: Every day | ORAL | 3 refills | Status: DC

## 2019-02-11 NOTE — Progress Notes (Signed)
Established Patient Office Visit  Subjective:  Patient ID: [name] [name], female    DOB: 1935/03/06  Age: 84 y.o. MRN: [id number]  CC:  Chief Complaint  Patient presents with  . Follow-up    HPI [facility] presents for follow-up of her blood work.  TSH is markedly elevated.  Patient admits that she has been of her Synthroid for couple of months and taking an over-the-counter thyroid hormone.  She takes her Lotensin daily without issue.  Her LDL cholesterol is elevated to 154 but she has a highly favorable HDL at 93.  She has no known cardiovascular or arterial sclerotic disease.  Chronic kidney disease has been stable.  She knows that she needs to take her thyroid medicine on a fasting stomach an hour before eating   Past Medical History:  Diagnosis Date  . HYPERLIPIDEMIA 01/19/2007  . HYPERTENSION 01/19/2007  . HYPOTHYROIDISM 01/19/2007  . Rheumatoid arthritis(714.0) 01/19/2007    Past Surgical History:  Procedure Laterality Date  . ABDOMINAL HYSTERECTOMY    . APPENDECTOMY    . CHOLECYSTECTOMY    . NECK SURGERY      Family History  Problem Relation Age of Onset  . Diabetes Father   . Heart disease Father   . Cancer Sister        breast   . Heart attack Brother   . Heart attack Brother   . Heart attack Brother   . Heart disease Sister     Social History   Socioeconomic History  . Marital status: Married    Spouse name: Not on file  . Number of children: Not on file  . Years of education: Not on file  . Highest education level: Not on file  Occupational History  . Not on file  Social Needs  . Financial resource strain: Not on file  . Food insecurity    Worry: Not on file    Inability: Not on file  . Transportation needs    Medical: Not on file    Non-medical: Not on file  Tobacco Use  . Smoking status: Never Smoker  . Smokeless tobacco: Never Used  Substance and Sexual Activity  . Alcohol use: No  . Drug use: No  . Sexual activity: Not on file   Lifestyle  . Physical activity    Days per week: Not on file    Minutes per session: Not on file  . Stress: Not on file  Relationships  . Social Herbalist on phone: Not on file    Gets together: Not on file    Attends religious service: Not on file    Active member of club or organization: Not on file    Attends meetings of clubs or organizations: Not on file    Relationship status: Not on file  . Intimate partner violence    Fear of current or ex partner: Not on file    Emotionally abused: Not on file    Physically abused: Not on file    Forced sexual activity: Not on file  Other Topics Concern  . Not on file  Social History Narrative  . Not on file    Outpatient Medications Prior to Visit  Medication Sig Dispense Refill  . hydroxychloroquine (PLAQUENIL) 200 MG tablet TAKE 1 TABLET BY MOUTH EVERY DAY 90 tablet 0  . leflunomide (ARAVA) 10 MG tablet TAKE 1 TABLET BY MOUTH EVERY DAY 30 tablet 0  . Multiple Vitamins-Minerals (MULTIVITAMIN WITH MINERALS)  tablet Take 1 tablet by mouth daily.    Marland Kitchen OVER THE COUNTER MEDICATION Place 1 drop into both eyes daily. "hyper tear"    . Polyethyl Glycol-Propyl Glycol (SYSTANE OP) Apply to eye daily.    . benazepril-hydrochlorthiazide (LOTENSIN HCT) 10-12.5 MG tablet TAKE 1 TABLET BY MOUTH EVERY DAY 90 tablet 3  . levothyroxine (SYNTHROID, LEVOTHROID) 75 MCG tablet TAKE 1 TABLET (75 MCG TOTAL) BY MOUTH DAILY. 90 tablet 3   No facility-administered medications prior to visit.     Allergies  Allergen Reactions  . Tramadol Nausea Only  . Codeine Phosphate Nausea Only    REACTION: unspecified    ROS Review of Systems  Constitutional: Negative.   Respiratory: Negative.   Cardiovascular: Negative.   Gastrointestinal: Negative.   Endocrine: Negative for cold intolerance and heat intolerance.  Hematological: Negative.   Psychiatric/Behavioral: Negative.       Objective:    Physical Exam  Constitutional: She is oriented  to person, place, and time. No distress.  Pulmonary/Chest: Effort normal.  Neurological: She is alert and oriented to person, place, and time.  Psychiatric: She has a normal mood and affect. Her behavior is normal. Thought content normal.    There were no vitals taken for this visit. Wt Readings from Last 3 Encounters:  02/03/19 141 lb 8 oz (64.2 kg)  07/13/18 141 lb 3.2 oz (64 kg)  02/10/18 141 lb (64 kg)   BP Readings from Last 3 Encounters:  02/03/19 118/76  07/13/18 126/69  02/10/18 (!) 117/59   Guideline developer:  UpToDate (see UpToDate for funding source) Date Released: June 2014  Health Maintenance Due  Topic Date Due  . MAMMOGRAM  07/23/2016    There are no preventive care reminders to display for this patient.  Lab Results  Component Value Date   TSH 18.690 (H) 02/03/2019   Lab Results  Component Value Date   WBC 6.2 02/03/2019   HGB 12.4 02/03/2019   HCT 37.7 02/03/2019   MCV 79 02/03/2019   PLT 284 02/03/2019   Lab Results  Component Value Date   NA 141 02/03/2019   K 5.1 02/03/2019   CO2 23 02/03/2019   GLUCOSE 98 02/03/2019   BUN 31 (H) 02/03/2019   CREATININE 1.49 (H) 02/03/2019   BILITOT 0.4 02/03/2019   ALKPHOS 52 02/03/2019   AST 35 02/03/2019   ALT 16 02/03/2019   PROT 7.7 02/03/2019   ALBUMIN 4.7 (H) 02/03/2019   CALCIUM 10.2 02/03/2019   GFR 54.36 (L) 10/22/2015   Lab Results  Component Value Date   CHOL 281 (H) 02/03/2019   Lab Results  Component Value Date   HDL 93 02/03/2019   Lab Results  Component Value Date   LDLCALC 156 (H) 02/03/2019   Lab Results  Component Value Date   TRIG 161 (H) 02/03/2019   Lab Results  Component Value Date   CHOLHDL 3.0 02/03/2019   Lab Results  Component Value Date   HGBA1C 5.9 (H) 01/31/2013      Assessment & Plan:   Problem List Items Addressed This Visit      Cardiovascular and Mediastinum   Essential hypertension   Relevant Medications   benazepril-hydrochlorthiazide  (LOTENSIN HCT) 10-12.5 MG tablet     Endocrine   Hypothyroidism - Primary   Relevant Medications   levothyroxine (SYNTHROID) 75 MCG tablet     Genitourinary   Chronic kidney disease (CKD) stage G3b    Other Visit Diagnoses    Elevated LDL  cholesterol level          Meds ordered this encounter  Medications  . levothyroxine (SYNTHROID) 75 MCG tablet    Sig: Take 1 tablet (75 mcg total) by mouth daily.    Dispense:  90 tablet    Refill:  3  . benazepril-hydrochlorthiazide (LOTENSIN HCT) 10-12.5 MG tablet    Sig: Take 1 tablet by mouth daily.    Dispense:  90 tablet    Refill:  3    Follow-up: Return Will return in October..   Virtual Visit via Video Note  I connected with Lillia Pauls Bowlby on 02/11/19 at 11:00 AM EDT by a video enabled telemedicine application and verified that I am speaking with the correct person using two identifiers.  Location: Patient: [address]  Provider: Case as well as log of blood sugars have been outlined in   I discussed the limitations of evaluation and management by telemedicine and the availability of in person appointments. The patient expressed understanding and agreed to proceed.  History of Present Illness:    Observations/Objective:   Assessment and Plan:   Follow Up Instructions:    I discussed the assessment and treatment plan with the patient. The patient was provided an opportunity to ask questions and all were answered. The patient agreed with the plan and demonstrated an understanding of the instructions.   The patient was advised to call back or seek an in-person evaluation if the symptoms worsen or if the condition fails to improve as anticipated.  I provided 12 minutes of non-face-to-face time during this encounter.   Libby Maw, MD  Patient will restart her Synthroid take as directed.  She has a scheduled appointment in October to see me.

## 2019-02-15 LAB — URINALYSIS, ROUTINE W REFLEX MICROSCOPIC
Bacteria, UA: NONE SEEN /HPF
Bilirubin Urine: NEGATIVE
Glucose, UA: NEGATIVE
Hgb urine dipstick: NEGATIVE
Hyaline Cast: NONE SEEN /LPF
Ketones, ur: NEGATIVE
Nitrite: NEGATIVE
Protein, ur: NEGATIVE
Specific Gravity, Urine: 1.021 (ref 1.001–1.03)
Squamous Epithelial / LPF: NONE SEEN /HPF (ref <=5)
pH: <=5 (ref 5.0–8.0)

## 2019-02-15 LAB — MICROALBUMIN / CREATININE URINE RATIO
Creatinine, Urine: 271 mg/dL (ref 20–275)
Microalb Creat Ratio: 2 mcg/mg creat (ref <30)
Microalb, Ur: 0.6 mg/dL

## 2019-03-27 ENCOUNTER — Other Ambulatory Visit: Payer: Self-pay | Admitting: Rheumatology

## 2019-03-27 DIAGNOSIS — M0579 Rheumatoid arthritis with rheumatoid factor of multiple sites without organ or systems involvement: Secondary | ICD-10-CM

## 2019-03-28 MED ORDER — LEFLUNOMIDE 10 MG PO TABS: 10.0000 mg | ORAL_TABLET | Freq: Every day | ORAL | 0 refills | Status: DC

## 2019-03-28 NOTE — Telephone Encounter (Signed)
Reviewed with Dr. Estanislado Pandy.  She would like the patient to discontinue PLQ due to low GFR.  She can continue taking Arava 10 mg po daily.  Please advise patient to schedule follow up visit.

## 2019-03-28 NOTE — Telephone Encounter (Signed)
Patient advised to discontinue the PLQ. Patient advised to continue the Arava 10 mg daily. Prescription sent in for the [place].

## 2019-03-28 NOTE — Telephone Encounter (Signed)
Please schedule patient for a follow up visit. Patient was due May 2020. Thanks! 

## 2019-03-28 NOTE — Telephone Encounter (Signed)
Last Visit: 07/13/18 Next Visit: Due May 2020. Message sent to the front to schedule patient.  Labs: 02/21/19 MCH 26.1 BUN 31, Creat. 1.49 GFR 32, Albumin 4.7 Eye exam: 09/28/18 WNL  Okay to refill Plaquenil?

## 2019-03-28 NOTE — Progress Notes (Signed)
Office Visit Note  Patient: [name] [name]             Date of Birth: 1934-08-18           MRN: [id number]             PCP: Libby Maw, MD Referring: Libby Maw,* Visit Date: 03/31/2019 Occupation: @GUAROCC @  Subjective:  Medication monitoring   History of Present Illness: [name] [name] is a 84 y.o. female with history of seropositive rheumatoid arthritis, osteoarthritis, and DDD.  Patient is taking Arava 10 mg by mouth daily.  She has had any recent infections.  She was advised to discontinue Plaquenil due to low GFR.  She denies any recent flares.  She has no joint pain or joint swelling at this time.  She denies any neck pain or radiculopathy.  She has been walking on her treadmill daily.  She continues a calcium and vitamin D.   Activities of Daily Living:  Patient reports morning stiffness for 0 minutes.   Patient Denies nocturnal pain.  Difficulty dressing/grooming: Denies Difficulty climbing stairs: Denies Difficulty getting out of chair: Denies Difficulty using hands for taps, buttons, cutlery, and/or writing: Denies  Review of Systems  Constitutional: Negative for fatigue.  HENT: Negative for mouth sores, trouble swallowing, trouble swallowing, mouth dryness and nose dryness.   Eyes: Negative for itching and dryness.  Respiratory: Negative for shortness of breath, wheezing and difficulty breathing.   Cardiovascular: Negative for chest pain, palpitations and swelling in legs/feet.  Gastrointestinal: Negative for abdominal pain, blood in stool, constipation and diarrhea.  Endocrine: Negative for increased urination.  Genitourinary: Negative for difficulty urinating and painful urination.  Musculoskeletal: Negative for arthralgias, joint pain, joint swelling and morning stiffness.  Skin: Negative for rash and redness.  Allergic/Immunologic: Negative for susceptible to infections.  Neurological: Negative for dizziness, light-headedness,  headaches, memory loss and weakness.  Hematological: Negative for bruising/bleeding tendency.  Psychiatric/Behavioral: Negative for confusion and sleep disturbance.    PMFS History:  Patient Active Problem List   Diagnosis Date Noted  . DDD (degenerative disc disease), cervical 07/13/2018  . Chronic kidney disease (CKD) stage G3b 01/21/2017  . History of hypothyroidism 01/20/2017  . History of congestive heart failure 08/26/2016  . Osteopenia of multiple sites 08/25/2016  . DDD cervical spine 08/25/2016  . Primary osteoarthritis of both hands 08/25/2016  . High risk medication use 06/11/2016  . CHF, acute ([place]) 02/01/2013  . 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 Hypothyroidism 01/19/2007  . Dyslipidemia 01/19/2007  . Essential hypertension 01/19/2007  . Rheumatoid arthritis ([place]) 01/19/2007    Past Medical History:  Diagnosis Date  . HYPERLIPIDEMIA 01/19/2007  . HYPERTENSION 01/19/2007  . HYPOTHYROIDISM 01/19/2007  . Rheumatoid arthritis(714.0) 01/19/2007    Family History  Problem Relation Age of Onset  . Diabetes Father   . Heart disease Father   . Cancer Sister        breast   . Heart attack Brother   . Heart attack Brother   . Heart attack Brother   . Heart disease Sister    Past Surgical History:  Procedure Laterality Date  . ABDOMINAL HYSTERECTOMY    . APPENDECTOMY    . CHOLECYSTECTOMY    . NECK SURGERY     Social History   Social History Narrative  . Not on file   Immunization History  Administered Date(s) Administered  . Influenza,inj,Quad PF,6+ Mos 07/06/2013  . Pneumococcal Conjugate-13 07/06/2013  . Pneumococcal Polysaccharide-23 04/09/2011, 06/21/2012  .  Tdap 04/09/2011     Objective: Vital Signs: BP 119/70 (BP Location: Left Arm, Patient Position: Sitting, Cuff Size: Normal)   Pulse 80   Resp 14   Ht 5' 1.5" (1.562 m)   Wt 136 lb 4.8 oz (61.8 kg)   BMI 25.34 kg/m    Physical Exam Vitals signs and nursing note reviewed.  Constitutional:       Appearance: She is well-developed.  HENT:     Head: Normocephalic and atraumatic.  Eyes:     Conjunctiva/sclera: Conjunctivae normal.  Neck:     Musculoskeletal: Normal range of motion.  Cardiovascular:     Rate and Rhythm: Normal rate and regular rhythm.     Heart sounds: Normal heart sounds.  Pulmonary:     Effort: Pulmonary effort is normal.     Breath sounds: Normal breath sounds.  Abdominal:     General: Bowel sounds are normal.     Palpations: Abdomen is soft.  Lymphadenopathy:     Cervical: No cervical adenopathy.  Skin:    General: Skin is warm and dry.     Capillary Refill: Capillary refill takes less than 2 seconds.  Neurological:     Mental Status: She is alert and oriented to person, place, and time.  Psychiatric:        Behavior: Behavior normal.      Musculoskeletal Exam: C-spine segments range of motion.  Thoracic lumbar sacral range of motion.  No midline spinal tenderness.  No SI joint tenderness.  Shoulder and skin over his chemistries, CBC and PIPs of DIPs have good range of motion.  She has tenderness and synovitis of the right third MCP and left second MCP joint.  Hip joints, knee joints, ankle joints, MTPs, PIPs, DIPs good range of motion no synovitis.  No warmth or effusion of knee joints.  Pedal edema bilaterally.  No tenderness of ankle joints noted.   CDAI Exam: CDAI Score: 4.4  Patient Global: 2 mm; Provider Global: 2 mm Swollen: 2 ; Tender: 2  Joint Exam      Right  Left  MCP 2     Swollen Tender  MCP 3  Swollen Tender        Investigation: No additional findings.  Imaging: No results found.  Recent Labs: Lab Results  Component Value Date   WBC 6.2 02/03/2019   HGB 12.4 02/03/2019   PLT 284 02/03/2019   NA 141 02/03/2019   K 5.1 02/03/2019   CL 100 02/03/2019   CO2 23 02/03/2019   GLUCOSE 98 02/03/2019   BUN 31 (H) 02/03/2019   CREATININE 1.49 (H) 02/03/2019   BILITOT 0.4 02/03/2019   ALKPHOS 52 02/03/2019   AST 35 02/03/2019    ALT 16 02/03/2019   PROT 7.7 02/03/2019   ALBUMIN 4.7 (H) 02/03/2019   CALCIUM 10.2 02/03/2019   GFRAA 37 (L) 02/03/2019    Speciality Comments: PLQ Eye Exam: 09/28/18 WNL @ [facility] Follow up in 6 months Prior therapy: Methotrexate (lung injury)   Procedures:  No procedures performed Allergies: Tramadol and Codeine phosphate   Assessment / Plan:     Visit Diagnoses: Rheumatoid arthritis involving multiple sites with positive rheumatoid factor ([place]): She has synovitis of the right third MCP and left second MCP joint.  She is on Arava 10 mg by mouth daily monotherapy.  She was advised to discontinue Plaquenil due to low GFR on 03/28/19.  We discussed increasing the dose of Arava to 20 mg by mouth daily  since she continues to have active synovitis.  She will return for lab work in 2 weeks to monitor for drug toxicity.  She advised notify us if she cannot tolerate the higher dose.  She is advised also advised if she develops increasing pain or joint swelling.  She will follow-up in the office in 5 months.  High risk medication use -CBC and CMP were drawn on 02/03/2019.  Creatinine was 1.49 and GFR was 32 at that time.  She was advised to discontinue Plaquenil on 03/28/2019 due to elevated creatinine and low GFR.  She will be increasing the dose of Arava to 20 mg by mouth daily.  She will return for lab work in 2 weeks to monitor for drug toxicity.  Standing orders were placed today.  Primary osteoarthritis of both hands: She has synovial thickening consistent with osteoarthritis of bilateral hands.  She has no tenderness on exam.  Joint protection and muscle strengthening discussed  Osteopenia of multiple sites - Her last bone density was in 2015.  She has been taking calcium and vitamin D.  A future order for DEXA was placed today.- Plan: DG BONE DENSITY (DXA)  DDD (degenerative disc disease), cervical: She has limited ROM with no discomfort.  She does not have any symptoms of  radiculopathy.    Other medical conditions are listed as follows:  Dyslipidemia  Chronic kidney disease (CKD) stage G3b  History of hypertension  History of hypothyroidism  History of congestive heart failure  Orders: Orders Placed This Encounter  Procedures  . DG BONE DENSITY (DXA)  . CBC with Differential/Platelet  . COMPLETE METABOLIC PANEL WITH GFR   Meds ordered this encounter  Medications  . leflunomide (ARAVA) 20 MG tablet    Sig: Take 1 tablet (20 mg total) by mouth daily.    Dispense:  90 tablet    Refill:  0    Dose change    Follow-Up Instructions: Return in about 5 months (around 08/31/2019) for Rheumatoid arthritis, Osteoarthritis.   Ofilia Neas, PA-C  Note - This record has been created using Dragon software.  Chart creation errors have been sought, but may not always  have been located. Such creation errors do not reflect on  the standard of medical care.

## 2019-03-29 DIAGNOSIS — Z79899 Other long term (current) drug therapy: Secondary | ICD-10-CM | POA: Diagnosis not present

## 2019-03-29 DIAGNOSIS — M069 Rheumatoid arthritis, unspecified: Secondary | ICD-10-CM | POA: Diagnosis not present

## 2019-03-31 ENCOUNTER — Encounter: Payer: Self-pay | Admitting: Physician Assistant

## 2019-03-31 ENCOUNTER — Other Ambulatory Visit: Payer: Self-pay

## 2019-03-31 ENCOUNTER — Ambulatory Visit: Payer: Medicare HMO | Admitting: Physician Assistant

## 2019-03-31 VITALS — BP 119/70 | HR 80 | Resp 14 | Ht 61.5 in | Wt 136.3 lb

## 2019-03-31 DIAGNOSIS — Z8639 Personal history of other endocrine, nutritional and metabolic disease: Secondary | ICD-10-CM | POA: Diagnosis not present

## 2019-03-31 DIAGNOSIS — M0579 Rheumatoid arthritis with rheumatoid factor of multiple sites without organ or systems involvement: Secondary | ICD-10-CM

## 2019-03-31 DIAGNOSIS — Z79899 Other long term (current) drug therapy: Secondary | ICD-10-CM

## 2019-03-31 DIAGNOSIS — M19042 Primary osteoarthritis, left hand: Secondary | ICD-10-CM | POA: Diagnosis not present

## 2019-03-31 DIAGNOSIS — M8589 Other specified disorders of bone density and structure, multiple sites: Secondary | ICD-10-CM | POA: Diagnosis not present

## 2019-03-31 DIAGNOSIS — E785 Hyperlipidemia, unspecified: Secondary | ICD-10-CM | POA: Diagnosis not present

## 2019-03-31 DIAGNOSIS — Z8679 Personal history of other diseases of the circulatory system: Secondary | ICD-10-CM | POA: Diagnosis not present

## 2019-03-31 DIAGNOSIS — M19041 Primary osteoarthritis, right hand: Secondary | ICD-10-CM

## 2019-03-31 DIAGNOSIS — M5030 Other cervical disc degeneration, unspecified cervical region: Secondary | ICD-10-CM | POA: Diagnosis not present

## 2019-03-31 DIAGNOSIS — N183 Chronic kidney disease, stage 3 (moderate): Secondary | ICD-10-CM | POA: Diagnosis not present

## 2019-03-31 DIAGNOSIS — N1832 Chronic kidney disease, stage 3b: Secondary | ICD-10-CM

## 2019-03-31 MED ORDER — LEFLUNOMIDE 20 MG PO TABS: 20.0000 mg | ORAL_TABLET | Freq: Every day | ORAL | 0 refills | Status: DC

## 2019-03-31 NOTE — Patient Instructions (Signed)
Standing Labs We placed an order today for your standing lab work.    Please come back and get your standing labs in 2 weeks  We have open lab daily Monday through Thursday from 8:30-12:30 PM and 1:30-4:30 PM and Friday from 8:30-12:30 PM and 1:30 -4:00 PM at the office of Dr. Shaili Deveshwar.   You may experience shorter wait times on Monday and Friday afternoons. The office is located at [address], [address], [place], [place] [address] No appointment is necessary.   Labs are drawn by [facility].  You may receive a bill from [facility] for your lab work.  If you wish to have your labs drawn at another location, please call the office 24 hours in advance to send orders.  If you have any questions regarding directions or hours of operation,  please call [phone].   Just as a reminder please drink plenty of water prior to coming for your lab work. Thanks!   

## 2019-05-05 ENCOUNTER — Other Ambulatory Visit: Payer: Self-pay

## 2019-05-06 ENCOUNTER — Encounter: Payer: Self-pay | Admitting: Family Medicine

## 2019-05-06 ENCOUNTER — Ambulatory Visit (INDEPENDENT_AMBULATORY_CARE_PROVIDER_SITE_OTHER): Payer: Medicare HMO | Admitting: Family Medicine

## 2019-05-06 VITALS — BP 122/70 | HR 89 | Ht 61.5 in | Wt 140.8 lb

## 2019-05-06 DIAGNOSIS — I10 Essential (primary) hypertension: Secondary | ICD-10-CM

## 2019-05-06 DIAGNOSIS — Z23 Encounter for immunization: Secondary | ICD-10-CM | POA: Diagnosis not present

## 2019-05-06 DIAGNOSIS — N1832 Chronic kidney disease, stage 3b: Secondary | ICD-10-CM | POA: Diagnosis not present

## 2019-05-06 DIAGNOSIS — E039 Hypothyroidism, unspecified: Secondary | ICD-10-CM | POA: Diagnosis not present

## 2019-05-06 LAB — BASIC METABOLIC PANEL
BUN: 29 mg/dL — ABNORMAL HIGH (ref 6–23)
CO2: 28 mEq/L (ref 19–32)
Calcium: 10.7 mg/dL — ABNORMAL HIGH (ref 8.4–10.5)
Chloride: 102 mEq/L (ref 96–112)
Creatinine, Ser: 1.45 mg/dL — ABNORMAL HIGH (ref 0.40–1.20)
GFR: 34.54 mL/min — ABNORMAL LOW (ref >60.00)
Glucose, Bld: 103 mg/dL — ABNORMAL HIGH (ref 70–99)
Potassium: 4.5 mEq/L (ref 3.5–5.1)
Sodium: 140 mEq/L (ref 135–145)

## 2019-05-06 LAB — TSH: TSH: 2.09 u[IU]/mL (ref 0.35–4.50)

## 2019-05-06 MED ORDER — BENAZEPRIL HCL 10 MG PO TABS: 10.0000 mg | ORAL_TABLET | Freq: Every day | ORAL | 0 refills | Status: DC

## 2019-05-06 NOTE — Progress Notes (Signed)
Established Patient Office Visit  Subjective:  Patient ID: [name] [name], female    DOB: 1934/09/08  Age: 84 y.o. MRN: [id number]  CC:  Chief Complaint  Patient presents with  . Follow-up    HPI [facility] presents for follow-up of her chronic kidney disease, hypertension, hypothyroidism and swelling in her lower extremities.  Assures me that she has been taking her levothyroxine every morning an hour before eating.  She has been hydrating well.  She is aware of the effect that this can have on maintaining her kidney function.  She actually did not take any of her medicines this morning.  She was able to see the ophthalmologist this year.  She is on the callback list for her dentist.  She continues to live independently.  Past Medical History:  Diagnosis Date  . HYPERLIPIDEMIA 01/19/2007  . HYPERTENSION 01/19/2007  . HYPOTHYROIDISM 01/19/2007  . Rheumatoid arthritis(714.0) 01/19/2007    Past Surgical History:  Procedure Laterality Date  . ABDOMINAL HYSTERECTOMY    . APPENDECTOMY    . CHOLECYSTECTOMY    . NECK SURGERY      Family History  Problem Relation Age of Onset  . Diabetes Father   . Heart disease Father   . Cancer Sister        breast   . Heart attack Brother   . Heart attack Brother   . Heart attack Brother   . Heart disease Sister     Social History   Socioeconomic History  . Marital status: Married    Spouse name: Not on file  . Number of children: Not on file  . Years of education: Not on file  . Highest education level: Not on file  Occupational History  . Not on file  Social Needs  . Financial resource strain: Not on file  . Food insecurity    Worry: Not on file    Inability: Not on file  . Transportation needs    Medical: Not on file    Non-medical: Not on file  Tobacco Use  . Smoking status: Never Smoker  . Smokeless tobacco: Never Used  Substance and Sexual Activity  . Alcohol use: No  . Drug use: No  . Sexual activity: Not on  file  Lifestyle  . Physical activity    Days per week: Not on file    Minutes per session: Not on file  . Stress: Not on file  Relationships  . Social Herbalist on phone: Not on file    Gets together: Not on file    Attends religious service: Not on file    Active member of club or organization: Not on file    Attends meetings of clubs or organizations: Not on file    Relationship status: Not on file  . Intimate partner violence    Fear of current or ex partner: Not on file    Emotionally abused: Not on file    Physically abused: Not on file    Forced sexual activity: Not on file  Other Topics Concern  . Not on file  Social History Narrative  . Not on file    Outpatient Medications Prior to Visit  Medication Sig Dispense Refill  . benazepril-hydrochlorthiazide (LOTENSIN HCT) 10-12.5 MG tablet Take 1 tablet by mouth daily. 90 tablet 3  . leflunomide (ARAVA) 20 MG tablet Take 1 tablet (20 mg total) by mouth daily. 90 tablet 0  . levothyroxine (SYNTHROID) 75 MCG tablet  Take 1 tablet (75 mcg total) by mouth daily. 90 tablet 3  . Multiple Vitamins-Minerals (MULTIVITAMIN WITH MINERALS) tablet Take 1 tablet by mouth daily.    Vladimir Faster Glycol-Propyl Glycol (SYSTANE OP) Apply to eye daily.     No facility-administered medications prior to visit.     Allergies  Allergen Reactions  . Tramadol Nausea Only  . Codeine Phosphate Nausea Only    REACTION: unspecified    ROS Review of Systems  Constitutional: Negative.   HENT: Negative.   Eyes: Negative for photophobia and visual disturbance.  Respiratory: Negative.   Cardiovascular: Negative.   Gastrointestinal: Negative.   Endocrine: Negative for polyphagia and polyuria.  Genitourinary: Negative.   Musculoskeletal: Negative for gait problem and joint swelling.  Skin: Negative for pallor and rash.  Neurological: Negative for headaches.  Hematological: Does not bruise/bleed easily.  Psychiatric/Behavioral:  Negative.       Objective:    Physical Exam  Constitutional: She is oriented to person, place, and time. She appears well-developed and well-nourished. No distress.  HENT:  Head: Normocephalic and atraumatic.  Right Ear: External ear normal.  Left Ear: External ear normal.  Mouth/Throat: Oropharynx is clear and moist. No oropharyngeal exudate.  Eyes: Pupils are equal, round, and reactive to light. Conjunctivae are normal. Right eye exhibits no discharge. Left eye exhibits no discharge. No scleral icterus.  Neck: No JVD present. No tracheal deviation present. No thyromegaly present.  Cardiovascular: Normal rate, regular rhythm and normal heart sounds.  Pulmonary/Chest: Effort normal and breath sounds normal. No stridor.  Musculoskeletal:        General: Edema (there is swelling in the lower extremities with little edema) present.  Lymphadenopathy:    She has no cervical adenopathy.  Neurological: She is alert and oriented to person, place, and time.  Skin: Skin is warm and dry. She is not diaphoretic.  Psychiatric: She has a normal mood and affect. Her behavior is normal.    BP 122/70   Pulse 89   Ht 5' 1.5" (1.562 m)   Wt 140 lb 12.8 oz (63.9 kg)   SpO2 96%   BMI 26.17 kg/m  Wt Readings from Last 3 Encounters:  05/06/19 140 lb 12.8 oz (63.9 kg)  03/31/19 136 lb 4.8 oz (61.8 kg)  02/03/19 141 lb 8 oz (64.2 kg)   BP Readings from Last 3 Encounters:  05/06/19 122/70  03/31/19 119/70  02/03/19 118/76   Guideline developer:  UpToDate (see UpToDate for funding source) Date Released: June 2014  Health Maintenance Due  Topic Date Due  . MAMMOGRAM  07/23/2016    There are no preventive care reminders to display for this patient.  Lab Results  Component Value Date   TSH 18.690 (H) 02/03/2019   Lab Results  Component Value Date   WBC 6.2 02/03/2019   HGB 12.4 02/03/2019   HCT 37.7 02/03/2019   MCV 79 02/03/2019   PLT 284 02/03/2019   Lab Results  Component Value  Date   NA 141 02/03/2019   K 5.1 02/03/2019   CO2 23 02/03/2019   GLUCOSE 98 02/03/2019   BUN 31 (H) 02/03/2019   CREATININE 1.49 (H) 02/03/2019   BILITOT 0.4 02/03/2019   ALKPHOS 52 02/03/2019   AST 35 02/03/2019   ALT 16 02/03/2019   PROT 7.7 02/03/2019   ALBUMIN 4.7 (H) 02/03/2019   CALCIUM 10.2 02/03/2019   GFR 54.36 (L) 10/22/2015   Lab Results  Component Value Date   CHOL 281 (H)  02/03/2019   Lab Results  Component Value Date   HDL 93 02/03/2019   Lab Results  Component Value Date   LDLCALC 156 (H) 02/03/2019   Lab Results  Component Value Date   TRIG 161 (H) 02/03/2019   Lab Results  Component Value Date   CHOLHDL 3.0 02/03/2019   Lab Results  Component Value Date   HGBA1C 5.9 (H) 01/31/2013      Assessment & Plan:   Problem List Items Addressed This Visit      Cardiovascular and Mediastinum   Essential hypertension   Relevant Medications   benazepril (LOTENSIN) 10 MG tablet     Endocrine   Hypothyroidism - Primary   Relevant Orders   TSH     Genitourinary   Chronic kidney disease (CKD) stage G3b   Relevant Medications   benazepril (LOTENSIN) 10 MG tablet   Other Relevant Orders   Basic metabolic panel    Other Visit Diagnoses    Need for influenza vaccination       Relevant Orders   Flu Vaccine QUAD High Dose(Fluad) (Completed)      Meds ordered this encounter  Medications  . benazepril (LOTENSIN) 10 MG tablet    Sig: Take 1 tablet (10 mg total) by mouth daily.    Dispense:  90 tablet    Refill:  0    Follow-up: Return in about 1 month (around 06/06/2019).   I have discontinued the benazepril HCTZ in favor of Benzapril alone.  Patient will follow-up in a month for recheck.  Contemplating discontinuing the Benzapril as well pending her blood pressure pressure next visit. [facility] are not called

## 2019-06-03 ENCOUNTER — Other Ambulatory Visit: Payer: Self-pay

## 2019-06-06 ENCOUNTER — Other Ambulatory Visit: Payer: Self-pay

## 2019-06-06 ENCOUNTER — Encounter: Payer: Self-pay | Admitting: Family Medicine

## 2019-06-06 ENCOUNTER — Ambulatory Visit (INDEPENDENT_AMBULATORY_CARE_PROVIDER_SITE_OTHER): Payer: Medicare HMO | Admitting: Family Medicine

## 2019-06-06 VITALS — BP 120/70 | HR 86 | Ht 61.5 in | Wt 142.0 lb

## 2019-06-06 DIAGNOSIS — I10 Essential (primary) hypertension: Secondary | ICD-10-CM | POA: Diagnosis not present

## 2019-06-06 DIAGNOSIS — N1832 Chronic kidney disease, stage 3b: Secondary | ICD-10-CM | POA: Diagnosis not present

## 2019-06-06 DIAGNOSIS — E039 Hypothyroidism, unspecified: Secondary | ICD-10-CM | POA: Diagnosis not present

## 2019-06-06 LAB — BASIC METABOLIC PANEL
BUN: 23 mg/dL (ref 6–23)
CO2: 28 mEq/L (ref 19–32)
Calcium: 10.8 mg/dL — ABNORMAL HIGH (ref 8.4–10.5)
Chloride: 100 mEq/L (ref 96–112)
Creatinine, Ser: 1.18 mg/dL (ref 0.40–1.20)
GFR: 43.8 mL/min — ABNORMAL LOW (ref >60.00)
Glucose, Bld: 93 mg/dL (ref 70–99)
Potassium: 4.5 mEq/L (ref 3.5–5.1)
Sodium: 138 mEq/L (ref 135–145)

## 2019-06-06 NOTE — Addendum Note (Signed)
Addended by: Jon Billings on: 06/06/2019 02:58 PM   Modules accepted: Orders

## 2019-06-06 NOTE — Progress Notes (Addendum)
Established Patient Office Visit  Subjective:  Patient ID: [name] [name], female    DOB: Sep 28, 1934  Age: 84 y.o. MRN: [id number]  CC:  Chief Complaint  Patient presents with  . Follow-up    HPI [facility] presents for follow-up of her blood pressure and CKD.  Patient is taking Lotensin 10 mg daily.  Patient continues to hydrate well.  She continues to take her thyroid pill as directed in the morning on a fasting stomach.  She continues to shelter at home.  She does not smoke drink alcohol or use illicit drugs.  She is a devout person.  Past Medical History:  Diagnosis Date  . HYPERLIPIDEMIA 01/19/2007  . HYPERTENSION 01/19/2007  . HYPOTHYROIDISM 01/19/2007  . Rheumatoid arthritis(714.0) 01/19/2007    Past Surgical History:  Procedure Laterality Date  . ABDOMINAL HYSTERECTOMY    . APPENDECTOMY    . CHOLECYSTECTOMY    . NECK SURGERY      Family History  Problem Relation Age of Onset  . Diabetes Father   . Heart disease Father   . Cancer Sister        breast   . Heart attack Brother   . Heart attack Brother   . Heart attack Brother   . Heart disease Sister     Social History   Socioeconomic History  . Marital status: Married    Spouse name: Not on file  . Number of children: Not on file  . Years of education: Not on file  . Highest education level: Not on file  Occupational History  . Not on file  Social Needs  . Financial resource strain: Not on file  . Food insecurity    Worry: Not on file    Inability: Not on file  . Transportation needs    Medical: Not on file    Non-medical: Not on file  Tobacco Use  . Smoking status: Never Smoker  . Smokeless tobacco: Never Used  Substance and Sexual Activity  . Alcohol use: No  . Drug use: No  . Sexual activity: Not on file  Lifestyle  . Physical activity    Days per week: Not on file    Minutes per session: Not on file  . Stress: Not on file  Relationships  . Social Herbalist on phone:  Not on file    Gets together: Not on file    Attends religious service: Not on file    Active member of club or organization: Not on file    Attends meetings of clubs or organizations: Not on file    Relationship status: Not on file  . Intimate partner violence    Fear of current or ex partner: Not on file    Emotionally abused: Not on file    Physically abused: Not on file    Forced sexual activity: Not on file  Other Topics Concern  . Not on file  Social History Narrative  . Not on file    Outpatient Medications Prior to Visit  Medication Sig Dispense Refill  . benazepril (LOTENSIN) 10 MG tablet Take 1 tablet (10 mg total) by mouth daily. 90 tablet 0  . leflunomide (ARAVA) 20 MG tablet Take 1 tablet (20 mg total) by mouth daily. 90 tablet 0  . levothyroxine (SYNTHROID) 75 MCG tablet Take 1 tablet (75 mcg total) by mouth daily. 90 tablet 3  . Multiple Vitamins-Minerals (MULTIVITAMIN WITH MINERALS) tablet Take 1 tablet by mouth daily.    Marland Kitchen  Polyethyl Glycol-Propyl Glycol (SYSTANE OP) Apply to eye daily.    . benazepril-hydrochlorthiazide (LOTENSIN HCT) 10-12.5 MG tablet Take 1 tablet by mouth daily. 90 tablet 3   No facility-administered medications prior to visit.     Allergies  Allergen Reactions  . Tramadol Nausea Only  . Codeine Phosphate Nausea Only    REACTION: unspecified    ROS Review of Systems  Constitutional: Negative.   HENT: Negative.   Eyes: Negative for photophobia and visual disturbance.  Respiratory: Negative.   Cardiovascular: Negative.   Gastrointestinal: Negative.   Genitourinary: Negative.   Musculoskeletal: Negative for gait problem and joint swelling.  Neurological: Negative for weakness and light-headedness.  Hematological: Does not bruise/bleed easily.  Psychiatric/Behavioral: Negative.       Objective:    Physical Exam  Constitutional: She is oriented to person, place, and time. She appears well-developed and well-nourished. No distress.   HENT:  Head: Normocephalic and atraumatic.  Right Ear: External ear normal.  Left Ear: External ear normal.  Eyes: Conjunctivae are normal. Right eye exhibits no discharge. Left eye exhibits no discharge. No scleral icterus.  Neck: No JVD present. No tracheal deviation present.  Cardiovascular: Normal rate, regular rhythm and normal heart sounds.  Pulmonary/Chest: Breath sounds normal. No stridor.  Abdominal: Bowel sounds are normal.  Musculoskeletal:        General: No edema.  Neurological: She is alert and oriented to person, place, and time.  Skin: Skin is warm and dry. She is not diaphoretic.  Psychiatric: She has a normal mood and affect. Her behavior is normal.    BP 120/70   Pulse 86   Ht 5' 1.5" (1.562 m)   Wt 142 lb (64.4 kg)   SpO2 98%   BMI 26.40 kg/m  Wt Readings from Last 3 Encounters:  06/06/19 142 lb (64.4 kg)  05/06/19 140 lb 12.8 oz (63.9 kg)  03/31/19 136 lb 4.8 oz (61.8 kg)   BP Readings from Last 3 Encounters:  06/06/19 120/70  05/06/19 122/70  03/31/19 119/70   Guideline developer:  UpToDate (see UpToDate for funding source) Date Released: June 2014  Health Maintenance Due  Topic Date Due  . MAMMOGRAM  07/23/2016    There are no preventive care reminders to display for this patient.  Lab Results  Component Value Date   TSH 2.09 05/06/2019   Lab Results  Component Value Date   WBC 6.2 02/03/2019   HGB 12.4 02/03/2019   HCT 37.7 02/03/2019   MCV 79 02/03/2019   PLT 284 02/03/2019   Lab Results  Component Value Date   NA 138 06/06/2019   K 4.5 06/06/2019   CO2 28 06/06/2019   GLUCOSE 93 06/06/2019   BUN 23 06/06/2019   CREATININE 1.18 06/06/2019   BILITOT 0.4 02/03/2019   ALKPHOS 52 02/03/2019   AST 35 02/03/2019   ALT 16 02/03/2019   PROT 7.7 02/03/2019   ALBUMIN 4.7 (H) 02/03/2019   CALCIUM 10.8 (H) 06/06/2019   GFR 43.80 (L) 06/06/2019   Lab Results  Component Value Date   CHOL 281 (H) 02/03/2019   Lab Results   Component Value Date   HDL 93 02/03/2019   Lab Results  Component Value Date   LDLCALC 156 (H) 02/03/2019   Lab Results  Component Value Date   TRIG 161 (H) 02/03/2019   Lab Results  Component Value Date   CHOLHDL 3.0 02/03/2019   Lab Results  Component Value Date   HGBA1C 5.9 (H) 01/31/2013  Assessment & Plan:   Problem List Items Addressed This Visit      Cardiovascular and Mediastinum   Essential hypertension   Relevant Orders   Basic metabolic panel (Completed)     Endocrine   Hypothyroidism - Primary     Genitourinary   Chronic kidney disease (CKD) stage G3b   Relevant Orders   Basic metabolic panel (Completed)     Other   Hypercalcemia   Relevant Orders   PTH, intact (no Ca)      No orders of the defined types were placed in this encounter.   Follow-up: Return in about 1 month (around 07/06/2019).   PMP check today.  Discussed with patient that we may need to discontinue Lotensin pending results of lab work today.

## 2019-06-13 ENCOUNTER — Encounter: Payer: Self-pay | Admitting: Family Medicine

## 2019-06-15 ENCOUNTER — Other Ambulatory Visit (INDEPENDENT_AMBULATORY_CARE_PROVIDER_SITE_OTHER): Payer: Medicare HMO

## 2019-06-15 ENCOUNTER — Other Ambulatory Visit: Payer: Self-pay

## 2019-06-16 LAB — PARATHYROID HORMONE, INTACT (NO CA): PTH: 29 pg/mL (ref 14–64)

## 2019-06-27 ENCOUNTER — Other Ambulatory Visit: Payer: Self-pay

## 2019-06-27 ENCOUNTER — Telehealth (INDEPENDENT_AMBULATORY_CARE_PROVIDER_SITE_OTHER): Payer: Medicare HMO | Admitting: Rheumatology

## 2019-06-27 ENCOUNTER — Encounter: Payer: Self-pay | Admitting: Rheumatology

## 2019-06-27 DIAGNOSIS — E785 Hyperlipidemia, unspecified: Secondary | ICD-10-CM | POA: Diagnosis not present

## 2019-06-27 DIAGNOSIS — M19041 Primary osteoarthritis, right hand: Secondary | ICD-10-CM

## 2019-06-27 DIAGNOSIS — M5030 Other cervical disc degeneration, unspecified cervical region: Secondary | ICD-10-CM

## 2019-06-27 DIAGNOSIS — Z79899 Other long term (current) drug therapy: Secondary | ICD-10-CM

## 2019-06-27 DIAGNOSIS — I10 Essential (primary) hypertension: Secondary | ICD-10-CM | POA: Diagnosis not present

## 2019-06-27 DIAGNOSIS — M0579 Rheumatoid arthritis with rheumatoid factor of multiple sites without organ or systems involvement: Secondary | ICD-10-CM

## 2019-06-27 DIAGNOSIS — N1832 Chronic kidney disease, stage 3b: Secondary | ICD-10-CM

## 2019-06-27 DIAGNOSIS — Z8639 Personal history of other endocrine, nutritional and metabolic disease: Secondary | ICD-10-CM | POA: Diagnosis not present

## 2019-06-27 DIAGNOSIS — M19042 Primary osteoarthritis, left hand: Secondary | ICD-10-CM

## 2019-06-27 DIAGNOSIS — Z8679 Personal history of other diseases of the circulatory system: Secondary | ICD-10-CM | POA: Diagnosis not present

## 2019-06-27 DIAGNOSIS — M8589 Other specified disorders of bone density and structure, multiple sites: Secondary | ICD-10-CM | POA: Diagnosis not present

## 2019-06-27 NOTE — Progress Notes (Signed)
Virtual Visit via Telephone Note  I connected with [name] [name] on 06/27/19 at  8:15 AM EST by telephone and verified that I am speaking with the correct person using two identifiers.  Location: Patient: Home Provider: Clinic  This service was conducted via virtual visit.  The patient was located at home. I was located in my office.  Consent was obtained prior to the virtual visit and is aware of possible charges through their insurance for this visit.  The patient is an established patient.  Dr. Estanislado Pandy, MD conducted the virtual visit and [name], PA-C acted as scribe during the service.  Office staff helped with scheduling follow up visits after the service was conducted.   I discussed the limitations, risks, security and privacy concerns of performing an evaluation and management service by telephone and the availability of in person appointments. I also discussed with the patient that there may be a patient responsible charge related to this service. The patient expressed understanding and agreed to proceed.  CC: Right shoulder joint pain  History of Present Illness: Patient is a 84 year old female with a past medical history of seropositive rheumatoid arthritis and osteoarthritis.  She is taking Arava 20 mg po daily.  She is no longer taking PLQ. She is having increased right shoulder joint pain that started 4 days ago.  She was having difficulty with ROM.  She states the pain has improved taking extra strength tylenol.  She denies any other joint pain or joint swelling at this time.  She has noticed a significant improvement since increasing the dose of Arava from 10 mg to 20 mg po daily.  She states the pain and swelling in both hands has resolved. She has not been experiencing morning stiffness.  She denies any neck pain recently.   Review of Systems  Constitutional: Negative for fever and malaise/fatigue.  Eyes: Negative for photophobia, pain, discharge and redness.  Respiratory:  Negative for cough, shortness of breath and wheezing.   Cardiovascular: Negative for chest pain and palpitations.  Gastrointestinal: Negative for blood in stool, constipation and diarrhea.  Genitourinary: Negative for dysuria.  Musculoskeletal: Negative for back pain, joint pain, myalgias and neck pain.  Skin: Negative for rash.  Neurological: Negative for dizziness and headaches.  Psychiatric/Behavioral: Negative for depression. The patient is not nervous/anxious and does not have insomnia.       Observations/Objective: Physical Exam  Constitutional: She is oriented to person, place, and time.  Neurological: She is alert and oriented to person, place, and time.  Psychiatric: Mood, memory, affect and judgment normal.    Patient reports morning stiffness for 0 minutes.   Patient denies nocturnal pain.  Difficulty dressing/grooming: Denies Difficulty climbing stairs: Denies Difficulty getting out of chair: Denies Difficulty using hands for taps, buttons, cutlery, and/or writing: Denies  Assessment and Plan: Visit Diagnoses: Rheumatoid arthritis involving multiple sites with positive rheumatoid factor ([place]): She has been experiencing right shoulder joint pain for the past 4 days.  She did not have any injury that she is aware of.  She was experiencing difficulty with ROM due to the discomfort but her pain has started to improve after taking extra strength tylenol for the past several days.  She has no other joint pain or joint swelling at this time.  She has noticed significant improvement since increasing the dose of Arava to 20 mg po daily.  She will continue on this current treatment regimen.  She will follow up in 3-4 months.  High risk medication use -CMP was ordered on 06/06/19-stable. We will mail futures to her to have drawn at her upcoming appointment on 07/11/19.   Primary osteoarthritis of both hands: She is not experiencing any hand pain or joint swelling currently.  She has  no morning stiffness.  Joint protection and muscle strengthening were discussed.   Osteopenia of multiple sites -Future order for DEXA is in place.  The patient had to postpone having the scan performed.   DDD (degenerative disc disease), cervical: She is no experiencing any neck pain at this time.  No symptoms of radiculopathy.   Other medical conditions are listed as follows:  Dyslipidemia  Chronic kidney disease (CKD) stage G3b  History of hypertension  History of hypothyroidism  History of congestive heart failure   Follow Up Instructions: She will follow up in 3-4 months.  Mail lab orders.    I discussed the assessment and treatment plan with the patient. The patient was provided an opportunity to ask questions and all were answered. The patient agreed with the plan and demonstrated an understanding of the instructions.   The patient was advised to call back or seek an in-person evaluation if the symptoms worsen or if the condition fails to improve as anticipated.  I provided 15 minutes of non-face-to-face time during this encounter.  Bo Merino MD Scribed byOfilia Neas, PA-C

## 2019-07-07 ENCOUNTER — Ambulatory Visit: Payer: Medicare HMO | Admitting: Family Medicine

## 2019-07-08 ENCOUNTER — Ambulatory Visit: Payer: Medicare HMO | Admitting: Family Medicine

## 2019-07-12 ENCOUNTER — Other Ambulatory Visit: Payer: Self-pay

## 2019-07-12 ENCOUNTER — Ambulatory Visit (INDEPENDENT_AMBULATORY_CARE_PROVIDER_SITE_OTHER): Payer: Medicare HMO | Admitting: Family Medicine

## 2019-07-12 ENCOUNTER — Encounter: Payer: Self-pay | Admitting: Family Medicine

## 2019-07-12 VITALS — BP 116/84 | HR 94 | Temp 96.5°F | Ht 61.5 in | Wt 139.0 lb

## 2019-07-12 DIAGNOSIS — N1832 Chronic kidney disease, stage 3b: Secondary | ICD-10-CM | POA: Diagnosis not present

## 2019-07-12 DIAGNOSIS — E039 Hypothyroidism, unspecified: Secondary | ICD-10-CM | POA: Diagnosis not present

## 2019-07-12 DIAGNOSIS — I10 Essential (primary) hypertension: Secondary | ICD-10-CM

## 2019-07-12 LAB — COMPREHENSIVE METABOLIC PANEL
ALT: 24 U/L (ref 0–35)
AST: 25 U/L (ref 0–37)
Albumin: 4.1 g/dL (ref 3.5–5.2)
Alkaline Phosphatase: 59 U/L (ref 39–117)
BUN: 22 mg/dL (ref 6–23)
CO2: 26 mEq/L (ref 19–32)
Calcium: 10.2 mg/dL (ref 8.4–10.5)
Chloride: 104 mEq/L (ref 96–112)
Creatinine, Ser: 1.12 mg/dL (ref 0.40–1.20)
GFR: 46.51 mL/min — ABNORMAL LOW (ref >60.00)
Glucose, Bld: 100 mg/dL — ABNORMAL HIGH (ref 70–99)
Potassium: 5 mEq/L (ref 3.5–5.1)
Sodium: 139 mEq/L (ref 135–145)
Total Bilirubin: 0.5 mg/dL (ref 0.2–1.2)
Total Protein: 7.4 g/dL (ref 6.0–8.3)

## 2019-07-12 LAB — CBC
HCT: 38.1 % (ref 36.0–46.0)
Hemoglobin: 12.2 g/dL (ref 12.0–15.0)
MCHC: 31.9 g/dL (ref 30.0–36.0)
MCV: 83 fl (ref 78.0–100.0)
Platelets: 289 10*3/uL (ref 150.0–400.0)
RBC: 4.6 Mil/uL (ref 3.87–5.11)
RDW: 14 % (ref 11.5–15.5)
WBC: 4.3 10*3/uL (ref 4.0–10.5)

## 2019-07-12 NOTE — Progress Notes (Signed)
Established Patient Office Visit  Subjective:  Patient ID: [name] [name], female    DOB: Apr 29, 1935  Age: 84 y.o. MRN: [id number]  CC:  Chief Complaint  Patient presents with  . x 1 month follow up visit  . Medication Refill    Arava    HPI [name] [name] presents for follow-up of her CKD, elevated calcium, elevated LDL cholesterol.  Patient's HDL quite favorable at 90.  She continues to hydrate well.  Calcium levels have been stable.  Blood pressure has been well controlled on Lotensin.  Past Medical History:  Diagnosis Date  . HYPERLIPIDEMIA 01/19/2007  . HYPERTENSION 01/19/2007  . HYPOTHYROIDISM 01/19/2007  . Rheumatoid arthritis(714.0) 01/19/2007    Past Surgical History:  Procedure Laterality Date  . ABDOMINAL HYSTERECTOMY    . APPENDECTOMY    . CHOLECYSTECTOMY    . NECK SURGERY      Family History  Problem Relation Age of Onset  . Diabetes Father   . Heart disease Father   . Cancer Sister        breast   . Heart attack Brother   . Heart attack Brother   . Heart attack Brother   . Heart disease Sister     Social History   Socioeconomic History  . Marital status: Married    Spouse name: Not on file  . Number of children: Not on file  . Years of education: Not on file  . Highest education level: Not on file  Occupational History  . Not on file  Social Needs  . Financial resource strain: Not on file  . Food insecurity    Worry: Not on file    Inability: Not on file  . Transportation needs    Medical: Not on file    Non-medical: Not on file  Tobacco Use  . Smoking status: Never Smoker  . Smokeless tobacco: Never Used  Substance and Sexual Activity  . Alcohol use: No  . Drug use: No  . Sexual activity: Not on file  Lifestyle  . Physical activity    Days per week: Not on file    Minutes per session: Not on file  . Stress: Not on file  Relationships  . Social Herbalist on phone: Not on file    Gets together: Not on file   Attends religious service: Not on file    Active member of club or organization: Not on file    Attends meetings of clubs or organizations: Not on file    Relationship status: Not on file  . Intimate partner violence    Fear of current or ex partner: Not on file    Emotionally abused: Not on file    Physically abused: Not on file    Forced sexual activity: Not on file  Other Topics Concern  . Not on file  Social History Narrative  . Not on file    Outpatient Medications Prior to Visit  Medication Sig Dispense Refill  . benazepril (LOTENSIN) 10 MG tablet Take 1 tablet (10 mg total) by mouth daily. 90 tablet 0  . leflunomide (ARAVA) 20 MG tablet Take 1 tablet (20 mg total) by mouth daily. 90 tablet 0  . levothyroxine (SYNTHROID) 75 MCG tablet Take 1 tablet (75 mcg total) by mouth daily. 90 tablet 3  . Multiple Vitamins-Minerals (MULTIVITAMIN WITH MINERALS) tablet Take 1 tablet by mouth daily.    Vladimir Faster Glycol-Propyl Glycol (SYSTANE OP) Apply to eye daily.  No facility-administered medications prior to visit.     Allergies  Allergen Reactions  . Tramadol Nausea Only  . Codeine Phosphate Nausea Only    REACTION: unspecified    ROS Review of Systems  Constitutional: Negative.   HENT: Negative.   Eyes: Negative for photophobia and visual disturbance.  Respiratory: Negative.   Cardiovascular: Negative.   Gastrointestinal: Negative.   Endocrine: Negative for polyphagia and polyuria.  Genitourinary: Negative.   Musculoskeletal: Negative for gait problem and joint swelling.  Allergic/Immunologic: Negative for immunocompromised state.  Neurological: Negative for light-headedness and headaches.  Hematological: Does not bruise/bleed easily.  Psychiatric/Behavioral: Negative.       Objective:    Physical Exam  Constitutional: She is oriented to person, place, and time. She appears well-developed and well-nourished. No distress.  HENT:  Head: Normocephalic and  atraumatic.  Right Ear: External ear normal.  Left Ear: External ear normal.  Eyes: Conjunctivae are normal. Right eye exhibits no discharge. Left eye exhibits no discharge. No scleral icterus.  Neck: No JVD present. No tracheal deviation present.  Cardiovascular: Normal rate, regular rhythm and normal heart sounds.  Pulmonary/Chest: Effort normal and breath sounds normal. No stridor. No respiratory distress. She has no wheezes. She has no rales.  Musculoskeletal:        General: No edema.  Neurological: She is alert and oriented to person, place, and time.  Skin: Skin is warm and dry. She is not diaphoretic.  Psychiatric: She has a normal mood and affect. Her behavior is normal.    BP 116/84   Pulse 94   Temp (!) 96.5 F (35.8 C)   Ht 5' 1.5" (1.562 m)   Wt 139 lb (63 kg)   SpO2 97%   BMI 25.84 kg/m  Wt Readings from Last 3 Encounters:  07/12/19 139 lb (63 kg)  06/06/19 142 lb (64.4 kg)  05/06/19 140 lb 12.8 oz (63.9 kg)     Health Maintenance Due  Topic Date Due  . MAMMOGRAM  07/23/2016    There are no preventive care reminders to display for this patient.  Lab Results  Component Value Date   TSH 2.09 05/06/2019   Lab Results  Component Value Date   WBC 6.2 02/03/2019   HGB 12.4 02/03/2019   HCT 37.7 02/03/2019   MCV 79 02/03/2019   PLT 284 02/03/2019   Lab Results  Component Value Date   NA 138 06/06/2019   K 4.5 06/06/2019   CO2 28 06/06/2019   GLUCOSE 93 06/06/2019   BUN 23 06/06/2019   CREATININE 1.18 06/06/2019   BILITOT 0.4 02/03/2019   ALKPHOS 52 02/03/2019   AST 35 02/03/2019   ALT 16 02/03/2019   PROT 7.7 02/03/2019   ALBUMIN 4.7 (H) 02/03/2019   CALCIUM 10.8 (H) 06/06/2019   GFR 43.80 (L) 06/06/2019   Lab Results  Component Value Date   CHOL 281 (H) 02/03/2019   Lab Results  Component Value Date   HDL 93 02/03/2019   Lab Results  Component Value Date   LDLCALC 156 (H) 02/03/2019   Lab Results  Component Value Date   TRIG 161  (H) 02/03/2019   Lab Results  Component Value Date   CHOLHDL 3.0 02/03/2019   Lab Results  Component Value Date   HGBA1C 5.9 (H) 01/31/2013      Assessment & Plan:   Problem List Items Addressed This Visit      Cardiovascular and Mediastinum   Essential hypertension  Endocrine   Hypothyroidism - Primary     Genitourinary   Chronic kidney disease (CKD) stage G3b   Relevant Orders   CBC   Comp Met (CMET)     Other   Hypercalcemia   Relevant Orders   Comp Met (CMET)      No orders of the defined types were placed in this encounter.   Follow-up: Return in about 3 months (around 10/10/2019), or may be sooner as labs dictate.Libby Maw, MD

## 2019-07-15 ENCOUNTER — Other Ambulatory Visit: Payer: Self-pay | Admitting: Rheumatology

## 2019-07-15 NOTE — Telephone Encounter (Signed)
Last Visit: 06/27/2019 Next Visit: 10/06/2019 Labs: 07/12/2019 stable   Okay to refill per Dr. Estanislado Pandy.

## 2019-08-04 ENCOUNTER — Other Ambulatory Visit: Payer: Self-pay | Admitting: Family Medicine

## 2019-08-04 DIAGNOSIS — N1832 Chronic kidney disease, stage 3b: Secondary | ICD-10-CM

## 2019-08-04 DIAGNOSIS — I10 Essential (primary) hypertension: Secondary | ICD-10-CM

## 2019-09-01 ENCOUNTER — Ambulatory Visit: Payer: Medicare HMO | Admitting: Physician Assistant

## 2019-09-30 NOTE — Progress Notes (Signed)
Office Visit Note  Patient: [name] [name]             Date of Birth: 1935-03-18           MRN: [id number]             PCP: Libby Maw, MD Referring: Libby Maw,* Visit Date: 10/06/2019 Occupation: @GUAROCC @  Subjective:  Medication Monitoring  History of Present Illness: [name] [name] is a 85 y.o. female with a past medical history of seropositive rheumatoid arthritis and osteoarthritis. She is currently on Arava 20 mg 1 tablet by mouth daily. She is tolerating this medication well and has not missed any doses. She denies any joint pain or joint swelling at this time. She walks regularly at home on a treadmill 3-4 times a week for 45-60 minutes. She also occasionally lifts 10 lb weights.  Activities of Daily Living:  Patient reports morning stiffness for 0 minutes.   Patient Denies nocturnal pain.  Difficulty dressing/grooming: Denies Difficulty climbing stairs: Denies Difficulty getting out of chair: Denies Difficulty using hands for taps, buttons, cutlery, and/or writing: Denies  Review of Systems  Constitutional: Negative for fatigue.  HENT: Negative for mouth sores, mouth dryness and nose dryness.   Eyes: Negative for itching and dryness.  Respiratory: Negative for shortness of breath, wheezing and difficulty breathing.   Cardiovascular: Negative for chest pain, palpitations and swelling in legs/feet.  Gastrointestinal: Negative for blood in stool, constipation and diarrhea.  Endocrine: Negative for increased urination.  Genitourinary: Negative for difficulty urinating and painful urination.  Musculoskeletal: Negative for arthralgias, joint pain, joint swelling and morning stiffness.  Skin: Negative for rash.  Allergic/Immunologic: Negative for susceptible to infections.  Neurological: Positive for memory loss. Negative for dizziness, numbness, headaches and weakness.  Hematological: Positive for bruising/bleeding tendency.    Psychiatric/Behavioral: Negative for confusion and sleep disturbance.    PMFS History:  Patient Active Problem List   Diagnosis Date Noted  . Hypercalcemia 06/06/2019  . DDD (degenerative disc disease), cervical 07/13/2018  . Chronic kidney disease (CKD) stage G3b 01/21/2017  . History of hypothyroidism 01/20/2017  . History of congestive heart failure 08/26/2016  . Osteopenia of multiple sites 08/25/2016  . DDD cervical spine 08/25/2016  . Primary osteoarthritis of both hands 08/25/2016  . High risk medication use 06/11/2016  . CHF, acute ([place]) 02/01/2013  . 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 Hypothyroidism 01/19/2007  . Dyslipidemia 01/19/2007  . Essential hypertension 01/19/2007  . Rheumatoid arthritis ([place]) 01/19/2007    Past Medical History:  Diagnosis Date  . HYPERLIPIDEMIA 01/19/2007  . HYPERTENSION 01/19/2007  . HYPOTHYROIDISM 01/19/2007  . Rheumatoid arthritis(714.0) 01/19/2007    Family History  Problem Relation Age of Onset  . Diabetes Father   . Heart disease Father   . Cancer Sister        breast   . Heart attack Brother   . Heart attack Brother   . Heart attack Brother   . Heart disease Sister    Past Surgical History:  Procedure Laterality Date  . ABDOMINAL HYSTERECTOMY    . APPENDECTOMY    . CHOLECYSTECTOMY    . NECK SURGERY     Social History   Social History Narrative  . Not on file   Immunization History  Administered Date(s) Administered  . Fluad Quad(high Dose 65+) 05/06/2019  . Influenza,inj,Quad PF,6+ Mos 07/06/2013  . Pneumococcal Conjugate-13 07/06/2013  . Pneumococcal Polysaccharide-23 04/09/2011, 06/21/2012  . Tdap 04/09/2011     Objective:  Vital Signs: BP (!) 153/81 (BP Location: Left Arm, Patient Position: Sitting, Cuff Size: Normal)   Pulse 83   Resp 13   Ht 5' 1.5" (1.562 m)   Wt 140 lb 3.2 oz (63.6 kg)   BMI 26.06 kg/m    Physical Exam Constitutional:      General: She is not in acute distress.    Appearance: Normal  appearance.  HENT:     Head: Normocephalic and atraumatic.  Eyes:     Conjunctiva/sclera: Conjunctivae normal.  Cardiovascular:     Rate and Rhythm: Normal rate.  Pulmonary:     Effort: Pulmonary effort is normal.  Abdominal:     Palpations: Abdomen is soft.  Musculoskeletal:        General: No swelling or tenderness. Normal range of motion.     Cervical back: Normal range of motion. No tenderness.     Right lower leg: No edema.     Left lower leg: No edema.  Skin:    General: Skin is warm and dry.  Neurological:     Mental Status: She is oriented to person, place, and time.  Psychiatric:        Behavior: Behavior normal.    Musculoskeletal Exam: Cervical spine, thoracic spine, and lumbar spine with good ROM and no discomfort. No midline spinal tenderness or SI joint tenderness. Shoulder joints, elbow joints, wrist joints, MCPs, PIPs, and DIPs with good ROM and no synovitis. Complete fist formation bilaterally. Two rheumatoid nodules present, one on the right 3rd digit and one on the right 5th digit. Hip joints, knee joints, ankle joints, MTPs, and PIPs with good ROM. No warmth or effusion of bilateral knees. No tenderness or edema of bilateral ankle joints.   CDAI Exam: CDAI Score: 0.2  Patient Global: 1 mm; Provider Global: 1 mm Swollen: 0 ; Tender: 0  Joint Exam 10/06/2019   No joint exam has been documented for this visit   There is currently no information documented on the homunculus. Go to the Rheumatology activity and complete the homunculus joint exam.  Investigation: No additional findings.  Imaging: No results found.  Recent Labs: Lab Results  Component Value Date   WBC 4.3 07/12/2019   HGB 12.2 07/12/2019   PLT 289.0 07/12/2019   NA 139 07/12/2019   K 5.0 07/12/2019   CL 104 07/12/2019   CO2 26 07/12/2019   GLUCOSE 100 (H) 07/12/2019   BUN 22 07/12/2019   CREATININE 1.12 07/12/2019   BILITOT 0.5 07/12/2019   ALKPHOS 59 07/12/2019   AST 25  07/12/2019   ALT 24 07/12/2019   PROT 7.4 07/12/2019   ALBUMIN 4.1 07/12/2019   CALCIUM 10.2 07/12/2019   GFRAA 37 (L) 02/03/2019    Speciality Comments: PLQ Eye Exam: 10/04/2019 @ [facility]  Prior therapy: Methotrexate (lung injury)   Procedures:  No procedures performed Allergies: Tramadol and Codeine phosphate   Assessment / Plan:     Visit Diagnoses: Rheumatoid arthritis involving multiple sites with positive rheumatoid factor ([place]) - She is currently on Arava 20 mg 1 tablet by mouth daily. She is tolerating this medication well with no missed doses. She denies any joint pain or joint swelling. No tenderness noted on exam or synovitis. She has two rheumatoid arthritis nodules present, one on the right 3rd digit and one on the right 5th digit.  Nodule was noted on the left second digit.  She will continue her current Arava treatment regimen. She will follow up in 5  months.  High risk medication use - Arava 20 mg 1 tablet by mouth daily. - She is currently tolerating this medication well with no missed doses. Plan: CBC with Differential/Platelet, COMPLETE METABOLIC PANEL WITH GFR today and then every 3 months.  Primary osteoarthritis of both hands - She denies any joint pain or joint swelling at this time. No tenderness or synovitis noted on examination.  Rheumatoid arthritis nodules present, one on the left index finger, right 3rd digit and one on the right 5th digit.   DDD (degenerative disc disease), cervical - She denies any cervical pain at this time. No symptoms of radiculopathy. No tenderness upon palpation.   Osteopenia of multiple sites - Future order for DEXA is in place. She has received a phone call from [facility] to schedule an appointment. She states that she will call and do so.  Other medical conditions are listed as follows:  Dyslipidemia  History of hypothyroidism  Chronic kidney disease (CKD) stage G3b  History of congestive heart failure  History of  hypertension  Orders: Orders Placed This Encounter  Procedures  . CBC with Differential/Platelet  . COMPLETE METABOLIC PANEL WITH GFR   No orders of the defined types were placed in this encounter.     Follow-Up Instructions: Return in about 5 months (around 03/07/2020) for Rheumatoid arthritis.   Bo Merino, MD  Note - This record has been created using Editor, commissioning.  Chart creation errors have been sought, but may not always  have been located. Such creation errors do not reflect on  the standard of medical care.

## 2019-10-04 DIAGNOSIS — H04123 Dry eye syndrome of bilateral lacrimal glands: Secondary | ICD-10-CM | POA: Diagnosis not present

## 2019-10-04 DIAGNOSIS — M069 Rheumatoid arthritis, unspecified: Secondary | ICD-10-CM | POA: Diagnosis not present

## 2019-10-04 DIAGNOSIS — Z79899 Other long term (current) drug therapy: Secondary | ICD-10-CM | POA: Diagnosis not present

## 2019-10-04 DIAGNOSIS — H3561 Retinal hemorrhage, right eye: Secondary | ICD-10-CM | POA: Diagnosis not present

## 2019-10-04 NOTE — Progress Notes (Deleted)
Subjective:   TARIS KOSMAN is a 85 y.o. female who presents for Medicare Annual (Subsequent) preventive examination.  Review of Systems:  Home Safety/Smoke Alarms: Feels safe in home. Smoke alarms in place.   Female:        Mammo-       Dexa scan-           Objective:     Vitals: There were no vitals taken for this visit.  There is no height or weight on file to calculate BMI.  Advanced Directives 01/31/2013  Does Patient Have a Medical Advance Directive? Patient has advance directive, copy not in chart  Type of Advance Directive Living will  [place] in Chart? Copy requested from family  Pre-existing out of facility DNR order (yellow form or pink MOST form) No    Tobacco Social History   Tobacco Use  Smoking Status Never Smoker  Smokeless Tobacco Never Used     Counseling given: Not Answered   Clinical Intake:                       Past Medical History:  Diagnosis Date  . HYPERLIPIDEMIA 01/19/2007  . HYPERTENSION 01/19/2007  . HYPOTHYROIDISM 01/19/2007  . Rheumatoid arthritis(714.0) 01/19/2007   Past Surgical History:  Procedure Laterality Date  . ABDOMINAL HYSTERECTOMY    . APPENDECTOMY    . CHOLECYSTECTOMY    . NECK SURGERY     Family History  Problem Relation Age of Onset  . Diabetes Father   . Heart disease Father   . Cancer Sister        breast   . Heart attack Brother   . Heart attack Brother   . Heart attack Brother   . Heart disease Sister    Social History   Socioeconomic History  . Marital status: Married    Spouse name: Not on file  . Number of children: Not on file  . Years of education: Not on file  . Highest education level: Not on file  Occupational History  . Not on file  Tobacco Use  . Smoking status: Never Smoker  . Smokeless tobacco: Never Used  Substance and Sexual Activity  . Alcohol use: No  . Drug use: No  . Sexual activity: Not on file  Other Topics Concern  . Not on file   Social History Narrative  . Not on file   Social Determinants of Health   Financial Resource Strain:   . Difficulty of Paying Living Expenses: Not on file  Food Insecurity:   . Worried About Charity fundraiser in the Last Year: Not on file  . Ran Out of Food in the Last Year: Not on file  Transportation Needs:   . Lack of Transportation (Medical): Not on file  . Lack of Transportation (Non-Medical): Not on file  Physical Activity:   . Days of Exercise per Week: Not on file  . Minutes of Exercise per Session: Not on file  Stress:   . Feeling of Stress : Not on file  Social Connections:   . Frequency of Communication with Friends and Family: Not on file  . Frequency of Social Gatherings with Friends and Family: Not on file  . Attends Religious Services: Not on file  . Active Member of Clubs or Organizations: Not on file  . Attends Archivist Meetings: Not on file  . Marital Status: Not on file  Outpatient Encounter Medications as of 10/11/2019  Medication Sig  . benazepril (LOTENSIN) 10 MG tablet TAKE 1 TABLET BY MOUTH EVERY DAY  . leflunomide (ARAVA) 20 MG tablet Take 1 tablet (20 mg total) by mouth daily.  Marland Kitchen levothyroxine (SYNTHROID) 75 MCG tablet Take 1 tablet (75 mcg total) by mouth daily.  . Multiple Vitamins-Minerals (MULTIVITAMIN WITH MINERALS) tablet Take 1 tablet by mouth daily.  Vladimir Faster Glycol-Propyl Glycol (SYSTANE OP) Apply to eye daily.   No facility-administered encounter medications on file as of 10/11/2019.    Activities of Daily Living No flowsheet data found.  Patient Care Team: Libby Maw, MD as PCP - General (Family Medicine)    Assessment:   This is a routine wellness examination for Demiah. Physical assessment deferred to PCP.  Exercise Activities and Dietary recommendations   Diet (meal preparation, eat out, water intake, caffeinated beverages, dairy products, fruits and vegetables): {Desc;  diets:16563} Breakfast: Lunch:  Dinner:      Goals   None     Fall Risk Fall Risk  11/26/2017 11/13/2016 10/22/2015 10/19/2014  Falls in the past year? No No No No   Depression Screen PHQ 2/9 Scores 11/26/2017 11/13/2016 10/22/2015 10/19/2014  PHQ - 2 Score 0 0 0 1     Cognitive Function Ad8 score reviewed for issues:  Issues making decisions:  Less interest in hobbies / activities:  Repeats questions, stories (family complaining):  Trouble using ordinary gadgets (microwave, computer, phone):  Forgets the month or year:   Mismanaging finances:   Remembering appts:  Daily problems with thinking and/or memory: Ad8 score is=         Immunization History  Administered Date(s) Administered  . Fluad Quad(high Dose 65+) 05/06/2019  . Influenza,inj,Quad PF,6+ Mos 07/06/2013  . Pneumococcal Conjugate-13 07/06/2013  . Pneumococcal Polysaccharide-23 04/09/2011, 06/21/2012  . Tdap 04/09/2011   Screening Tests Health Maintenance  Topic Date Due  . MAMMOGRAM  07/23/2016  . TETANUS/TDAP  04/08/2021  . INFLUENZA VACCINE  Completed  . DEXA SCAN  Completed  . PNA vac Low Risk Adult  Completed      Plan:   ***   I have personally reviewed and noted the following in the patient's chart:   . Medical and social history . Use of alcohol, tobacco or illicit drugs  . Current medications and supplements . Functional ability and status . Nutritional status . Physical activity . Advanced directives . List of other physicians . Hospitalizations, surgeries, and ER visits in previous 12 months . Vitals . Screenings to include cognitive, depression, and falls . Referrals and appointments  In addition, I have reviewed and discussed with patient certain preventive protocols, quality metrics, and best practice recommendations. A written personalized care plan for preventive services as well as general preventive health recommendations were provided to patient.     Naaman Plummer [place], [place]  10/04/2019

## 2019-10-05 ENCOUNTER — Encounter: Payer: Self-pay | Admitting: *Deleted

## 2019-10-05 ENCOUNTER — Ambulatory Visit (INDEPENDENT_AMBULATORY_CARE_PROVIDER_SITE_OTHER): Payer: Medicare HMO | Admitting: *Deleted

## 2019-10-05 DIAGNOSIS — Z0000 Encounter for general adult medical examination without abnormal findings: Secondary | ICD-10-CM

## 2019-10-05 NOTE — Progress Notes (Addendum)
Virtual Visit via Audio Note  I connected with patient on 10/05/19 at  8:45 AM EST by a audio enabled telemedicine application and verified that I am speaking with the correct person using two identifiers.   THIS ENCOUNTER IS A VIRTUAL VISIT DUE TO COVID-19 - PATIENT WAS NOT SEEN IN THE OFFICE. PATIENT HAS CONSENTED TO VIRTUAL VISIT / TELEMEDICINE VISIT   Location of patient: home  Location of provider: office  I discussed the limitations of evaluation and management by telemedicine and the availability of in person appointments. The patient expressed understanding and agreed to proceed.   Subjective:   [name] [name] is a 85 y.o. female who presents for Medicare Annual (Subsequent) preventive examination.  Review of Systems:    Home Safety/Smoke Alarms: Feels safe in home. Smoke alarms in place.  Lives alone in 1 story townhouse.Step over tub w/ grab bars. Brother lives very close. Sons also visits often. States the two of them make sure she has everything she needs.     Female:   Mammo- declines      Dexa scan- pt states she will schedule.       Eye- Pt reports last exam done 10/04/19 w/ Dr. Kathlen Mody.     Objective:     Vitals: Unable to assess. This visit is enabled though telemedicine due to Covid 19.   Advanced Directives 10/05/2019 01/31/2013  Does Patient Have a Medical Advance Directive? Yes Patient has advance directive, copy not in chart  Type of Advance Directive [name] [name];Living will Living will  Does patient want to make changes to medical advance directive? Yes (MAU/Ambulatory/Procedural Areas - Information given) -  Copy of [place] in Chart? Yes - validated most recent copy scanned in chart (See row information) Copy requested from family  Pre-existing out of facility DNR order (yellow form or pink MOST form) - No    Tobacco Social History   Tobacco Use  Smoking Status Never Smoker  Smokeless Tobacco Never Used       Counseling given: Not Answered   Clinical Intake: Pain : No/denies pain     Past Medical History:  Diagnosis Date   HYPERLIPIDEMIA 01/19/2007   HYPERTENSION 01/19/2007   HYPOTHYROIDISM 01/19/2007   Rheumatoid arthritis(714.0) 01/19/2007   Past Surgical History:  Procedure Laterality Date   ABDOMINAL HYSTERECTOMY     APPENDECTOMY     CHOLECYSTECTOMY     NECK SURGERY     Family History  Problem Relation Age of Onset   Diabetes Father    Heart disease Father    Cancer Sister        breast    Heart attack Brother    Heart attack Brother    Heart attack Brother    Heart disease Sister    Social History   Socioeconomic History   Marital status: Married    Spouse name: Not on file   Number of children: Not on file   Years of education: Not on file   Highest education level: Not on file  Occupational History   Not on file  Tobacco Use   Smoking status: Never Smoker   Smokeless tobacco: Never Used  Substance and Sexual Activity   Alcohol use: No   Drug use: No   Sexual activity: Not on file  Other Topics Concern   Not on file  Social History Narrative   Not on file   Social Determinants of Health   Financial Resource Strain: Low Risk  Difficulty of Paying Living Expenses: Not hard at all  Food Insecurity: No Food Insecurity   Worried About [place] in the Last Year: Never true   Ran Out of Food in the Last Year: Never true  Transportation Needs: No Transportation Needs   Lack of Transportation (Medical): No   Lack of Transportation (Non-Medical): No  Physical Activity:    Days of Exercise per Week: Not on file   Minutes of Exercise per Session: Not on file  Stress:    Feeling of Stress : Not on file  Social Connections:    Frequency of Communication with Friends and Family: Not on file   Frequency of Social Gatherings with Friends and Family: Not on file   Attends Religious Services: Not on file   Active Member of Clubs or Organizations: Not  on file   Attends Club or Organization Meetings: Not on file   Marital Status: Not on file    Outpatient Encounter Medications as of 10/05/2019  Medication Sig   benazepril (LOTENSIN) 10 MG tablet TAKE 1 TABLET BY MOUTH EVERY DAY   leflunomide (ARAVA) 20 MG tablet Take 1 tablet (20 mg total) by mouth daily.   levothyroxine (SYNTHROID) 75 MCG tablet Take 1 tablet (75 mcg total) by mouth daily.   Multiple Vitamins-Minerals (MULTIVITAMIN WITH MINERALS) tablet Take 1 tablet by mouth daily.   Polyethyl Glycol-Propyl Glycol (SYSTANE OP) Apply to eye daily.   No facility-administered encounter medications on file as of 10/05/2019.    Activities of Daily Living In your present state of health, do you have any difficulty performing the following activities: 10/05/2019  Hearing? N  Vision? N  Difficulty concentrating or making decisions? N  Walking or climbing stairs? N  Dressing or bathing? N  Doing errands, shopping? N  Preparing Food and eating ? N  Using the Toilet? N  In the past six months, have you accidently leaked urine? N  Do you have problems with loss of bowel control? N  Managing your Medications? N  Managing your Finances? N  Housekeeping or managing your Housekeeping? N  Some recent data might be hidden    Patient Care Team: Libby Maw, MD as PCP - General (Family Medicine)    Assessment:   This is a routine wellness examination for [name] [name]. Physical assessment deferred to PCP.  Exercise Activities and Dietary recommendations Current Exercise Habits: Home exercise routine, Type of exercise: treadmill, Time (Minutes): 45, Frequency (Times/Week): 3, Weekly Exercise (Minutes/Week): 135, Intensity: Mild, Exercise limited by: None identified   Diet (meal preparation, eat out, water intake, caffeinated beverages, dairy products, fruits and vegetables):  Breakfast: ensure Lunch: soup or eggs Dinner: toast   Goals      Maintain healthy lifestyle and independence          Fall Risk Fall Risk  10/05/2019 11/26/2017 11/13/2016 10/22/2015 10/19/2014  Falls in the past year? 0 No No No No  Number falls in past yr: 0 - - - -  Injury with Fall? 0 - - - -  Follow up Education provided;Falls prevention discussed - - - -     Depression Screen PHQ 2/9 Scores 10/05/2019 11/26/2017 11/13/2016 10/22/2015  PHQ - 2 Score 0 0 0 0     Cognitive Function   Ad8 score reviewed for issues: Issues making decisions:no Less interest in hobbies / activities:no Repeats questions, stories (family complaining):no Trouble using ordinary gadgets (microwave, computer, phone):no Forgets the month or year: no Mismanaging finances: no  Remembering appts:no Daily problems with thinking and/or memory:no Ad8 score is=0      Immunization History  Administered Date(s) Administered   Fluad Quad(high Dose 65+) 05/06/2019   Influenza,inj,Quad PF,6+ Mos 07/06/2013   Pneumococcal Conjugate-13 07/06/2013   Pneumococcal Polysaccharide-23 04/09/2011, 06/21/2012   Tdap 04/09/2011    Screening Tests Health Maintenance  Topic Date Due   MAMMOGRAM  10/04/2020 (Originally 07/23/2016)   TETANUS/TDAP  04/08/2021   INFLUENZA VACCINE  Completed   DEXA SCAN  Completed   PNA vac Low Risk Adult  Completed     Plan:   See you next year!  Continue to eat heart healthy diet (full of fruits, vegetables, whole grains, lean protein, water--limit salt, fat, and sugar intake) and increase physical activity as tolerated.  Continue doing brain stimulating activities (puzzles, reading, adult coloring books, staying active) to keep memory sharp.   Bring a copy of your living will and/or healthcare power of attorney to your next office visit.  Please schedule bone density scan as discussed.   I have personally reviewed and noted the following in the patient's chart:   Medical and social history Use of alcohol, tobacco or illicit drugs  Current medications and supplements Functional ability and  status Nutritional status Physical activity Advanced directives List of other physicians Hospitalizations, surgeries, and ER visits in previous 12 months Vitals Screenings to include cognitive, depression, and falls Referrals and appointments  In addition, I have reviewed and discussed with patient certain preventive protocols, quality metrics, and best practice recommendations. A written personalized care plan for preventive services as well as general preventive health recommendations were provided to patient.     Shela Nevin, [place]  10/05/2019  Agreed.

## 2019-10-05 NOTE — Patient Instructions (Signed)
See you next year!  Continue to eat heart healthy diet (full of fruits, vegetables, whole grains, lean protein, water--limit salt, fat, and sugar intake) and increase physical activity as tolerated.  Continue doing brain stimulating activities (puzzles, reading, adult coloring books, staying active) to keep memory sharp.   Bring a copy of your living will and/or healthcare power of attorney to your next office visit.  Please schedule bone density scan as discussed.    [name] , Thank you for taking time to come for your Medicare Wellness Visit. I appreciate your ongoing commitment to your health goals. Please review the following plan we discussed and let me know if I can assist you in the future.   These are the goals we discussed: Goals    . Maintain healthy lifestyle and independence       This is a list of the screening recommended for you and due dates:  Health Maintenance  Topic Date Due  . Mammogram  10/04/2020*  . Tetanus Vaccine  04/08/2021  . Flu Shot  Completed  . DEXA scan (bone density measurement)  Completed  . Pneumonia vaccines  Completed  *Topic was postponed. The date shown is not the original due date.    Preventive Care 64 Years and Older, Female Preventive care refers to lifestyle choices and visits with your health care provider that can promote health and wellness. This includes:  A yearly physical exam. This is also called an annual well check.  Regular dental and eye exams.  Immunizations.  Screening for certain conditions.  Healthy lifestyle choices, such as diet and exercise. What can I expect for my preventive care visit? Physical exam Your health care provider will check:  Height and weight. These may be used to calculate body mass index (BMI), which is a measurement that tells if you are at a healthy weight.  Heart rate and blood pressure.  Your skin for abnormal spots. Counseling Your health care provider may ask you questions  about:  Alcohol, tobacco, and drug use.  Emotional well-being.  Home and relationship well-being.  Sexual activity.  Eating habits.  History of falls.  Memory and ability to understand (cognition).  Work and work Statistician.  Pregnancy and menstrual history. What immunizations do I need?  Influenza (flu) vaccine  This is recommended every year. Tetanus, diphtheria, and pertussis (Tdap) vaccine  You may need a Td booster every 10 years. Varicella (chickenpox) vaccine  You may need this vaccine if you have not already been vaccinated. Zoster (shingles) vaccine  You may need this after age 15. Pneumococcal conjugate (PCV13) vaccine  One dose is recommended after age 69. Pneumococcal polysaccharide (PPSV23) vaccine  One dose is recommended after age 28. Measles, mumps, and rubella (MMR) vaccine  You may need at least one dose of MMR if you were born in 1957 or later. You may also need a second dose. Meningococcal conjugate (MenACWY) vaccine  You may need this if you have certain conditions. Hepatitis A vaccine  You may need this if you have certain conditions or if you travel or work in places where you may be exposed to hepatitis A. Hepatitis B vaccine  You may need this if you have certain conditions or if you travel or work in places where you may be exposed to hepatitis B. Haemophilus influenzae type b (Hib) vaccine  You may need this if you have certain conditions. You may receive vaccines as individual doses or as more than one vaccine together in  one shot (combination vaccines). Talk with your health care provider about the risks and benefits of combination vaccines. What tests do I need? Blood tests  Lipid and cholesterol levels. These may be checked every 5 years, or more frequently depending on your overall health.  Hepatitis C test.  Hepatitis B test. Screening  Lung cancer screening. You may have this screening every year starting at age 55 if  you have a 30-pack-year history of smoking and currently smoke or have quit within the past 15 years.  Colorectal cancer screening. All adults should have this screening starting at age 50 and continuing until age 75. Your health care provider may recommend screening at age 45 if you are at increased risk. You will have tests every 1-10 years, depending on your results and the type of screening test.  Diabetes screening. This is done by checking your blood sugar (glucose) after you have not eaten for a while (fasting). You may have this done every 1-3 years.  Mammogram. This may be done every 1-2 years. Talk with your health care provider about how often you should have regular mammograms.  BRCA-related cancer screening. This may be done if you have a family history of breast, ovarian, tubal, or peritoneal cancers. Other tests  Sexually transmitted disease (STD) testing.  Bone density scan. This is done to screen for osteoporosis. You may have this done starting at age 65. Follow these instructions at home: Eating and drinking  Eat a diet that includes fresh fruits and vegetables, whole grains, lean protein, and low-fat dairy products. Limit your intake of foods with high amounts of sugar, saturated fats, and salt.  Take vitamin and mineral supplements as recommended by your health care provider.  Do not drink alcohol if your health care provider tells you not to drink.  If you drink alcohol: ? Limit how much you have to 0-1 drink a day. ? Be aware of how much alcohol is in your drink. In the [place] one drink equals one 12 oz bottle of beer (355 mL), one 5 oz glass of wine (148 mL), or one 1 oz glass of hard liquor (44 mL). Lifestyle  Take daily care of your teeth and gums.  Stay active. Exercise for at least 30 minutes on 5 or more days each week.  Do not use any products that contain nicotine or tobacco, such as cigarettes, e-cigarettes, and chewing tobacco. If you need help  quitting, ask your health care provider.  If you are sexually active, practice safe sex. Use a condom or other form of protection in order to prevent STIs (sexually transmitted infections).  Talk with your health care provider about taking a low-dose aspirin or statin. What's next?  Go to your health care provider once a year for a well check visit.  Ask your health care provider how often you should have your eyes and teeth checked.  Stay up to date on all vaccines. This information is not intended to replace advice given to you by your health care provider. Make sure you discuss any questions you have with your health care provider. Document Revised: 07/15/2018 Document Reviewed: 07/15/2018 Elsevier Patient Education  2020 Elsevier Inc.  

## 2019-10-06 ENCOUNTER — Ambulatory Visit: Payer: Medicare HMO | Admitting: Rheumatology

## 2019-10-06 ENCOUNTER — Encounter: Payer: Self-pay | Admitting: Physician Assistant

## 2019-10-06 ENCOUNTER — Other Ambulatory Visit: Payer: Self-pay

## 2019-10-06 VITALS — BP 153/81 | HR 83 | Resp 13 | Ht 61.5 in | Wt 140.2 lb

## 2019-10-06 DIAGNOSIS — E785 Hyperlipidemia, unspecified: Secondary | ICD-10-CM

## 2019-10-06 DIAGNOSIS — Z79899 Other long term (current) drug therapy: Secondary | ICD-10-CM

## 2019-10-06 DIAGNOSIS — M0579 Rheumatoid arthritis with rheumatoid factor of multiple sites without organ or systems involvement: Secondary | ICD-10-CM | POA: Diagnosis not present

## 2019-10-06 DIAGNOSIS — M5030 Other cervical disc degeneration, unspecified cervical region: Secondary | ICD-10-CM | POA: Diagnosis not present

## 2019-10-06 DIAGNOSIS — Z8679 Personal history of other diseases of the circulatory system: Secondary | ICD-10-CM | POA: Diagnosis not present

## 2019-10-06 DIAGNOSIS — M8589 Other specified disorders of bone density and structure, multiple sites: Secondary | ICD-10-CM | POA: Diagnosis not present

## 2019-10-06 DIAGNOSIS — Z8639 Personal history of other endocrine, nutritional and metabolic disease: Secondary | ICD-10-CM

## 2019-10-06 DIAGNOSIS — M19042 Primary osteoarthritis, left hand: Secondary | ICD-10-CM | POA: Diagnosis not present

## 2019-10-06 DIAGNOSIS — M19041 Primary osteoarthritis, right hand: Secondary | ICD-10-CM

## 2019-10-06 DIAGNOSIS — N1832 Chronic kidney disease, stage 3b: Secondary | ICD-10-CM

## 2019-10-06 NOTE — Patient Instructions (Signed)
Standing Labs We placed an order today for your standing lab work.    Please come back and get your standing labs in June and every 3 months.   We have open lab daily Monday through Thursday from 8:30-12:30 PM and 1:30-4:30 PM and Friday from 8:30-12:30 PM and 1:30-4:00 PM at the office of Dr. Royalty Fakhouri.   You may experience shorter wait times on Monday and Friday afternoons. The office is located at [address], [address], [place], [place] [address] No appointment is necessary.   Labs are drawn by [facility].  You may receive a bill from [facility] for your lab work.  If you wish to have your labs drawn at another location, please call the office 24 hours in advance to send orders.  If you have any questions regarding directions or hours of operation,  please call [phone].   Just as a reminder please drink plenty of water prior to coming for your lab work. Thanks!  

## 2019-10-07 ENCOUNTER — Other Ambulatory Visit: Payer: Self-pay | Admitting: Rheumatology

## 2019-10-07 LAB — CBC WITH DIFFERENTIAL/PLATELET
Absolute Monocytes: 594 cells/uL (ref 200–950)
Basophils Absolute: 50 cells/uL (ref 0–200)
Basophils Relative: 0.9 %
Eosinophils Absolute: 140 cells/uL (ref 15–500)
Eosinophils Relative: 2.5 %
HCT: 38.2 % (ref 35.0–45.0)
Hemoglobin: 12.2 g/dL (ref 11.7–15.5)
Lymphs Abs: 2447 cells/uL (ref 850–3900)
MCH: 26.3 pg — ABNORMAL LOW (ref 27.0–33.0)
MCHC: 31.9 g/dL — ABNORMAL LOW (ref 32.0–36.0)
MCV: 82.3 fL (ref 80.0–100.0)
MPV: 10.1 fL (ref 7.5–12.5)
Monocytes Relative: 10.6 %
Neutro Abs: 2369 cells/uL (ref 1500–7800)
Neutrophils Relative %: 42.3 %
Platelets: 231 10*3/uL (ref 140–400)
RBC: 4.64 10*6/uL (ref 3.80–5.10)
RDW: 13 % (ref 11.0–15.0)
Total Lymphocyte: 43.7 %
WBC: 5.6 10*3/uL (ref 3.8–10.8)

## 2019-10-07 LAB — COMPLETE METABOLIC PANEL WITH GFR
AG Ratio: 1.4 (calc) (ref 1.0–2.5)
ALT: 15 U/L (ref 6–29)
AST: 24 U/L (ref 10–35)
Albumin: 4.1 g/dL (ref 3.6–5.1)
Alkaline phosphatase (APISO): 57 U/L (ref 37–153)
BUN/Creatinine Ratio: 21 (calc) (ref 6–22)
BUN: 22 mg/dL (ref 7–25)
CO2: 28 mmol/L (ref 20–32)
Calcium: 10.2 mg/dL (ref 8.6–10.4)
Chloride: 104 mmol/L (ref 98–110)
Creat: 1.07 mg/dL — ABNORMAL HIGH (ref 0.60–0.88)
GFR, Est African American: 56 mL/min/{1.73_m2} — ABNORMAL LOW (ref >=60)
GFR, Est Non African American: 48 mL/min/{1.73_m2} — ABNORMAL LOW (ref >=60)
Globulin: 2.9 g/dL (calc) (ref 1.9–3.7)
Glucose, Bld: 86 mg/dL (ref 65–99)
Potassium: 5.3 mmol/L (ref 3.5–5.3)
Sodium: 141 mmol/L (ref 135–146)
Total Bilirubin: 0.4 mg/dL (ref 0.2–1.2)
Total Protein: 7 g/dL (ref 6.1–8.1)

## 2019-10-07 NOTE — Progress Notes (Signed)
GFR is low but stable.

## 2019-10-07 NOTE — Telephone Encounter (Signed)
Last Visit: 10/07/19 Next Visit: 03/08/20 Labs: 10/07/19 Creat. 1.07 GFR 48 MCH 26.3 MCHC 31.9   Okay to refill per Dr. Estanislado Pandy

## 2019-10-10 ENCOUNTER — Other Ambulatory Visit: Payer: Self-pay

## 2019-10-11 ENCOUNTER — Ambulatory Visit (INDEPENDENT_AMBULATORY_CARE_PROVIDER_SITE_OTHER): Payer: Medicare HMO | Admitting: Family Medicine

## 2019-10-11 ENCOUNTER — Encounter: Payer: Self-pay | Admitting: Family Medicine

## 2019-10-11 VITALS — BP 122/68 | HR 94 | Temp 97.5°F | Ht 61.0 in | Wt 140.0 lb

## 2019-10-11 DIAGNOSIS — N1832 Chronic kidney disease, stage 3b: Secondary | ICD-10-CM | POA: Diagnosis not present

## 2019-10-11 DIAGNOSIS — E039 Hypothyroidism, unspecified: Secondary | ICD-10-CM | POA: Diagnosis not present

## 2019-10-11 DIAGNOSIS — I10 Essential (primary) hypertension: Secondary | ICD-10-CM | POA: Diagnosis not present

## 2019-10-11 LAB — TSH: TSH: 2.51 u[IU]/mL (ref 0.35–4.50)

## 2019-10-11 NOTE — Progress Notes (Signed)
Established Patient Office Visit  Subjective:  Patient ID: [name] [name], female    DOB: [birth year]  Age: 85 y.o. MRN: [id number]  CC:  Chief Complaint  Patient presents with  . Follow-up    3 month follow up    HPI [facility] presents for follow-up of her hypertension, CKD and hypothyroidism.  Continues taking her Synthroid each morning before eating.  Blood pressure has been well controlled with the Lotensin.  She is having no issues taking it.  BUN and creatinine have been stable and actually have been a little bit lower recently.  She is hydrating well.  She has been having trouble obtaining the Covid vaccine.  She lives alone and does not drive and is dependent on her son for transportation.  Past Medical History:  Diagnosis Date  . HYPERLIPIDEMIA 01/19/2007  . HYPERTENSION 01/19/2007  . HYPOTHYROIDISM 01/19/2007  . Rheumatoid arthritis(714.0) 01/19/2007    Past Surgical History:  Procedure Laterality Date  . ABDOMINAL HYSTERECTOMY    . APPENDECTOMY    . CHOLECYSTECTOMY    . NECK SURGERY      Family History  Problem Relation Age of Onset  . Diabetes Father   . Heart disease Father   . Cancer Sister        breast   . Heart attack Brother   . Heart attack Brother   . Heart attack Brother   . Heart disease Sister     Social History   Socioeconomic History  . Marital status: Married    Spouse name: Not on file  . Number of children: Not on file  . Years of education: Not on file  . Highest education level: Not on file  Occupational History  . Not on file  Tobacco Use  . Smoking status: Never Smoker  . Smokeless tobacco: Never Used  Substance and Sexual Activity  . Alcohol use: No  . Drug use: No  . Sexual activity: Not on file  Other Topics Concern  . Not on file  Social History Narrative  . Not on file   Social Determinants of Health   Financial Resource Strain: Low Risk   . Difficulty of Paying Living Expenses: Not hard at all  Food  Insecurity: No Food Insecurity  . Worried About Char[employer] fund[place] in the Last Year: Never true  . Ran Out of Food in the Last Year: Never true  Transportation Needs: No Transportation Needs  . Lack of Transportation (Medical): No  . Lack of Transportation (Non-Medical): No  Physical Activity:   . Days of Exercise per Week: Not on file  . Minutes of Exercise per Session: Not on file  Stress:   . Feeling of Stress : Not on file  Social Connections:   . Frequency of Communication with Friends and Family: Not on file  . Frequency of Social Gatherings with Friends and Family: Not on file  . Attends Religious Services: Not on file  . Active Member of Clubs or Organizations: Not on file  . Attends Archivist Meetings: Not on file  . Marital Status: Not on file  Intimate Partner Violence:   . Fear of Current or Ex-Partner: Not on file  . Emotionally Abused: Not on file  . Physically Abused: Not on file  . Sexually Abused: Not on file    Outpatient Medications Prior to Visit  Medication Sig Dispense Refill  . benazepril (LOTENSIN) 10 MG tablet TAKE 1 TABLET BY MOUTH EVERY DAY  90 tablet 0  . leflunomide (ARAVA) 20 MG tablet TAKE 1 TABLET BY MOUTH EVERY DAY 90 tablet 0  . levothyroxine (SYNTHROID) 75 MCG tablet Take 1 tablet (75 mcg total) by mouth daily. 90 tablet 3  . Multiple Vitamins-Minerals (MULTIVITAMIN WITH MINERALS) tablet Take 1 tablet by mouth daily.    Vladimir Faster Glycol-Propyl Glycol (SYSTANE OP) Apply to eye daily.     No facility-administered medications prior to visit.    Allergies  Allergen Reactions  . Tramadol Nausea Only  . Codeine Phosphate Nausea Only    REACTION: unspecified    ROS Review of Systems  Constitutional: Negative.   Respiratory: Negative.   Cardiovascular: Negative.   Gastrointestinal: Negative.   Genitourinary: Negative for decreased urine volume, difficulty urinating and frequency.  Neurological: Negative.     Psychiatric/Behavioral: Negative.       Objective:    Physical Exam  Constitutional: She is oriented to person, place, and time. She appears well-developed and well-nourished. No distress.  HENT:  Head: Normocephalic and atraumatic.  Right Ear: External ear normal.  Left Ear: External ear normal.  Eyes: Conjunctivae are normal. Right eye exhibits no discharge. Left eye exhibits no discharge. No scleral icterus.  Neck: No JVD present. No tracheal deviation present.  Cardiovascular: Normal rate, regular rhythm and normal heart sounds.  Pulmonary/Chest: Effort normal and breath sounds normal. No stridor.  Musculoskeletal:        General: No edema.  Neurological: She is alert and oriented to person, place, and time.  Skin: Skin is warm and dry. She is not diaphoretic.  Psychiatric: She has a normal mood and affect. Her behavior is normal.    BP 122/68   Pulse 94   Temp (!) 97.5 F (36.4 C) (Tympanic)   Ht 5\' 1"  (1.549 m)   Wt 140 lb (63.5 kg)   SpO2 95%   BMI 26.45 kg/m  Wt Readings from Last 3 Encounters:  10/11/19 140 lb (63.5 kg)  10/06/19 140 lb 3.2 oz (63.6 kg)  07/12/19 139 lb (63 kg)     There are no preventive care reminders to display for this patient.  There are no preventive care reminders to display for this patient.  Lab Results  Component Value Date   TSH 2.09 05/06/2019   Lab Results  Component Value Date   WBC 5.6 10/06/2019   HGB 12.2 10/06/2019   HCT 38.2 10/06/2019   MCV 82.3 10/06/2019   PLT 231 10/06/2019   Lab Results  Component Value Date   NA 141 10/06/2019   K 5.3 10/06/2019   CO2 28 10/06/2019   GLUCOSE 86 10/06/2019   BUN 22 10/06/2019   CREATININE 1.07 (H) 10/06/2019   BILITOT 0.4 10/06/2019   ALKPHOS 59 07/12/2019   AST 24 10/06/2019   ALT 15 10/06/2019   PROT 7.0 10/06/2019   ALBUMIN 4.1 07/12/2019   CALCIUM 10.2 10/06/2019   GFR 46.51 (L) 07/12/2019   Lab Results  Component Value Date   CHOL 281 (H) 02/03/2019    Lab Results  Component Value Date   HDL 93 02/03/2019   Lab Results  Component Value Date   LDLCALC 156 (H) 02/03/2019   Lab Results  Component Value Date   TRIG 161 (H) 02/03/2019   Lab Results  Component Value Date   CHOLHDL 3.0 02/03/2019   Lab Results  Component Value Date   HGBA1C 5.9 (H) 01/31/2013      Assessment & Plan:   Problem List  Items Addressed This Visit      Cardiovascular and Mediastinum   Essential hypertension - Primary     Endocrine   Hypothyroidism   Relevant Orders   TSH     Genitourinary   Chronic kidney disease (CKD) stage G3b      No orders of the defined types were placed in this encounter.   Follow-up: Return in about 3 months (around 01/11/2020).   She will check with the [facility] pharmacist near her house to see if they can put her on a call list. Libby Maw, MD

## 2019-11-09 ENCOUNTER — Other Ambulatory Visit: Payer: Self-pay | Admitting: Family Medicine

## 2019-11-09 DIAGNOSIS — N1832 Chronic kidney disease, stage 3b: Secondary | ICD-10-CM

## 2019-11-09 DIAGNOSIS — I10 Essential (primary) hypertension: Secondary | ICD-10-CM

## 2019-12-03 DIAGNOSIS — I951 Orthostatic hypotension: Secondary | ICD-10-CM | POA: Diagnosis not present

## 2019-12-03 DIAGNOSIS — R69 Illness, unspecified: Secondary | ICD-10-CM | POA: Diagnosis not present

## 2019-12-03 DIAGNOSIS — Z809 Family history of malignant neoplasm, unspecified: Secondary | ICD-10-CM | POA: Diagnosis not present

## 2019-12-03 DIAGNOSIS — I110 Hypertensive heart disease with heart failure: Secondary | ICD-10-CM | POA: Diagnosis not present

## 2019-12-03 DIAGNOSIS — I509 Heart failure, unspecified: Secondary | ICD-10-CM | POA: Diagnosis not present

## 2019-12-03 DIAGNOSIS — Z7722 Contact with and (suspected) exposure to environmental tobacco smoke (acute) (chronic): Secondary | ICD-10-CM | POA: Diagnosis not present

## 2019-12-03 DIAGNOSIS — Z008 Encounter for other general examination: Secondary | ICD-10-CM | POA: Diagnosis not present

## 2019-12-03 DIAGNOSIS — M069 Rheumatoid arthritis, unspecified: Secondary | ICD-10-CM | POA: Diagnosis not present

## 2019-12-03 DIAGNOSIS — E039 Hypothyroidism, unspecified: Secondary | ICD-10-CM | POA: Diagnosis not present

## 2019-12-30 ENCOUNTER — Other Ambulatory Visit: Payer: Self-pay | Admitting: Rheumatology

## 2019-12-30 NOTE — Telephone Encounter (Signed)
Last Visit: 10/07/19 Next Visit: 03/08/20 Labs: 10/07/19 Creat. 1.07 GFR 48 MCH 26.3 MCHC 31.9   Current Dose per office note on 10/06/2019: Arava 20 mg 1 tablet by mouth daily  Okay to refill per Dr. Estanislado Pandy

## 2020-01-11 ENCOUNTER — Other Ambulatory Visit: Payer: Self-pay

## 2020-01-12 ENCOUNTER — Ambulatory Visit (INDEPENDENT_AMBULATORY_CARE_PROVIDER_SITE_OTHER): Payer: Medicare HMO | Admitting: Family Medicine

## 2020-01-12 ENCOUNTER — Encounter: Payer: Self-pay | Admitting: Family Medicine

## 2020-01-12 VITALS — BP 126/64 | HR 83 | Temp 97.8°F | Ht 61.0 in | Wt 133.4 lb

## 2020-01-12 DIAGNOSIS — E7800 Pure hypercholesterolemia, unspecified: Secondary | ICD-10-CM | POA: Insufficient documentation

## 2020-01-12 DIAGNOSIS — N1832 Chronic kidney disease, stage 3b: Secondary | ICD-10-CM | POA: Diagnosis not present

## 2020-01-12 DIAGNOSIS — I10 Essential (primary) hypertension: Secondary | ICD-10-CM | POA: Diagnosis not present

## 2020-01-12 DIAGNOSIS — E039 Hypothyroidism, unspecified: Secondary | ICD-10-CM

## 2020-01-12 NOTE — Progress Notes (Signed)
Established Patient Office Visit  Subjective:  Patient ID: [name] [name], female    DOB: [birth year]  Age: 85 y.o. MRN: [id number]  CC:  Chief Complaint  Patient presents with   Follow-up    Pt is here today for a 59-month-F/U for HTN, CKD, and Hypothyroidism. She agreed to call [facility] to get on a call list for the Covid vaccine. She is just waiting for them to call her to come get the vaccine. Labs at 3.9.21 visit showed to be stable. She denies the need for any refills.    HPI [name] [name] - [place] presents for continues to do well.  Taking medicines as directed.  Blood pressure controlled well with Benzapril.  Hypothyroidism controlled well with levothyroxine.  Continues to take it on a fasting stomach as directed.  Elevated LDL cholesterol with a highly favorable HDL at 93.  Continues to live alone independently performing all ADLs.  Past Medical History:  Diagnosis Date   HYPERLIPIDEMIA 01/19/2007   HYPERTENSION 01/19/2007   HYPOTHYROIDISM 01/19/2007   Rheumatoid arthritis(714.0) 01/19/2007    Past Surgical History:  Procedure Laterality Date   ABDOMINAL HYSTERECTOMY     APPENDECTOMY     CHOLECYSTECTOMY     NECK SURGERY      Family History  Problem Relation Age of Onset   Diabetes Father    Heart disease Father    Cancer Sister        breast    Heart attack Brother    Heart attack Brother    Heart attack Brother    Heart disease Sister     Social History   Socioeconomic History   Marital status: Married    Spouse name: Not on file   Number of children: Not on file   Years of education: Not on file   Highest education level: Not on file  Occupational History   Not on file  Tobacco Use   Smoking status: Never Smoker   Smokeless tobacco: Never Used  Vaping Use   Vaping Use: Never used  Substance and Sexual Activity   Alcohol use: No   Drug use: No   Sexual activity: Not on file  Other Topics Concern   Not on file  Social History  Narrative   Not on file   Social Determinants of Health   Financial Resource Strain: Low Risk    Difficulty of Paying Living Expenses: Not hard at all  Food Insecurity: No Food Insecurity   Worried About Charity fundraiser in the Last Year: Never true   Ran Out of Food in the Last Year: Never true  Transportation Needs: No Transportation Needs   Lack of Transportation (Medical): No   Lack of Transportation (Non-Medical): No  Physical Activity:    Days of Exercise per Week:    Minutes of Exercise per Session:   Stress:    Feeling of Stress :   Social Connections:    Frequency of Communication with Friends and Family:    Frequency of Social Gatherings with Friends and Family:    Attends Religious Services:    Active Member of Clubs or Organizations:    Attends Music therapist:    Marital Status:   Intimate Partner Violence:    Fear of Current or Ex-Partner:    Emotionally Abused:    Physically Abused:    Sexually Abused:     Outpatient Medications Prior to Visit  Medication Sig Dispense Refill   benazepril (LOTENSIN) 10 MG tablet  TAKE 1 TABLET BY MOUTH EVERY DAY 90 tablet 0   leflunomide (ARAVA) 20 MG tablet TAKE 1 TABLET BY MOUTH EVERY DAY 90 tablet 0   levothyroxine (SYNTHROID) 75 MCG tablet Take 1 tablet (75 mcg total) by mouth daily. 90 tablet 3   Multiple Vitamins-Minerals (MULTIVITAMIN WITH MINERALS) tablet Take 1 tablet by mouth daily.     Polyethyl Glycol-Propyl Glycol (SYSTANE OP) Apply to eye daily.     No facility-administered medications prior to visit.    Allergies  Allergen Reactions   Tramadol Nausea Only   Codeine Phosphate Nausea Only    REACTION: unspecified    ROS Review of Systems  Constitutional: Negative.   HENT: Negative.   Respiratory: Negative.   Cardiovascular: Negative.   Gastrointestinal: Negative.   Endocrine: Negative for cold intolerance and heat intolerance.  Neurological: Negative for  light-headedness and headaches.  Hematological: Negative.   Psychiatric/Behavioral: Negative.       Objective:    Physical Exam Constitutional:      General: She is not in acute distress.    Appearance: Normal appearance. She is normal weight. She is not ill-appearing, toxic-appearing or diaphoretic.  HENT:     Head: Normocephalic and atraumatic.     Mouth/Throat:     Mouth: Mucous membranes are moist.     Pharynx: Oropharynx is clear.  Eyes:     General: No scleral icterus.       Right eye: No discharge.        Left eye: No discharge.     Extraocular Movements: Extraocular movements intact.     Pupils: Pupils are equal, round, and reactive to light.  Cardiovascular:     Rate and Rhythm: Normal rate and regular rhythm.  Pulmonary:     Effort: Pulmonary effort is normal.     Breath sounds: Normal breath sounds.  Abdominal:     General: Bowel sounds are normal.  Musculoskeletal:     Cervical back: No rigidity or tenderness.  Lymphadenopathy:     Cervical: No cervical adenopathy.  Skin:    General: Skin is warm and dry.  Neurological:     Mental Status: She is alert.  Psychiatric:        Mood and Affect: Mood normal.        Behavior: Behavior normal.     BP 126/64 (BP Location: Left Arm, Patient Position: Sitting, Cuff Size: Normal)    Pulse 83    Temp 97.8 F (36.6 C) (Temporal)    Ht 5\' 1"  (1.549 m)    Wt 133 lb 6.4 oz (60.5 kg)    SpO2 96%    BMI 25.21 kg/m  Wt Readings from Last 3 Encounters:  01/12/20 133 lb 6.4 oz (60.5 kg)  10/11/19 140 lb (63.5 kg)  10/06/19 140 lb 3.2 oz (63.6 kg)     Health Maintenance Due  Topic Date Due   COVID-19 Vaccine (1) Never done    There are no preventive care reminders to display for this patient.  Lab Results  Component Value Date   TSH 2.51 10/11/2019   Lab Results  Component Value Date   WBC 5.6 10/06/2019   HGB 12.2 10/06/2019   HCT 38.2 10/06/2019   MCV 82.3 10/06/2019   PLT 231 10/06/2019   Lab Results    Component Value Date   NA 141 10/06/2019   K 5.3 10/06/2019   CO2 28 10/06/2019   GLUCOSE 86 10/06/2019   BUN 22 10/06/2019   CREATININE  1.07 (H) 10/06/2019   BILITOT 0.4 10/06/2019   ALKPHOS 59 07/12/2019   AST 24 10/06/2019   ALT 15 10/06/2019   PROT 7.0 10/06/2019   ALBUMIN 4.1 07/12/2019   CALCIUM 10.2 10/06/2019   GFR 46.51 (L) 07/12/2019   Lab Results  Component Value Date   CHOL 281 (H) 02/03/2019   Lab Results  Component Value Date   HDL 93 02/03/2019   Lab Results  Component Value Date   LDLCALC 156 (H) 02/03/2019   Lab Results  Component Value Date   TRIG 161 (H) 02/03/2019   Lab Results  Component Value Date   CHOLHDL 3.0 02/03/2019   Lab Results  Component Value Date   HGBA1C 5.9 (H) 01/31/2013      Assessment & Plan:   Problem List Items Addressed This Visit      Cardiovascular and Mediastinum   Essential hypertension - Primary   Relevant Orders   Basic metabolic panel   CBC     Endocrine   Hypothyroidism     Genitourinary   Chronic kidney disease (CKD) stage G3b   Relevant Orders   Basic metabolic panel     Other   Elevated LDL cholesterol level   Relevant Orders   Lipid panel      No orders of the defined types were placed in this encounter.   Follow-up: Return in about 3 months (around 04/13/2020), or [phone] for Covid Vaccine.Libby Maw, MD

## 2020-01-31 ENCOUNTER — Other Ambulatory Visit: Payer: Self-pay | Admitting: Family Medicine

## 2020-01-31 DIAGNOSIS — N1832 Chronic kidney disease, stage 3b: Secondary | ICD-10-CM

## 2020-01-31 DIAGNOSIS — I10 Essential (primary) hypertension: Secondary | ICD-10-CM

## 2020-02-01 ENCOUNTER — Other Ambulatory Visit: Payer: Self-pay | Admitting: Family Medicine

## 2020-02-01 DIAGNOSIS — E039 Hypothyroidism, unspecified: Secondary | ICD-10-CM

## 2020-02-24 NOTE — Progress Notes (Signed)
Office Visit Note  Patient: [name] [name]             Date of Birth: 1935-04-25           MRN: [id number]             PCP: Libby Maw, MD Referring: Libby Maw,* Visit Date: 03/08/2020 Occupation: @GUAROCC @  Subjective:  Medication monitoring   History of Present Illness: [name] [name] is a 85 y.o. female with history of seropositive rheumatoid arthritis.  Patient is taking Arava 20 mg 1 tablet by mouth daily. She denies any recent rheumatoid arthritis flares.  She denies any joint pain, joint swelling, or morning stiffness.  She denies any new concerns at this time.  She has not had any recent infections.  She has not received the COVID-19 vaccination.  She states that she has been trying to stay home as much as she can and avoid direct contact with people.  Activities of Daily Living:  Patient reports morning stiffness for 0 minutes.   Patient Denies nocturnal pain.  Difficulty dressing/grooming: Denies Difficulty climbing stairs: Denies Difficulty getting out of chair: Denies Difficulty using hands for taps, buttons, cutlery, and/or writing: Denies  Review of Systems  Constitutional: Negative for fatigue.  HENT: Negative for mouth sores, mouth dryness and nose dryness.   Eyes: Negative for itching and dryness.  Respiratory: Negative for shortness of breath and difficulty breathing.   Cardiovascular: Negative for chest pain and palpitations.  Gastrointestinal: Negative for blood in stool, constipation and diarrhea.  Endocrine: Negative for increased urination.  Genitourinary: Negative for difficulty urinating.  Musculoskeletal: Negative for arthralgias, joint pain, joint swelling, myalgias, morning stiffness, muscle tenderness and myalgias.  Skin: Negative for color change, rash and redness.  Allergic/Immunologic: Negative for susceptible to infections.  Neurological: Negative for dizziness, numbness, headaches, memory loss and weakness.    Hematological: Negative for bruising/bleeding tendency.  Psychiatric/Behavioral: Negative for confusion.    PMFS History:  Patient Active Problem List   Diagnosis Date Noted  . Elevated LDL cholesterol level 01/12/2020  . Hypercalcemia 06/06/2019  . DDD (degenerative disc disease), cervical 07/13/2018  . Chronic kidney disease (CKD) stage G3b 01/21/2017  . History of hypothyroidism 01/20/2017  . History of congestive heart failure 08/26/2016  . Osteopenia of multiple sites 08/25/2016  . DDD cervical spine 08/25/2016  . Primary osteoarthritis of both hands 08/25/2016  . High risk medication use 06/11/2016  . CHF, acute ([place]) 02/01/2013  . 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 Hypothyroidism 01/19/2007  . Dyslipidemia 01/19/2007  . Essential hypertension 01/19/2007  . Rheumatoid arthritis ([place]) 01/19/2007    Past Medical History:  Diagnosis Date  . HYPERLIPIDEMIA 01/19/2007  . HYPERTENSION 01/19/2007  . HYPOTHYROIDISM 01/19/2007  . Rheumatoid arthritis(714.0) 01/19/2007    Family History  Problem Relation Age of Onset  . Diabetes Father   . Heart disease Father   . Cancer Sister        breast   . Heart attack Brother   . Heart attack Brother   . Heart attack Brother   . Heart disease Sister    Past Surgical History:  Procedure Laterality Date  . ABDOMINAL HYSTERECTOMY    . APPENDECTOMY    . CHOLECYSTECTOMY    . NECK SURGERY     Social History   Social History Narrative  . Not on file   Immunization History  Administered Date(s) Administered  . Fluad Quad(high Dose 65+) 05/06/2019  . Influenza,inj,Quad PF,6+ Mos 07/06/2013  .  Pneumococcal Conjugate-13 07/06/2013  . Pneumococcal Polysaccharide-23 04/09/2011, 06/21/2012  . Tdap 04/09/2011     Objective: Vital Signs: BP 130/75 (BP Location: Left Arm, Patient Position: Sitting, Cuff Size: Normal)   Pulse 83   Resp 13   Ht 5\' 1"  (1.549 m)   Wt 134 lb (60.8 kg)   BMI 25.32 kg/m    Physical Exam Vitals and  nursing note reviewed.  Constitutional:      Appearance: She is well-developed.  HENT:     Head: Normocephalic and atraumatic.  Eyes:     Conjunctiva/sclera: Conjunctivae normal.  Pulmonary:     Effort: Pulmonary effort is normal.  Abdominal:     General: Bowel sounds are normal.     Palpations: Abdomen is soft.  Musculoskeletal:     Cervical back: Normal range of motion.  Lymphadenopathy:     Cervical: No cervical adenopathy.  Skin:    General: Skin is warm and dry.     Capillary Refill: Capillary refill takes less than 2 seconds.  Neurological:     Mental Status: She is alert and oriented to person, place, and time.  Psychiatric:        Behavior: Behavior normal.      Musculoskeletal Exam: C-spine, thoracic spine, lumbar spine have good range of motion.  Shoulder joints, elbow joints, wrist joints, MCPs, PIPs, and DIPs have good range of motion with no synovitis.  She has complete fist formation bilaterally.  Rheumatoid nodule on the right third proximal phalanx.  Hip joints have slightly limited range of motion bilaterally, right greater than left.  Knee joints have good range of motion with no warmth or effusion.  Ankle joints have good range of motion with no tenderness or inflammation.  She has pedal edema bilaterally.  No tenderness of MTP joints.  CDAI Exam: CDAI Score: 0.2  Patient Global: 1 mm; Provider Global: 1 mm Swollen: 0 ; Tender: 0  Joint Exam 03/08/2020   No joint exam has been documented for this visit   There is currently no information documented on the homunculus. Go to the Rheumatology activity and complete the homunculus joint exam.  Investigation: No additional findings.  Imaging: No results found.  Recent Labs: Lab Results  Component Value Date   WBC 5.6 10/06/2019   HGB 12.2 10/06/2019   PLT 231 10/06/2019   NA 141 10/06/2019   K 5.3 10/06/2019   CL 104 10/06/2019   CO2 28 10/06/2019   GLUCOSE 86 10/06/2019   BUN 22 10/06/2019    CREATININE 1.07 (H) 10/06/2019   BILITOT 0.4 10/06/2019   ALKPHOS 59 07/12/2019   AST 24 10/06/2019   ALT 15 10/06/2019   PROT 7.0 10/06/2019   ALBUMIN 4.1 07/12/2019   CALCIUM 10.2 10/06/2019   GFRAA 56 (L) 10/06/2019    Speciality Comments: PLQ Eye Exam: 10/04/2019 @ [facility]  Prior therapy: Methotrexate (lung injury)   Procedures:  No procedures performed Allergies: Tramadol and Codeine phosphate   Assessment / Plan:     Visit Diagnoses: Rheumatoid arthritis involving multiple sites with positive rheumatoid factor ([place]) -She has no tenderness or synovitis on exam.  She has not had any recent rheumatoid arthritis flares.  She is clinically doing well on Arava 20 mg 1 tablet by mouth daily.  She is tolerating Arava without any side effects.  She has not had any recent infections.  She is not experiencing any joint pain, joint swelling, or morning stiffness at this time.  She has not  had any nocturnal pain or difficulty with ADLs.  She has no new concerns at this time.  X-rays of both hands and feet were updated today to assess for radiographic progression.  She will continue taking Arava 20 mg 1 tablet by mouth daily.  She does not need a refill at this time.  She was advised to notify us if she develops increased joint pain or joint swelling.  She will follow-up in the office in 5 months.  Plan: XR Hand 2 View Right, XR Hand 2 View Left, XR Foot 2 Views Right, XR Foot 2 Views Left x-rays were consistent with erosive rheumatoid arthritis and osteoarthritis.  There was no radiographic progression except for right third MCP showed increased narrowing.  High risk medication use - Arava 20 mg 1 tablet by mouth daily.  CBC and CMP were drawn on 10/06/2019.  She is overdue to update lab work.  Orders for CBC and CMP were released today.  She will be due to update lab work again in November and every 3 months to monitor for drug toxicity.  Standing orders for CBC and CMP are in place.- Plan:  CBC with Differential/Platelet, COMPLETE METABOLIC PANEL WITH GFR  She has not had any recent infections.  She has not received the COVID-19 vaccination.  We discussed that ACR strongly encourages receiving the COVID-19 vaccination as well as continuing to wear a mask and social distancing.  We discussed that ACR does not recommend any dose change of Arava if she does decide to proceed with the vaccine but if she is clinically doing well she can make the personal choice to hold [place] after taking the vaccine.  She was advised to notify us if she develops the COVID-19 infection in order to receive the antibody infusion.  She voiced understanding.  Primary osteoarthritis of both hands: She is not having any discomfort in her hands at this time.  No morning stiffness.  She has no tenderness or inflammation on exam.  She has complete fist formation bilaterally.  Joint protection and muscle strengthening were discussed.  DDD (degenerative disc disease), cervical: She has good range of motion with no discomfort at this time.  She is not experiencing any neck stiffness or symptoms of radiculopathy currently.  Osteopenia of multiple sites - Future order for DEXA is in place.   Other medical conditions are listed as follows:  Dyslipidemia  History of hypertension  Chronic kidney disease (CKD) stage G3b  History of hypothyroidism  History of congestive heart failure  Orders: Orders Placed This Encounter  Procedures  . XR Hand 2 View Right  . XR Hand 2 View Left  . XR Foot 2 Views Right  . XR Foot 2 Views Left  . CBC with Differential/Platelet  . COMPLETE METABOLIC PANEL WITH GFR   No orders of the defined types were placed in this encounter.     Follow-Up Instructions: Return in about 5 months (around 08/08/2020) for Rheumatoid arthritis.   Ofilia Neas, PA-C  Note - This record has been created using Dragon software.  Chart creation errors have been sought, but may not always  have  been located. Such creation errors do not reflect on  the standard of medical care.

## 2020-03-08 ENCOUNTER — Other Ambulatory Visit: Payer: Self-pay

## 2020-03-08 ENCOUNTER — Ambulatory Visit: Payer: Self-pay

## 2020-03-08 ENCOUNTER — Ambulatory Visit: Payer: Medicare HMO | Admitting: Physician Assistant

## 2020-03-08 ENCOUNTER — Encounter: Payer: Self-pay | Admitting: Physician Assistant

## 2020-03-08 VITALS — BP 130/75 | HR 83 | Resp 13 | Ht 61.0 in | Wt 134.0 lb

## 2020-03-08 DIAGNOSIS — M19041 Primary osteoarthritis, right hand: Secondary | ICD-10-CM

## 2020-03-08 DIAGNOSIS — M5030 Other cervical disc degeneration, unspecified cervical region: Secondary | ICD-10-CM | POA: Diagnosis not present

## 2020-03-08 DIAGNOSIS — M0579 Rheumatoid arthritis with rheumatoid factor of multiple sites without organ or systems involvement: Secondary | ICD-10-CM

## 2020-03-08 DIAGNOSIS — Z8679 Personal history of other diseases of the circulatory system: Secondary | ICD-10-CM

## 2020-03-08 DIAGNOSIS — M19042 Primary osteoarthritis, left hand: Secondary | ICD-10-CM

## 2020-03-08 DIAGNOSIS — M8589 Other specified disorders of bone density and structure, multiple sites: Secondary | ICD-10-CM | POA: Diagnosis not present

## 2020-03-08 DIAGNOSIS — E785 Hyperlipidemia, unspecified: Secondary | ICD-10-CM

## 2020-03-08 DIAGNOSIS — Z8639 Personal history of other endocrine, nutritional and metabolic disease: Secondary | ICD-10-CM

## 2020-03-08 DIAGNOSIS — Z79899 Other long term (current) drug therapy: Secondary | ICD-10-CM | POA: Diagnosis not present

## 2020-03-08 DIAGNOSIS — M79671 Pain in right foot: Secondary | ICD-10-CM | POA: Diagnosis not present

## 2020-03-08 DIAGNOSIS — M79672 Pain in left foot: Secondary | ICD-10-CM | POA: Diagnosis not present

## 2020-03-08 DIAGNOSIS — N1832 Chronic kidney disease, stage 3b: Secondary | ICD-10-CM

## 2020-03-08 LAB — COMPLETE METABOLIC PANEL WITH GFR
AG Ratio: 1.4 (calc) (ref 1.0–2.5)
ALT: 17 U/L (ref 6–29)
AST: 26 U/L (ref 10–35)
Albumin: 4.2 g/dL (ref 3.6–5.1)
Alkaline phosphatase (APISO): 69 U/L (ref 37–153)
BUN/Creatinine Ratio: 21 (calc) (ref 6–22)
BUN: 23 mg/dL (ref 7–25)
CO2: 27 mmol/L (ref 20–32)
Calcium: 9.9 mg/dL (ref 8.6–10.4)
Chloride: 104 mmol/L (ref 98–110)
Creat: 1.08 mg/dL — ABNORMAL HIGH (ref 0.60–0.88)
GFR, Est African American: 55 mL/min/{1.73_m2} — ABNORMAL LOW (ref >=60)
GFR, Est Non African American: 47 mL/min/{1.73_m2} — ABNORMAL LOW (ref >=60)
Globulin: 3.1 g/dL (calc) (ref 1.9–3.7)
Glucose, Bld: 97 mg/dL (ref 65–99)
Potassium: 4.8 mmol/L (ref 3.5–5.3)
Sodium: 141 mmol/L (ref 135–146)
Total Bilirubin: 0.5 mg/dL (ref 0.2–1.2)
Total Protein: 7.3 g/dL (ref 6.1–8.1)

## 2020-03-08 LAB — CBC WITH DIFFERENTIAL/PLATELET
Absolute Monocytes: 648 cells/uL (ref 200–950)
Basophils Absolute: 41 cells/uL (ref 0–200)
Basophils Relative: 0.8 %
Eosinophils Absolute: 71 cells/uL (ref 15–500)
Eosinophils Relative: 1.4 %
HCT: 40.3 % (ref 35.0–45.0)
Hemoglobin: 12.5 g/dL (ref 11.7–15.5)
Lymphs Abs: 2978 cells/uL (ref 850–3900)
MCH: 26 pg — ABNORMAL LOW (ref 27.0–33.0)
MCHC: 31 g/dL — ABNORMAL LOW (ref 32.0–36.0)
MCV: 84 fL (ref 80.0–100.0)
MPV: 10.4 fL (ref 7.5–12.5)
Monocytes Relative: 12.7 %
Neutro Abs: 1362 cells/uL — ABNORMAL LOW (ref 1500–7800)
Neutrophils Relative %: 26.7 %
Platelets: 250 10*3/uL (ref 140–400)
RBC: 4.8 10*6/uL (ref 3.80–5.10)
RDW: 13 % (ref 11.0–15.0)
Total Lymphocyte: 58.4 %
WBC: 5.1 10*3/uL (ref 3.8–10.8)

## 2020-03-08 NOTE — Patient Instructions (Addendum)
COVID-19 vaccine recommendations:  ° °COVID-19 vaccine is recommended for everyone (unless you are allergic to a vaccine component), even if you are on a medication that suppresses your immune system.  ° °If you are on Methotrexate, Cellcept (mycophenolate), Rinvoq, Xeljanz, and Olumiant- hold the medication for 1 week after each vaccine. Hold Methotrexate for 2 weeks after the single dose COVID-19 vaccine.  ° °If you are on Orencia subcutaneous injection - hold medication one week prior to and one week after the first COVID-19 vaccine dose (only).  ° °If you are on Orencia IV infusions- time vaccination administration so that the first COVID-19 vaccination will occur four weeks after the infusion and postpone the subsequent infusion by one week.  ° °If you are on Cyclophosphamide or Rituxan infusions please contact your doctor prior to receiving the COVID-19 vaccine.  ° °Do not take Tylenol or ant anti-inflammatory medications (NSAIDs) 24 hours prior to the COVID-19 vaccination.  ° °There is no direct evidence about the efficacy of the COVID-19 vaccine in individuals who are on medications that suppress the immune system.  ° °Even if you are fully vaccinated, and you are on any medications that suppress your immune system, please continue to wear a mask, maintain at least six feet social distance and practice hand hygiene.  ° °If you develop a COVID-19 infection, please contact your PCP or our office to determine if you need antibody infusion. ° °We anticipate that a booster vaccine will be available soon for immunosuppressed individuals. Please cal our office before receiving your booster dose to make adjustments to your medication regimen.  ° °Standing Labs °We placed an order today for your standing lab work.  ° °Please have your standing labs drawn in November and every 3 months ° °If possible, please have your labs drawn 2 weeks prior to your appointment so that the provider can discuss your results at your  appointment. ° °We have open lab daily °Monday through Thursday from 8:30-12:30 PM and 1:30-4:30 PM and Friday from 8:30-12:30 PM and 1:30-4:00 PM °at the office of Dr. Shaili Deveshwar, [facility].   °Please be advised, patients with office appointments requiring lab work will take precedents over walk-in lab work.  °If possible, please come for your lab work on Monday and Friday afternoons, as you may experience shorter wait times. °The office is located at [address], [address], [place], [place] [address] °No appointment is necessary.   °Labs are drawn by [facility]. Please bring your co-pay at the time of your lab draw.  You may receive a bill from [facility] for your lab work. ° °If you wish to have your labs drawn at another location, please call the office 24 hours in advance to send orders. ° °If you have any questions regarding directions or hours of operation,  °please call [phone].   °As a reminder, please drink plenty of water prior to coming for your lab work. Thanks! ° °

## 2020-03-09 NOTE — Progress Notes (Signed)
Absolute neutrophils are borderline low. WBC count is WNL.  MCH and MCHC are low but stable.  Creatinine is borderline elevated and GFR is 47.  Please advise the patient to avoid NSAIDs.   Ok to continue taking Arava as prescribed.

## 2020-03-26 ENCOUNTER — Other Ambulatory Visit: Payer: Self-pay | Admitting: Rheumatology

## 2020-03-26 NOTE — Telephone Encounter (Signed)
Last Visit: 03/08/2020 Next Visit: 08/09/2020 Labs: 03/08/2020 Absolute neutrophils are borderline low. WBC count is WNL. MCH and MCHC are low but stable. Creatinine is borderline elevated and GFR is 47.   Current Dose per office note 03/08/2020: Arava 20 mg 1 tablet by mouth daily DX: Rheumatoid arthritis involving multiple sites with positive rheumatoid factor   Okay to refill Arava?

## 2020-04-03 DIAGNOSIS — I129 Hypertensive chronic kidney disease with stage 1 through stage 4 chronic kidney disease, or unspecified chronic kidney disease: Secondary | ICD-10-CM | POA: Diagnosis not present

## 2020-04-03 DIAGNOSIS — E039 Hypothyroidism, unspecified: Secondary | ICD-10-CM | POA: Diagnosis not present

## 2020-04-03 DIAGNOSIS — N1831 Chronic kidney disease, stage 3a: Secondary | ICD-10-CM | POA: Diagnosis not present

## 2020-04-03 DIAGNOSIS — M069 Rheumatoid arthritis, unspecified: Secondary | ICD-10-CM | POA: Diagnosis not present

## 2020-04-03 DIAGNOSIS — I509 Heart failure, unspecified: Secondary | ICD-10-CM | POA: Diagnosis not present

## 2020-04-10 DIAGNOSIS — H3561 Retinal hemorrhage, right eye: Secondary | ICD-10-CM | POA: Diagnosis not present

## 2020-04-10 DIAGNOSIS — M069 Rheumatoid arthritis, unspecified: Secondary | ICD-10-CM | POA: Diagnosis not present

## 2020-04-10 DIAGNOSIS — H35373 Puckering of macula, bilateral: Secondary | ICD-10-CM | POA: Diagnosis not present

## 2020-04-10 DIAGNOSIS — H04123 Dry eye syndrome of bilateral lacrimal glands: Secondary | ICD-10-CM | POA: Diagnosis not present

## 2020-04-10 DIAGNOSIS — Z79899 Other long term (current) drug therapy: Secondary | ICD-10-CM | POA: Diagnosis not present

## 2020-04-16 ENCOUNTER — Other Ambulatory Visit: Payer: Self-pay

## 2020-04-16 ENCOUNTER — Ambulatory Visit (INDEPENDENT_AMBULATORY_CARE_PROVIDER_SITE_OTHER): Payer: Medicare HMO | Admitting: Family Medicine

## 2020-04-16 ENCOUNTER — Encounter: Payer: Self-pay | Admitting: Family Medicine

## 2020-04-16 VITALS — BP 120/76 | HR 85 | Temp 97.8°F | Ht 61.0 in | Wt 133.8 lb

## 2020-04-16 DIAGNOSIS — I10 Essential (primary) hypertension: Secondary | ICD-10-CM

## 2020-04-16 DIAGNOSIS — N1832 Chronic kidney disease, stage 3b: Secondary | ICD-10-CM

## 2020-04-16 DIAGNOSIS — E039 Hypothyroidism, unspecified: Secondary | ICD-10-CM | POA: Diagnosis not present

## 2020-04-16 LAB — TSH: TSH: 1.91 u[IU]/mL (ref 0.35–4.50)

## 2020-04-16 NOTE — Progress Notes (Signed)
Established Patient Office Visit  Subjective:  Patient ID: [name] [name], female    DOB: 01-Oct-1934  Age: 85 y.o. MRN: [id number]  CC:  Chief Complaint  Patient presents with  . Follow-up    3 month follow up no concerns.     HPI [name] [name] presents for follow-up of her hypertension, hypothyroidism and CKD.  CKD has been stable and blood pressures well controlled with Benzapril.  Lab work recently drawn at her rheumatologist office was reviewed.  Lipid profile shows a highly favorable HDL with a elevated LDL.  Her prior doctor discontinued statin therapy.  She is not interested in continuing it.  Continues to take levothyroxine as directed.  She has not obtained her Covid vaccine as of yet.  Past Medical History:  Diagnosis Date  . HYPERLIPIDEMIA 01/19/2007  . HYPERTENSION 01/19/2007  . HYPOTHYROIDISM 01/19/2007  . Rheumatoid arthritis(714.0) 01/19/2007    Past Surgical History:  Procedure Laterality Date  . ABDOMINAL HYSTERECTOMY    . APPENDECTOMY    . CHOLECYSTECTOMY    . NECK SURGERY      Family History  Problem Relation Age of Onset  . Diabetes Father   . Heart disease Father   . Cancer Sister        breast   . Heart attack Brother   . Heart attack Brother   . Heart attack Brother   . Heart disease Sister     Social History   Socioeconomic History  . Marital status: Married    Spouse name: Not on file  . Number of children: Not on file  . Years of education: Not on file  . Highest education level: Not on file  Occupational History  . Not on file  Tobacco Use  . Smoking status: Never Smoker  . Smokeless tobacco: Never Used  Vaping Use  . Vaping Use: Never used  Substance and Sexual Activity  . Alcohol use: No  . Drug use: No  . Sexual activity: Not on file  Other Topics Concern  . Not on file  Social History Narrative  . Not on file   Social Determinants of Health   Financial Resource Strain: Low Risk   . Difficulty of Paying Living  Expenses: Not hard at all  Food Insecurity: No Food Insecurity  . Worried About Charity fundraiser in the Last Year: Never true  . Ran Out of Food in the Last Year: Never true  Transportation Needs: No Transportation Needs  . Lack of Transportation (Medical): No  . Lack of Transportation (Non-Medical): No  Physical Activity:   . Days of Exercise per Week: Not on file  . Minutes of Exercise per Session: Not on file  Stress:   . Feeling of Stress : Not on file  Social Connections:   . Frequency of Communication with Friends and Family: Not on file  . Frequency of Social Gatherings with Friends and Family: Not on file  . Attends Religious Services: Not on file  . Active Member of Clubs or Organizations: Not on file  . Attends Archivist Meetings: Not on file  . Marital Status: Not on file  Intimate Partner Violence:   . Fear of Current or Ex-Partner: Not on file  . Emotionally Abused: Not on file  . Physically Abused: Not on file  . Sexually Abused: Not on file    Outpatient Medications Prior to Visit  Medication Sig Dispense Refill  . benazepril (LOTENSIN) 10 MG tablet TAKE  1 TABLET BY MOUTH EVERY DAY 90 tablet 0  . leflunomide (ARAVA) 20 MG tablet TAKE 1 TABLET BY MOUTH EVERY DAY 90 tablet 0  . levothyroxine (SYNTHROID) 75 MCG tablet TAKE 1 TABLET BY MOUTH EVERY DAY 90 tablet 3  . Multiple Vitamins-Minerals (MULTIVITAMIN WITH MINERALS) tablet Take 1 tablet by mouth daily.    Vladimir Faster Glycol-Propyl Glycol (SYSTANE OP) Apply to eye daily.     No facility-administered medications prior to visit.    Allergies  Allergen Reactions  . Tramadol Nausea Only  . Codeine Phosphate Nausea Only    REACTION: unspecified    ROS Review of Systems  Constitutional: Negative.   HENT: Negative.   Eyes: Negative for photophobia and visual disturbance.  Respiratory: Negative.   Cardiovascular: Negative.   Gastrointestinal: Negative.   Endocrine: Negative for polyphagia and  polyuria.  Genitourinary: Negative.   Musculoskeletal: Negative for gait problem and joint swelling.  Skin: Negative for pallor and rash.  Allergic/Immunologic: Negative for immunocompromised state.  Neurological: Negative for light-headedness and numbness.  Hematological: Does not bruise/bleed easily.  Psychiatric/Behavioral: Negative.       Objective:    Physical Exam Vitals and nursing note reviewed.  Constitutional:      General: She is not in acute distress.    Appearance: Normal appearance. She is not ill-appearing, toxic-appearing or diaphoretic.  HENT:     Head: Normocephalic and atraumatic.     Right Ear: External ear normal.     Left Ear: External ear normal.  Eyes:     General: No scleral icterus.       Right eye: No discharge.        Left eye: No discharge.     Extraocular Movements: Extraocular movements intact.     Conjunctiva/sclera: Conjunctivae normal.     Pupils: Pupils are equal, round, and reactive to light.  Cardiovascular:     Rate and Rhythm: Normal rate and regular rhythm.     Heart sounds: Murmur heard.   Pulmonary:     Effort: Pulmonary effort is normal.     Breath sounds: Normal breath sounds.  Abdominal:     General: Bowel sounds are normal.  Musculoskeletal:        General: Swelling present.     Right lower leg: No edema.     Left lower leg: No edema.  Skin:    General: Skin is warm and dry.  Neurological:     Mental Status: She is alert and oriented to person, place, and time.  Psychiatric:        Mood and Affect: Mood normal.        Behavior: Behavior normal.     BP 120/76   Pulse 85   Temp 97.8 F (36.6 C) (Tympanic)   Ht 5\' 1"  (1.549 m)   Wt 133 lb 12.8 oz (60.7 kg)   SpO2 95%   BMI 25.28 kg/m  Wt Readings from Last 3 Encounters:  04/16/20 133 lb 12.8 oz (60.7 kg)  03/08/20 134 lb (60.8 kg)  01/12/20 133 lb 6.4 oz (60.5 kg)     Health Maintenance Due  Topic Date Due  . COVID-19 Vaccine (1) Never done  . INFLUENZA  VACCINE  03/04/2020    There are no preventive care reminders to display for this patient.  Lab Results  Component Value Date   TSH 2.51 10/11/2019   Lab Results  Component Value Date   WBC 5.1 03/08/2020   HGB 12.5 03/08/2020  HCT 40.3 03/08/2020   MCV 84.0 03/08/2020   PLT 250 03/08/2020   Lab Results  Component Value Date   NA 141 03/08/2020   K 4.8 03/08/2020   CO2 27 03/08/2020   GLUCOSE 97 03/08/2020   BUN 23 03/08/2020   CREATININE 1.08 (H) 03/08/2020   BILITOT 0.5 03/08/2020   ALKPHOS 59 07/12/2019   AST 26 03/08/2020   ALT 17 03/08/2020   PROT 7.3 03/08/2020   ALBUMIN 4.1 07/12/2019   CALCIUM 9.9 03/08/2020   GFR 46.51 (L) 07/12/2019   Lab Results  Component Value Date   CHOL 281 (H) 02/03/2019   Lab Results  Component Value Date   HDL 93 02/03/2019   Lab Results  Component Value Date   LDLCALC 156 (H) 02/03/2019   Lab Results  Component Value Date   TRIG 161 (H) 02/03/2019   Lab Results  Component Value Date   CHOLHDL 3.0 02/03/2019   Lab Results  Component Value Date   HGBA1C 5.9 (H) 01/31/2013      Assessment & Plan:   Problem List Items Addressed This Visit      Cardiovascular and Mediastinum   Essential hypertension - Primary     Endocrine   Hypothyroidism   Relevant Orders   TSH     Genitourinary   Chronic kidney disease (CKD) stage G3b      No orders of the defined types were placed in this encounter.   Follow-up: Return in about 6 months (around 10/14/2020).  We discussed my recommendation that she have the Covid vaccine and encouraged her to do so. Libby Maw, MD

## 2020-04-24 ENCOUNTER — Other Ambulatory Visit: Payer: Self-pay | Admitting: Family Medicine

## 2020-04-24 DIAGNOSIS — N1832 Chronic kidney disease, stage 3b: Secondary | ICD-10-CM

## 2020-04-24 DIAGNOSIS — I10 Essential (primary) hypertension: Secondary | ICD-10-CM

## 2020-06-19 ENCOUNTER — Other Ambulatory Visit: Payer: Self-pay | Admitting: Rheumatology

## 2020-06-19 NOTE — Telephone Encounter (Signed)
LMOM, Her labs are due now.  I will send a prescription for 30 days only.

## 2020-06-19 NOTE — Telephone Encounter (Signed)
Her labs are due now.  I will send a prescription for 30 days only.

## 2020-07-11 ENCOUNTER — Other Ambulatory Visit: Payer: Self-pay | Admitting: Rheumatology

## 2020-07-11 NOTE — Telephone Encounter (Signed)
Last Visit: 03/08/2020 Next Visit: 08/09/2020 Labs: 03/08/2020 Absolute neutrophils are borderline low. WBC count is WNL. MCH and MCHC are low but stable. Creatinine is borderline elevated and GFR is 47.   Current Dose per office note 03/08/2020: Arava 20 mg 1 tablet by mouth daily DX: Rheumatoid arthritis involving multiple sites with positive rheumatoid factor   Left message to advise patient she is due to update labs.   Okay to refill 30 day supply Arava?

## 2020-07-26 NOTE — Progress Notes (Signed)
Office Visit Note  Patient: [name] [name]             Date of Birth: 03-03-1935           MRN: [id number]             PCP: Mliss Sax, MD Referring: Mliss Sax,* Visit Date: 08/09/2020 Occupation: @GUAROCC @  Subjective:  Medication monitoring   History of Present Illness: [name] [name] is a 85 y.o. female with history of seropositive rheumatoid arthritis, osteoarthritis, and DDD. She is taking arava 20 mg 1 tablet by mouth daily. She is tolerating Arava without any side effects.  She has not missed any doses of arava recently.  She denies any recent rheumatoid arthritis flares. She denies any joint pain or joint swelling at this time.  She had some discomfort on the lateral aspect of the right hip last week which lasted several days and resolved on its own.  She denies any groin pain.   She denies any recent infections.  She is planning on receiving the covid-19 vaccine soon.    Activities of Daily Living:  Patient reports morning stiffness for 0  minutes.   Patient Denies nocturnal pain.  Difficulty dressing/grooming: Denies Difficulty climbing stairs: Denies Difficulty getting out of chair: Denies Difficulty using hands for taps, buttons, cutlery, and/or writing: Denies  Review of Systems  Constitutional: Negative for fatigue.  HENT: Negative for mouth sores, mouth dryness and nose dryness.   Eyes: Negative for pain, itching and dryness.  Respiratory: Negative for shortness of breath and difficulty breathing.   Cardiovascular: Negative for chest pain and palpitations.  Gastrointestinal: Negative for blood in stool, constipation and diarrhea.  Endocrine: Negative for increased urination.  Genitourinary: Negative for difficulty urinating.  Musculoskeletal: Negative for arthralgias, joint pain, joint swelling, myalgias, morning stiffness, muscle tenderness and myalgias.  Skin: Negative for color change, rash and redness.  Allergic/Immunologic:  Negative for susceptible to infections.  Neurological: Negative for dizziness, numbness, headaches, memory loss and weakness.  Hematological: Negative for bruising/bleeding tendency.  Psychiatric/Behavioral: Negative for confusion.    PMFS History:  Patient Active Problem List   Diagnosis Date Noted  . Elevated LDL cholesterol level 01/12/2020  . DDD (degenerative disc disease), cervical 07/13/2018  . Chronic kidney disease (CKD) stage G3b 01/21/2017  . History of hypothyroidism 01/20/2017  . History of congestive heart failure 08/26/2016  . Osteopenia of multiple sites 08/25/2016  . DDD cervical spine 08/25/2016  . Primary osteoarthritis of both hands 08/25/2016  . High risk medication use 06/11/2016  . CHF, acute (HCC) 02/01/2013  . 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 Hypothyroidism 01/19/2007  . Dyslipidemia 01/19/2007  . Essential hypertension 01/19/2007  . Rheumatoid arthritis (HCC) 01/19/2007    Past Medical History:  Diagnosis Date  . HYPERLIPIDEMIA 01/19/2007  . HYPERTENSION 01/19/2007  . HYPOTHYROIDISM 01/19/2007  . Rheumatoid arthritis(714.0) 01/19/2007    Family History  Problem Relation Age of Onset  . Diabetes Father   . Heart disease Father   . Cancer Sister        breast   . Heart attack Brother   . Heart attack Brother   . Heart attack Brother   . Heart disease Sister    Past Surgical History:  Procedure Laterality Date  . ABDOMINAL HYSTERECTOMY    . APPENDECTOMY    . CHOLECYSTECTOMY    . NECK SURGERY     Social History   Social History Narrative  . Not on file  Immunization History  Administered Date(s) Administered  . Fluad Quad(high Dose 65+) 05/06/2019  . Influenza,inj,Quad PF,6+ Mos 07/06/2013  . Pneumococcal Conjugate-13 07/06/2013  . Pneumococcal Polysaccharide-23 04/09/2011, 06/21/2012  . Tdap 04/09/2011     Objective: Vital Signs: BP (!) 144/77 (BP Location: Right Arm, Patient Position: Sitting, Cuff Size: Normal)   Pulse 80    Resp 14   Ht 5' 1.5" (1.562 m)   Wt 135 lb 9.6 oz (61.5 kg)   BMI 25.21 kg/m    Physical Exam Vitals and nursing note reviewed.  Constitutional:      Appearance: She is well-developed and well-nourished.  HENT:     Head: Normocephalic and atraumatic.  Eyes:     Extraocular Movements: EOM normal.     Conjunctiva/sclera: Conjunctivae normal.  Cardiovascular:     Pulses: Intact distal pulses.  Abdominal:     Palpations: Abdomen is soft.  Musculoskeletal:     Cervical back: Normal range of motion.  Skin:    General: Skin is warm and dry.     Capillary Refill: Capillary refill takes less than 2 seconds.  Neurological:     Mental Status: She is alert and oriented to person, place, and time.  Psychiatric:        Mood and Affect: Mood and affect normal.        Behavior: Behavior normal.      Musculoskeletal Exam: C-spine, thoracic spine, and lumbar spine good ROM.  Shoulder joints, elbow joints, wrist joints, MCPs, PIPs, and DIPs good ROM with no synovitis.  Complete fist formation bilaterally.  Mild DIP and PIP thickening consistent with osteoarthritis of both hands. Hip joints good ROM with no discomfort.  No tenderness over trochanteric bursa bilaterally. Knee joints good ROM with no warmth or effusion.  Ankle joints good ROM with no tenderness or swelling.  Pedal edema noted bilaterally.   CDAI Exam: CDAI Score: 0.2  Patient Global: 1 mm; Provider Global: 1 mm Swollen: 0 ; Tender: 0  Joint Exam 08/09/2020   No joint exam has been documented for this visit   There is currently no information documented on the homunculus. Go to the Rheumatology activity and complete the homunculus joint exam.  Investigation: No additional findings.  Imaging: No results found.  Recent Labs: Lab Results  Component Value Date   WBC 5.1 03/08/2020   HGB 12.5 03/08/2020   PLT 250 03/08/2020   NA 141 03/08/2020   K 4.8 03/08/2020   CL 104 03/08/2020   CO2 27 03/08/2020   GLUCOSE 97  03/08/2020   BUN 23 03/08/2020   CREATININE 1.08 (H) 03/08/2020   BILITOT 0.5 03/08/2020   ALKPHOS 59 07/12/2019   AST 26 03/08/2020   ALT 17 03/08/2020   PROT 7.3 03/08/2020   ALBUMIN 4.1 07/12/2019   CALCIUM 9.9 03/08/2020   GFRAA 55 (L) 03/08/2020    Speciality Comments: PLQ Eye Exam: 10/04/2019 @ [facility]  Prior therapy: Methotrexate (lung injury)   Procedures:  No procedures performed Allergies: Tramadol and Codeine phosphate   Assessment / Plan:     Visit Diagnoses: Rheumatoid arthritis involving multiple sites with positive rheumatoid factor ([place]): She has no joint tenderness or synovitis on examination today.  She has not had any recent rheumatoid arthritis flares.  She is clinically doing well on Arava 20 mg 1 tablet by mouth daily.  She has been tolerating [place] and has not missed any doses recently.  She is not experiencing any joint pain or inflammation at  this time.  She has not had any nocturnal pain or increased morning stiffness recently.  She has no difficulty with ADLs.  She will continue taking Arava 20 mg 1 tablet by mouth daily.  A refill of [place] was sent to the pharmacy.  She was advised to notify us if she develops increased joint pain or joint swelling.  She will follow up in the office in 5 months.   High risk medication use - Arava 20 mg 1 tablet by mouth daily.CBC and CMP updated on 03/08/20.  She is overdue to update lab work.  Orders for CBC and CMP were released today.  Her next lab work will be due in April and every 3 months to monitor for drug toxicity.   - Plan: CBC with Differential/Platelet, COMPLETE METABOLIC PANEL WITH GFR She has not had any recent infections.  Discussed the importance of holding [place] if she develops signs or symptoms of an infection and to resume once the infection has completely cleared.  She is planning on receiving the first covid-19 soon.   Primary osteoarthritis of both hands: She has mild PIP and DIP thickening  consistent with osteoarthritis of both hands.  No joint tenderness or inflammation noted.  She has complete fist formation bilaterally.  Joint protection and muscle strengthening were discussed.  DDD (degenerative disc disease), cervical: She has good ROM with no discomfort at this time.   Trochanteric bursitis, right hip: She experienced discomfort on the lateral aspect of her right hip last week which lasted several days.  Her discomfort has since resolved.  She has no tenderness palpation on examination today.  She is a side sleeper which likely exacerbated her discomfort.  She is also been sitting for prolonged periods of time with the cooler weather temperatures outside.  We discussed the importance of changing positions frequently.  We also discussed performing stretching exercises on a daily basis.  She was given a handout of these exercises to perform.  Osteopenia of multiple sites - Future order for DEXA is in place.   Other medical conditions are listed as follows:  Dyslipidemia  Chronic kidney disease (CKD) stage G3b - Plan: benazepril (LOTENSIN) 10 MG tablet  History of hypertension  History of hypothyroidism  History of congestive heart failure  Essential hypertension -Patient requested a refill of lotensin today.  plan: benazepril (LOTENSIN) 10 MG tablet  Orders: Orders Placed This Encounter  Procedures  . CBC with Differential/Platelet  . COMPLETE METABOLIC PANEL WITH GFR   Meds ordered this encounter  Medications  . leflunomide (ARAVA) 20 MG tablet    Sig: Take 1 tablet (20 mg total) by mouth daily.    Dispense:  90 tablet    Refill:  0  . benazepril (LOTENSIN) 10 MG tablet    Sig: Take 1 tablet (10 mg total) by mouth daily.    Dispense:  90 tablet    Refill:  0     Follow-Up Instructions: Return in about 5 months (around 01/07/2021) for Rheumatoid arthritis, Osteoarthritis, DDD.   Ofilia Neas, PA-C  Note - This record has been created using Dragon  software.  Chart creation errors have been sought, but may not always  have been located. Such creation errors do not reflect on  the standard of medical care.

## 2020-08-05 ENCOUNTER — Other Ambulatory Visit: Payer: Self-pay | Admitting: Rheumatology

## 2020-08-09 ENCOUNTER — Encounter: Payer: Self-pay | Admitting: Physician Assistant

## 2020-08-09 ENCOUNTER — Ambulatory Visit: Payer: Medicare HMO | Admitting: Physician Assistant

## 2020-08-09 ENCOUNTER — Other Ambulatory Visit: Payer: Self-pay

## 2020-08-09 VITALS — BP 144/77 | HR 80 | Resp 14 | Ht 61.5 in | Wt 135.6 lb

## 2020-08-09 DIAGNOSIS — Z8679 Personal history of other diseases of the circulatory system: Secondary | ICD-10-CM | POA: Diagnosis not present

## 2020-08-09 DIAGNOSIS — M5030 Other cervical disc degeneration, unspecified cervical region: Secondary | ICD-10-CM

## 2020-08-09 DIAGNOSIS — M0579 Rheumatoid arthritis with rheumatoid factor of multiple sites without organ or systems involvement: Secondary | ICD-10-CM

## 2020-08-09 DIAGNOSIS — M19041 Primary osteoarthritis, right hand: Secondary | ICD-10-CM

## 2020-08-09 DIAGNOSIS — E785 Hyperlipidemia, unspecified: Secondary | ICD-10-CM | POA: Diagnosis not present

## 2020-08-09 DIAGNOSIS — M8589 Other specified disorders of bone density and structure, multiple sites: Secondary | ICD-10-CM | POA: Diagnosis not present

## 2020-08-09 DIAGNOSIS — I10 Essential (primary) hypertension: Secondary | ICD-10-CM

## 2020-08-09 DIAGNOSIS — N1832 Chronic kidney disease, stage 3b: Secondary | ICD-10-CM | POA: Diagnosis not present

## 2020-08-09 DIAGNOSIS — M19042 Primary osteoarthritis, left hand: Secondary | ICD-10-CM

## 2020-08-09 DIAGNOSIS — Z8639 Personal history of other endocrine, nutritional and metabolic disease: Secondary | ICD-10-CM | POA: Diagnosis not present

## 2020-08-09 DIAGNOSIS — M7061 Trochanteric bursitis, right hip: Secondary | ICD-10-CM | POA: Diagnosis not present

## 2020-08-09 DIAGNOSIS — Z79899 Other long term (current) drug therapy: Secondary | ICD-10-CM | POA: Diagnosis not present

## 2020-08-09 MED ORDER — LEFLUNOMIDE 20 MG PO TABS: 20.0000 mg | ORAL_TABLET | Freq: Every day | ORAL | 0 refills | Status: DC

## 2020-08-09 MED ORDER — BENAZEPRIL HCL 10 MG PO TABS
10.0000 mg | ORAL_TABLET | Freq: Every day | ORAL | 0 refills | Status: DC
Start: 2020-08-09 — End: 2020-11-05

## 2020-08-09 NOTE — Patient Instructions (Addendum)
Standing Labs We placed an order today for your standing lab work.   Please have your standing labs drawn in April and every 3 months   If possible, please have your labs drawn 2 weeks prior to your appointment so that the provider can discuss your results at your appointment.  We have open lab daily Monday through Thursday from 8:30-12:30 PM and 1:30-4:30 PM and Friday from 8:30-12:30 PM and 1:30-4:00 PM at the office of Dr. Pollyann Savoy, [facility].   Please be advised, patients with office appointments requiring lab work will take precedents over walk-in lab work.  If possible, please come for your lab work on Monday and Friday afternoons, as you may experience shorter wait times. The office is located at [address], [address], [place], [place] [address] No appointment is necessary.   Labs are drawn by [facility]. Please bring your co-pay at the time of your lab draw.  You may receive a bill from [facility] for your lab work.  If you wish to have your labs drawn at another location, please call the office 24 hours in advance to send orders.  If you have any questions regarding directions or hours of operation,  please call [phone].   As a reminder, please drink plenty of water prior to coming for your lab work. Thanks!    Hip Bursitis Rehab Ask your health care provider which exercises are safe for you. Do exercises exactly as told by your health care provider and adjust them as directed. It is normal to feel mild stretching, pulling, tightness, or discomfort as you do these exercises. Stop right away if you feel sudden pain or your pain gets worse. Do not begin these exercises until told by your health care provider. Stretching exercise This exercise warms up your muscles and joints and improves the movement and flexibility of your hip. This exercise also helps to relieve pain and stiffness. Iliotibial band stretch An iliotibial band is a strong band of muscle tissue  that runs from the outer side of your hip to the outer side of your thigh and knee. 1. Lie on your side with your left / right leg in the top position. 2. Bend your left / right knee and grab your ankle. Stretch out your bottom arm to help you balance. 3. Slowly bring your knee back so your thigh is behind your body. 4. Slowly lower your knee toward the floor until you feel a gentle stretch on the outside of your left / right thigh. If you do not feel a stretch and your knee will not fall farther, place the heel of your other foot on top of your knee and pull your knee down toward the floor with your foot. 5. Hold this position for __________ seconds. 6. Slowly return to the starting position. Repeat __________ times. Complete this exercise __________ times a day. Strengthening exercises These exercises build strength and endurance in your hip and pelvis. Endurance is the ability to use your muscles for a long time, even after they get tired. Bridge This exercise strengthens the muscles that move your thigh backward (hip extensors). 1. Lie on your back on a firm surface with your knees bent and your feet flat on the floor. 2. Tighten your buttocks muscles and lift your buttocks off the floor until your trunk is level with your thighs. ? Do not arch your back. ? You should feel the muscles working in your buttocks and the back of your thighs. If  you do not feel these muscles, slide your feet 1-2 inches (2.5-5 cm) farther away from your buttocks. ? If this exercise is too easy, try doing it with your arms crossed over your chest. 3. Hold this position for __________ seconds. 4. Slowly lower your hips to the starting position. 5. Let your muscles relax completely after each repetition. Repeat __________ times. Complete this exercise __________ times a day. Squats This exercise strengthens the muscles in front of your thigh and knee (quadriceps). 1. Stand in front of a table, with your feet and  knees pointing straight ahead. You may rest your hands on the table for balance but not for support. 2. Slowly bend your knees and lower your hips like you are going to sit in a chair. ? Keep your weight over your heels, not over your toes. ? Keep your lower legs upright so they are parallel with the table legs. ? Do not let your hips go lower than your knees. ? Do not bend lower than told by your health care provider. ? If your hip pain increases, do not bend as low. 3. Hold the squat position for __________ seconds. 4. Slowly push with your legs to return to standing. Do not use your hands to pull yourself to standing. Repeat __________ times. Complete this exercise __________ times a day. Hip hike 1. Stand sideways on a bottom step. Stand on your left / right leg with your other foot unsupported next to the step. You can hold on to the railing or wall for balance if needed. 2. Keep your knees straight and your torso square. Then lift your left / right hip up toward the ceiling. 3. Hold this position for __________ seconds. 4. Slowly let your left / right hip lower toward the floor, past the starting position. Your foot should get closer to the floor. Do not lean or bend your knees. Repeat __________ times. Complete this exercise __________ times a day. Single leg stand 1. Without shoes, stand near a railing or in a doorway. You may hold on to the railing or door frame as needed for balance. 2. Squeeze your left / right buttock muscles, then lift up your other foot. ? Do not let your left / right hip push out to the side. ? It is helpful to stand in front of a mirror for this exercise so you can watch your hip. 3. Hold this position for __________ seconds. Repeat __________ times. Complete this exercise __________ times a day. This information is not intended to replace advice given to you by your health care provider. Make sure you discuss any questions you have with your health care  provider. Document Revised: 11/15/2018 Document Reviewed: 11/15/2018 Elsevier Patient Education  [place].

## 2020-08-10 DIAGNOSIS — H04129 Dry eye syndrome of unspecified lacrimal gland: Secondary | ICD-10-CM | POA: Diagnosis not present

## 2020-08-10 DIAGNOSIS — Z809 Family history of malignant neoplasm, unspecified: Secondary | ICD-10-CM | POA: Diagnosis not present

## 2020-08-10 DIAGNOSIS — Z7982 Long term (current) use of aspirin: Secondary | ICD-10-CM | POA: Diagnosis not present

## 2020-08-10 DIAGNOSIS — Z008 Encounter for other general examination: Secondary | ICD-10-CM | POA: Diagnosis not present

## 2020-08-10 DIAGNOSIS — I10 Essential (primary) hypertension: Secondary | ICD-10-CM | POA: Diagnosis not present

## 2020-08-10 DIAGNOSIS — M069 Rheumatoid arthritis, unspecified: Secondary | ICD-10-CM | POA: Diagnosis not present

## 2020-08-10 DIAGNOSIS — D84821 Immunodeficiency due to drugs: Secondary | ICD-10-CM | POA: Diagnosis not present

## 2020-08-10 DIAGNOSIS — Z8249 Family history of ischemic heart disease and other diseases of the circulatory system: Secondary | ICD-10-CM | POA: Diagnosis not present

## 2020-08-10 DIAGNOSIS — I739 Peripheral vascular disease, unspecified: Secondary | ICD-10-CM | POA: Diagnosis not present

## 2020-08-10 DIAGNOSIS — E039 Hypothyroidism, unspecified: Secondary | ICD-10-CM | POA: Diagnosis not present

## 2020-08-10 DIAGNOSIS — Z833 Family history of diabetes mellitus: Secondary | ICD-10-CM | POA: Diagnosis not present

## 2020-08-10 LAB — COMPLETE METABOLIC PANEL WITH GFR
AG Ratio: 1.3 (calc) (ref 1.0–2.5)
ALT: 11 U/L (ref 6–29)
AST: 23 U/L (ref 10–35)
Albumin: 4.1 g/dL (ref 3.6–5.1)
Alkaline phosphatase (APISO): 69 U/L (ref 37–153)
BUN/Creatinine Ratio: 19 (calc) (ref 6–22)
BUN: 22 mg/dL (ref 7–25)
CO2: 27 mmol/L (ref 20–32)
Calcium: 10.2 mg/dL (ref 8.6–10.4)
Chloride: 106 mmol/L (ref 98–110)
Creat: 1.17 mg/dL — ABNORMAL HIGH (ref 0.60–0.88)
GFR, Est African American: 50 mL/min/{1.73_m2} — ABNORMAL LOW (ref >=60)
GFR, Est Non African American: 43 mL/min/{1.73_m2} — ABNORMAL LOW (ref >=60)
Globulin: 3.1 g/dL (calc) (ref 1.9–3.7)
Glucose, Bld: 95 mg/dL (ref 65–99)
Potassium: 5.3 mmol/L (ref 3.5–5.3)
Sodium: 141 mmol/L (ref 135–146)
Total Bilirubin: 0.4 mg/dL (ref 0.2–1.2)
Total Protein: 7.2 g/dL (ref 6.1–8.1)

## 2020-08-10 LAB — CBC WITH DIFFERENTIAL/PLATELET
Absolute Monocytes: 666 cells/uL (ref 200–950)
Basophils Absolute: 48 cells/uL (ref 0–200)
Basophils Relative: 0.8 %
Eosinophils Absolute: 78 cells/uL (ref 15–500)
Eosinophils Relative: 1.3 %
HCT: 38.7 % (ref 35.0–45.0)
Hemoglobin: 12.1 g/dL (ref 11.7–15.5)
Lymphs Abs: 4440 cells/uL — ABNORMAL HIGH (ref 850–3900)
MCH: 25.4 pg — ABNORMAL LOW (ref 27.0–33.0)
MCHC: 31.3 g/dL — ABNORMAL LOW (ref 32.0–36.0)
MCV: 81.1 fL (ref 80.0–100.0)
MPV: 10.8 fL (ref 7.5–12.5)
Monocytes Relative: 11.1 %
Neutro Abs: 768 cells/uL — ABNORMAL LOW (ref 1500–7800)
Neutrophils Relative %: 12.8 %
Platelets: 215 10*3/uL (ref 140–400)
RBC: 4.77 10*6/uL (ref 3.80–5.10)
RDW: 13.3 % (ref 11.0–15.0)
Total Lymphocyte: 74 %
WBC: 6 10*3/uL (ref 3.8–10.8)

## 2020-08-10 NOTE — Progress Notes (Signed)
Absolute neutrophils are low-768.  Absolute lymphocytes are elevated.   She has not had any recent infections or vaccinations.   Please advise the patient to repeat CBC in 1 month.   Creatinine is elevated-1.17 and GFR is low-43.  Please advise the patient to avoid taking NSAIDs.  Please forward lab work to PCP.

## 2020-08-15 ENCOUNTER — Encounter: Payer: Self-pay | Admitting: *Deleted

## 2020-08-15 ENCOUNTER — Telehealth: Payer: Self-pay | Admitting: *Deleted

## 2020-08-15 DIAGNOSIS — Z79899 Other long term (current) drug therapy: Secondary | ICD-10-CM

## 2020-08-15 NOTE — Telephone Encounter (Signed)
-----   Message from Ofilia Neas, PA-C sent at 08/10/2020  8:10 AM EST ----- Absolute neutrophils are low-768.  Absolute lymphocytes are elevated.   She has not had any recent infections or vaccinations.   Please advise the patient to repeat CBC in 1 month.   Creatinine is elevated-1.17 and GFR is low-43.  Please advise the patient to avoid taking NSAIDs.  Please forward lab work to PCP.

## 2020-09-04 ENCOUNTER — Telehealth: Payer: Self-pay

## 2020-09-04 NOTE — Telephone Encounter (Signed)
Please advise use of Tylenol over-the-counter.

## 2020-09-04 NOTE — Telephone Encounter (Signed)
Patient advised she may use use OTC Tylenol. Patient expressed understanding.

## 2020-09-04 NOTE — Telephone Encounter (Signed)
Patient called stating she is experiencing pain in her right hip.  Patient requested a return call to let her know if there is anything she can take to help with the pain.  Patient states she doesn't drive and her son is working today.

## 2020-09-04 NOTE — Telephone Encounter (Signed)
Swelling in joints? No swelling Any pain? Right hip When did it start?   Does anything relieve the discomfort?  Does anything make it worse?  Have you had a recent injury?  Are you taking your medication as prescribed?

## 2020-09-04 NOTE — Telephone Encounter (Signed)
Swelling in joints? No swelling Any pain? Right hip When did it start?  09/01/2020 Does anything relieve the discomfort? Sitting and elevating her feet.  Does anything make it worse? Walking, activity.  Have you had a recent injury? No  Are you taking your medication as prescribed? Patient is on [place]. Patient states she is taking it daily.   Patient's most recent labs on 08/09/2020: Creatinine is elevated-1.17 and GFR is low-43.   Patient would like to know what she can take to help with with the pain. Please advise.

## 2020-09-06 ENCOUNTER — Telehealth: Payer: Self-pay | Admitting: Family Medicine

## 2020-09-06 NOTE — Telephone Encounter (Signed)
Left message for patient to schedule Annual Wellness Visit.  Please schedule with Nurse Health Advisor Martha Stanley, RN at [facility]  °

## 2020-10-15 ENCOUNTER — Encounter: Payer: Self-pay | Admitting: Family Medicine

## 2020-10-15 ENCOUNTER — Ambulatory Visit (INDEPENDENT_AMBULATORY_CARE_PROVIDER_SITE_OTHER): Payer: Medicare HMO | Admitting: Family Medicine

## 2020-10-15 ENCOUNTER — Other Ambulatory Visit: Payer: Self-pay

## 2020-10-15 VITALS — BP 124/72 | HR 111 | Temp 97.3°F | Ht 61.0 in | Wt 127.8 lb

## 2020-10-15 DIAGNOSIS — I471 Supraventricular tachycardia, unspecified: Secondary | ICD-10-CM | POA: Insufficient documentation

## 2020-10-15 DIAGNOSIS — D72820 Lymphocytosis (symptomatic): Secondary | ICD-10-CM

## 2020-10-15 DIAGNOSIS — E039 Hypothyroidism, unspecified: Secondary | ICD-10-CM | POA: Diagnosis not present

## 2020-10-15 DIAGNOSIS — D709 Neutropenia, unspecified: Secondary | ICD-10-CM | POA: Diagnosis not present

## 2020-10-15 DIAGNOSIS — I10 Essential (primary) hypertension: Secondary | ICD-10-CM | POA: Diagnosis not present

## 2020-10-15 DIAGNOSIS — N1832 Chronic kidney disease, stage 3b: Secondary | ICD-10-CM | POA: Diagnosis not present

## 2020-10-15 LAB — TSH: TSH: 1.49 u[IU]/mL (ref 0.35–4.50)

## 2020-10-15 LAB — CBC WITH DIFFERENTIAL/PLATELET
Basophils Absolute: 0.1 10*3/uL (ref 0.0–0.1)
Basophils Relative: 1.1 % (ref 0.0–3.0)
Eosinophils Absolute: 0.1 10*3/uL (ref 0.0–0.7)
Eosinophils Relative: 1.7 % (ref 0.0–5.0)
HCT: 37.5 % (ref 36.0–46.0)
Hemoglobin: 11.8 g/dL — ABNORMAL LOW (ref 12.0–15.0)
Lymphocytes Relative: 65.8 % — ABNORMAL HIGH (ref 12.0–46.0)
Lymphs Abs: 3.4 10*3/uL (ref 0.7–4.0)
MCHC: 31.6 g/dL (ref 30.0–36.0)
MCV: 79.1 fl (ref 78.0–100.0)
Monocytes Absolute: 0.7 10*3/uL (ref 0.1–1.0)
Monocytes Relative: 13.9 % — ABNORMAL HIGH (ref 3.0–12.0)
Neutro Abs: 0.9 10*3/uL — ABNORMAL LOW (ref 1.4–7.7)
Neutrophils Relative %: 17.5 % — ABNORMAL LOW (ref 43.0–77.0)
Platelets: 248 10*3/uL (ref 150.0–400.0)
RBC: 4.74 Mil/uL (ref 3.87–5.11)
RDW: 14.9 % (ref 11.5–15.5)
WBC: 5.2 10*3/uL (ref 4.0–10.5)

## 2020-10-15 LAB — BASIC METABOLIC PANEL
BUN: 14 mg/dL (ref 6–23)
CO2: 29 mEq/L (ref 19–32)
Calcium: 9.8 mg/dL (ref 8.4–10.5)
Chloride: 103 mEq/L (ref 96–112)
Creatinine, Ser: 1.02 mg/dL (ref 0.40–1.20)
GFR: 50.77 mL/min — ABNORMAL LOW (ref >60.00)
Glucose, Bld: 99 mg/dL (ref 70–99)
Potassium: 5.1 mEq/L (ref 3.5–5.1)
Sodium: 139 mEq/L (ref 135–145)

## 2020-10-15 NOTE — Progress Notes (Addendum)
Established Patient Office Visit  Subjective:  Patient ID: [name] [name], female    DOB: 1934-08-25  Age: 86 y.o. MRN: [id number]  CC:  Chief Complaint  Patient presents with   Follow-up    6 month follow up on BP, no concerns. Patient fasting for labs.     HPI [facility] presents for fu of htn, hypothyroidism, abnormal cbc, ckd and tachycardia. Heart rate commonly elevated. 2 cups of caffeine in am. Levothyx on fasting stomach 30 minutes prior to eating. TSH well adjusted. CBC abormal in January at [facility].   Past Medical History:  Diagnosis Date   HYPERLIPIDEMIA 01/19/2007   HYPERTENSION 01/19/2007   HYPOTHYROIDISM 01/19/2007   Rheumatoid arthritis(714.0) 01/19/2007    Past Surgical History:  Procedure Laterality Date   ABDOMINAL HYSTERECTOMY     APPENDECTOMY     CHOLECYSTECTOMY     NECK SURGERY      Family History  Problem Relation Age of Onset   Diabetes Father    Heart disease Father    Cancer Sister        breast    Heart attack Brother    Heart attack Brother    Heart attack Brother    Heart disease Sister     Social History   Socioeconomic History   Marital status: Married    Spouse name: Not on file   Number of children: Not on file   Years of education: Not on file   Highest education level: Not on file  Occupational History   Not on file  Tobacco Use   Smoking status: Never Smoker   Smokeless tobacco: Never Used  Vaping Use   Vaping Use: Never used  Substance and Sexual Activity   Alcohol use: No   Drug use: No   Sexual activity: Not on file  Other Topics Concern   Not on file  Social History Narrative   Not on file   Social Determinants of Health   Financial Resource Strain: Not on file  Food Insecurity: Not on file  Transportation Needs: Not on file  Physical Activity: Not on file  Stress: Not on file  Social Connections: Not on file  Intimate Partner Violence: Not on file    Outpatient  Medications Prior to Visit  Medication Sig Dispense Refill   benazepril (LOTENSIN) 10 MG tablet Take 1 tablet (10 mg total) by mouth daily. 90 tablet 0   leflunomide (ARAVA) 20 MG tablet Take 1 tablet (20 mg total) by mouth daily. 90 tablet 0   levothyroxine (SYNTHROID) 75 MCG tablet TAKE 1 TABLET BY MOUTH EVERY DAY 90 tablet 3   Multiple Vitamins-Minerals (MULTIVITAMIN WITH MINERALS) tablet Take 1 tablet by mouth daily.     Polyethyl Glycol-Propyl Glycol (SYSTANE OP) Apply to eye daily.     No facility-administered medications prior to visit.    Allergies  Allergen Reactions   Tramadol Nausea Only   Codeine Phosphate Nausea Only    REACTION: unspecified    ROS Review of Systems  Constitutional: Negative for chills, diaphoresis, fatigue, fever and unexpected weight change.  HENT: Negative.   Eyes: Negative for photophobia and visual disturbance.  Respiratory: Negative for chest tightness, shortness of breath and wheezing.   Cardiovascular: Positive for palpitations. Negative for chest pain and leg swelling.  Gastrointestinal: Negative for anal bleeding and blood in stool.  Endocrine: Negative for cold intolerance and heat intolerance.  Genitourinary: Negative.  Negative for hematuria.  Musculoskeletal: Negative for gait problem and  joint swelling.  Skin: Negative for color change.  Neurological: Negative for weakness and light-headedness.  Hematological: Does not bruise/bleed easily.  Psychiatric/Behavioral: Negative.       Objective:    Physical Exam Vitals and nursing note reviewed.  Constitutional:      General: She is not in acute distress.    Appearance: Normal appearance. She is not ill-appearing, toxic-appearing or diaphoretic.  HENT:     Head: Normocephalic and atraumatic.     Right Ear: External ear normal.     Left Ear: External ear normal.     Mouth/Throat:     Pharynx: Oropharynx is clear. No oropharyngeal exudate or posterior oropharyngeal erythema.   Eyes:     General:        Right eye: No discharge.        Left eye: No discharge.     Extraocular Movements: Extraocular movements intact.     Conjunctiva/sclera: Conjunctivae normal.     Pupils: Pupils are equal, round, and reactive to light.  Neck:     Vascular: No carotid bruit.  Cardiovascular:     Rate and Rhythm: Tachycardia present.  Pulmonary:     Effort: Pulmonary effort is normal.     Breath sounds: Normal breath sounds.  Abdominal:     General: Bowel sounds are normal.  Musculoskeletal:     Cervical back: No rigidity or tenderness.     Right lower leg: No edema.     Left lower leg: No edema.  Lymphadenopathy:     Cervical: No cervical adenopathy.  Skin:    General: Skin is warm and dry.  Neurological:     Mental Status: She is alert and oriented to person, place, and time.  Psychiatric:        Mood and Affect: Mood normal.        Behavior: Behavior normal.     BP 124/72    Pulse (!) 111    Temp (!) 97.3 F (36.3 C) (Temporal)    Ht 5\' 1"  (1.549 m)    Wt 127 lb 12.8 oz (58 kg)    SpO2 97%    BMI 24.15 kg/m  Wt Readings from Last 3 Encounters:  10/15/20 127 lb 12.8 oz (58 kg)  08/09/20 135 lb 9.6 oz (61.5 kg)  04/16/20 133 lb 12.8 oz (60.7 kg)     Health Maintenance Due  Topic Date Due   MAMMOGRAM  07/23/2016    There are no preventive care reminders to display for this patient.  Lab Results  Component Value Date   TSH 1.49 10/15/2020   Lab Results  Component Value Date   WBC 5.2 10/15/2020   HGB 11.8 (L) 10/15/2020   HCT 37.5 10/15/2020   MCV 79.1 10/15/2020   PLT 248.0 10/15/2020   Lab Results  Component Value Date   NA 139 10/15/2020   K 5.1 10/15/2020   CO2 29 10/15/2020   GLUCOSE 99 10/15/2020   BUN 14 10/15/2020   CREATININE 1.02 10/15/2020   BILITOT 0.4 08/09/2020   ALKPHOS 59 07/12/2019   AST 23 08/09/2020   ALT 11 08/09/2020   PROT 7.2 08/09/2020   ALBUMIN 4.1 07/12/2019   CALCIUM 9.8 10/15/2020   GFR 50.77 (L)  10/15/2020   Lab Results  Component Value Date   CHOL 281 (H) 02/03/2019   Lab Results  Component Value Date   HDL 93 02/03/2019   Lab Results  Component Value Date   LDLCALC 156 (H) 02/03/2019  Lab Results  Component Value Date   TRIG 161 (H) 02/03/2019   Lab Results  Component Value Date   CHOLHDL 3.0 02/03/2019   Lab Results  Component Value Date   HGBA1C 5.9 (H) 01/31/2013      Assessment & Plan:   Problem List Items Addressed This Visit      Cardiovascular and Mediastinum   Essential hypertension - Primary   Relevant Orders   Basic metabolic panel (Completed)   CBC w/Diff (Completed)   Paroxysmal supraventricular tachycardia ([place])   Relevant Orders   Ambulatory referral to Cardiology     Endocrine   Hypothyroidism   Relevant Orders   TSH (Completed)     Genitourinary   Chronic kidney disease (CKD) stage G3b   Relevant Orders   Basic metabolic panel (Completed)     Other   Lymphocytosis   Relevant Orders   Ambulatory referral to Hematology   Neutropenia [facility])   Relevant Orders   Ambulatory referral to Hematology      No orders of the defined types were placed in this encounter.   Follow-up: Return in about 6 months (around 04/17/2021).  Continue current meds for htn and ckd. Adjust levothyroxine prn.  tacchycardia with caffeine?   Libby Maw, MD

## 2020-10-16 DIAGNOSIS — D709 Neutropenia, unspecified: Secondary | ICD-10-CM | POA: Insufficient documentation

## 2020-10-16 DIAGNOSIS — D72820 Lymphocytosis (symptomatic): Secondary | ICD-10-CM | POA: Insufficient documentation

## 2020-10-16 NOTE — Addendum Note (Signed)
Addended by: Jon Billings on: 10/16/2020 08:14 AM   Modules accepted: Orders

## 2020-10-17 ENCOUNTER — Telehealth: Payer: Self-pay | Admitting: *Deleted

## 2020-10-17 NOTE — Telephone Encounter (Signed)
Per referral 10/16/20 Dr. Ethelene Hal - called patient's son and gave upcoming appointments and mailed calendar with welcome packet

## 2020-10-31 ENCOUNTER — Ambulatory Visit: Payer: Medicare HMO | Admitting: Cardiology

## 2020-11-01 NOTE — Progress Notes (Signed)
Cardiology Office Note:    Date:  11/02/2020   ID:  [name], DOB Aug 12, 1934, MRN [id number]  PCP:  Libby Maw, MD  Cardiologist:  Shirlee More, MD   Referring MD: Libby Maw,*  ASSESSMENT:    1. Palpitations   2. Heart murmur   3. Essential hypertension   4. Elevated LDL cholesterol level    PLAN:    In order of problems listed above:  1. I am uncertain whether she has arrhythmia, we will use a 7-day ZIO monitor assessment she is having episodes of tachycardia. 2. Echocardiogram and she has a murmur suggestive of aortic stenosis moderate. 3. Continue ACE inhibitor BP at target 4. Resume a statin will use a low-dose of a low intensity pravastatin  Next appointment 6 weeks   Medication Adjustments/Labs and Tests Ordered: Current medicines are reviewed at length with the patient today.  Concerns regarding medicines are outlined above.  Orders Placed This Encounter  Procedures  . LONG TERM MONITOR (3-14 DAYS)  . EKG 12-Lead  . ECHOCARDIOGRAM COMPLETE   Meds ordered this encounter  Medications  . pravastatin (PRAVACHOL) 20 MG tablet    Sig: Take 1 tablet (20 mg total) by mouth every evening.    Dispense:  90 tablet    Refill:  3     No chief complaint on file.   History of Present Illness:    [name] [name] is a 86 y.o. female who is being seen today for the evaluation of SVT at the request of Libby Maw,*.  She has a history of hypertension hyperlipidemia hypothyroidism and CKD.  She relates a lifelong history of rapid heart rhythm as a child she would have to stop and rest. This is persisted her entire life although the symptoms are not as severe at this time.  She will notice her heart racing she stops the activity slows down but is never been captured and no specific diagnosis.  She has not had syncope the episodes happen daily has not increased in frequency or severity.  She lives in an independent living community  takes care of her son has no chest pain shortness of breath or syncope.  She has no known history of congenital or rheumatic heart disease. Past Medical History:  Diagnosis Date  . HYPERLIPIDEMIA 01/19/2007  . HYPERTENSION 01/19/2007  . HYPOTHYROIDISM 01/19/2007  . Rheumatoid arthritis(714.0) 01/19/2007    Past Surgical History:  Procedure Laterality Date  . ABDOMINAL HYSTERECTOMY    . APPENDECTOMY    . CHOLECYSTECTOMY    . NECK SURGERY      Current Medications: Current Meds  Medication Sig  . benazepril (LOTENSIN) 10 MG tablet Take 1 tablet (10 mg total) by mouth daily.  Sherrine Maples ([facility] PO) Take 500 mg by mouth daily.  Marland Kitchen leflunomide (ARAVA) 20 MG tablet Take 1 tablet (20 mg total) by mouth daily.  Marland Kitchen levothyroxine (SYNTHROID) 75 MCG tablet TAKE 1 TABLET BY MOUTH EVERY DAY  . Multiple Minerals-Vitamins (CALCIUM CITRATE PLUS/MAGNESIUM) TABS Take 1 tablet by mouth daily.  . Multiple Vitamins-Minerals (MULTIVITAMIN WITH MINERALS) tablet Take 1 tablet by mouth daily.  Vladimir Faster Glycol-Propyl Glycol (SYSTANE OP) Apply to eye daily.  . pravastatin (PRAVACHOL) 20 MG tablet Take 1 tablet (20 mg total) by mouth every evening.     Allergies:   Tramadol and Codeine phosphate   Social History   Socioeconomic History  . Marital status: Married    Spouse name: Not on  file  . Number of children: Not on file  . Years of education: Not on file  . Highest education level: Not on file  Occupational History  . Not on file  Tobacco Use  . Smoking status: Never Smoker  . Smokeless tobacco: Never Used  Vaping Use  . Vaping Use: Never used  Substance and Sexual Activity  . Alcohol use: No  . Drug use: No  . Sexual activity: Not on file  Other Topics Concern  . Not on file  Social History Narrative  . Not on file   Social Determinants of Health   Financial Resource Strain: Not on file  Food Insecurity: Not on file  Transportation Needs: Not on file   Physical Activity: Not on file  Stress: Not on file  Social Connections: Not on file     Family History: The patient's family history includes Cancer in her sister; Diabetes in her father; Heart attack in her brother, brother, and brother; Heart disease in her father and sister.  ROS:   ROS Please see the history of present illness.     All other systems reviewed and are negative.  EKGs/Labs/Other Studies Reviewed:    The following studies were reviewed today:   EKG:  EKG is  ordered today.  The ekg ordered today is personally reviewed and demonstrates sinus rhythm consider right atrial enlargement otherwise normal  Recent Labs: 08/09/2020: ALT 11 10/15/2020: BUN 14; Creatinine, Ser 1.02; Hemoglobin 11.8; Platelets 248.0; Potassium 5.1; Sodium 139; TSH 1.49  Recent Lipid Panel    Component Value Date/Time   CHOL 281 (H) 02/03/2019 1129   TRIG 161 (H) 02/03/2019 1129   TRIG 101 07/01/2006 1014   HDL 93 02/03/2019 1129   CHOLHDL 3.0 02/03/2019 1129   CHOLHDL 4 11/13/2016 0840   VLDL 37.0 11/13/2016 0840   LDLCALC 156 (H) 02/03/2019 1129   LDLDIRECT 161 (H) 02/03/2019 1129   LDLDIRECT 176.0 04/09/2011 0943    Physical Exam:    VS:  BP 132/72 (BP Location: Right Arm, Patient Position: Sitting)   Pulse 87   Ht 5\' 1"  (1.549 m)   Wt 128 lb 1.9 oz (58.1 kg)   SpO2 98%   BMI 24.21 kg/m     Wt Readings from Last 3 Encounters:  11/02/20 128 lb 1.9 oz (58.1 kg)  10/15/20 127 lb 12.8 oz (58 kg)  08/09/20 135 lb 9.6 oz (61.5 kg)     GEN: Thin well nourished, well developed in no acute distress HEENT: Normal NECK: No JVD; No carotid bruits LYMPHATICS: No lymphadenopathy CARDIAC: Grade 3/6 midsystolic murmur aortic area into the base of the carotids S2 still splits no aortic regurgitation RRR, no murmurs, rubs, gallops RESPIRATORY:  Clear to auscultation without rales, wheezing or rhonchi  ABDOMEN: Soft, non-tender, non-distended MUSCULOSKELETAL:  No edema; No deformity   SKIN: Warm and dry NEUROLOGIC:  Alert and oriented x 3 PSYCHIATRIC:  Normal affect     Signed, Shirlee More, MD  11/02/2020 11:17 AM    [place]

## 2020-11-02 ENCOUNTER — Other Ambulatory Visit: Payer: Self-pay

## 2020-11-02 ENCOUNTER — Ambulatory Visit (INDEPENDENT_AMBULATORY_CARE_PROVIDER_SITE_OTHER): Payer: Medicare HMO

## 2020-11-02 ENCOUNTER — Encounter: Payer: Self-pay | Admitting: Cardiology

## 2020-11-02 ENCOUNTER — Ambulatory Visit: Payer: Medicare HMO | Admitting: Cardiology

## 2020-11-02 VITALS — BP 132/72 | HR 87 | Ht 61.0 in | Wt 128.1 lb

## 2020-11-02 DIAGNOSIS — R011 Cardiac murmur, unspecified: Secondary | ICD-10-CM | POA: Diagnosis not present

## 2020-11-02 DIAGNOSIS — R002 Palpitations: Secondary | ICD-10-CM | POA: Diagnosis not present

## 2020-11-02 DIAGNOSIS — I10 Essential (primary) hypertension: Secondary | ICD-10-CM | POA: Diagnosis not present

## 2020-11-02 DIAGNOSIS — E7800 Pure hypercholesterolemia, unspecified: Secondary | ICD-10-CM | POA: Diagnosis not present

## 2020-11-02 MED ORDER — PRAVASTATIN SODIUM 20 MG PO TABS: 20.0000 mg | ORAL_TABLET | Freq: Every evening | ORAL | 3 refills | Status: DC

## 2020-11-02 NOTE — Patient Instructions (Addendum)
Medication Instructions:  Your physician has recommended you make the following change in your medication:  START: Pravastatin 20 mg take one tablet by mouth daily in the evening.  *If you need a refill on your cardiac medications before your next appointment, please call your pharmacy*   Lab Work: None If you have labs (blood work) drawn today and your tests are completely normal, you will receive your results only by: Marland Kitchen MyChart Message (if you have MyChart) OR . A paper copy in the mail If you have any lab test that is abnormal or we need to change your treatment, we will call you to review the results.   Testing/Procedures: A zio monitor was ordered today. It will remain on for 7 days. You will then return monitor and event diary in provided box. It takes 1-2 weeks for report to be downloaded and returned to [place]. We will call you with the results. If monitor falls off or has orange flashing light, please call Zio for further instructions.   Your physician has requested that you have an echocardiogram. Echocardiography is a painless test that uses sound waves to create images of your heart. It provides your doctor with information about the size and shape of your heart and how well your heart's chambers and valves are working. This procedure takes approximately one hour. There are no restrictions for this procedure.     Follow-Up: At [facility], you and your health needs are our priority.  As part of our continuing mission to provide you with exceptional heart care, we have created designated Provider Care Teams.  These Care Teams include your primary Cardiologist (physician) and Advanced Practice Providers (APPs -  Physician Assistants and Nurse Practitioners) who all work together to provide you with the care you need, when you need it.  We recommend signing up for the patient portal called "MyChart".  Sign up information is provided on this After Visit Summary.  MyChart is used to  connect with patients for Virtual Visits (Telemedicine).  Patients are able to view lab/test results, encounter notes, upcoming appointments, etc.  Non-urgent messages can be sent to your provider as well.   To learn more about what you can do with MyChart, go to [email or web address].    Your next appointment:   6 week(s)  The format for your next appointment:   In Person  Provider:   Shirlee More, MD   Other Instructions

## 2020-11-03 ENCOUNTER — Other Ambulatory Visit: Payer: Self-pay | Admitting: Physician Assistant

## 2020-11-03 DIAGNOSIS — I10 Essential (primary) hypertension: Secondary | ICD-10-CM

## 2020-11-03 DIAGNOSIS — N1832 Chronic kidney disease, stage 3b: Secondary | ICD-10-CM

## 2020-11-05 DIAGNOSIS — H04123 Dry eye syndrome of bilateral lacrimal glands: Secondary | ICD-10-CM | POA: Diagnosis not present

## 2020-11-05 DIAGNOSIS — H3561 Retinal hemorrhage, right eye: Secondary | ICD-10-CM | POA: Diagnosis not present

## 2020-11-05 DIAGNOSIS — H35363 Drusen (degenerative) of macula, bilateral: Secondary | ICD-10-CM | POA: Diagnosis not present

## 2020-11-05 DIAGNOSIS — H524 Presbyopia: Secondary | ICD-10-CM | POA: Diagnosis not present

## 2020-11-05 DIAGNOSIS — Z961 Presence of intraocular lens: Secondary | ICD-10-CM | POA: Diagnosis not present

## 2020-11-05 NOTE — Telephone Encounter (Signed)
Next Visit: 01/07/2021  Last Visit: 08/09/2020  Last Fill: 08/09/2020  DX:  Rheumatoid arthritis involving multiple sites with positive rheumatoid factor   Current Dose per office note 08/09/2020, Arava 20 mg 1 tablet by mouth daily,   benazepril (LOTENSIN) 10 MG tablet    Sig: Take 1 tablet (10 mg total) by mouth daily.    Dispense:  90 tablet    Refill:  0     Labs: 10/15/2020, Hemoglobin 11.8, Neutrophils Relative % 17.5, Lymphocytes Relative 65.8, Monocytes Relative 13.9, Neutro Abs 0.9, GFR 50.77,   Okay to refill Arava and Benazepril?

## 2020-11-07 ENCOUNTER — Ambulatory Visit: Payer: Medicare HMO | Admitting: Family

## 2020-11-07 ENCOUNTER — Other Ambulatory Visit: Payer: Medicare HMO

## 2020-11-09 ENCOUNTER — Ambulatory Visit: Payer: Medicare HMO | Admitting: Family

## 2020-11-09 ENCOUNTER — Other Ambulatory Visit: Payer: Medicare HMO

## 2020-11-09 DIAGNOSIS — R002 Palpitations: Secondary | ICD-10-CM | POA: Diagnosis not present

## 2020-11-13 ENCOUNTER — Ambulatory Visit: Payer: Medicare HMO | Admitting: Family

## 2020-11-13 ENCOUNTER — Other Ambulatory Visit: Payer: Medicare HMO

## 2020-11-13 DIAGNOSIS — R002 Palpitations: Secondary | ICD-10-CM | POA: Diagnosis not present

## 2020-11-16 ENCOUNTER — Telehealth: Payer: Self-pay

## 2020-11-16 ENCOUNTER — Other Ambulatory Visit: Payer: Self-pay | Admitting: Family

## 2020-11-16 DIAGNOSIS — D72820 Lymphocytosis (symptomatic): Secondary | ICD-10-CM

## 2020-11-16 DIAGNOSIS — D709 Neutropenia, unspecified: Secondary | ICD-10-CM

## 2020-11-16 NOTE — Telephone Encounter (Signed)
Left message on patients voicemail to please return our call.   

## 2020-11-16 NOTE — Telephone Encounter (Signed)
-----   Message from Richardo Priest, MD sent at 11/16/2020 12:29 PM EDT ----- Good result monitor is normal no problem with her heart rhythm.

## 2020-11-19 ENCOUNTER — Inpatient Hospital Stay: Payer: Medicare HMO | Attending: Hematology & Oncology

## 2020-11-19 ENCOUNTER — Other Ambulatory Visit: Payer: Self-pay

## 2020-11-19 ENCOUNTER — Telehealth: Payer: Self-pay

## 2020-11-19 ENCOUNTER — Inpatient Hospital Stay (HOSPITAL_BASED_OUTPATIENT_CLINIC_OR_DEPARTMENT_OTHER): Payer: Medicare HMO | Admitting: Family

## 2020-11-19 ENCOUNTER — Encounter: Payer: Self-pay | Admitting: Family

## 2020-11-19 VITALS — BP 123/55 | HR 86 | Temp 98.1°F | Resp 18 | Ht 61.0 in | Wt 126.1 lb

## 2020-11-19 DIAGNOSIS — E039 Hypothyroidism, unspecified: Secondary | ICD-10-CM

## 2020-11-19 DIAGNOSIS — E611 Iron deficiency: Secondary | ICD-10-CM | POA: Diagnosis not present

## 2020-11-19 DIAGNOSIS — Z79890 Hormone replacement therapy (postmenopausal): Secondary | ICD-10-CM | POA: Insufficient documentation

## 2020-11-19 DIAGNOSIS — C9110 Chronic lymphocytic leukemia of B-cell type not having achieved remission: Secondary | ICD-10-CM | POA: Diagnosis not present

## 2020-11-19 DIAGNOSIS — D508 Other iron deficiency anemias: Secondary | ICD-10-CM

## 2020-11-19 DIAGNOSIS — D72820 Lymphocytosis (symptomatic): Secondary | ICD-10-CM | POA: Diagnosis not present

## 2020-11-19 DIAGNOSIS — D709 Neutropenia, unspecified: Secondary | ICD-10-CM

## 2020-11-19 LAB — CMP (CANCER CENTER ONLY)
ALT: 11 U/L (ref 0–44)
AST: 20 U/L (ref 15–41)
Albumin: 4.1 g/dL (ref 3.5–5.0)
Alkaline Phosphatase: 78 U/L (ref 38–126)
Anion gap: 9 (ref 5–15)
BUN: 21 mg/dL (ref 8–23)
CO2: 30 mmol/L (ref 22–32)
Calcium: 10.2 mg/dL (ref 8.9–10.3)
Chloride: 101 mmol/L (ref 98–111)
Creatinine: 1.08 mg/dL — ABNORMAL HIGH (ref 0.44–1.00)
GFR, Estimated: 51 mL/min — ABNORMAL LOW (ref >60)
Glucose, Bld: 182 mg/dL — ABNORMAL HIGH (ref 70–99)
Potassium: 4.4 mmol/L (ref 3.5–5.1)
Sodium: 140 mmol/L (ref 135–145)
Total Bilirubin: 0.5 mg/dL (ref 0.3–1.2)
Total Protein: 7.7 g/dL (ref 6.5–8.1)

## 2020-11-19 LAB — CBC WITH DIFFERENTIAL (CANCER CENTER ONLY)
Abs Immature Granulocytes: 0.01 10*3/uL (ref 0.00–0.07)
Basophils Absolute: 0 10*3/uL (ref 0.0–0.1)
Basophils Relative: 1 %
Eosinophils Absolute: 0 10*3/uL (ref 0.0–0.5)
Eosinophils Relative: 0 %
HCT: 36.8 % (ref 36.0–46.0)
Hemoglobin: 11.4 g/dL — ABNORMAL LOW (ref 12.0–15.0)
Immature Granulocytes: 0 %
Lymphocytes Relative: 83 %
Lymphs Abs: 6.5 10*3/uL — ABNORMAL HIGH (ref 0.7–4.0)
MCH: 25.2 pg — ABNORMAL LOW (ref 26.0–34.0)
MCHC: 31 g/dL (ref 30.0–36.0)
MCV: 81.2 fL (ref 80.0–100.0)
Monocytes Absolute: 0.6 10*3/uL (ref 0.1–1.0)
Monocytes Relative: 8 %
Neutro Abs: 0.6 10*3/uL — ABNORMAL LOW (ref 1.7–7.7)
Neutrophils Relative %: 8 %
Platelet Count: 209 10*3/uL (ref 150–400)
RBC: 4.53 MIL/uL (ref 3.87–5.11)
RDW: 14.5 % (ref 11.5–15.5)
WBC Count: 7.8 10*3/uL (ref 4.0–10.5)
nRBC: 0 % (ref 0.0–0.2)

## 2020-11-19 LAB — SAVE SMEAR(SSMR), FOR PROVIDER SLIDE REVIEW

## 2020-11-19 LAB — LACTATE DEHYDROGENASE: LDH: 190 U/L (ref 98–192)

## 2020-11-19 NOTE — Telephone Encounter (Signed)
Spoke with patient regarding results and recommendation.  Patient verbalizes understanding and is agreeable to plan of care. Advised patient to call back with any issues or concerns.  

## 2020-11-19 NOTE — Telephone Encounter (Signed)
-----   Message from Richardo Priest, MD sent at 11/16/2020 12:29 PM EDT ----- Good result monitor is normal no problem with her heart rhythm.
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
## 2020-11-20 ENCOUNTER — Telehealth: Payer: Self-pay | Admitting: Family

## 2020-11-20 ENCOUNTER — Telehealth: Payer: Self-pay | Admitting: *Deleted

## 2020-11-20 ENCOUNTER — Other Ambulatory Visit: Payer: Self-pay | Admitting: Family

## 2020-11-20 DIAGNOSIS — D509 Iron deficiency anemia, unspecified: Secondary | ICD-10-CM | POA: Insufficient documentation

## 2020-11-20 LAB — IRON AND TIBC
Iron: 58 ug/dL (ref 41–142)
Saturation Ratios: 19 % — ABNORMAL LOW (ref 21–57)
TIBC: 310 ug/dL (ref 236–444)
UIBC: 252 ug/dL (ref 120–384)

## 2020-11-20 LAB — SURGICAL PATHOLOGY

## 2020-11-20 LAB — FERRITIN: Ferritin: 81 ng/mL (ref 11–307)

## 2020-11-20 NOTE — Telephone Encounter (Signed)
Per scheduling message 11/20/20 Judson Roch - gave patient upcoming appointments

## 2020-11-20 NOTE — Telephone Encounter (Signed)
No los 4/18

## 2020-11-21 LAB — FLOW CYTOMETRY

## 2020-11-23 ENCOUNTER — Other Ambulatory Visit: Payer: Self-pay

## 2020-11-23 ENCOUNTER — Inpatient Hospital Stay: Payer: Medicare HMO

## 2020-11-23 VITALS — BP 153/69 | HR 83 | Temp 98.2°F | Resp 18

## 2020-11-23 DIAGNOSIS — D509 Iron deficiency anemia, unspecified: Secondary | ICD-10-CM

## 2020-11-23 DIAGNOSIS — E611 Iron deficiency: Secondary | ICD-10-CM | POA: Diagnosis not present

## 2020-11-23 DIAGNOSIS — Z79890 Hormone replacement therapy (postmenopausal): Secondary | ICD-10-CM | POA: Diagnosis not present

## 2020-11-23 DIAGNOSIS — E039 Hypothyroidism, unspecified: Secondary | ICD-10-CM | POA: Diagnosis not present

## 2020-11-23 DIAGNOSIS — D72820 Lymphocytosis (symptomatic): Secondary | ICD-10-CM | POA: Diagnosis not present

## 2020-11-23 MED ORDER — SODIUM CHLORIDE 0.9 % IV SOLN
Freq: Once | INTRAVENOUS | Status: AC
  Filled 2020-11-23: qty 250

## 2020-11-23 MED ORDER — SODIUM CHLORIDE 0.9 % IV SOLN
125.0000 mg | Freq: Once | INTRAVENOUS | Status: AC
  Administered 2020-11-23: 125 mg via INTRAVENOUS
  Filled 2020-11-23: qty 10

## 2020-11-23 NOTE — Patient Instructions (Signed)
Sodium Ferric Gluconate Complex injection What is this medicine? SODIUM FERRIC GLUCONATE COMPLEX (SOE dee um FER ik GLOO koe nate KOM pleks) is an iron replacement. It is used with epoetin therapy to treat low iron levels in patients who are receiving hemodialysis. This medicine may be used for other purposes; ask your health care provider or pharmacist if you have questions. COMMON BRAND NAME(S): Ferrlecit, Nulecit What should I tell my health care provider before I take this medicine? They need to know if you have any of the following conditions:  anemia that is not from iron deficiency  high levels of iron in the body  an unusual or allergic reaction to iron, benzyl alcohol, other medicines, foods, dyes, or preservatives  pregnant or are trying to become pregnant  breast-feeding How should I use this medicine? This medicine is for infusion into a vein. It is given by a health care professional in a hospital or clinic setting. Talk to your pediatrician regarding the use of this medicine in children. While this drug may be prescribed for children as young as 6 years old for selected conditions, precautions do apply. Overdosage: If you think you have taken too much of this medicine contact a poison control center or emergency room at once. NOTE: This medicine is only for you. Do not share this medicine with others. What if I miss a dose? It is important not to miss your dose. Call your doctor or health care professional if you are unable to keep an appointment. What may interact with this medicine? Do not take this medicine with any of the following medications:  deferoxamine  dimercaprol  other iron products This medicine may also interact with the following medications:  chloramphenicol  deferasirox  medicine for blood pressure like enalapril This list may not describe all possible interactions. Give your health care provider a list of all the medicines, herbs,  non-prescription drugs, or dietary supplements you use. Also tell them if you smoke, drink alcohol, or use illegal drugs. Some items may interact with your medicine. What should I watch for while using this medicine? Your condition will be monitored carefully while you are receiving this medicine. Visit your doctor for check-ups as directed. What side effects may I notice from receiving this medicine? Side effects that you should report to your doctor or health care professional as soon as possible:  allergic reactions like skin rash, itching or hives, swelling of the face, lips, or tongue  breathing problems  changes in hearing  changes in vision  chills, flushing, or sweating  fast, irregular heartbeat  feeling faint or lightheaded, falls  fever, flu-like symptoms  high or low blood pressure  pain, tingling, numbness in the hands or feet  severe pain in the chest, back, flanks, or groin  swelling of the ankles, feet, hands  trouble passing urine or change in the amount of urine  unusually weak or tired Side effects that usually do not require medical attention (report to your doctor or health care professional if they continue or are bothersome):  cramps  dark colored stools  diarrhea  headache  nausea, vomiting  stomach upset This list may not describe all possible side effects. Call your doctor for medical advice about side effects. You may report side effects to FDA at 1-800-FDA-1088. Where should I keep my medicine? This drug is given in a hospital or clinic and will not be stored at home. NOTE: This sheet is a summary. It may not cover all   possible information. If you have questions about this medicine, talk to your doctor, pharmacist, or health care provider.  2021 Elsevier/Gold Standard (2008-03-22 15:58:57)   

## 2020-11-30 ENCOUNTER — Inpatient Hospital Stay: Payer: Medicare HMO

## 2020-11-30 ENCOUNTER — Other Ambulatory Visit: Payer: Self-pay

## 2020-11-30 VITALS — BP 125/78 | HR 80 | Temp 98.1°F | Resp 16

## 2020-11-30 DIAGNOSIS — D72820 Lymphocytosis (symptomatic): Secondary | ICD-10-CM | POA: Diagnosis not present

## 2020-11-30 DIAGNOSIS — E611 Iron deficiency: Secondary | ICD-10-CM | POA: Diagnosis not present

## 2020-11-30 DIAGNOSIS — E039 Hypothyroidism, unspecified: Secondary | ICD-10-CM | POA: Diagnosis not present

## 2020-11-30 DIAGNOSIS — Z79890 Hormone replacement therapy (postmenopausal): Secondary | ICD-10-CM | POA: Diagnosis not present

## 2020-11-30 DIAGNOSIS — D509 Iron deficiency anemia, unspecified: Secondary | ICD-10-CM

## 2020-11-30 MED ORDER — SODIUM CHLORIDE 0.9 % IV SOLN
125.0000 mg | Freq: Once | INTRAVENOUS | Status: AC
  Administered 2020-11-30: 125 mg via INTRAVENOUS
  Filled 2020-11-30: qty 10

## 2020-11-30 MED ORDER — SODIUM CHLORIDE 0.9 % IV SOLN
Freq: Once | INTRAVENOUS | Status: AC
  Filled 2020-11-30: qty 250

## 2020-12-06 ENCOUNTER — Ambulatory Visit (HOSPITAL_COMMUNITY): Payer: Medicare HMO | Attending: Cardiovascular Disease

## 2020-12-06 ENCOUNTER — Other Ambulatory Visit: Payer: Self-pay

## 2020-12-06 DIAGNOSIS — R011 Cardiac murmur, unspecified: Secondary | ICD-10-CM

## 2020-12-06 LAB — ECHOCARDIOGRAM COMPLETE
AR max vel: 1.52 cm2
AV Area VTI: 1.38 cm2
AV Area mean vel: 1.39 cm2
AV Mean grad: 14.1 mmHg
AV Peak grad: 25.6 mmHg
Ao pk vel: 2.53 m/s
Area-P 1/2: 2.52 cm2
S' Lateral: 1.7 cm

## 2020-12-13 ENCOUNTER — Ambulatory Visit: Payer: Medicare HMO | Admitting: Cardiology

## 2020-12-13 ENCOUNTER — Encounter: Payer: Self-pay | Admitting: Cardiology

## 2020-12-13 ENCOUNTER — Other Ambulatory Visit: Payer: Self-pay

## 2020-12-13 VITALS — BP 132/74 | HR 94 | Ht 61.0 in | Wt 128.0 lb

## 2020-12-13 DIAGNOSIS — R011 Cardiac murmur, unspecified: Secondary | ICD-10-CM

## 2020-12-13 DIAGNOSIS — E7800 Pure hypercholesterolemia, unspecified: Secondary | ICD-10-CM | POA: Diagnosis not present

## 2020-12-13 DIAGNOSIS — I10 Essential (primary) hypertension: Secondary | ICD-10-CM

## 2020-12-13 DIAGNOSIS — R002 Palpitations: Secondary | ICD-10-CM | POA: Diagnosis not present

## 2020-12-13 NOTE — Progress Notes (Signed)
Cardiology Office Note:    Date:  12/13/2020   ID:  [name] [name], DOB 07-28-1935, MRN [id number]  PCP:  Libby Maw, MD  Cardiologist:  Shirlee More, MD    Referring MD: Libby Maw,*    ASSESSMENT:    1. Palpitations   2. Heart murmur   3. Essential hypertension   4. Elevated LDL cholesterol level    PLAN:    In order of problems listed above:  1. Testing is reassuring her son is present she is not having palpitation and no arrhythmia noticed concern on her event monitor. 2. Mild aortic stenosis needs follow-up echocardiogram in 2 years I will ask her PCP to order if there is been progression follow-up with me 3. Stable at target continue treatment ACE inhibitor 4. Stable continue her statin   Next appointment: I will see back in the office as needed, if her aortic stenosis has progressed moderate and like to see her in 2 years.   Medication Adjustments/Labs and Tests Ordered: Current medicines are reviewed at length with the patient today.  Concerns regarding medicines are outlined above.  No orders of the defined types were placed in this encounter.  No orders of the defined types were placed in this encounter.   Chief Complaint  Patient presents with  . Follow-up    After testing     History of Present Illness:    [name] [name] is a 86 y.o. female with a hx of hypertension CKD hyperlipidemia hypothyroidism last seen 11/02/2020 for rapid heart rate.  Compliance with diet, lifestyle and medications: Yes  Her son is present participates in evaluation decision making. I reviewed the echocardiogram with him which shows very mild aortic stenosis is not a risk of valve intervention in the intermediate future and I will ask her PCP to work on follow-up echocardiogram in 2 years. She is doing well is having no palpitation edema shortness of breath or chest pain.  Echocardiogram 07/09/2019 2022 shows normal left ventricular size and  function and mild aortic stenosis in the gradient 14 mm of mercury. Event monitor 7 days showed rare ventricular and supraventricular ectopy without triggered events. Past Medical History:  Diagnosis Date  . HYPERLIPIDEMIA 01/19/2007  . HYPERTENSION 01/19/2007  . HYPOTHYROIDISM 01/19/2007  . Rheumatoid arthritis(714.0) 01/19/2007    Past Surgical History:  Procedure Laterality Date  . ABDOMINAL HYSTERECTOMY    . APPENDECTOMY    . CHOLECYSTECTOMY    . NECK SURGERY      Current Medications: Current Meds  Medication Sig  . benazepril (LOTENSIN) 10 MG tablet TAKE 1 TABLET BY MOUTH EVERY DAY  . Echinacea-Goldenseal (IMMUNE HEALTH BLEND PO) Take 500 mg by mouth daily.  Marland Kitchen leflunomide (ARAVA) 20 MG tablet TAKE 1 TABLET BY MOUTH EVERY DAY  . levothyroxine (SYNTHROID) 75 MCG tablet TAKE 1 TABLET BY MOUTH EVERY DAY  . Multiple Minerals-Vitamins (CALCIUM CITRATE PLUS/MAGNESIUM) TABS Take 1 tablet by mouth daily.  . Multiple Vitamins-Minerals (MULTIVITAMIN WITH MINERALS) tablet Take 1 tablet by mouth daily.  Vladimir Faster Glycol-Propyl Glycol (SYSTANE OP) Apply to eye daily.  . pravastatin (PRAVACHOL) 20 MG tablet Take 1 tablet (20 mg total) by mouth every evening.     Allergies:   Tramadol and Codeine phosphate   Social History   Socioeconomic History  . Marital status: Married    Spouse name: Not on file  . Number of children: Not on file  . Years of education: Not on file  . Highest  education level: Not on file  Occupational History  . Not on file  Tobacco Use  . Smoking status: Never Smoker  . Smokeless tobacco: Never Used  Vaping Use  . Vaping Use: Never used  Substance and Sexual Activity  . Alcohol use: No  . Drug use: No  . Sexual activity: Not Currently  Other Topics Concern  . Not on file  Social History Narrative  . Not on file   Social Determinants of Health   Financial Resource Strain: Not on file  Food Insecurity: Not on file  Transportation Needs: Not on file   Physical Activity: Not on file  Stress: Not on file  Social Connections: Not on file     Family History: The patient's family history includes Cancer in her sister; Diabetes in her father; Heart attack in her brother, brother, and brother; Heart disease in her father and sister. ROS:   Please see the history of present illness.    All other systems reviewed and are negative.  EKGs/Labs/Other Studies Reviewed:    The following studies were reviewed today:   Recent Labs: 10/15/2020: TSH 1.49 11/19/2020: ALT 11; BUN 21; Creatinine 1.08; Hemoglobin 11.4; Platelet Count 209; Potassium 4.4; Sodium 140  Recent Lipid Panel    Component Value Date/Time   CHOL 281 (H) 02/03/2019 1129   TRIG 161 (H) 02/03/2019 1129   TRIG 101 07/01/2006 1014   HDL 93 02/03/2019 1129   CHOLHDL 3.0 02/03/2019 1129   CHOLHDL 4 11/13/2016 0840   VLDL 37.0 11/13/2016 0840   LDLCALC 156 (H) 02/03/2019 1129   LDLDIRECT 161 (H) 02/03/2019 1129   LDLDIRECT 176.0 04/09/2011 0943    Physical Exam:    VS:  BP 132/74 (BP Location: Right Arm, Patient Position: Sitting, Cuff Size: Normal)   Pulse 94   Ht 5\' 1"  (1.549 m)   Wt 128 lb (58.1 kg)   SpO2 96%   BMI 24.19 kg/m     Wt Readings from Last 3 Encounters:  12/13/20 128 lb (58.1 kg)  11/19/20 126 lb 1.9 oz (57.2 kg)  11/02/20 128 lb 1.9 oz (58.1 kg)     GEN: Appears her age well nourished, well developed in no acute distress HEENT: Normal NECK: No JVD; No carotid bruits LYMPHATICS: No lymphadenopathy CARDIAC: RRR, she has a soft grade 1/6 midsystolic ejection murmur aortic area S2 is normal no AR, rubs, gallops RESPIRATORY:  Clear to auscultation without rales, wheezing or rhonchi  ABDOMEN: Soft, non-tender, non-distended MUSCULOSKELETAL:  No edema; No deformity  SKIN: Warm and dry NEUROLOGIC:  Alert and oriented x 3 PSYCHIATRIC:  Normal affect    Signed, Shirlee More, MD  12/13/2020 12:28 PM    [facility]

## 2020-12-13 NOTE — Patient Instructions (Signed)

## 2020-12-24 NOTE — Progress Notes (Signed)
Office Visit Note  Patient: [name] [name]             Date of Birth: 30-Sep-1934           MRN: [id number]             PCP: Libby Maw, MD Referring: Libby Maw,* Visit Date: 01/07/2021 Occupation: @GUAROCC @  Subjective:  Medication Management   History of Present Illness: [name] [name] is a 86 y.o. female with a history of rheumatoid arthritis, osteoarthritis and degenerative disc disease.  She states her symptoms are very well controlled on leflunomide 20 mg p.o. daily.  She denies any joint pain or joint swelling.  She denies any discomfort in her neck or lower back.  Activities of Daily Living:  Patient reports morning stiffness for 0 minutes.   Patient Denies nocturnal pain.  Difficulty dressing/grooming: Denies Difficulty climbing stairs: Denies Difficulty getting out of chair: Denies Difficulty using hands for taps, buttons, cutlery, and/or writing: Denies  Review of Systems  Constitutional: Negative for fatigue.  HENT: Negative for mouth sores, mouth dryness and nose dryness.   Eyes: Negative for pain, itching and dryness.  Respiratory: Negative for shortness of breath and difficulty breathing.   Cardiovascular: Negative for chest pain and palpitations.  Gastrointestinal: Negative for blood in stool, constipation and diarrhea.  Endocrine: Negative for increased urination.  Genitourinary: Negative for difficulty urinating.  Musculoskeletal: Negative for arthralgias, joint pain, joint swelling, myalgias, morning stiffness, muscle tenderness and myalgias.  Skin: Negative for color change, rash and redness.  Allergic/Immunologic: Negative for susceptible to infections.  Neurological: Negative for dizziness, numbness, headaches, memory loss and weakness.  Hematological: Negative for bruising/bleeding tendency.  Psychiatric/Behavioral: Negative for confusion.    PMFS History:  Patient Active Problem List   Diagnosis Date Noted  . Iron  deficiency anemia 11/20/2020  . Lymphocytosis 10/16/2020  . Neutropenia ([place]) 10/16/2020  . Paroxysmal supraventricular tachycardia ([place]) 10/15/2020  . Elevated LDL cholesterol level 01/12/2020  . DDD (degenerative disc disease), cervical 07/13/2018  . Chronic kidney disease (CKD) stage G3b 01/21/2017  . History of hypothyroidism 01/20/2017  . History of congestive heart failure 08/26/2016  . Osteopenia of multiple sites 08/25/2016  . DDD cervical spine 08/25/2016  . Primary osteoarthritis of both hands 08/25/2016  . High risk medication use 06/11/2016  . CHF, acute ([place]) 02/01/2013  . 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 Hypothyroidism 01/19/2007  . Dyslipidemia 01/19/2007  . Essential hypertension 01/19/2007  . Rheumatoid arthritis ([place]) 01/19/2007    Past Medical History:  Diagnosis Date  . HYPERLIPIDEMIA 01/19/2007  . HYPERTENSION 01/19/2007  . HYPOTHYROIDISM 01/19/2007  . Rheumatoid arthritis(714.0) 01/19/2007    Family History  Problem Relation Age of Onset  . Diabetes Father   . Heart disease Father   . Cancer Sister        breast   . Heart attack Brother   . Heart attack Brother   . Heart attack Brother   . Heart disease Sister    Past Surgical History:  Procedure Laterality Date  . ABDOMINAL HYSTERECTOMY    . APPENDECTOMY    . CHOLECYSTECTOMY    . NECK SURGERY     Social History   Social History Narrative  . Not on file   Immunization History  Administered Date(s) Administered  . Fluad Quad(high Dose 65+) 05/06/2019  . Influenza,inj,Quad PF,6+ Mos 07/06/2013  . Pneumococcal Conjugate-13 07/06/2013  . Pneumococcal Polysaccharide-23 04/09/2011, 06/21/2012  . Tdap 04/09/2011     Objective:  Vital Signs: BP 108/69 (BP Location: Left Arm, Patient Position: Sitting, Cuff Size: Normal)   Pulse (!) 101   Resp 14   Ht 5' 1.5" (1.562 m)   Wt 127 lb 3.2 oz (57.7 kg)   BMI 23.65 kg/m    Physical Exam Vitals and nursing note reviewed.  Constitutional:       Appearance: She is well-developed.  HENT:     Head: Normocephalic and atraumatic.  Eyes:     Conjunctiva/sclera: Conjunctivae normal.  Cardiovascular:     Rate and Rhythm: Normal rate and regular rhythm.     Heart sounds: Normal heart sounds.  Pulmonary:     Effort: Pulmonary effort is normal.     Breath sounds: Normal breath sounds.  Abdominal:     General: Bowel sounds are normal.     Palpations: Abdomen is soft.  Musculoskeletal:     Cervical back: Normal range of motion.  Lymphadenopathy:     Cervical: No cervical adenopathy.  Skin:    General: Skin is warm and dry.     Capillary Refill: Capillary refill takes less than 2 seconds.  Neurological:     Mental Status: She is alert and oriented to person, place, and time.  Psychiatric:        Behavior: Behavior normal.      Musculoskeletal Exam: C-spine was in good range of motion.  Shoulder joints, elbow joints, wrist joints, MCPs PIPs and DIPs with good range of motion with no synovitis.  Hip joints, knee joints, ankles, MTPs and PIPs with good range of motion with no synovitis.  CDAI Exam: CDAI Score: 0  Patient Global: 0 mm; Provider Global: 0 mm Swollen: 0 ; Tender: 0  Joint Exam 01/07/2021   No joint exam has been documented for this visit   There is currently no information documented on the homunculus. Go to the Rheumatology activity and complete the homunculus joint exam.  Investigation: No additional findings.  Imaging: No results found.  Recent Labs: Lab Results  Component Value Date   WBC 7.8 11/19/2020   HGB 11.4 (L) 11/19/2020   PLT 209 11/19/2020   NA 140 11/19/2020   K 4.4 11/19/2020   CL 101 11/19/2020   CO2 30 11/19/2020   GLUCOSE 182 (H) 11/19/2020   BUN 21 11/19/2020   CREATININE 1.08 (H) 11/19/2020   BILITOT 0.5 11/19/2020   ALKPHOS 78 11/19/2020   AST 20 11/19/2020   ALT 11 11/19/2020   PROT 7.7 11/19/2020   ALBUMIN 4.1 11/19/2020   CALCIUM 10.2 11/19/2020   GFRAA 50 (L)  08/09/2020    Speciality Comments: PLQ Eye Exam: 10/04/2019 @ [facility]  Prior therapy: Methotrexate (lung injury)   Procedures:  No procedures performed Allergies: Tramadol and Codeine phosphate   Assessment / Plan:     Visit Diagnoses: Rheumatoid arthritis involving multiple sites with positive rheumatoid factor (HCC)-patient is clinically doing well and had no synovitis on my examination today.  She denies having a rheumatoid arthritis flare in a long time.  I advised her to get Areva 20 mg tablets into half to try if she can tolerate that without any flares.  If she does well on Arava 10 mg p.o. daily then we can reduce her further dose.  High risk medication use - Arava 20 mg 1 tablet by mouth daily.  Her labs have been stable.  I advised her to take multivitamin with iron for chronic anemia.  Her GFR is low and stable.  Primary  osteoarthritis of both hands-she has some underlying osteoarthritis which is not bothersome currently.  DDD (degenerative disc disease), cervical-she had good range of motion of her cervical spine spine without discomfort.  Osteopenia of multiple sites -schedule DEXA scan.  She could not go for previous DEXA which was ordered.  Other medical problems are listed as follows:  Dyslipidemia  Chronic kidney disease (CKD) stage G3b  History of hypertension  History of hypothyroidism  History of congestive heart failure  Essential hypertension  Orders: No orders of the defined types were placed in this encounter.  No orders of the defined types were placed in this encounter.    Follow-Up Instructions: Return in about 5 months (around 06/09/2021) for Rheumatoid arthritis.   Bo Merino, MD  Note - This record has been created using Editor, commissioning.  Chart creation errors have been sought, but may not always  have been located. Such creation errors do not reflect on  the standard of medical care.

## 2021-01-04 ENCOUNTER — Telehealth: Payer: Self-pay | Admitting: Family Medicine

## 2021-01-04 NOTE — Telephone Encounter (Signed)
Left message for patient to call back and schedule Medicare Annual Wellness Visit (AWV).   Please offer to do virtually or by telephone.   Last AWV:10/05/2019   Please schedule at anytime with Nurse Health Advisor.

## 2021-01-07 ENCOUNTER — Ambulatory Visit: Payer: Medicare HMO | Admitting: Rheumatology

## 2021-01-07 ENCOUNTER — Encounter: Payer: Self-pay | Admitting: Rheumatology

## 2021-01-07 ENCOUNTER — Other Ambulatory Visit: Payer: Self-pay

## 2021-01-07 VITALS — BP 108/69 | HR 101 | Resp 14 | Ht 61.5 in | Wt 127.2 lb

## 2021-01-07 DIAGNOSIS — M5030 Other cervical disc degeneration, unspecified cervical region: Secondary | ICD-10-CM | POA: Diagnosis not present

## 2021-01-07 DIAGNOSIS — M0579 Rheumatoid arthritis with rheumatoid factor of multiple sites without organ or systems involvement: Secondary | ICD-10-CM

## 2021-01-07 DIAGNOSIS — Z8639 Personal history of other endocrine, nutritional and metabolic disease: Secondary | ICD-10-CM

## 2021-01-07 DIAGNOSIS — Z79899 Other long term (current) drug therapy: Secondary | ICD-10-CM | POA: Diagnosis not present

## 2021-01-07 DIAGNOSIS — M19042 Primary osteoarthritis, left hand: Secondary | ICD-10-CM

## 2021-01-07 DIAGNOSIS — M7061 Trochanteric bursitis, right hip: Secondary | ICD-10-CM

## 2021-01-07 DIAGNOSIS — E785 Hyperlipidemia, unspecified: Secondary | ICD-10-CM

## 2021-01-07 DIAGNOSIS — M19041 Primary osteoarthritis, right hand: Secondary | ICD-10-CM | POA: Diagnosis not present

## 2021-01-07 DIAGNOSIS — M8589 Other specified disorders of bone density and structure, multiple sites: Secondary | ICD-10-CM

## 2021-01-07 DIAGNOSIS — Z8679 Personal history of other diseases of the circulatory system: Secondary | ICD-10-CM | POA: Diagnosis not present

## 2021-01-07 DIAGNOSIS — I10 Essential (primary) hypertension: Secondary | ICD-10-CM | POA: Diagnosis not present

## 2021-01-07 DIAGNOSIS — N1832 Chronic kidney disease, stage 3b: Secondary | ICD-10-CM | POA: Diagnosis not present

## 2021-01-07 NOTE — Addendum Note (Signed)
Addended by: Francis Gaines C on: 01/07/2021 11:11 AM   Modules accepted: Orders

## 2021-01-07 NOTE — Patient Instructions (Signed)
Standing Labs We placed an order today for your standing lab work.   Please have your standing labs drawn in July and every 3 months  If possible, please have your labs drawn 2 weeks prior to your appointment so that the provider can discuss your results at your appointment.  Please note that you may see your imaging and lab results in [place] before we have reviewed them. We may be awaiting multiple results to interpret others before contacting you. Please allow our office up to 72 hours to thoroughly review all of the results before contacting the office for clarification of your results.  We have open lab daily: Monday through Thursday from 1:30-4:30 PM and Friday from 1:30-4:00 PM at the office of Dr. Bo Merino, [facility].   Please be advised, all patients with office appointments requiring lab work will take precedent over walk-in lab work.  If possible, please come for your lab work on Monday and Friday afternoons, as you may experience shorter wait times. The office is located at [address], [place], [place], [place] [address] No appointment is necessary.   Labs are drawn by [facility]. Please bring your co-pay at the time of your lab draw.  You may receive a bill from [facility] for your lab work.  If you wish to have your labs drawn at another location, please call the office 24 hours in advance to send orders.  If you have any questions regarding directions or hours of operation,  please call [phone].   As a reminder, please drink plenty of water prior to coming for your lab work. Thanks!  Vaccines You are taking a medication(s) that can suppress your immune system.  The following immunizations are recommended: . Flu annually . Covid-19  . Td/Tdap (tetanus, diphtheria, pertussis) every 10 years . Pneumonia (Prevnar 15 then Pneumovax 23 at least 1 year apart.  Alternatively, can take Prevnar 20 without needing additional dose) . Shingrix (after age 66):  2 doses from 4 weeks to 6 months apart  Please check with your PCP to make sure you are up to date.  If you test POSITIVE for COVID19 and have MILD to MODERATE symptoms: o First, call your PCP if you would like to receive COVID19 treatment AND o Hold your medications during the infection and for at least 1 week after your symptoms have resolved: - Injectable medication (Benlysta, Cimzia, Cosentyx, Enbrel, Humira, Orencia, Remicade, Simponi, Stelara, Taltz, Tremfya) - Methotrexate - Leflunomide (Arava) - Mycophenolate (Cellcept) - Morrie Sheldon, Olumiant, or Rinvoq o If you take Actemra or Kevzara, you DO NOT need to hold these for COVID19 infection.  If you test POSITIVE for COVID19 and have NO symptoms: o First, call your PCP if you would like to receive COVID19 treatment AND o Hold your medications for at least 10 days after the day that you tested positive - Injectable medication (Benlysta, Cimzia, Cosentyx, Enbrel, Humira, Orencia, Remicade, Simponi, Stelara, Taltz, Tremfya) - Methotrexate - Leflunomide (Arava) - Mycophenolate (Cellcept) - Morrie Sheldon, Olumiant, or Rinvoq o If you take Actemra or Kevzara, you DO NOT need to hold these for COVID19 infection.  If you have signs or symptoms of an infection or start antibiotics: . First, call your PCP for workup of your infection. . Hold your medication through the infection, until you complete your antibiotics, and until symptoms resolve if you take the following: o Injectable medication (Actemra, Benlysta, Cimzia, Cosentyx, Enbrel, Humira, Kevzara, Orencia, Remicade, Simponi, Stelara, Taltz, Tremfya) o Methotrexate o Leflunomide (  Arava) o Mycophenolate (Cellcept) o Morrie Sheldon, Olumiant, or Rinvoq  Heart Disease Prevention   Your inflammatory disease increases your risk of heart disease which includes heart attack, stroke, atrial fibrillation (irregular heartbeats), high blood pressure, heart failure and atherosclerosis (plaque in the  arteries).  It is important to reduce your risk by:   . Keep blood pressure, cholesterol, and blood sugar at healthy levels   . Smoking Cessation   . Maintain a healthy weight  o BMI 20-25   . Eat a healthy diet  o Plenty of fresh fruit, vegetables, and whole grains  o Limit saturated fats, foods high in sodium, and added sugars  o DASH and Mediterranean diet   . Increase physical activity  o Recommend moderate physically activity for 150 minutes per week/ 30 minutes a day for five days a week These can be broken up into three separate ten-minute sessions during the day.   . Reduce Stress  . Meditation, slow breathing exercises, yoga, coloring books  . Dental visits twice a year

## 2021-01-08 ENCOUNTER — Ambulatory Visit: Payer: Medicare HMO | Admitting: Rheumatology

## 2021-01-18 ENCOUNTER — Inpatient Hospital Stay: Payer: Medicare HMO | Attending: Hematology & Oncology

## 2021-01-18 ENCOUNTER — Inpatient Hospital Stay: Payer: Medicare HMO | Admitting: Hematology & Oncology

## 2021-01-25 ENCOUNTER — Other Ambulatory Visit: Payer: Self-pay | Admitting: Family Medicine

## 2021-01-25 DIAGNOSIS — E039 Hypothyroidism, unspecified: Secondary | ICD-10-CM

## 2021-01-31 ENCOUNTER — Telehealth: Payer: Self-pay | Admitting: *Deleted

## 2021-01-31 NOTE — Chronic Care Management (AMB) (Signed)
  Chronic Care Management   Outreach Note  01/31/2021 Name: MIASIA CRABTREE MRN: [id number] DOB: 11-10-1934  Lillia Pauls Buttermore is a 86 y.o. year old female who is a primary care patient of Libby Maw, MD. I reached out to [facility] by phone today in response to a referral sent by Ms. Lillia Pauls Champagne's PCP, Libby Maw, MD.      An unsuccessful telephone outreach was attempted today. The patient was referred to the case management team for assistance with care management and care coordination.   Follow Up Plan: A HIPAA compliant phone message was left for the patient providing contact information and requesting a return call. The care management team will reach out to the patient again over the next 7 days.  If patient returns call to provider office, please advise to call [place] at [phone].  [place] Management

## 2021-02-06 ENCOUNTER — Other Ambulatory Visit: Payer: Self-pay | Admitting: *Deleted

## 2021-02-06 DIAGNOSIS — N1832 Chronic kidney disease, stage 3b: Secondary | ICD-10-CM

## 2021-02-06 DIAGNOSIS — I10 Essential (primary) hypertension: Secondary | ICD-10-CM

## 2021-02-06 MED ORDER — BENAZEPRIL HCL 10 MG PO TABS: 10.0000 mg | ORAL_TABLET | Freq: Every day | ORAL | 0 refills | Status: DC

## 2021-02-06 NOTE — Telephone Encounter (Signed)
Refill request received via fax  Next Visit: 06/11/2021  Last Visit: 01/07/2021  Last Fill: 11/05/2020  Current Dose per office note on 01/07/2021: not discussed   Okay to refill benazepril?

## 2021-02-06 NOTE — Addendum Note (Signed)
Addended by: Abelino Derrick A on: 02/06/2021 10:08 AM   Modules accepted: Orders

## 2021-02-07 NOTE — Chronic Care Management (AMB) (Signed)
  Chronic Care Management   Outreach Note  02/07/2021 Name: [name] [name] MRN: [id number] DOB: 10-30-1934  [name] [name] is a 86 y.o. year old female who is a primary care patient of Libby Maw, MD. I reached out to [facility] by phone today in response to a referral sent by [name]'s PCP, Libby Maw, MD      A second unsuccessful telephone outreach was attempted today. The patient was referred to the case management team for assistance with care management and care coordination.   Follow Up Plan: A HIPAA compliant phone message was left for the patient providing contact information and requesting a return call. The care management team will reach out to the patient again over the next 7 days.  If patient returns call to provider office, please advise to call [place] at [phone].  [place] Management

## 2021-02-14 NOTE — Chronic Care Management (AMB) (Signed)
  Chronic Care Management   Outreach Note  02/14/2021 Name: [name] MRN: [id number] DOB: [birth year]  [name] [name] is a 86 y.o. year old female who is a primary care patient of Libby Maw, MD. I reached out to [facility] by phone today in response to a referral sent by [name]'s PCP, Libby Maw, MD      A second unsuccessful telephone outreach was attempted today. The patient was referred to the case management team for assistance with care management and care coordination.   Follow Up Plan: A HIPAA compliant phone message was left for the patient providing contact information and requesting a return call. The care management team will reach out to the patient again over the next 7 days.  If patient returns call to provider office, please advise to call [place] at [phone].  [place] Management

## 2021-02-20 NOTE — Chronic Care Management (AMB) (Signed)
  Chronic Care Management   Outreach Note  02/20/2021 Name: [name] [name] MRN: [id number] DOB: 01-Jan-1935  [name] [name] is a 86 y.o. year old female who is a primary care patient of Libby Maw, MD. I reached out to [facility] by phone today in response to a referral sent by [name] [name]'s PCP, Libby Maw, MD      Third unsuccessful telephone outreach was attempted today. The patient was referred to the case management team for assistance with care management and care coordination. The patient's primary care provider has been notified of our unsuccessful attempts to make or maintain contact with the patient. The care management team is pleased to engage with this patient at any time in the future should he/she be interested in assistance from the care management team.   Follow Up Plan: We have been unable to make contact with the patient. The care management team is available to follow up with the patient after provider conversation with the patient regarding recommendation for care management engagement and subsequent re-referral to the care management team.  A HIPAA compliant phone message was left for the patient providing contact information and requesting a return call. If patient returns call to provider office, please advise to call [place] at [phone].  [place] Management

## 2021-02-20 NOTE — Chronic Care Management (AMB) (Signed)
  Chronic Care Management   Note  02/20/2021 Name: [name] [name] MRN: [id number] DOB: 16-Aug-1934  [name] [name] is a 86 y.o. year old female who is a primary care patient of Libby Maw, MD. I reached out to [facility] by phone today in response to a referral sent by [name] [name] PCP, Dr. Ethelene Hal.      [name] was given information about Chronic Care Management services today including:  CCM service includes personalized support from designated clinical staff supervised by her physician, including individualized plan of care and coordination with other care providers 24/7 contact phone numbers for assistance for urgent and routine care needs. Service will only be billed when office clinical staff spend 20 minutes or more in a month to coordinate care. Only one practitioner may furnish and bill the service in a calendar month. The patient may stop CCM services at any time (effective at the end of the month) by phone call to the office staff. The patient will be responsible for cost sharing (co-pay) of up to 20% of the service fee (after annual deductible is met).  Son Genevra Orne  verbally agreed to assistance and services provided by embedded care coordination/care management team today.  Follow up plan: Telephone appointment with care management team member scheduled for:03/13/2021  [place] Management

## 2021-03-13 ENCOUNTER — Telehealth: Payer: Medicare HMO

## 2021-03-13 ENCOUNTER — Telehealth: Payer: Self-pay

## 2021-03-13 NOTE — Telephone Encounter (Signed)
  Care Management   Follow Up Note   03/13/2021 Name: [name] [name] MRN: [id number] DOB: Sep 28, 1934   Referred by: Libby Maw, MD Reason for referral : Chronic Care Management (Initial assessment outreach)   An unsuccessful telephone outreach was attempted today. The patient was referred to the case management team for assistance with care management and care coordination.   Follow Up Plan: The care management team will reach out to the patient again over the next 45 days.   Quinn Plowman RN,BSN,CCM RN Case Manager [place] [phone]

## 2021-04-10 ENCOUNTER — Telehealth: Payer: Self-pay

## 2021-04-10 ENCOUNTER — Telehealth: Payer: Medicare HMO

## 2021-04-10 NOTE — Telephone Encounter (Signed)
  Care Management   Follow Up Note   04/10/2021 Name: [name] [name] MRN: [id number] DOB: 1934/11/30   Referred by: Libby Maw, MD Reason for referral : Chronic Care Management (Initial assessment)   A second unsuccessful telephone outreach was attempted today. The patient was referred to the case management team for assistance with care management and care coordination.  Telephone call to patient x 2.  Unable to reach.   Follow Up Plan: HIPPA compliant phone messages left for the patient providing contact information and requesting a return call.   Quinn Plowman RN,BSN,CCM RN Case Manager [place] [phone]

## 2021-04-12 ENCOUNTER — Telehealth: Payer: Self-pay

## 2021-04-12 NOTE — Telephone Encounter (Signed)
Lft VM to rtn call to schedule an AWV.  Dm/cma

## 2021-04-17 ENCOUNTER — Other Ambulatory Visit: Payer: Self-pay

## 2021-04-18 ENCOUNTER — Other Ambulatory Visit: Payer: Self-pay | Admitting: Physician Assistant

## 2021-04-18 ENCOUNTER — Ambulatory Visit (INDEPENDENT_AMBULATORY_CARE_PROVIDER_SITE_OTHER): Payer: Medicare HMO | Admitting: Family Medicine

## 2021-04-18 ENCOUNTER — Other Ambulatory Visit: Payer: Self-pay

## 2021-04-18 ENCOUNTER — Encounter: Payer: Self-pay | Admitting: Family Medicine

## 2021-04-18 VITALS — BP 106/68 | HR 97 | Temp 97.3°F | Ht 61.0 in | Wt 125.6 lb

## 2021-04-18 DIAGNOSIS — E039 Hypothyroidism, unspecified: Secondary | ICD-10-CM

## 2021-04-18 DIAGNOSIS — Z8679 Personal history of other diseases of the circulatory system: Secondary | ICD-10-CM

## 2021-04-18 DIAGNOSIS — N1832 Chronic kidney disease, stage 3b: Secondary | ICD-10-CM

## 2021-04-18 DIAGNOSIS — E7800 Pure hypercholesterolemia, unspecified: Secondary | ICD-10-CM | POA: Diagnosis not present

## 2021-04-18 DIAGNOSIS — R7309 Other abnormal glucose: Secondary | ICD-10-CM

## 2021-04-18 DIAGNOSIS — Z23 Encounter for immunization: Secondary | ICD-10-CM

## 2021-04-18 LAB — BASIC METABOLIC PANEL
BUN: 23 mg/dL (ref 6–23)
CO2: 28 mEq/L (ref 19–32)
Calcium: 10 mg/dL (ref 8.4–10.5)
Chloride: 105 mEq/L (ref 96–112)
Creatinine, Ser: 1.24 mg/dL — ABNORMAL HIGH (ref 0.40–1.20)
GFR: 40.02 mL/min — ABNORMAL LOW (ref >60.00)
Glucose, Bld: 95 mg/dL (ref 70–99)
Potassium: 5 mEq/L (ref 3.5–5.1)
Sodium: 143 mEq/L (ref 135–145)

## 2021-04-18 LAB — URINALYSIS, ROUTINE W REFLEX MICROSCOPIC
Bilirubin Urine: NEGATIVE
Hgb urine dipstick: NEGATIVE
Ketones, ur: NEGATIVE
Nitrite: NEGATIVE
RBC / HPF: NONE SEEN (ref 0–?)
Specific Gravity, Urine: 1.02 (ref 1.000–1.030)
Total Protein, Urine: NEGATIVE
Urine Glucose: NEGATIVE
Urobilinogen, UA: 0.2 (ref 0.0–1.0)
pH: 6 (ref 5.0–8.0)

## 2021-04-18 LAB — LIPID PANEL
Cholesterol: 226 mg/dL — ABNORMAL HIGH (ref 0–200)
HDL: 47.7 mg/dL (ref >39.00)
NonHDL: 178.61
Total CHOL/HDL Ratio: 5
Triglycerides: 226 mg/dL — ABNORMAL HIGH (ref 0.0–149.0)
VLDL: 45.2 mg/dL — ABNORMAL HIGH (ref 0.0–40.0)

## 2021-04-18 LAB — TSH: TSH: 2.14 u[IU]/mL (ref 0.35–5.50)

## 2021-04-18 LAB — HEMOGLOBIN A1C: Hgb A1c MFr Bld: 5.7 % (ref 4.6–6.5)

## 2021-04-18 LAB — MICROALBUMIN / CREATININE URINE RATIO
Creatinine,U: 221.3 mg/dL
Microalb Creat Ratio: 0.4 mg/g (ref 0.0–30.0)
Microalb, Ur: 1 mg/dL (ref 0.0–1.9)

## 2021-04-18 LAB — LDL CHOLESTEROL, DIRECT: Direct LDL: 129 mg/dL

## 2021-04-22 ENCOUNTER — Encounter: Payer: Self-pay | Admitting: Family Medicine

## 2021-04-22 NOTE — Progress Notes (Signed)
Established Patient Office Visit  Subjective:  Patient ID: [name] [name], female    DOB: 08/12/1934  Age: 86 y.o. MRN: [id number]  CC:  Chief Complaint  Patient presents with   Follow-up    6 month follow up, no concerns patient fasting.     HPI [facility] presents for follow-up of hypothyroidism, elevated cholesterol, elevated glucose, and history of CHF.  Follow-up echocardiogram is pending.  History of aortic stenosis being followed up with pending echocardiogram.  Continues to take the levothyroxine in the morning before breakfast.  She is taking it daily.  Having no issues taking pravastatin.  Elevated glucose with recent blood work.  No history of diabetes.  Hemoglobin A1c's have been normal.  Continues to use MiraLAX as needed for constipation.  No problems with urination.  Denies  shortness of breath or chest pain.  Past Medical History:  Diagnosis Date   HYPERLIPIDEMIA 01/19/2007   HYPERTENSION 01/19/2007   HYPOTHYROIDISM 01/19/2007   Rheumatoid arthritis(714.0) 01/19/2007    Past Surgical History:  Procedure Laterality Date   ABDOMINAL HYSTERECTOMY     APPENDECTOMY     CHOLECYSTECTOMY     NECK SURGERY      Family History  Problem Relation Age of Onset   Diabetes Father    Heart disease Father    Cancer Sister        breast    Heart attack Brother    Heart attack Brother    Heart attack Brother    Heart disease Sister     Social History   Socioeconomic History   Marital status: Married    Spouse name: Not on file   Number of children: Not on file   Years of education: Not on file   Highest education level: Not on file  Occupational History   Not on file  Tobacco Use   Smoking status: Never   Smokeless tobacco: Never  Vaping Use   Vaping Use: Never used  Substance and Sexual Activity   Alcohol use: No   Drug use: No   Sexual activity: Not Currently  Other Topics Concern   Not on file  Social History Narrative   Not on file   Social  Determinants of Health   Financial Resource Strain: Not on file  Food Insecurity: Not on file  Transportation Needs: Not on file  Physical Activity: Not on file  Stress: Not on file  Social Connections: Not on file  Intimate Partner Violence: Not on file    Outpatient Medications Prior to Visit  Medication Sig Dispense Refill   benazepril (LOTENSIN) 10 MG tablet Take 1 tablet (10 mg total) by mouth daily. 90 tablet 0   Echinacea-Goldenseal (IMMUNE HEALTH BLEND PO) Take 500 mg by mouth daily.     leflunomide (ARAVA) 20 MG tablet TAKE 1 TABLET BY MOUTH EVERY DAY 90 tablet 0   levothyroxine (SYNTHROID) 75 MCG tablet TAKE 1 TABLET BY MOUTH EVERY DAY 90 tablet 3   Multiple Minerals-Vitamins (CALCIUM CITRATE PLUS/MAGNESIUM) TABS Take 1 tablet by mouth daily.     Multiple Vitamins-Minerals (MULTIVITAMIN WITH MINERALS) tablet Take 1 tablet by mouth daily.     Polyethyl Glycol-Propyl Glycol (SYSTANE OP) Apply to eye daily.     pravastatin (PRAVACHOL) 20 MG tablet Take 1 tablet (20 mg total) by mouth every evening. 90 tablet 3   No facility-administered medications prior to visit.    Allergies  Allergen Reactions   Tramadol Nausea Only   Codeine Phosphate  Nausea Only    REACTION: unspecified    ROS Review of Systems  Constitutional:  Negative for diaphoresis, fatigue, fever and unexpected weight change.  HENT: Negative.    Eyes:  Negative for photophobia and visual disturbance.  Respiratory: Negative.    Cardiovascular: Negative.   Gastrointestinal: Negative.   Endocrine: Negative for polyphagia and polyuria.  Genitourinary:  Negative for difficulty urinating, dysuria, frequency and urgency.  Musculoskeletal:  Negative for gait problem and joint swelling.  Neurological:  Negative for speech difficulty and weakness.  Psychiatric/Behavioral: Negative.       Objective:    Physical Exam Vitals and nursing note reviewed.  Constitutional:      General: She is not in acute  distress.    Appearance: Normal appearance. She is normal weight. She is not ill-appearing, toxic-appearing or diaphoretic.  HENT:     Head: Normocephalic and atraumatic.     Right Ear: Tympanic membrane, ear canal and external ear normal.     Left Ear: Tympanic membrane, ear canal and external ear normal.     Mouth/Throat:     Mouth: Mucous membranes are moist.     Pharynx: Oropharynx is clear. No oropharyngeal exudate or posterior oropharyngeal erythema.  Eyes:     General: No scleral icterus.       Right eye: No discharge.        Left eye: No discharge.     Extraocular Movements: Extraocular movements intact.     Conjunctiva/sclera: Conjunctivae normal.     Pupils: Pupils are equal, round, and reactive to light.  Neck:     Vascular: No carotid bruit.  Cardiovascular:     Rate and Rhythm: Normal rate and regular rhythm.  Pulmonary:     Effort: Pulmonary effort is normal.     Breath sounds: Normal breath sounds. No rhonchi.  Abdominal:     General: Bowel sounds are normal.  Musculoskeletal:     Cervical back: No rigidity.     Right lower leg: No edema.     Left lower leg: No edema.  Lymphadenopathy:     Cervical: No cervical adenopathy.  Skin:    General: Skin is warm and dry.  Neurological:     Mental Status: She is alert and oriented to person, place, and time.  Psychiatric:        Mood and Affect: Mood normal.        Behavior: Behavior normal.    BP 106/68 (BP Location: Right Arm, Patient Position: Sitting, Cuff Size: Normal)   Pulse 97   Temp (!) 97.3 F (36.3 C) (Temporal)   Ht 5\' 1"  (1.549 m)   Wt 125 lb 9.6 oz (57 kg)   SpO2 96%   BMI 23.73 kg/m  Wt Readings from Last 3 Encounters:  04/18/21 125 lb 9.6 oz (57 kg)  01/07/21 127 lb 3.2 oz (57.7 kg)  12/13/20 128 lb (58.1 kg)     Health Maintenance Due  Topic Date Due   Zoster Vaccines- Shingrix (1 of 2) Never done   MAMMOGRAM  07/23/2016   TETANUS/TDAP  04/08/2021    There are no preventive care  reminders to display for this patient.  Lab Results  Component Value Date   TSH 2.14 04/18/2021   Lab Results  Component Value Date   WBC 7.8 11/19/2020   HGB 11.4 (L) 11/19/2020   HCT 36.8 11/19/2020   MCV 81.2 11/19/2020   PLT 209 11/19/2020   Lab Results  Component Value Date  NA 143 04/18/2021   K 5.0 04/18/2021   CO2 28 04/18/2021   GLUCOSE 95 04/18/2021   BUN 23 04/18/2021   CREATININE 1.24 (H) 04/18/2021   BILITOT 0.5 11/19/2020   ALKPHOS 78 11/19/2020   AST 20 11/19/2020   ALT 11 11/19/2020   PROT 7.7 11/19/2020   ALBUMIN 4.1 11/19/2020   CALCIUM 10.0 04/18/2021   ANIONGAP 9 11/19/2020   GFR 40.02 (L) 04/18/2021   Lab Results  Component Value Date   CHOL 226 (H) 04/18/2021   Lab Results  Component Value Date   HDL 47.70 04/18/2021   Lab Results  Component Value Date   LDLCALC 156 (H) 02/03/2019   Lab Results  Component Value Date   TRIG 226.0 (H) 04/18/2021   Lab Results  Component Value Date   CHOLHDL 5 04/18/2021   Lab Results  Component Value Date   HGBA1C 5.7 04/18/2021      Assessment & Plan:   Problem List Items Addressed This Visit       Endocrine   Hypothyroidism   Relevant Orders   TSH (Completed)     Genitourinary   Chronic kidney disease (CKD) stage G3b   Relevant Orders   Urinalysis, Routine w reflex microscopic (Completed)   Basic metabolic panel (Completed)   Microalbumin / creatinine urine ratio (Completed)     Other   History of congestive heart failure   Elevated LDL cholesterol level   Relevant Orders   Lipid panel (Completed)   LDL cholesterol, direct (Completed)   Need for influenza vaccination - Primary   Relevant Orders   Flu Vaccine QUAD High Dose(Fluad) (Completed)   Other Visit Diagnoses     Elevated glucose       Relevant Orders   Hemoglobin A1c (Completed)       No orders of the defined types were placed in this encounter.   Follow-up: Return in about 6 months (around 10/16/2021).     Libby Maw, MD

## 2021-05-01 ENCOUNTER — Ambulatory Visit: Payer: Medicare HMO

## 2021-05-01 NOTE — Patient Instructions (Signed)
Visit Information  Thank you for allowing me to share the care management and care coordination services that are available to you as part of your health plan and services through your primary care provider and medical home. Please reach out to me at [phone] if the care management/care coordination team may be of assistance to you in the future.   Lateefah Mallery RN,BSN,CCM RN Case Manager [place] [phone]  

## 2021-05-01 NOTE — Chronic Care Management (AMB) (Signed)
  Care Management   Outreach Note  05/01/2021 Name: [name] [name] MRN: [id number] DOB: 07-Oct-1934  Referred by: Libby Maw, MD Reason for referral : Chronic Care Management (Initial assessment attempt #3)   Successful contact was made with the patient to discuss care management and care coordination services. Patient declines engagement at this time.  Telephone call to patients son/ designated party release, [employer].  HIPAA verified by son for patient. Son declines services at this time.   Follow Up Plan:  The patient's son has been provided with contact information for the care management team and has been advised to call with any health related questions or concerns.   Quinn Plowman RN,BSN,CCM RN Case Manager [place]  [phone]

## 2021-05-06 ENCOUNTER — Other Ambulatory Visit: Payer: Self-pay | Admitting: Physician Assistant

## 2021-05-06 DIAGNOSIS — N1832 Chronic kidney disease, stage 3b: Secondary | ICD-10-CM

## 2021-05-06 DIAGNOSIS — I10 Essential (primary) hypertension: Secondary | ICD-10-CM

## 2021-05-06 NOTE — Telephone Encounter (Signed)
This prescription was refilled for the patient at her last OV due to her not being able to get a hold of her PCP.  She should try reaching out to PCP for refill to ensure she still requires this medication and at this dose.

## 2021-05-06 NOTE — Telephone Encounter (Signed)
Next Visit: 06/11/2021   Last Visit: 01/07/2021   Last Fill: 02/06/2021   Current Dose per office note on 01/07/2021: not discussed    Okay to refill benazepril?

## 2021-05-06 NOTE — Telephone Encounter (Signed)
Attempted to contact the patient and left message for patient to call the office.  

## 2021-05-17 ENCOUNTER — Telehealth: Payer: Self-pay

## 2021-05-17 ENCOUNTER — Other Ambulatory Visit: Payer: Self-pay | Admitting: Family Medicine

## 2021-05-17 DIAGNOSIS — I10 Essential (primary) hypertension: Secondary | ICD-10-CM

## 2021-05-17 DIAGNOSIS — Z79899 Other long term (current) drug therapy: Secondary | ICD-10-CM

## 2021-05-17 DIAGNOSIS — N1832 Chronic kidney disease, stage 3b: Secondary | ICD-10-CM

## 2021-05-17 NOTE — Telephone Encounter (Signed)
Patient's son [name] [name] called requesting prescription refill of Leflunomide 20 mg and Benazepril 10 mg to be sent to [facility] [address] in [place].  [name] [name] states he called her PCP and was told he needed to contact our office for the refill of Benazepril.  They state [name] prescribed it on 08/09/20, 11/05/20, and 02/06/21.

## 2021-05-17 NOTE — Telephone Encounter (Signed)
What is the name of the medication? benazepril (LOTENSIN) 10 MG tablet [[id number]]   Have you contacted your pharmacy to request a refill? Pt's son [name] is calling to request a refill on this script. I do see where Ofilia Neas, PA-C  filled it the last three times. They called the office of that provider and were told to call her PCP. She has been out for a week.   Which pharmacy would you like this sent to? [id number] [address], [place]  [place], [place] [address]  Phone:  [phone]  Fax:  [phone]  DEA #:  [id number]   Patient notified that their request is being sent to the clinical staff for review and that they should receive a call once it is complete. If they do not receive a call within 72 hours they can check with their pharmacy or our office.

## 2021-05-17 NOTE — Telephone Encounter (Signed)
Todd advised this prescription was refilled for the patient at her last OV due to her not being able to get a hold of her PCP.  She should try reaching out to PCP for refill to ensure she still requires this medication and at this dose. He will contact PCP. Patient is overdue for labs. [name] will bring her on Monday to have labs updated.

## 2021-05-20 ENCOUNTER — Encounter: Payer: Self-pay | Admitting: Family

## 2021-05-20 ENCOUNTER — Other Ambulatory Visit: Payer: Self-pay

## 2021-05-20 DIAGNOSIS — Z79899 Other long term (current) drug therapy: Secondary | ICD-10-CM

## 2021-05-20 MED ORDER — BENAZEPRIL HCL 10 MG PO TABS: 10.0000 mg | ORAL_TABLET | Freq: Every day | ORAL | 1 refills | Status: DC

## 2021-05-20 NOTE — Telephone Encounter (Signed)
Refill request for pending Rx last refill 02/06/21 last OV 04/18/21. Please advise

## 2021-05-21 ENCOUNTER — Telehealth: Payer: Self-pay | Admitting: *Deleted

## 2021-05-21 DIAGNOSIS — Z79899 Other long term (current) drug therapy: Secondary | ICD-10-CM

## 2021-05-21 LAB — COMPLETE METABOLIC PANEL WITH GFR
AG Ratio: 1.1 (calc) (ref 1.0–2.5)
ALT: 11 U/L (ref 6–29)
AST: 21 U/L (ref 10–35)
Albumin: 4 g/dL (ref 3.6–5.1)
Alkaline phosphatase (APISO): 69 U/L (ref 37–153)
BUN/Creatinine Ratio: 16 (calc) (ref 6–22)
BUN: 16 mg/dL (ref 7–25)
CO2: 28 mmol/L (ref 20–32)
Calcium: 9.7 mg/dL (ref 8.6–10.4)
Chloride: 104 mmol/L (ref 98–110)
Creat: 1 mg/dL — ABNORMAL HIGH (ref 0.60–0.95)
Globulin: 3.6 g/dL (calc) (ref 1.9–3.7)
Glucose, Bld: 87 mg/dL (ref 65–99)
Potassium: 4.6 mmol/L (ref 3.5–5.3)
Sodium: 139 mmol/L (ref 135–146)
Total Bilirubin: 0.4 mg/dL (ref 0.2–1.2)
Total Protein: 7.6 g/dL (ref 6.1–8.1)
eGFR: 56 mL/min/{1.73_m2} — ABNORMAL LOW (ref >=60)

## 2021-05-21 LAB — CBC WITH DIFFERENTIAL/PLATELET
Absolute Monocytes: 707 cells/uL (ref 200–950)
Basophils Absolute: 37 cells/uL (ref 0–200)
Basophils Relative: 0.3 %
Eosinophils Absolute: 12 cells/uL — ABNORMAL LOW (ref 15–500)
Eosinophils Relative: 0.1 %
HCT: 36.5 % (ref 35.0–45.0)
Hemoglobin: 11.6 g/dL — ABNORMAL LOW (ref 11.7–15.5)
Lymphs Abs: 11346 cells/uL — ABNORMAL HIGH (ref 850–3900)
MCH: 25.2 pg — ABNORMAL LOW (ref 27.0–33.0)
MCHC: 31.8 g/dL — ABNORMAL LOW (ref 32.0–36.0)
MCV: 79.3 fL — ABNORMAL LOW (ref 80.0–100.0)
MPV: 9.6 fL (ref 7.5–12.5)
Monocytes Relative: 5.7 %
Neutro Abs: 298 cells/uL — CL (ref 1500–7800)
Neutrophils Relative %: 2.4 %
Platelets: 222 10*3/uL (ref 140–400)
RBC: 4.6 10*6/uL (ref 3.80–5.10)
RDW: 13.9 % (ref 11.0–15.0)
Total Lymphocyte: 91.5 %
WBC: 12.4 10*3/uL — ABNORMAL HIGH (ref 3.8–10.8)

## 2021-05-21 NOTE — Telephone Encounter (Signed)
-----   Message from Bo Merino, MD sent at 05/21/2021  1:07 PM EDT ----- Please notify patient that her kidney functions are stable.  Her lymphocyte count is high and neutrophil count is low.  She should discontinue leflunomide.  Please repeat CBC in 1 week

## 2021-05-21 NOTE — Progress Notes (Signed)
Please notify patient that her kidney functions are stable.  Her lymphocyte count is high and neutrophil count is low.  She should discontinue leflunomide.  Please repeat CBC in 1 week

## 2021-05-22 ENCOUNTER — Telehealth: Payer: Self-pay | Admitting: *Deleted

## 2021-05-22 NOTE — Telephone Encounter (Signed)
She may take Tylenol.  I would avoid adding a new medication as it will interfere with the lab work.  She may come in in 1 week for the repeat labs as she has been off medication for 1 week already.

## 2021-05-22 NOTE — Telephone Encounter (Signed)
Patient's son, [name], advised she may take Tylenol.  Dr. Estanislado Pandy would avoid adding a new medication as it will interfere with the lab work.  She may come in in 1 week for the repeat labs as she has been off medication for 1 week already. He states he will have her back here next week for labs.

## 2021-05-22 NOTE — Telephone Encounter (Signed)
We advised patient to discontinue Arava after labs. Patient has already been off of [place] for 1 week already due to her needing to update labs. Patient is unable to use hands. Patient is also having Bilateral shoulder pain. Patient's son would like to know if there is anything she can take in the meantime. Please advise.

## 2021-05-27 ENCOUNTER — Other Ambulatory Visit: Payer: Self-pay | Admitting: *Deleted

## 2021-05-27 DIAGNOSIS — Z79899 Other long term (current) drug therapy: Secondary | ICD-10-CM | POA: Diagnosis not present

## 2021-05-27 LAB — CBC WITH DIFFERENTIAL/PLATELET
Absolute Monocytes: 794 cells/uL (ref 200–950)
Basophils Absolute: 46 cells/uL (ref 0–200)
Basophils Relative: 0.4 %
Eosinophils Absolute: 12 cells/uL — ABNORMAL LOW (ref 15–500)
Eosinophils Relative: 0.1 %
HCT: 34.9 % — ABNORMAL LOW (ref 35.0–45.0)
Hemoglobin: 11 g/dL — ABNORMAL LOW (ref 11.7–15.5)
Lymphs Abs: 10109 cells/uL — ABNORMAL HIGH (ref 850–3900)
MCH: 25.1 pg — ABNORMAL LOW (ref 27.0–33.0)
MCHC: 31.5 g/dL — ABNORMAL LOW (ref 32.0–36.0)
MCV: 79.5 fL — ABNORMAL LOW (ref 80.0–100.0)
MPV: 9.6 fL (ref 7.5–12.5)
Monocytes Relative: 6.9 %
Neutro Abs: 541 cells/uL — ABNORMAL LOW (ref 1500–7800)
Neutrophils Relative %: 4.7 %
Platelets: 219 10*3/uL (ref 140–400)
RBC: 4.39 10*6/uL (ref 3.80–5.10)
RDW: 13.9 % (ref 11.0–15.0)
Total Lymphocyte: 87.9 %
WBC: 11.5 10*3/uL — ABNORMAL HIGH (ref 3.8–10.8)

## 2021-05-28 ENCOUNTER — Other Ambulatory Visit: Payer: Self-pay | Admitting: *Deleted

## 2021-05-28 DIAGNOSIS — R899 Unspecified abnormal finding in specimens from other organs, systems and tissues: Secondary | ICD-10-CM

## 2021-05-28 NOTE — Progress Notes (Signed)
Lymphocyte count is a still elevated.  I left a message for patient on the answering machine to call back.  Please refer her to hematology for the evaluation of lymphocytosis (urgent referral).  She should continue to hold leflunomide for now.

## 2021-05-31 NOTE — Progress Notes (Signed)
Office Visit Note  Patient: [name] [name]             Date of Birth: 29-Jan-1935           MRN: [id number]             PCP: Libby Maw, MD Referring: Libby Maw,* Visit Date: 06/11/2021 Occupation: '@[email or web address]' @  Subjective:  Pain in multiple joints  History of Present Illness: [name] [name] is a 86 y.o. female with history of seropositive rheumatoid arthritis and osteoarthritis.  Patient was accompanied by her son today. Patient was recently evaluated by Dr. 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  She has been cleared to resume leflunomide as prescribed.  She restarted on clindamycin on Thursday and has been tolerating it without any side effects.  She has been experiencing pain in both hands and both shoulder joints for the past 1 month while off the leflunomide.  She has been taking Tylenol 650 mg every 8 hours as needed for pain relief.  She has been experiencing nocturnal pain and difficulty with ADLs due to the severity of her pain and stiffness. She is also experiencing some neck stiffness but denies any symptoms of radiculopathy.  She is also been experiencing increased fatigue on a daily basis.  She is being scheduled for an iron infusion ordered by Dr. Marin Olp.   Activities of Daily Living:  Patient reports morning stiffness for 40  minutes.   Patient Reports nocturnal pain.  Difficulty dressing/grooming: Denies Difficulty climbing stairs: Denies Difficulty getting out of chair: Denies Difficulty using hands for taps, buttons, cutlery, and/or writing: Reports  Review of Systems  Constitutional:  Negative for fatigue.  HENT:  Negative for mouth sores, mouth dryness and nose dryness.   Eyes:  Positive for dryness. Negative for pain and visual disturbance.  Respiratory:  Negative for cough, hemoptysis, shortness of breath and difficulty breathing.   Cardiovascular:  Negative for chest  pain, palpitations, hypertension and swelling in legs/feet.  Gastrointestinal:  Negative for blood in stool, constipation and diarrhea.  Endocrine: Negative for excessive thirst and increased urination.  Genitourinary:  Negative for difficulty urinating and painful urination.  Musculoskeletal:  Positive for joint pain, gait problem, joint pain and morning stiffness. Negative for joint swelling, myalgias, muscle weakness, muscle tenderness and myalgias.  Skin:  Negative for color change, pallor, rash, hair loss, nodules/bumps, skin tightness, ulcers and sensitivity to sunlight.  Allergic/Immunologic: Negative for susceptible to infections.  Neurological:  Negative for dizziness, numbness, headaches and weakness.  Hematological:  Negative for bruising/bleeding tendency and swollen glands.  Psychiatric/Behavioral:  Positive for sleep disturbance. Negative for depressed mood. The patient is not nervous/anxious.    PMFS History:  Patient Active Problem List   Diagnosis Date Noted   Need for influenza vaccination 04/18/2021   Iron deficiency anemia 11/20/2020   Lymphocytosis 10/16/2020   Neutropenia ([place]) 10/16/2020   Paroxysmal supraventricular tachycardia (HCC) 10/15/2020   Elevated LDL cholesterol level 01/12/2020   DDD (degenerative disc disease), cervical 07/13/2018   Chronic kidney disease (CKD) stage G3b 01/21/2017   History of hypothyroidism 01/20/2017   History of congestive heart failure 08/26/2016   Osteopenia of multiple sites 08/25/2016   DDD cervical spine 08/25/2016   Primary osteoarthritis of both hands 08/25/2016   High risk medication use 06/11/2016   CHF, acute ([place]) 02/01/2013   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   Hypothyroidism 01/19/2007   Dyslipidemia 01/19/2007   Essential  hypertension 01/19/2007   Rheumatoid arthritis ([place]) 01/19/2007    Past Medical History:  Diagnosis Date   HYPERLIPIDEMIA 01/19/2007   HYPERTENSION 01/19/2007   HYPOTHYROIDISM 01/19/2007    Rheumatoid arthritis(714.0) 01/19/2007    Family History  Problem Relation Age of Onset   Diabetes Father    Heart disease Father    Cancer Sister        breast    Heart disease Sister    Heart attack Brother    Heart attack Brother    Heart attack Brother    Past Surgical History:  Procedure Laterality Date   ABDOMINAL HYSTERECTOMY     APPENDECTOMY     CHOLECYSTECTOMY     NECK SURGERY     Social History   Social History Narrative   Not on file   Immunization History  Administered Date(s) Administered   Fluad Quad(high Dose 65+) 05/06/2019, 04/18/2021   Influenza,inj,Quad PF,6+ Mos 07/06/2013   Pneumococcal Conjugate-13 07/06/2013   Pneumococcal Polysaccharide-23 04/09/2011, 06/21/2012   Tdap 04/09/2011     Objective: Vital Signs: BP 123/69 (BP Location: Left Arm, Patient Position: Sitting, Cuff Size: Normal)   Pulse 94   Resp 16   Ht '5\' 1"'  (1.549 m)   Wt 123 lb (55.8 kg)   BMI 23.24 kg/m    Physical Exam Vitals and nursing note reviewed.  Constitutional:      Appearance: She is well-developed.  HENT:     Head: Normocephalic and atraumatic.  Eyes:     Conjunctiva/sclera: Conjunctivae normal.  Pulmonary:     Effort: Pulmonary effort is normal.  Abdominal:     Palpations: Abdomen is soft.  Musculoskeletal:     Cervical back: Normal range of motion.  Skin:    General: Skin is warm and dry.     Capillary Refill: Capillary refill takes less than 2 seconds.  Neurological:     Mental Status: She is alert and oriented to person, place, and time.  Psychiatric:        Behavior: Behavior normal.     Musculoskeletal Exam: C-spine has limited ROM with lateral rotation.  Postural thoracic kyphosis.  No midline spinal tenderness.  No SI joint tenderness. Painful ROM of both shoulder joints.  Elbow joints have good ROM with no tenderness or joint swelling.  Limited extension of both wrist.  Tenderness and inflammation over both CMC joints.  Tenderness and synovitis of  the right 3rd and 4th PIPs and left 2nd MCP and PIP and 3rd PIP joint.  Hip joints have good ROM with no discomfort.  Knee joints have good ROM with no warmth or effusion.  Ankle joints have good ROM with no tenderness or joint swelling.   CDAI Exam: CDAI Score: 13.5  Patient Global: 9 mm; Provider Global: 6 mm Swollen: 7 ; Tender: 9  Joint Exam 06/11/2021      Right  Left  Glenohumeral   Tender   Tender  CMC  Swollen Tender  Swollen Tender  MCP 2     Swollen Tender  PIP 2     Swollen Tender  PIP 3  Swollen Tender  Swollen Tender  PIP 4  Swollen Tender        Investigation: No additional findings.  Imaging: No results found.  Recent Labs: Lab Results  Component Value Date   WBC 9.8 06/04/2021   HGB 11.0 (L) 06/04/2021   PLT 229 06/04/2021   NA 136 06/04/2021   K 4.2 06/04/2021   CL 102 06/04/2021  CO2 25 06/04/2021   GLUCOSE 127 (H) 06/04/2021   BUN 16 06/04/2021   CREATININE 1.01 (H) 06/04/2021   BILITOT 0.4 06/04/2021   ALKPHOS 71 06/04/2021   AST 42 (H) 06/04/2021   ALT 30 06/04/2021   PROT 7.8 06/04/2021   ALBUMIN 4.0 06/04/2021   CALCIUM 10.0 06/04/2021   GFRAA 50 (L) 08/09/2020    Speciality Comments: PLQ Eye Exam: 10/04/2019 @ [facility]  Prior therapy: Methotrexate (lung injury)   Procedures:  No procedures performed Allergies: Tramadol and Codeine phosphate      Assessment / Plan:     Visit Diagnoses: Rheumatoid arthritis involving multiple sites with positive rheumatoid factor ([place]): She presents today with pain and stiffness in both shoulders and both hands.  She has tenderness and synovitis of several MCP and PIP joints as described above.  She has been having difficulty with ADLs as well as nocturnal pain while being off of [place] for the past 1 month.  On 05/20/2021 her neutrophil count was 298 and absolute lymphocyte count was 11,346 at which time she was advised to hold [place].  She established care with Dr. Marin Olp and has undergone a  thorough work-up.  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  She restarted on arava 20 mg 1 tablet by mouth daily on 06/06/21 after being cleared by Dr. Marin Olp.  She has not noticed much improvement in her joint pain and inflammation since restarting on Arava.  A prednisone taper starting at 10 mg tapering by 2.5 mg every week was sent to the pharmacy today.  We discussed the importance of taking prednisone in the morning with food.  She was also advised to avoid all NSAID use while taking prednisone.  She can continue to take [place] as prescribed.  She was advised to notify us if her discomfort persists or worsens.  She will follow-up in the office in 2 months.   High risk medication use - Arava 20 mg 1 tablet by mouth daily.  Previous therapy: MTX (d/c due to elevated creatinine) and Plaquenil.   CBC and CMP updated on 06/04/2021.  Lab work is being followed closely by Dr. Marin Olp.  She will be undergoing a bone marrow biopsy for further evaluation of lymphocytosis as well as neutropenia.  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 She has not had any recent infections.  Discussed the importance of holding [place] if she develops signs or symptoms of an infection and to resume once the infection is completely cleared. She has not received the COVID-19 vaccine doses and does not plan to.  Primary osteoarthritis of both hands: She has PIP and DIP thickening consistent with osteoarthritis of both hands.  Tenderness and inflammation of both CMC joints noted.  Complete fist formation noted bilaterally.  She has been experiencing increased pain and stiffness in both hands secondary to rheumatoid arthritis flare.  A prednisone taper was sent to the pharmacy today.  Discussed the importance of joint protection and muscle strengthening. DDD (degenerative disc disease), cervical  Osteopenia of  multiple sites - DEXA order is in place.   Lymphocytosis: Absolute lymphs 8.6 on 06/05/2021.  She has established care with Dr. Marin Olp.  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  She will be undergoing a bone marrow biopsy and further lab monitoring.  Other neutropenia ([place]): Absolute neutrophils 298 on 05/20/2021.  At that time she  was advised to hold [place].  She has established care with Dr. Marin Olp.  She has been found to have profound neutropenia as well as lymphocytosis.  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 She has been cleared to resume [place] as prescribed.  She restarted Arava 06/06/2021.  She will continue to have close lab monitoring ordered by Dr. Marin Olp.  History of iron deficiency anemia: Iron was 34, TIBC 283, saturation ratios 12, and UIBC 249 on 06/04/2021.  She will be having an iron infusion soon.  Other medical conditions are listed as follows:   Dyslipidemia  Essential hypertension - BP was 123/69 today in the office.  History of congestive heart failure  Chronic kidney disease (CKD) stage G3b: Was 1.01 and GFR was 55 on 06/04/2021.  History of hypothyroidism  Orders: No orders of the defined types were placed in this encounter.  Meds ordered this encounter  Medications   predniSONE (DELTASONE) 5 MG tablet    Sig: Take 2 tablets by mouth daily x1 wk, 1.5 tablets daily x1 wk, 1 tablet daily x1wk, half tablet daily x1wk.    Dispense:  35 tablet    Refill:  0       Follow-Up Instructions: Return in about 3 months (around 09/11/2021) for Rheumatoid arthritis, Osteoarthritis.   Ofilia Neas, PA-C  Note - This record has been created using Dragon software.  Chart creation errors have been sought, but may not always  have been located. Such creation errors do not reflect on  the standard of medical care.

## 2021-06-04 ENCOUNTER — Inpatient Hospital Stay: Payer: Medicare HMO | Admitting: Hematology & Oncology

## 2021-06-04 ENCOUNTER — Other Ambulatory Visit: Payer: Self-pay

## 2021-06-04 ENCOUNTER — Encounter: Payer: Self-pay | Admitting: Hematology & Oncology

## 2021-06-04 ENCOUNTER — Inpatient Hospital Stay: Payer: Medicare HMO | Attending: Hematology & Oncology

## 2021-06-04 VITALS — BP 146/73 | HR 94 | Temp 99.3°F | Resp 17 | Wt 124.0 lb

## 2021-06-04 DIAGNOSIS — C9110 Chronic lymphocytic leukemia of B-cell type not having achieved remission: Secondary | ICD-10-CM

## 2021-06-04 DIAGNOSIS — M069 Rheumatoid arthritis, unspecified: Secondary | ICD-10-CM | POA: Diagnosis not present

## 2021-06-04 DIAGNOSIS — D708 Other neutropenia: Secondary | ICD-10-CM | POA: Diagnosis not present

## 2021-06-04 DIAGNOSIS — D509 Iron deficiency anemia, unspecified: Secondary | ICD-10-CM | POA: Insufficient documentation

## 2021-06-04 DIAGNOSIS — D709 Neutropenia, unspecified: Secondary | ICD-10-CM | POA: Insufficient documentation

## 2021-06-04 DIAGNOSIS — D508 Other iron deficiency anemias: Secondary | ICD-10-CM

## 2021-06-04 LAB — CMP (CANCER CENTER ONLY)
ALT: 30 U/L (ref 0–44)
AST: 42 U/L — ABNORMAL HIGH (ref 15–41)
Albumin: 4 g/dL (ref 3.5–5.0)
Alkaline Phosphatase: 71 U/L (ref 38–126)
Anion gap: 9 (ref 5–15)
BUN: 16 mg/dL (ref 8–23)
CO2: 25 mmol/L (ref 22–32)
Calcium: 10 mg/dL (ref 8.9–10.3)
Chloride: 102 mmol/L (ref 98–111)
Creatinine: 1.01 mg/dL — ABNORMAL HIGH (ref 0.44–1.00)
GFR, Estimated: 55 mL/min — ABNORMAL LOW (ref >60)
Glucose, Bld: 127 mg/dL — ABNORMAL HIGH (ref 70–99)
Potassium: 4.2 mmol/L (ref 3.5–5.1)
Sodium: 136 mmol/L (ref 135–145)
Total Bilirubin: 0.4 mg/dL (ref 0.3–1.2)
Total Protein: 7.8 g/dL (ref 6.5–8.1)

## 2021-06-04 LAB — CBC WITH DIFFERENTIAL (CANCER CENTER ONLY)
Abs Immature Granulocytes: 0 10*3/uL (ref 0.00–0.07)
Basophils Absolute: 0 10*3/uL (ref 0.0–0.1)
Basophils Relative: 0 %
Eosinophils Absolute: 0 10*3/uL (ref 0.0–0.5)
Eosinophils Relative: 0 %
HCT: 35.3 % — ABNORMAL LOW (ref 36.0–46.0)
Hemoglobin: 11 g/dL — ABNORMAL LOW (ref 12.0–15.0)
Immature Granulocytes: 0 %
Lymphocytes Relative: 87 %
Lymphs Abs: 8.6 10*3/uL — ABNORMAL HIGH (ref 0.7–4.0)
MCH: 24.9 pg — ABNORMAL LOW (ref 26.0–34.0)
MCHC: 31.2 g/dL (ref 30.0–36.0)
MCV: 79.9 fL — ABNORMAL LOW (ref 80.0–100.0)
Monocytes Absolute: 0.7 10*3/uL (ref 0.1–1.0)
Monocytes Relative: 8 %
Neutro Abs: 0.5 10*3/uL — ABNORMAL LOW (ref 1.7–7.7)
Neutrophils Relative %: 5 %
Platelet Count: 229 10*3/uL (ref 150–400)
RBC: 4.42 MIL/uL (ref 3.87–5.11)
RDW: 14.6 % (ref 11.5–15.5)
WBC Count: 9.8 10*3/uL (ref 4.0–10.5)
nRBC: 0 % (ref 0.0–0.2)

## 2021-06-04 LAB — SAVE SMEAR(SSMR), FOR PROVIDER SLIDE REVIEW

## 2021-06-04 LAB — RETICULOCYTES
Immature Retic Fract: 10.4 % (ref 2.3–15.9)
RBC.: 4.43 MIL/uL (ref 3.87–5.11)
Retic Count, Absolute: 51.4 10*3/uL (ref 19.0–186.0)
Retic Ct Pct: 1.2 % (ref 0.4–3.1)

## 2021-06-04 LAB — LACTATE DEHYDROGENASE: LDH: 197 U/L — ABNORMAL HIGH (ref 98–192)

## 2021-06-04 NOTE — Progress Notes (Signed)
Hematology and Oncology Follow Up Visit  PATI THINNES [id number] Feb 01, 1935 86 y.o. 06/04/2021   Principle Diagnosis:  Profound neutropenia; rheumatoid arthritis  Current Therapy:   Observation     Interim History:  Ms. Lartigue is in for second office visit.  We first saw her back in April.  At that time, she had profound neutropenia with a absolute neutrophil count of about 400.  She has bad rheumatoid arthritis.  Her rheumatologist will not prescribe her rheumatoid arthritis medicine because of the low white cells.  We did do a flow cytometry on her back in April.  This did not show any monoclonal population of cells.  Again, her biggest issue is the rheumatoid arthritis.  She been off her medications for over a month.  Again her rheumatologist will not prescribe the medication for her.  I will have to talk to her to see if this can be done.  I wonder if she does not have myelodysplasia.  I think that were going had to have a bone marrow biopsy done to try to sort this out.  Given her age, it would not surprise me.  I do not see any medication that she is on that would cause this profound neutropenia.  She has had no problems infections.  There is been no rashes.  Her appetite is not all that great.  She lives by herself.  She has had no problems with COVID.  She has had no weight loss or weight gain.  She has had no change in bowel or bladder habits.  She has had no bleeding.  Overall, I would say her performance status is ECOG 2.    Medications:  Current Outpatient Medications:    acetaminophen (TYLENOL) 650 MG CR tablet, Take 650 mg by mouth every 8 (eight) hours as needed for pain., Disp: , Rfl:    benazepril (LOTENSIN) 10 MG tablet, Take 1 tablet (10 mg total) by mouth daily., Disp: 90 tablet, Rfl: 1   Echinacea-Goldenseal (IMMUNE HEALTH BLEND PO), Take 500 mg by mouth daily., Disp: , Rfl:    levothyroxine (SYNTHROID) 75 MCG tablet, TAKE 1 TABLET BY MOUTH EVERY DAY,  Disp: 90 tablet, Rfl: 3   Multiple Minerals-Vitamins (CALCIUM CITRATE PLUS/MAGNESIUM) TABS, Take 1 tablet by mouth daily., Disp: , Rfl:    Multiple Vitamins-Minerals (MULTIVITAMIN WITH MINERALS) tablet, Take 1 tablet by mouth daily., Disp: , Rfl:    Polyethyl Glycol-Propyl Glycol (SYSTANE OP), Apply to eye daily., Disp: , Rfl:    leflunomide (ARAVA) 20 MG tablet, TAKE 1 TABLET BY MOUTH EVERY DAY (Patient not taking: Reported on 06/04/2021), Disp: 90 tablet, Rfl: 0   pravastatin (PRAVACHOL) 20 MG tablet, Take 1 tablet (20 mg total) by mouth every evening., Disp: 90 tablet, Rfl: 3  Allergies:  Allergies  Allergen Reactions   Tramadol Nausea Only   Codeine Phosphate Nausea Only    REACTION: unspecified    Past Medical History, Surgical history, Social history, and Family History were reviewed and updated.  Review of Systems: Review of Systems  Constitutional:  Positive for appetite change and fatigue.  HENT:  Negative.    Eyes: Negative.   Respiratory: Negative.    Cardiovascular: Negative.   Gastrointestinal: Negative.   Endocrine: Negative.   Genitourinary: Negative.    Musculoskeletal:  Positive for arthralgias and myalgias.  Skin: Negative.   Neurological: Negative.   Hematological: Negative.   Psychiatric/Behavioral: Negative.     Physical Exam:  weight is 124 lb (56.2 kg). Her  oral temperature is 99.3 F (37.4 C). Her blood pressure is 146/73 (abnormal) and her pulse is 94. Her respiration is 17 and oxygen saturation is 99%.   Wt Readings from Last 3 Encounters:  06/04/21 124 lb (56.2 kg)  04/18/21 125 lb 9.6 oz (57 kg)  01/07/21 127 lb 3.2 oz (57.7 kg)    Physical Exam Vitals reviewed.  HENT:     Head: Normocephalic and atraumatic.  Eyes:     Pupils: Pupils are equal, round, and reactive to light.  Cardiovascular:     Rate and Rhythm: Normal rate and regular rhythm.     Heart sounds: Normal heart sounds.  Pulmonary:     Effort: Pulmonary effort is normal.      Breath sounds: Normal breath sounds.  Abdominal:     General: Bowel sounds are normal.     Palpations: Abdomen is soft.  Musculoskeletal:        General: No tenderness or deformity. Normal range of motion.     Cervical back: Normal range of motion.  Lymphadenopathy:     Cervical: No cervical adenopathy.  Skin:    General: Skin is warm and dry.     Findings: No erythema or rash.  Neurological:     Mental Status: She is alert and oriented to person, place, and time.  Psychiatric:        Behavior: Behavior normal.        Thought Content: Thought content normal.        Judgment: Judgment normal.     Lab Results  Component Value Date   WBC 9.8 06/04/2021   HGB 11.0 (L) 06/04/2021   HCT 35.3 (L) 06/04/2021   MCV 79.9 (L) 06/04/2021   PLT 229 06/04/2021     Chemistry      Component Value Date/Time   NA 136 06/04/2021 1433   NA 141 02/03/2019 1129   K 4.2 06/04/2021 1433   CL 102 06/04/2021 1433   CO2 25 06/04/2021 1433   BUN 16 06/04/2021 1433   BUN 31 (H) 02/03/2019 1129   CREATININE 1.01 (H) 06/04/2021 1433   CREATININE 1.00 (H) 05/20/2021 1545      Component Value Date/Time   CALCIUM 10.0 06/04/2021 1433   ALKPHOS 71 06/04/2021 1433   AST 42 (H) 06/04/2021 1433   ALT 30 06/04/2021 1433   BILITOT 0.4 06/04/2021 1433      Impression and Plan: Ms. Uncapher is a very charming 86 year old white female.  She has profound neutropenia with a lot of lymphocytes.  I looked at her blood smear.  Quite a few lymphocytes are large and lymphocytes.  I just wonder if she has large granular lymphocytic leukemia.  This would certainly make sense given that she has the rheumatoid arthritis.  I think we will get have to get a bone marrow biopsy on her.  This way, we can be more aggressive with respect to how we can try to manage this.  Hopefully, she will be able to get back onto some medication for her rheumatoid arthritis.  I know this is her biggest concern.  If she does have  large granular lymphocytic leukemia, this might be difficult to correct.  We may have to consider given her G-CSF to try to help get her neutrophils up a little bit.  It is possible that this may be some type of autoimmune condition.  She comes in with her son.  We will have to make sure that radiology contacts her  son with respect to the bone marrow biopsy appointment.  I probably will get her back to see me in about 4 weeks or so.  By then, which she should have had her biopsy done and we can get back the cytogenetics and FISH studies.   Volanda Napoleon, MD 11/1/20225:41 PM

## 2021-06-05 ENCOUNTER — Telehealth: Payer: Self-pay | Admitting: Rheumatology

## 2021-06-05 LAB — FERRITIN: Ferritin: 225 ng/mL (ref 11–307)

## 2021-06-05 LAB — IRON AND TIBC
Iron: 34 ug/dL — ABNORMAL LOW (ref 41–142)
Saturation Ratios: 12 % — ABNORMAL LOW (ref 21–57)
TIBC: 283 ug/dL (ref 236–444)
UIBC: 249 ug/dL (ref 120–384)

## 2021-06-05 MED ORDER — LEFLUNOMIDE 20 MG PO TABS: 20.0000 mg | ORAL_TABLET | Freq: Every day | ORAL | 2 refills | Status: DC

## 2021-06-05 NOTE — Telephone Encounter (Signed)
I received a note from Dr. Marin Olp that [name] [name] will undergo a bone marrow aspiration to evaluate for lymphocytosis.  Dr. Marin Olp is suspecting that [name] [name] may have LGL leukemia.  According to Dr. Antonieta Pert note [name] [name] may resume leflunomide for rheumatoid arthritis. I called patient's home phone number.  I spoke with her son [name] [name].  He stated that his mother has been in a lot of discomfort since she has been off leflunomide.  I discussed Dr. Antonieta Pert recommendations with [name] [name].  I will call in a prescription for leflunomide 20 mg p.o. daily with 2 refills. Bo Merino, MD

## 2021-06-11 ENCOUNTER — Encounter: Payer: Self-pay | Admitting: Physician Assistant

## 2021-06-11 ENCOUNTER — Other Ambulatory Visit: Payer: Self-pay

## 2021-06-11 ENCOUNTER — Ambulatory Visit: Payer: Medicare HMO | Admitting: Physician Assistant

## 2021-06-11 VITALS — BP 123/69 | HR 94 | Resp 16 | Ht 61.0 in | Wt 123.0 lb

## 2021-06-11 DIAGNOSIS — Z8639 Personal history of other endocrine, nutritional and metabolic disease: Secondary | ICD-10-CM

## 2021-06-11 DIAGNOSIS — M19041 Primary osteoarthritis, right hand: Secondary | ICD-10-CM

## 2021-06-11 DIAGNOSIS — I10 Essential (primary) hypertension: Secondary | ICD-10-CM

## 2021-06-11 DIAGNOSIS — Z8679 Personal history of other diseases of the circulatory system: Secondary | ICD-10-CM | POA: Diagnosis not present

## 2021-06-11 DIAGNOSIS — M19042 Primary osteoarthritis, left hand: Secondary | ICD-10-CM

## 2021-06-11 DIAGNOSIS — M0579 Rheumatoid arthritis with rheumatoid factor of multiple sites without organ or systems involvement: Secondary | ICD-10-CM | POA: Diagnosis not present

## 2021-06-11 DIAGNOSIS — M8589 Other specified disorders of bone density and structure, multiple sites: Secondary | ICD-10-CM

## 2021-06-11 DIAGNOSIS — N1832 Chronic kidney disease, stage 3b: Secondary | ICD-10-CM | POA: Diagnosis not present

## 2021-06-11 DIAGNOSIS — M5030 Other cervical disc degeneration, unspecified cervical region: Secondary | ICD-10-CM

## 2021-06-11 DIAGNOSIS — Z79899 Other long term (current) drug therapy: Secondary | ICD-10-CM

## 2021-06-11 DIAGNOSIS — D72820 Lymphocytosis (symptomatic): Secondary | ICD-10-CM | POA: Diagnosis not present

## 2021-06-11 DIAGNOSIS — Z862 Personal history of diseases of the blood and blood-forming organs and certain disorders involving the immune mechanism: Secondary | ICD-10-CM

## 2021-06-11 DIAGNOSIS — D708 Other neutropenia: Secondary | ICD-10-CM

## 2021-06-11 DIAGNOSIS — E785 Hyperlipidemia, unspecified: Secondary | ICD-10-CM | POA: Diagnosis not present

## 2021-06-11 MED ORDER — PREDNISONE 5 MG PO TABS: ORAL_TABLET | ORAL | 0 refills | Status: DC

## 2021-06-13 ENCOUNTER — Other Ambulatory Visit: Payer: Self-pay

## 2021-06-13 ENCOUNTER — Inpatient Hospital Stay: Payer: Medicare HMO

## 2021-06-13 VITALS — BP 129/65 | HR 86 | Temp 97.9°F | Resp 17

## 2021-06-13 DIAGNOSIS — D709 Neutropenia, unspecified: Secondary | ICD-10-CM | POA: Diagnosis not present

## 2021-06-13 DIAGNOSIS — D509 Iron deficiency anemia, unspecified: Secondary | ICD-10-CM

## 2021-06-13 DIAGNOSIS — M069 Rheumatoid arthritis, unspecified: Secondary | ICD-10-CM | POA: Diagnosis not present

## 2021-06-13 MED ORDER — SODIUM CHLORIDE 0.9 % IV SOLN: Freq: Once | INTRAVENOUS | Status: AC

## 2021-06-13 MED ORDER — SODIUM CHLORIDE 0.9 % IV SOLN
125.0000 mg | Freq: Once | INTRAVENOUS | Status: AC
  Administered 2021-06-13: 125 mg via INTRAVENOUS
  Filled 2021-06-13: qty 125

## 2021-06-13 NOTE — Patient Instructions (Signed)
Sodium Ferric Gluconate Complex injection What is this medicine? SODIUM FERRIC GLUCONATE COMPLEX (SOE dee um FER ik GLOO koe nate KOM pleks) is an iron replacement. It is used with epoetin therapy to treat low iron levels in patients who are receiving hemodialysis. This medicine may be used for other purposes; ask your health care provider or pharmacist if you have questions. COMMON BRAND NAME(S): Ferrlecit, Nulecit What should I tell my health care provider before I take this medicine? They need to know if you have any of the following conditions:  anemia that is not from iron deficiency  high levels of iron in the body  an unusual or allergic reaction to iron, benzyl alcohol, other medicines, foods, dyes, or preservatives  pregnant or are trying to become pregnant  breast-feeding How should I use this medicine? This medicine is for infusion into a vein. It is given by a health care professional in a hospital or clinic setting. Talk to your pediatrician regarding the use of this medicine in children. While this drug may be prescribed for children as young as 6 years old for selected conditions, precautions do apply. Overdosage: If you think you have taken too much of this medicine contact a poison control center or emergency room at once. NOTE: This medicine is only for you. Do not share this medicine with others. What if I miss a dose? It is important not to miss your dose. Call your doctor or health care professional if you are unable to keep an appointment. What may interact with this medicine? Do not take this medicine with any of the following medications:  deferoxamine  dimercaprol  other iron products This medicine may also interact with the following medications:  chloramphenicol  deferasirox  medicine for blood pressure like enalapril This list may not describe all possible interactions. Give your health care provider a list of all the medicines, herbs,  non-prescription drugs, or dietary supplements you use. Also tell them if you smoke, drink alcohol, or use illegal drugs. Some items may interact with your medicine. What should I watch for while using this medicine? Your condition will be monitored carefully while you are receiving this medicine. Visit your doctor for check-ups as directed. What side effects may I notice from receiving this medicine? Side effects that you should report to your doctor or health care professional as soon as possible:  allergic reactions like skin rash, itching or hives, swelling of the face, lips, or tongue  breathing problems  changes in hearing  changes in vision  chills, flushing, or sweating  fast, irregular heartbeat  feeling faint or lightheaded, falls  fever, flu-like symptoms  high or low blood pressure  pain, tingling, numbness in the hands or feet  severe pain in the chest, back, flanks, or groin  swelling of the ankles, feet, hands  trouble passing urine or change in the amount of urine  unusually weak or tired Side effects that usually do not require medical attention (report to your doctor or health care professional if they continue or are bothersome):  cramps  dark colored stools  diarrhea  headache  nausea, vomiting  stomach upset This list may not describe all possible side effects. Call your doctor for medical advice about side effects. You may report side effects to FDA at 1-800-FDA-1088. Where should I keep my medicine? This drug is given in a hospital or clinic and will not be stored at home. NOTE: This sheet is a summary. It may not cover all   possible information. If you have questions about this medicine, talk to your doctor, pharmacist, or health care provider.  2021 Elsevier/Gold Standard (2008-03-22 15:58:57)   

## 2021-06-20 ENCOUNTER — Other Ambulatory Visit: Payer: Self-pay

## 2021-06-20 ENCOUNTER — Inpatient Hospital Stay: Payer: Medicare HMO

## 2021-06-20 VITALS — BP 137/55 | HR 87 | Temp 97.9°F | Resp 17

## 2021-06-20 DIAGNOSIS — M069 Rheumatoid arthritis, unspecified: Secondary | ICD-10-CM | POA: Diagnosis not present

## 2021-06-20 DIAGNOSIS — D709 Neutropenia, unspecified: Secondary | ICD-10-CM | POA: Diagnosis not present

## 2021-06-20 DIAGNOSIS — D509 Iron deficiency anemia, unspecified: Secondary | ICD-10-CM

## 2021-06-20 MED ORDER — SODIUM CHLORIDE 0.9 % IV SOLN: Freq: Once | INTRAVENOUS | Status: AC

## 2021-06-20 MED ORDER — SODIUM CHLORIDE 0.9 % IV SOLN
125.0000 mg | Freq: Once | INTRAVENOUS | Status: AC
  Administered 2021-06-20: 11:00:00 125 mg via INTRAVENOUS
  Filled 2021-06-20: qty 10

## 2021-06-20 NOTE — Patient Instructions (Addendum)
Sodium Ferric Gluconate Complex Injection ?What is this medication? ?SODIUM FERRIC GLUCONATE COMPLEX (SOE dee um FER ik GLOO koe nate KOM pleks) treats low levels of iron (iron deficiency anemia) in people with kidney disease. Iron is a mineral that plays an important role in making red blood cells, which carry oxygen from your lungs to the rest of your body. ?This medicine may be used for other purposes; ask your health care provider or pharmacist if you have questions. ?COMMON BRAND NAME(S): Ferrlecit, Nulecit ?What should I tell my care team before I take this medication? ?They need to know if you have any of the following conditions: ?Anemia that is not from iron deficiency ?High levels of iron in the blood ?An unusual or allergic reaction to iron, other medications, foods, dyes, or preservatives ?Pregnant or are trying to become pregnant ?Breast-feeding ?How should I use this medication? ?This medication is injected into a vein. It is given by your care team in a hospital or clinic setting. ?Talk to your care team about the use of this medication in children. While it may be prescribed for children as young as 6 years for selected conditions, precautions do apply. ?Overdosage: If you think you have taken too much of this medicine contact a poison control center or emergency room at once. ?NOTE: This medicine is only for you. Do not share this medicine with others. ?What if I miss a dose? ?It is important not to miss your dose. Call your care team if you are unable to keep an appointment. ?What may interact with this medication? ?Do not take this medication with any of the following: ?Deferasirox ?Deferoxamine ?Dimercaprol ?This medication may also interact with the following: ?Other iron products ?This list may not describe all possible interactions. Give your health care provider a list of all the medicines, herbs, non-prescription drugs, or dietary supplements you use. Also tell them if you smoke, drink  alcohol, or use illegal drugs. Some items may interact with your medicine. ?What should I watch for while using this medication? ?Your condition will be monitored carefully while you are receiving this medication. ?Visit your care team for regular checks on your progress. You may need blood work while you are taking this medication. ?What side effects may I notice from receiving this medication? ?Side effects that you should report to your care team as soon as possible: ?Allergic reactions--skin rash, itching, hives, swelling of the face, lips, tongue, or throat ?Low blood pressure--dizziness, feeling faint or lightheaded, blurry vision ?Shortness of breath ?Side effects that usually do not require medical attention (report to your care team if they continue or are bothersome): ?Flushing ?Headache ?Joint pain ?Muscle pain ?Nausea ?Pain, redness, or irritation at injection site ?This list may not describe all possible side effects. Call your doctor for medical advice about side effects. You may report side effects to FDA at 1-800-FDA-1088. ?Where should I keep my medication? ?This medication is given in a hospital or clinic and will not be stored at home. ?NOTE: This sheet is a summary. It may not cover all possible information. If you have questions about this medicine, talk to your doctor, pharmacist, or health care provider. ?? 2022 Elsevier/Gold Standard (2020-12-14 00:00:00) ? ?

## 2021-07-05 ENCOUNTER — Other Ambulatory Visit: Payer: Self-pay | Admitting: Internal Medicine

## 2021-07-05 NOTE — H&P (Signed)
Chief Complaint: Patient was seen in consultation today for bone marrow biopsy with aspiration  Referring Physician(s): Ennever,Peter R  Supervising Physician: Markus Daft  Patient Status: [facility] - Out-pt  History of Present Illness: [name] [name] is an 86 y.o. female with a medical history significant for HTN, rheumatoid arthritis and neutropenia first identified January 2022. She is followed by Hematology/Oncology.  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     Interventional Radiology has been asked to evaluate this patient for an image-guided bone marrow biopsy with aspiration for further work up.   Past Medical History:  Diagnosis Date   HYPERLIPIDEMIA 01/19/2007   HYPERTENSION 01/19/2007   HYPOTHYROIDISM 01/19/2007   Rheumatoid arthritis(714.0) 01/19/2007    Past Surgical History:  Procedure Laterality Date   ABDOMINAL HYSTERECTOMY     APPENDECTOMY     CHOLECYSTECTOMY     NECK SURGERY      Allergies: Tramadol and Codeine phosphate  Medications: Prior to Admission medications   Medication Sig Start Date End Date Taking? Authorizing Provider  acetaminophen (TYLENOL) 650 MG CR tablet Take 650 mg by mouth every 8 (eight) hours as needed for pain.    [provider]  benazepril (LOTENSIN) 10 MG tablet Take 1 tablet (10 mg total) by mouth daily. 05/20/21   Libby Maw, MD  Echinacea-Goldenseal (IMMUNE HEALTH BLEND PO) Take 500 mg by mouth daily. Patient not taking: Reported on 06/11/2021    [provider]  leflunomide (ARAVA) 20 MG tablet Take 1 tablet (20 mg total) by mouth daily. 06/05/21   Bo Merino, MD  levothyroxine (SYNTHROID) 75 MCG tablet TAKE 1 TABLET BY MOUTH EVERY DAY 01/25/21   Libby Maw, MD  Multiple Minerals-Vitamins (CALCIUM CITRATE PLUS/MAGNESIUM) TABS Take 1 tablet by mouth daily.    [provider]  Multiple  Vitamins-Minerals (MULTIVITAMIN WITH MINERALS) tablet Take 1 tablet by mouth daily.    [provider]  Polyethyl Glycol-Propyl Glycol (SYSTANE OP) Apply to eye daily.    [provider]  pravastatin (PRAVACHOL) 20 MG tablet Take 1 tablet (20 mg total) by mouth every evening. 11/02/20 04/18/21  Richardo Priest, MD  predniSONE (DELTASONE) 5 MG tablet Take 2 tablets by mouth daily x1 wk, 1.5 tablets daily x1 wk, 1 tablet daily x1wk, half tablet daily x1wk. 06/11/21   Ofilia Neas, PA-C     Family History  Problem Relation Age of Onset   Diabetes Father    Heart disease Father    Cancer Sister        breast    Heart disease Sister    Heart attack Brother    Heart attack Brother    Heart attack Brother     Social History   Socioeconomic History   Marital status: Married    Spouse name: Not on file   Number of children: Not on file   Years of education: Not on file   Highest education level: Not on file  Occupational History   Not on file  Tobacco Use   Smoking status: Never   Smokeless tobacco: Never  Vaping Use   Vaping Use: Never used  Substance and Sexual Activity   Alcohol use: No   Drug use: No   Sexual activity: Not Currently  Other Topics Concern   Not on file  Social History Narrative   Not on file   Social Determinants of Health   Financial Resource Strain:  Not on file  Food Insecurity: Not on file  Transportation Needs: Not on file  Physical Activity: Not on file  Stress: Not on file  Social Connections: Not on file    Review of Systems: A 12 point ROS discussed and pertinent positives are indicated in the HPI above.  All other systems are negative.  Review of Systems  Constitutional:  Negative for appetite change and fatigue.  Respiratory:  Negative for cough and shortness of breath.   Cardiovascular:  Negative for chest pain and leg swelling.  Gastrointestinal:  Negative for abdominal pain, diarrhea, nausea and vomiting.   Neurological:  Negative for dizziness and headaches.   Vital Signs: BP (!) 162/76   Pulse (!) 108   Temp 99.5 F (37.5 C) (Oral)   Resp 16   Ht 5' 1.5" (1.562 m)   Wt 117 lb (53.1 kg)   SpO2 93%   BMI 21.75 kg/m   Physical Exam Constitutional:      General: She is not in acute distress.    Appearance: She is not ill-appearing.  HENT:     Mouth/Throat:     Mouth: Mucous membranes are moist.     Pharynx: Oropharynx is clear.  Cardiovascular:     Rate and Rhythm: Regular rhythm. Tachycardia present.  Pulmonary:     Effort: Pulmonary effort is normal.     Breath sounds: Normal breath sounds.  Abdominal:     General: Bowel sounds are normal.     Palpations: Abdomen is soft.     Tenderness: There is no abdominal tenderness.  Musculoskeletal:     Right lower leg: No edema.     Left lower leg: No edema.  Skin:    General: Skin is warm and dry.  Neurological:     Mental Status: She is alert and oriented to person, place, and time.    Imaging: No results found.  Labs:  CBC: Recent Labs    05/20/21 1545 05/27/21 1340 06/04/21 1433 07/08/21 0759  WBC 12.4* 11.5* 9.8 7.1  HGB 11.6* 11.0* 11.0* 11.6*  HCT 36.5 34.9* 35.3* 38.1  PLT 222 219 229 140*    COAGS: No results for input(s): INR, APTT in the last 8760 hours.  BMP: Recent Labs    08/09/20 0000 10/15/20 1056 11/19/20 1451 04/18/21 1121 05/20/21 1545 06/04/21 1433  NA 141   < > 140 143 139 136  K 5.3   < > 4.4 5.0 4.6 4.2  CL 106   < > 101 105 104 102  CO2 27   < > '30 28 28 25  ' GLUCOSE 95   < > 182* 95 87 127*  BUN 22   < > '21 23 16 16  ' CALCIUM 10.2   < > 10.2 10.0 9.7 10.0  CREATININE 1.17*   < > 1.08* 1.24* 1.00* 1.01*  GFRNONAA 43*  --  51*  --   --  55*  GFRAA 50*  --   --   --   --   --    < > = values in this interval not displayed.    LIVER FUNCTION TESTS: Recent Labs    08/09/20 0000 11/19/20 1451 05/20/21 1545 06/04/21 1433  BILITOT 0.4 0.5 0.4 0.4  AST '23 20 21 ' 42*  ALT '11  11 11 30  ' ALKPHOS  --  78  --  71  PROT 7.2 7.7 7.6 7.8  ALBUMIN  --  4.1  --  4.0    TUMOR  MARKERS: No results for input(s): AFPTM, CEA, CA199, CHROMGRNA in the last 8760 hours.  Assessment and Plan:  Neutropenia; suspected lymphocytic leukemia: [name] [name], 86 year old female, presents today to the [place] [facility] department for an image-guided bone marrow biopsy with aspiration.  Risks and benefits of bone marrow biopsy with aspiration were discussed with the patient and/or patient's family including, but not limited to bleeding, infection, damage to adjacent structures or low yield requiring additional tests.  All of the questions were answered and there is agreement to proceed. She has been NPO.   Consent signed and in chart.  Thank you for this interesting consult.  I greatly enjoyed meeting [facility] and look forward to participating in their care.  A copy of this report was sent to the requesting provider on this date.  Electronically Signed: Soyla Dryer, AGACNP-BC [phone] 07/08/2021, 8:30 AM   I spent a total of  30 Minutes   in face to face in clinical consultation, greater than 50% of which was counseling/coordinating care for bone marrow biopsy with aspiration.

## 2021-07-08 ENCOUNTER — Other Ambulatory Visit: Payer: Self-pay

## 2021-07-08 ENCOUNTER — Ambulatory Visit (HOSPITAL_COMMUNITY)
Admission: RE | Admit: 2021-07-08 | Discharge: 2021-07-08 | Disposition: A | Discharge status: Home / Self Care | Payer: Medicare HMO | Source: Ambulatory Visit | Attending: Hematology & Oncology | Admitting: Hematology & Oncology

## 2021-07-08 ENCOUNTER — Encounter (HOSPITAL_COMMUNITY): Payer: Self-pay

## 2021-07-08 DIAGNOSIS — M069 Rheumatoid arthritis, unspecified: Secondary | ICD-10-CM | POA: Insufficient documentation

## 2021-07-08 DIAGNOSIS — D649 Anemia, unspecified: Secondary | ICD-10-CM | POA: Diagnosis not present

## 2021-07-08 DIAGNOSIS — D72829 Elevated white blood cell count, unspecified: Secondary | ICD-10-CM | POA: Diagnosis not present

## 2021-07-08 DIAGNOSIS — C91Z0 Other lymphoid leukemia not having achieved remission: Secondary | ICD-10-CM | POA: Insufficient documentation

## 2021-07-08 DIAGNOSIS — D696 Thrombocytopenia, unspecified: Secondary | ICD-10-CM | POA: Diagnosis not present

## 2021-07-08 DIAGNOSIS — D709 Neutropenia, unspecified: Secondary | ICD-10-CM | POA: Diagnosis not present

## 2021-07-08 DIAGNOSIS — D708 Other neutropenia: Secondary | ICD-10-CM

## 2021-07-08 DIAGNOSIS — I10 Essential (primary) hypertension: Secondary | ICD-10-CM | POA: Insufficient documentation

## 2021-07-08 DIAGNOSIS — D72820 Lymphocytosis (symptomatic): Secondary | ICD-10-CM | POA: Diagnosis not present

## 2021-07-08 DIAGNOSIS — D72822 Plasmacytosis: Secondary | ICD-10-CM | POA: Diagnosis not present

## 2021-07-08 DIAGNOSIS — D729 Disorder of white blood cells, unspecified: Secondary | ICD-10-CM | POA: Diagnosis not present

## 2021-07-08 LAB — CBC WITH DIFFERENTIAL/PLATELET
Abs Immature Granulocytes: 0 10*3/uL (ref 0.00–0.07)
Basophils Absolute: 0 10*3/uL (ref 0.0–0.1)
Basophils Relative: 0 %
Eosinophils Absolute: 0.1 10*3/uL (ref 0.0–0.5)
Eosinophils Relative: 1 %
HCT: 38.1 % (ref 36.0–46.0)
Hemoglobin: 11.6 g/dL — ABNORMAL LOW (ref 12.0–15.0)
Immature Granulocytes: 0 %
Lymphocytes Relative: 82 %
Lymphs Abs: 5.8 10*3/uL — ABNORMAL HIGH (ref 0.7–4.0)
MCH: 25.1 pg — ABNORMAL LOW (ref 26.0–34.0)
MCHC: 30.4 g/dL (ref 30.0–36.0)
MCV: 82.5 fL (ref 80.0–100.0)
Monocytes Absolute: 0.6 10*3/uL (ref 0.1–1.0)
Monocytes Relative: 9 %
Neutro Abs: 0.6 10*3/uL — ABNORMAL LOW (ref 1.7–7.7)
Neutrophils Relative %: 8 %
Platelets: 140 10*3/uL — ABNORMAL LOW (ref 150–400)
RBC: 4.62 MIL/uL (ref 3.87–5.11)
RDW: 15.7 % — ABNORMAL HIGH (ref 11.5–15.5)
WBC: 7.1 10*3/uL (ref 4.0–10.5)
nRBC: 0 % (ref 0.0–0.2)

## 2021-07-08 MED ORDER — FENTANYL CITRATE (PF) 100 MCG/2ML IJ SOLN
INTRAMUSCULAR | Status: AC
  Filled 2021-07-08: qty 4

## 2021-07-08 MED ORDER — MIDAZOLAM HCL 2 MG/2ML IJ SOLN
INTRAMUSCULAR | Status: AC
  Filled 2021-07-08: qty 4

## 2021-07-08 MED ORDER — SODIUM CHLORIDE 0.9 % IV SOLN: INTRAVENOUS | Status: DC

## 2021-07-08 MED ORDER — FENTANYL CITRATE (PF) 100 MCG/2ML IJ SOLN
INTRAMUSCULAR | Status: AC | PRN
  Administered 2021-07-08 (×2): 50 ug via INTRAVENOUS

## 2021-07-08 MED ORDER — FLUMAZENIL 0.5 MG/5ML IV SOLN
INTRAVENOUS | Status: AC
  Filled 2021-07-08: qty 5

## 2021-07-08 MED ORDER — NALOXONE HCL 0.4 MG/ML IJ SOLN
INTRAMUSCULAR | Status: AC
  Filled 2021-07-08: qty 1

## 2021-07-08 MED ORDER — MIDAZOLAM HCL 2 MG/2ML IJ SOLN
INTRAMUSCULAR | Status: AC | PRN
  Administered 2021-07-08 (×2): 1 mg via INTRAVENOUS

## 2021-07-08 NOTE — Sedation Documentation (Signed)
Bandaid placed at Biopsy site.  Clean dry and intact

## 2021-07-08 NOTE — Procedures (Signed)
Interventional Radiology Procedure:   Indications: Profound neutropenia. Marked lymphocytosis. Questionable LGL leukemia.  Procedure: CT guided bone marrow biopsy  Findings: 2 aspirates and 1 core from right ilium  Complications: None     EBL: Minimal, less than 10 ml  Plan: Discharge to home in one hour.   Tayte Mcwherter R. Anselm Pancoast, MD  Pager: [phone]

## 2021-07-08 NOTE — Discharge Instructions (Addendum)
Bone Marrow Aspiration and Bone Marrow Biopsy, Adult, Care After This sheet gives you information about how to care for yourself after your procedure. Your health care provider may also give you more specific instructions. If you have problems or questions, contact your health care provider. What can I expect after the procedure? After the procedure, it is common to have: Mild pain and tenderness. Swelling. Bruising. Follow these instructions at home: Puncture site care Follow instructions from your health care provider about how to take care of the puncture site. Make sure you: Wash your hands with soap and water before and after you change your bandage (dressing). If soap and water are not available, use hand sanitizer. Change your dressing as told by your health care provider. Check your puncture site every day for signs of infection. Check for: More redness, swelling, or pain. Fluid or blood. Warmth. Pus or a bad smell. Activity Return to your normal activities as told by your health care provider. Ask your health care provider what activities are safe for you. Do not lift anything that is heavier than 10 lb (4.5 kg), or the limit that you are told, until your health care provider says that it is safe. Do not drive for 24 hours if you were given a sedative during your procedure. General instructions Take over-the-counter and prescription medicines only as told by your health care provider. Do not take baths, swim, or use a hot tub until your health care provider approves. Ask your health care provider if you may take showers. You may only be allowed to take sponge baths. If directed, put ice on the affected area. To do this: Put ice in a plastic bag. Place a towel between your skin and the bag. Leave the ice on for 20 minutes, 2-3 times a day. Keep all follow-up visits as told by your health care provider. This is important. Contact a health care provider if: Your pain is not  controlled with medicine. You have a fever. You have more redness, swelling, or pain around the puncture site. You have fluid or blood coming from the puncture site. Your puncture site feels warm to the touch. You have pus or a bad smell coming from the puncture site. Summary After the procedure, it is common to have mild pain, tenderness, swelling, and bruising. Follow instructions from your health care provider about how to take care of the puncture site and what activities are safe for you. Take over-the-counter and prescription medicines only as told by your health care provider. Contact a health care provider if you have any signs of infection, such as fluid or blood coming from the puncture site. This information is not intended to replace advice given to you by your health care provider. Make sure you discuss any questions you have with your health care provider. Document Revised: 12/07/2018 Document Reviewed: 12/07/2018 Elsevier Patient Education  [place].   Moderate Conscious Sedation, Adult, Care After This sheet gives you information about how to care for yourself after your procedure. Your health care provider may also give you more specific instructions. If you have problems or questions, contact your health care provider. What can I expect after the procedure? After the procedure, it is common to have: Sleepiness for several hours. Impaired judgment for several hours. Difficulty with balance. Vomiting if you eat too soon. Follow these instructions at home: For the time period you were told by your health care provider:    Rest. Do not participate in  activities where you could fall or become injured. Do not drive or use machinery. Do not drink alcohol. Do not take sleeping pills or medicines that cause drowsiness. Do not make important decisions or sign legal documents. Do not take care of children on your own. Eating and drinking Follow the diet recommended  by your health care provider. Drink enough fluid to keep your urine pale yellow. If you vomit: Drink water, juice, or soup when you can drink without vomiting. Make sure you have little or no nausea before eating solid foods. General instructions Take over-the-counter and prescription medicines only as told by your health care provider. Have a responsible adult stay with you for the time you are told. It is important to have someone help care for you until you are awake and alert. Do not smoke. Keep all follow-up visits as told by your health care provider. This is important. Contact a health care provider if: You are still sleepy or having trouble with balance after 24 hours. You feel light-headed. You keep feeling nauseous or you keep vomiting. You develop a rash. You have a fever. You have redness or swelling around the IV site. Get help right away if: You have trouble breathing. You have new-onset confusion at home. Summary After the procedure, it is common to feel sleepy, have impaired judgment, or feel nauseous if you eat too soon. Rest after you get home. Know the things you should not do after the procedure. Follow the diet recommended by your health care provider and drink enough fluid to keep your urine pale yellow. Get help right away if you have trouble breathing or new-onset confusion at home. This information is not intended to replace advice given to you by your health care provider. Make sure you discuss any questions you have with your health care provider. Document Revised: 11/18/2019 Document Reviewed: 06/16/2019 Elsevier Patient Education  2022 [employer].

## 2021-07-10 LAB — SURGICAL PATHOLOGY

## 2021-07-15 ENCOUNTER — Inpatient Hospital Stay: Payer: Medicare HMO | Attending: Hematology & Oncology

## 2021-07-15 ENCOUNTER — Inpatient Hospital Stay: Payer: Medicare HMO | Admitting: Hematology & Oncology

## 2021-07-15 ENCOUNTER — Other Ambulatory Visit: Payer: Self-pay

## 2021-07-15 ENCOUNTER — Encounter: Payer: Self-pay | Admitting: Hematology & Oncology

## 2021-07-15 VITALS — BP 130/75 | HR 104 | Temp 99.7°F | Resp 17 | Wt 121.0 lb

## 2021-07-15 DIAGNOSIS — D709 Neutropenia, unspecified: Secondary | ICD-10-CM | POA: Insufficient documentation

## 2021-07-15 DIAGNOSIS — C91Z0 Other lymphoid leukemia not having achieved remission: Secondary | ICD-10-CM | POA: Diagnosis not present

## 2021-07-15 DIAGNOSIS — D708 Other neutropenia: Secondary | ICD-10-CM

## 2021-07-15 LAB — CBC WITH DIFFERENTIAL (CANCER CENTER ONLY)
Abs Immature Granulocytes: 0.01 10*3/uL (ref 0.00–0.07)
Basophils Absolute: 0.1 10*3/uL (ref 0.0–0.1)
Basophils Relative: 1 %
Eosinophils Absolute: 0.2 10*3/uL (ref 0.0–0.5)
Eosinophils Relative: 2 %
HCT: 39.5 % (ref 36.0–46.0)
Hemoglobin: 12.1 g/dL (ref 12.0–15.0)
Immature Granulocytes: 0 %
Lymphocytes Relative: 77 %
Lymphs Abs: 5.3 10*3/uL — ABNORMAL HIGH (ref 0.7–4.0)
MCH: 25.2 pg — ABNORMAL LOW (ref 26.0–34.0)
MCHC: 30.6 g/dL (ref 30.0–36.0)
MCV: 82.1 fL (ref 80.0–100.0)
Monocytes Absolute: 0.7 10*3/uL (ref 0.1–1.0)
Monocytes Relative: 10 %
Neutro Abs: 0.7 10*3/uL — ABNORMAL LOW (ref 1.7–7.7)
Neutrophils Relative %: 10 %
Platelet Count: 209 10*3/uL (ref 150–400)
RBC: 4.81 MIL/uL (ref 3.87–5.11)
RDW: 15.2 % (ref 11.5–15.5)
WBC Count: 6.9 10*3/uL (ref 4.0–10.5)
nRBC: 0 % (ref 0.0–0.2)

## 2021-07-15 LAB — CMP (CANCER CENTER ONLY)
ALT: 10 U/L (ref 0–44)
AST: 18 U/L (ref 15–41)
Albumin: 3.9 g/dL (ref 3.5–5.0)
Alkaline Phosphatase: 83 U/L (ref 38–126)
Anion gap: 9 (ref 5–15)
BUN: 13 mg/dL (ref 8–23)
CO2: 28 mmol/L (ref 22–32)
Calcium: 10.2 mg/dL (ref 8.9–10.3)
Chloride: 103 mmol/L (ref 98–111)
Creatinine: 1.09 mg/dL — ABNORMAL HIGH (ref 0.44–1.00)
GFR, Estimated: 50 mL/min — ABNORMAL LOW (ref >60)
Glucose, Bld: 108 mg/dL — ABNORMAL HIGH (ref 70–99)
Potassium: 4.6 mmol/L (ref 3.5–5.1)
Sodium: 140 mmol/L (ref 135–145)
Total Bilirubin: 0.5 mg/dL (ref 0.3–1.2)
Total Protein: 7.3 g/dL (ref 6.5–8.1)

## 2021-07-15 LAB — SAVE SMEAR(SSMR), FOR PROVIDER SLIDE REVIEW

## 2021-07-15 LAB — LACTATE DEHYDROGENASE: LDH: 205 U/L — ABNORMAL HIGH (ref 98–192)

## 2021-07-15 LAB — SEDIMENTATION RATE: Sed Rate: 57 mm/hr — ABNORMAL HIGH (ref 0–22)

## 2021-07-15 MED ORDER — METHOTREXATE SODIUM 5 MG PO TABS: 15.0000 mg | ORAL_TABLET | ORAL | 4 refills | Status: DC

## 2021-07-15 NOTE — Progress Notes (Signed)
Hematology and Oncology Follow Up Visit  [name] [name] [id number] 03-02-1935 86 y.o. 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 weekly- on 07/19/2021     Interim History:  [name] [name] is back for follow-up.  I think we have a diagnosis with her.  We did do a.  This was done on 07/09/2019.  The bone marrow report ([id number]) shows a hypercellular marrow.  She has leukocytosis.  It is favored that this is a large granular lymphocytic leukemia.  I totally agree with this.  On a peripheral blood smear, she has large leukocytes with granules.  Given that she has rheumatoid arthritis, there was certainly make clinical sense that she has large granular lymphocytic leukemia.  She has marked neutropenia.  As such, I think going to have to treat this.  I think the recommended treatment would be methotrexate.  This may help with her rheumatism.  She has had no fever.  She has had no obvious infections.  She has had no swollen lymph nodes.  There has been no change in bowel or bladder habits.  There has been no bleeding.  She did have a nice Thanksgiving.  Overall, her performance status is ECOG 2.  Medications:  Current Outpatient Medications:    acetaminophen (TYLENOL) 650 MG CR tablet, Take 650 mg by mouth every 8 (eight) hours as needed for pain., Disp: , Rfl:    benazepril (LOTENSIN) 10 MG tablet, Take 1 tablet (10 mg total) by mouth daily., Disp: 90 tablet, Rfl: 1   leflunomide (ARAVA) 20 MG tablet, Take 1 tablet (20 mg total) by mouth daily., Disp: 30 tablet, Rfl: 2   levothyroxine (SYNTHROID) 75 MCG tablet, TAKE 1 TABLET BY MOUTH EVERY DAY, Disp: 90 tablet, Rfl: 3   methotrexate (RHEUMATREX) 5 MG tablet, Take 3 tablets (15 mg total) by mouth once a week. Caution: Chemotherapy. Protect from light., Disp: 12 tablet, Rfl: 4   Multiple Minerals-Vitamins (CALCIUM CITRATE PLUS/MAGNESIUM) TABS,  Take 1 tablet by mouth daily., Disp: , Rfl:    Multiple Vitamins-Minerals (MULTIVITAMIN WITH MINERALS) tablet, Take 1 tablet by mouth daily., Disp: , Rfl:    Polyethyl Glycol-Propyl Glycol (SYSTANE OP), Apply to eye daily., Disp: , Rfl:    Echinacea-Goldenseal (IMMUNE HEALTH BLEND PO), Take 500 mg by mouth daily. (Patient not taking: Reported on 06/11/2021), Disp: , Rfl:    pravastatin (PRAVACHOL) 20 MG tablet, Take 1 tablet (20 mg total) by mouth every evening., Disp: 90 tablet, Rfl: 3   predniSONE (DELTASONE) 5 MG tablet, Take 2 tablets by mouth daily x1 wk, 1.5 tablets daily x1 wk, 1 tablet daily x1wk, half tablet daily x1wk. (Patient not taking: Reported on 07/15/2021), Disp: 35 tablet, Rfl: 0  Allergies:  Allergies  Allergen Reactions   Tramadol Nausea Only   Codeine Phosphate Nausea Only    REACTION: unspecified    Past Medical History, Surgical history, Social history, and Family History were reviewed and updated.  Review of Systems: Review of Systems  Constitutional:  Positive for fatigue.  HENT:  Negative.    Eyes: Negative.   Respiratory: Negative.    Cardiovascular: Negative.   Gastrointestinal: Negative.   Endocrine: Negative.   Genitourinary: Negative.    Musculoskeletal:  Positive for arthralgias and myalgias.  Skin: Negative.   Neurological: Negative.   Hematological: Negative.   Psychiatric/Behavioral: Negative.     Physical Exam:  weight is 121 lb (54.9 kg). Her oral  temperature is 99.7 F (37.6 C). Her blood pressure is 130/75 and her pulse is 104 (abnormal). Her respiration is 17 and oxygen saturation is 99%.   Wt Readings from Last 3 Encounters:  07/15/21 121 lb (54.9 kg)  07/08/21 117 lb (53.1 kg)  06/11/21 123 lb (55.8 kg)    Physical Exam Vitals reviewed.  HENT:     Head: Normocephalic and atraumatic.  Eyes:     Pupils: Pupils are equal, round, and reactive to light.  Cardiovascular:     Rate and Rhythm: Normal rate and regular rhythm.      Heart sounds: Normal heart sounds.  Pulmonary:     Effort: Pulmonary effort is normal.     Breath sounds: Normal breath sounds.  Abdominal:     General: Bowel sounds are normal.     Palpations: Abdomen is soft.  Musculoskeletal:        General: No tenderness or deformity. Normal range of motion.     Cervical back: Normal range of motion.  Lymphadenopathy:     Cervical: No cervical adenopathy.  Skin:    General: Skin is warm and dry.     Findings: No erythema or rash.  Neurological:     Mental Status: She is alert and oriented to person, place, and time.  Psychiatric:        Behavior: Behavior normal.        Thought Content: Thought content normal.        Judgment: Judgment normal.     Lab Results  Component Value Date   WBC 6.9 07/15/2021   HGB 12.1 07/15/2021   HCT 39.5 07/15/2021   MCV 82.1 07/15/2021   PLT 209 07/15/2021     Chemistry      Component Value Date/Time   NA 140 07/15/2021 0926   NA 141 02/03/2019 1129   K 4.6 07/15/2021 0926   CL 103 07/15/2021 0926   CO2 28 07/15/2021 0926   BUN 13 07/15/2021 0926   BUN 31 (H) 02/03/2019 1129   CREATININE 1.09 (H) 07/15/2021 0926   CREATININE 1.00 (H) 05/20/2021 1545      Component Value Date/Time   CALCIUM 10.2 07/15/2021 0926   ALKPHOS 83 07/15/2021 0926   AST 18 07/15/2021 0926   ALT 10 07/15/2021 0926   BILITOT 0.5 07/15/2021 0926      Impression and Plan: [name] [name] is a very charming 86 year old white female.  She has rheumatoid arthritis.  She has marked lymphocytosis with neutropenia.  As such, I believe that the bone marrow is consistent with large granular lymphocytic leukemia.  I do think that methotrexate would be reasonable to treat her with.  I probably would get her started on 15 mg a week.  Hopefully, we will start to see an improvement in her neutrophil count.  Thankfully, we do have some other options that we can consider if we find that the methotrexate does not work.  Talk to her about  methotrexate.  This is a low dose so should not cause her any problems.  However, if there are issues, she can call us.  I would like to see her back in about a month at which point we should hopefully see a change in her blood counts for the better.   Volanda Napoleon, MD 12/12/20225:06 PM

## 2021-07-16 ENCOUNTER — Encounter (HOSPITAL_COMMUNITY): Payer: Self-pay | Admitting: Hematology & Oncology

## 2021-07-16 LAB — IGG, IGA, IGM
IgA: 424 mg/dL — ABNORMAL HIGH (ref 64–422)
IgG (Immunoglobin G), Serum: 1266 mg/dL (ref 586–1602)
IgM (Immunoglobulin M), Srm: 194 mg/dL (ref 26–217)

## 2021-07-18 ENCOUNTER — Encounter (HOSPITAL_COMMUNITY): Payer: Self-pay | Admitting: Hematology & Oncology

## 2021-07-30 ENCOUNTER — Ambulatory Visit: Payer: Medicare HMO

## 2021-07-31 ENCOUNTER — Ambulatory Visit (INDEPENDENT_AMBULATORY_CARE_PROVIDER_SITE_OTHER): Payer: Medicare HMO

## 2021-07-31 DIAGNOSIS — Z0000 Encounter for general adult medical examination without abnormal findings: Secondary | ICD-10-CM | POA: Diagnosis not present

## 2021-07-31 NOTE — Progress Notes (Signed)
Subjective:   [name] [name] is a 86 y.o. female who presents for Medicare Annual (Subsequent) preventive examination.  I connected with [facility] today by telephone and verified that I am speaking with the correct person using two identifiers. Location patient: home Location provider: work Persons participating in the virtual visit: patient, provider.   I discussed the limitations, risks, security and privacy concerns of performing an evaluation and management service by telephone and the availability of in person appointments. I also discussed with the patient that there may be a patient responsible charge related to this service. The patient expressed understanding and verbally consented to this telephonic visit.    Interactive audio and video telecommunications were attempted between this provider and patient, however failed, due to patient having technical difficulties OR patient did not have access to video capability.  We continued and completed visit with audio only.    Review of Systems     Cardiac Risk Factors include: advanced age (>12men, >42 women);dyslipidemia     Objective:    Today's Vitals   There is no height or weight on file to calculate BMI.  Advanced Directives 07/31/2021 07/15/2021 07/08/2021 06/04/2021 11/19/2020 10/05/2019 01/31/2013  Does Patient Have a Medical Advance Directive? Yes No No No No Yes Patient has advance directive, copy not in chart  Type of Advance Directive Living will;Healthcare Power of [place];Living will Living will  Does patient want to make changes to medical advance directive? - - - - - Yes (MAU/Ambulatory/Procedural Areas - Information given) -  Copy of [place] in Chart? No - copy requested - - - - Yes - validated most recent copy scanned in chart (See row information) Copy requested from family  Would patient like information on creating a medical advance directive? - No -  Patient declined No - Patient declined No - Patient declined No - Patient declined - -  Pre-existing out of facility DNR order (yellow form or pink MOST form) - - - - - - No    Current Medications (verified) Outpatient Encounter Medications as of 07/31/2021  Medication Sig   acetaminophen (TYLENOL) 650 MG CR tablet Take 650 mg by mouth every 8 (eight) hours as needed for pain.   benazepril (LOTENSIN) 10 MG tablet Take 1 tablet (10 mg total) by mouth daily.   leflunomide (ARAVA) 20 MG tablet Take 1 tablet (20 mg total) by mouth daily.   levothyroxine (SYNTHROID) 75 MCG tablet TAKE 1 TABLET BY MOUTH EVERY DAY   Multiple Minerals-Vitamins (CALCIUM CITRATE PLUS/MAGNESIUM) TABS Take 1 tablet by mouth daily.   Multiple Vitamins-Minerals (MULTIVITAMIN WITH MINERALS) tablet Take 1 tablet by mouth daily.   Polyethyl Glycol-Propyl Glycol (SYSTANE OP) Apply to eye daily.   Echinacea-Goldenseal (IMMUNE HEALTH BLEND PO) Take 500 mg by mouth daily. (Patient not taking: Reported on 06/11/2021)   methotrexate (RHEUMATREX) 5 MG tablet Take 3 tablets (15 mg total) by mouth once a week. Caution: Chemotherapy. Protect from light. (Patient not taking: Reported on 07/31/2021)   pravastatin (PRAVACHOL) 20 MG tablet Take 1 tablet (20 mg total) by mouth every evening.   predniSONE (DELTASONE) 5 MG tablet Take 2 tablets by mouth daily x1 wk, 1.5 tablets daily x1 wk, 1 tablet daily x1wk, half tablet daily x1wk. (Patient not taking: Reported on 07/15/2021)   No facility-administered encounter medications on file as of 07/31/2021.    Allergies (verified) Tramadol and Codeine phosphate   History: Past Medical  History:  Diagnosis Date   HYPERLIPIDEMIA 01/19/2007   HYPERTENSION 01/19/2007   HYPOTHYROIDISM 01/19/2007   Rheumatoid arthritis(714.0) 01/19/2007   Past Surgical History:  Procedure Laterality Date   ABDOMINAL HYSTERECTOMY     APPENDECTOMY     CHOLECYSTECTOMY     NECK SURGERY     Family History  Problem  Relation Age of Onset   Diabetes Father    Heart disease Father    Cancer Sister        breast    Heart disease Sister    Heart attack Brother    Heart attack Brother    Heart attack Brother    Social History   Socioeconomic History   Marital status: Married    Spouse name: Not on file   Number of children: Not on file   Years of education: Not on file   Highest education level: Not on file  Occupational History   Not on file  Tobacco Use   Smoking status: Never   Smokeless tobacco: Never  Vaping Use   Vaping Use: Never used  Substance and Sexual Activity   Alcohol use: No   Drug use: No   Sexual activity: Not Currently  Other Topics Concern   Not on file  Social History Narrative   Not on file   Social Determinants of Health   Financial Resource Strain: Low Risk    Difficulty of Paying Living Expenses: Not hard at all  Food Insecurity: No Food Insecurity   Worried About Charity fundraiser in the Last Year: Never true   [place] in the Last Year: Never true  Transportation Needs: No Transportation Needs   Lack of Transportation (Medical): No   Lack of Transportation (Non-Medical): No  Physical Activity: Inactive   Days of Exercise per Week: 0 days   Minutes of Exercise per Session: 0 min  Stress: No Stress Concern Present   Feeling of Stress : Not at all  Social Connections: Moderately Isolated   Frequency of Communication with Friends and Family: Twice a week   Frequency of Social Gatherings with Friends and Family: Twice a week   Attends Religious Services: More than 4 times per year   Active Member of [employer] or Organizations: No   Attends Archivist Meetings: Never   Marital Status: Widowed    Tobacco Counseling Counseling given: Not Answered   Clinical Intake:  Pre-visit preparation completed: Yes  Pain : No/denies pain     Nutritional Risks: None Diabetes: No  How often do you need to have someone help you when you read  instructions, pamphlets, or other written materials from your doctor or pharmacy?: 1 - Never What is the last grade level you completed in school?: college  Diabetic?no  Interpreter Needed?: No  Information entered by :: [place] of Daily Living In your present state of health, do you have any difficulty performing the following activities: 07/31/2021 07/08/2021  Hearing? N N  Vision? N N  Difficulty concentrating or making decisions? N N  Walking or climbing stairs? N N  Dressing or bathing? N N  Doing errands, shopping? N -  Preparing Food and eating ? N -  Using the Toilet? N -  In the past six months, have you accidently leaked urine? N -  Do you have problems with loss of bowel control? N -  Managing your Medications? N -  Managing your Finances? N -  Housekeeping or managing your  Housekeeping? N -  Some recent data might be hidden    Patient Care Team: Libby Maw, MD as PCP - General (Family Medicine)  Indicate any recent Medical Services you may have received from other than [facility] providers in the past year (date may be approximate).     Assessment:   This is a routine wellness examination for Jennetta.  Hearing/Vision screen Vision Screening - Comments:: Annual eye exams wear glasses   Dietary issues and exercise activities discussed: Current Exercise Habits: The patient does not participate in regular exercise at present   Goals Addressed   None    Depression Screen PHQ 2/9 Scores 07/31/2021 07/31/2021 04/18/2021 10/15/2020 10/05/2019 11/26/2017 11/13/2016  PHQ - 2 Score 0 0 0 0 0 0 0    Fall Risk Fall Risk  07/31/2021 04/18/2021 10/15/2020 10/11/2019 10/05/2019  Falls in the past year? 0 0 0 0 0  Number falls in past yr: 0 - - - 0  Injury with Fall? 0 - - - 0  Follow up Falls evaluation completed - - - Education provided;Falls prevention discussed  Comment uses cane - - - -    FALL RISK PREVENTION PERTAINING TO THE HOME:  Any stairs  in or around the home? No  If so, are there any without handrails? No  Home free of loose throw rugs in walkways, pet beds, electrical cords, etc? Yes  Adequate lighting in your home to reduce risk of falls? Yes   ASSISTIVE DEVICES UTILIZED TO PREVENT FALLS:  Life alert? No  Use of a cane, walker or w/c? Yes  Grab bars in the bathroom? Yes  Shower chair or bench in shower? Yes  Elevated toilet seat or a handicapped toilet? Yes    Cognitive Function:  Normal cognitive status assessed by direct observation by this Nurse Health Advisor. No abnormalities found.        Immunizations Immunization History  Administered Date(s) Administered   Fluad Quad(high Dose 65+) 05/06/2019, 04/18/2021   Influenza,inj,Quad PF,6+ Mos 07/06/2013   Pneumococcal Conjugate-13 07/06/2013   Pneumococcal Polysaccharide-23 04/09/2011, 06/21/2012   Tdap 04/09/2011    TDAP status: Due, Education has been provided regarding the importance of this vaccine. Advised may receive this vaccine at local pharmacy or Health Dept. Aware to provide a copy of the vaccination record if obtained from local pharmacy or Health Dept. Verbalized acceptance and understanding.  Flu Vaccine status: Up to date  Pneumococcal vaccine status: Up to date  Covid-19 vaccine status: Completed vaccines  Qualifies for Shingles Vaccine? Yes   Zostavax completed No   Shingrix Completed?: No.    Education has been provided regarding the importance of this vaccine. Patient has been advised to call insurance company to determine out of pocket expense if they have not yet received this vaccine. Advised may also receive vaccine at local pharmacy or Health Dept. Verbalized acceptance and understanding.  Screening Tests Health Maintenance  Topic Date Due   Zoster Vaccines- Shingrix (1 of 2) Never done   MAMMOGRAM  07/23/2016   TETANUS/TDAP  04/08/2021   Pneumonia Vaccine 94+ Years old  Completed   INFLUENZA VACCINE  Completed   DEXA SCAN   Completed   HPV VACCINES  Aged Out   COVID-19 Vaccine  Discontinued    Health Maintenance  Health Maintenance Due  Topic Date Due   Zoster Vaccines- Shingrix (1 of 2) Never done   MAMMOGRAM  07/23/2016   TETANUS/TDAP  04/08/2021    Colorectal cancer screening: No longer  required.   Mammogram status: No longer required due to age.  Bone Density status: Ordered 01/07/2021. Pt provided with contact info and advised to call to schedule appt.  Lung Cancer Screening: (Low Dose CT Chest recommended if Age 28-80 years, 30 pack-year currently smoking OR have quit w/in 15years.) does not qualify.   Lung Cancer Screening Referral: n/a  Additional Screening:  Hepatitis C Screening: does not qualify;   Vision Screening: Recommended annual ophthalmology exams for early detection of glaucoma and other disorders of the eye. Is the patient up to date with their annual eye exam?  Yes  Who is the provider or what is the name of the office in which the patient attends annual eye exams? Dr.Hecker  If pt is not established with a provider, would they like to be referred to a provider to establish care? No .   Dental Screening: Recommended annual dental exams for proper oral hygiene  Community Resource Referral / Chronic Care Management: CRR required this visit?  No   CCM required this visit?  No      Plan:     I have personally reviewed and noted the following in the patients chart:   Medical and social history Use of alcohol, tobacco or illicit drugs  Current medications and supplements including opioid prescriptions.  Functional ability and status Nutritional status Physical activity Advanced directives List of other physicians Hospitalizations, surgeries, and ER visits in previous 12 months Vitals Screenings to include cognitive, depression, and falls Referrals and appointments  In addition, I have reviewed and discussed with patient certain preventive protocols, quality  metrics, and best practice recommendations. A written personalized care plan for preventive services as well as general preventive health recommendations were provided to patient.     Randel Pigg, LPN   [id number]   Nurse Notes: none

## 2021-07-31 NOTE — Patient Instructions (Signed)
[name] [name] , Thank you for taking time to come for your Medicare Wellness Visit. I appreciate your ongoing commitment to your health goals. Please review the following plan we discussed and let me know if I can assist you in the future.   Screening recommendations/referrals: Colonoscopy: no longer required  Mammogram: no longer required  Bone Density: referral 01/07/2021 Recommended yearly ophthalmology/optometry visit for glaucoma screening and checkup Recommended yearly dental visit for hygiene and checkup  Vaccinations: Influenza vaccine: completed  Pneumococcal vaccine: completed  Tdap vaccine: due  Shingles vaccine: will consider     Advanced directives: will provide copies   Conditions/risks identified: none   Next appointment: none    Preventive Care 34 Years and Older, Female Preventive care refers to lifestyle choices and visits with your health care provider that can promote health and wellness. What does preventive care include? A yearly physical exam. This is also called an annual well check. Dental exams once or twice a year. Routine eye exams. Ask your health care provider how often you should have your eyes checked. Personal lifestyle choices, including: Daily care of your teeth and gums. Regular physical activity. Eating a healthy diet. Avoiding tobacco and drug use. Limiting alcohol use. Practicing safe sex. Taking low-dose aspirin every day. Taking vitamin and mineral supplements as recommended by your health care provider. What happens during an annual well check? The services and screenings done by your health care provider during your annual well check will depend on your age, overall health, lifestyle risk factors, and family history of disease. Counseling  Your health care provider may ask you questions about your: Alcohol use. Tobacco use. Drug use. Emotional well-being. Home and relationship well-being. Sexual activity. Eating habits. History  of falls. Memory and ability to understand (cognition). Work and work Statistician. Reproductive health. Screening  You may have the following tests or measurements: Height, weight, and BMI. Blood pressure. Lipid and cholesterol levels. These may be checked every 5 years, or more frequently if you are over 5 years old. Skin check. Lung cancer screening. You may have this screening every year starting at age 50 if you have a 30-pack-year history of smoking and currently smoke or have quit within the past 15 years. Fecal occult blood test (FOBT) of the stool. You may have this test every year starting at age 68. Flexible sigmoidoscopy or colonoscopy. You may have a sigmoidoscopy every 5 years or a colonoscopy every 10 years starting at age 41. Hepatitis C blood test. Hepatitis B blood test. Sexually transmitted disease (STD) testing. Diabetes screening. This is done by checking your blood sugar (glucose) after you have not eaten for a while (fasting). You may have this done every 1-3 years. Bone density scan. This is done to screen for osteoporosis. You may have this done starting at age 56. Mammogram. This may be done every 1-2 years. Talk to your health care provider about how often you should have regular mammograms. Talk with your health care provider about your test results, treatment options, and if necessary, the need for more tests. Vaccines  Your health care provider may recommend certain vaccines, such as: Influenza vaccine. This is recommended every year. Tetanus, diphtheria, and acellular pertussis (Tdap, Td) vaccine. You may need a Td booster every 10 years. Zoster vaccine. You may need this after age 18. Pneumococcal 13-valent conjugate (PCV13) vaccine. One dose is recommended after age 41. Pneumococcal polysaccharide (PPSV23) vaccine. One dose is recommended after age 55. Talk to your health care provider  about which screenings and vaccines you need and how often you need  them. This information is not intended to replace advice given to you by your health care provider. Make sure you discuss any questions you have with your health care provider. Document Released: 08/17/2015 Document Revised: 04/09/2016 Document Reviewed: 05/22/2015 Elsevier Interactive Patient Education  2017 [place] Prevention in the Home Falls can cause injuries. They can happen to people of all ages. There are many things you can do to make your home safe and to help prevent falls. What can I do on the outside of my home? Regularly fix the edges of walkways and driveways and fix any cracks. Remove anything that might make you trip as you walk through a door, such as a raised step or threshold. Trim any bushes or trees on the path to your home. Use bright outdoor lighting. Clear any walking paths of anything that might make someone trip, such as rocks or tools. Regularly check to see if handrails are loose or broken. Make sure that both sides of any steps have handrails. Any raised decks and porches should have guardrails on the edges. Have any leaves, snow, or ice cleared regularly. Use sand or salt on walking paths during winter. Clean up any spills in your garage right away. This includes oil or grease spills. What can I do in the bathroom? Use night lights. Install grab bars by the toilet and in the tub and shower. Do not use towel bars as grab bars. Use non-skid mats or decals in the tub or shower. If you need to sit down in the shower, use a plastic, non-slip stool. Keep the floor dry. Clean up any water that spills on the floor as soon as it happens. Remove soap buildup in the tub or shower regularly. Attach bath mats securely with double-sided non-slip rug tape. Do not have throw rugs and other things on the floor that can make you trip. What can I do in the bedroom? Use night lights. Make sure that you have a light by your bed that is easy to reach. Do not use  any sheets or blankets that are too big for your bed. They should not hang down onto the floor. Have a firm chair that has side arms. You can use this for support while you get dressed. Do not have throw rugs and other things on the floor that can make you trip. What can I do in the kitchen? Clean up any spills right away. Avoid walking on wet floors. Keep items that you use a lot in easy-to-reach places. If you need to reach something above you, use a strong step stool that has a grab bar. Keep electrical cords out of the way. Do not use floor polish or wax that makes floors slippery. If you must use wax, use non-skid floor wax. Do not have throw rugs and other things on the floor that can make you trip. What can I do with my stairs? Do not leave any items on the stairs. Make sure that there are handrails on both sides of the stairs and use them. Fix handrails that are broken or loose. Make sure that handrails are as long as the stairways. Check any carpeting to make sure that it is firmly attached to the stairs. Fix any carpet that is loose or worn. Avoid having throw rugs at the top or bottom of the stairs. If you do have throw rugs, attach them to the floor  with carpet tape. Make sure that you have a light switch at the top of the stairs and the bottom of the stairs. If you do not have them, ask someone to add them for you. What else can I do to help prevent falls? Wear shoes that: Do not have high heels. Have rubber bottoms. Are comfortable and fit you well. Are closed at the toe. Do not wear sandals. If you use a stepladder: Make sure that it is fully opened. Do not climb a closed stepladder. Make sure that both sides of the stepladder are locked into place. Ask someone to hold it for you, if possible. Clearly mark and make sure that you can see: Any grab bars or handrails. First and last steps. Where the edge of each step is. Use tools that help you move around (mobility aids)  if they are needed. These include: Canes. Walkers. Scooters. Crutches. Turn on the lights when you go into a dark area. Replace any light bulbs as soon as they burn out. Set up your furniture so you have a clear path. Avoid moving your furniture around. If any of your floors are uneven, fix them. If there are any pets around you, be aware of where they are. Review your medicines with your doctor. Some medicines can make you feel dizzy. This can increase your chance of falling. Ask your doctor what other things that you can do to help prevent falls. This information is not intended to replace advice given to you by your health care provider. Make sure you discuss any questions you have with your health care provider. Document Released: 05/17/2009 Document Revised: 12/27/2015 Document Reviewed: 08/25/2014 Elsevier Interactive Patient Education  2017 [employer].

## 2021-08-01 LAB — SURGICAL PATHOLOGY

## 2021-08-06 ENCOUNTER — Inpatient Hospital Stay: Payer: Medicare HMO | Admitting: Hematology & Oncology

## 2021-08-06 ENCOUNTER — Other Ambulatory Visit: Payer: Self-pay | Admitting: Hematology & Oncology

## 2021-08-06 ENCOUNTER — Telehealth: Payer: Self-pay | Admitting: *Deleted

## 2021-08-06 ENCOUNTER — Encounter: Payer: Self-pay | Admitting: Hematology & Oncology

## 2021-08-06 ENCOUNTER — Other Ambulatory Visit: Payer: Self-pay

## 2021-08-06 ENCOUNTER — Inpatient Hospital Stay: Payer: Medicare HMO | Attending: Hematology & Oncology

## 2021-08-06 ENCOUNTER — Other Ambulatory Visit: Payer: Self-pay | Admitting: *Deleted

## 2021-08-06 VITALS — BP 143/68 | HR 89 | Temp 98.0°F | Resp 16 | Wt 118.0 lb

## 2021-08-06 DIAGNOSIS — M069 Rheumatoid arthritis, unspecified: Secondary | ICD-10-CM | POA: Diagnosis not present

## 2021-08-06 DIAGNOSIS — M05711 Rheumatoid arthritis with rheumatoid factor of right shoulder without organ or systems involvement: Secondary | ICD-10-CM | POA: Diagnosis not present

## 2021-08-06 DIAGNOSIS — Z7189 Other specified counseling: Secondary | ICD-10-CM | POA: Diagnosis not present

## 2021-08-06 DIAGNOSIS — C91Z0 Other lymphoid leukemia not having achieved remission: Secondary | ICD-10-CM

## 2021-08-06 DIAGNOSIS — D709 Neutropenia, unspecified: Secondary | ICD-10-CM | POA: Insufficient documentation

## 2021-08-06 DIAGNOSIS — Z79899 Other long term (current) drug therapy: Secondary | ICD-10-CM | POA: Diagnosis not present

## 2021-08-06 HISTORY — DX: Other lymphoid leukemia not having achieved remission: C91.Z0

## 2021-08-06 HISTORY — DX: Other specified counseling: Z71.89

## 2021-08-06 LAB — CBC WITH DIFFERENTIAL (CANCER CENTER ONLY)
Abs Immature Granulocytes: 0.01 10*3/uL (ref 0.00–0.07)
Basophils Absolute: 0.1 10*3/uL (ref 0.0–0.1)
Basophils Relative: 1 %
Eosinophils Absolute: 0.1 10*3/uL (ref 0.0–0.5)
Eosinophils Relative: 1 %
HCT: 37 % (ref 36.0–46.0)
Hemoglobin: 11.4 g/dL — ABNORMAL LOW (ref 12.0–15.0)
Immature Granulocytes: 0 %
Lymphocytes Relative: 82 %
Lymphs Abs: 7.7 10*3/uL — ABNORMAL HIGH (ref 0.7–4.0)
MCH: 24.9 pg — ABNORMAL LOW (ref 26.0–34.0)
MCHC: 30.8 g/dL (ref 30.0–36.0)
MCV: 81 fL (ref 80.0–100.0)
Monocytes Absolute: 0.9 10*3/uL (ref 0.1–1.0)
Monocytes Relative: 9 %
Neutro Abs: 0.7 10*3/uL — ABNORMAL LOW (ref 1.7–7.7)
Neutrophils Relative %: 7 %
Platelet Count: 185 10*3/uL (ref 150–400)
RBC: 4.57 MIL/uL (ref 3.87–5.11)
RDW: 15.1 % (ref 11.5–15.5)
WBC Count: 9.4 10*3/uL (ref 4.0–10.5)
nRBC: 0 % (ref 0.0–0.2)

## 2021-08-06 LAB — CMP (CANCER CENTER ONLY)
ALT: 9 U/L (ref 0–44)
AST: 19 U/L (ref 15–41)
Albumin: 4 g/dL (ref 3.5–5.0)
Alkaline Phosphatase: 70 U/L (ref 38–126)
Anion gap: 9 (ref 5–15)
BUN: 21 mg/dL (ref 8–23)
CO2: 25 mmol/L (ref 22–32)
Calcium: 9.8 mg/dL (ref 8.9–10.3)
Chloride: 104 mmol/L (ref 98–111)
Creatinine: 0.97 mg/dL (ref 0.44–1.00)
GFR, Estimated: 58 mL/min — ABNORMAL LOW (ref >60)
Glucose, Bld: 97 mg/dL (ref 70–99)
Potassium: 4.2 mmol/L (ref 3.5–5.1)
Sodium: 138 mmol/L (ref 135–145)
Total Bilirubin: 0.5 mg/dL (ref 0.3–1.2)
Total Protein: 7.4 g/dL (ref 6.5–8.1)

## 2021-08-06 LAB — SAVE SMEAR(SSMR), FOR PROVIDER SLIDE REVIEW

## 2021-08-06 LAB — LACTATE DEHYDROGENASE: LDH: 195 U/L — ABNORMAL HIGH (ref 98–192)

## 2021-08-06 MED ORDER — METHOTREXATE SODIUM 5 MG PO TABS: 15.0000 mg | ORAL_TABLET | ORAL | 4 refills | Status: DC

## 2021-08-06 MED ORDER — METHOTREXATE 2.5 MG PO TABS: 15.0000 mg | ORAL_TABLET | ORAL | 4 refills | Status: DC

## 2021-08-06 NOTE — Progress Notes (Signed)
Hematology and Oncology Follow Up Visit  [name] [name] [id number] Dec 07, 1934 87 y.o. 08/06/2021   Principle Diagnosis:  Large granular lymphocytic leukemia Rheumatoid arthritis  Current Therapy:   Methotrexate 15 mg p.o. weekly- on 07/19/2021     Interim History:  [name] [name] is back for follow-up.  She has not yet started the methotrexate.  Apparently, the pharmacy did not have the 5 mg dosage.  They did not let us know.  As such, we will call in the 2.5 mg dosage.  I told [name] [name] that she does not have to take all 6 pills at 1 time.  She can split the 6 pills up during the day.  Otherwise she feels okay.  She had no problems over Christmas or New Year's.  She was with her son.  She has had no problems with the rheumatoid arthritis.  I do not  think this is flared up on her.  Unfortunately, we do not have back yet the differential on the white blood cells.  It would not surprise me if she is still neutropenic.  She has had no change in bowel or bladder habits.  She has had no bleeding.  There is been no rashes.  She has had no headache.  Overall, her performance status is ECOG 1.   Medications:  Current Outpatient Medications:    acetaminophen (TYLENOL) 650 MG CR tablet, Take 650 mg by mouth every 8 (eight) hours as needed for pain., Disp: , Rfl:    benazepril (LOTENSIN) 10 MG tablet, Take 1 tablet (10 mg total) by mouth daily., Disp: 90 tablet, Rfl: 1   Echinacea-Goldenseal (IMMUNE HEALTH BLEND PO), Take 500 mg by mouth daily. (Patient not taking: Reported on 06/11/2021), Disp: , Rfl:    leflunomide (ARAVA) 20 MG tablet, Take 1 tablet (20 mg total) by mouth daily., Disp: 30 tablet, Rfl: 2   levothyroxine (SYNTHROID) 75 MCG tablet, TAKE 1 TABLET BY MOUTH EVERY DAY, Disp: 90 tablet, Rfl: 3   methotrexate (RHEUMATREX) 5 MG tablet, Take 3 tablets (15 mg total) by mouth once a week. Caution: Chemotherapy. Protect from light., Disp: 12 tablet, Rfl: 4   Multiple Minerals-Vitamins  (CALCIUM CITRATE PLUS/MAGNESIUM) TABS, Take 1 tablet by mouth daily., Disp: , Rfl:    Multiple Vitamins-Minerals (MULTIVITAMIN WITH MINERALS) tablet, Take 1 tablet by mouth daily., Disp: , Rfl:    Polyethyl Glycol-Propyl Glycol (SYSTANE OP), Apply to eye daily., Disp: , Rfl:    pravastatin (PRAVACHOL) 20 MG tablet, Take 1 tablet (20 mg total) by mouth every evening., Disp: 90 tablet, Rfl: 3  Allergies:  Allergies  Allergen Reactions   Tramadol Nausea Only   Codeine Phosphate Nausea Only    REACTION: unspecified    Past Medical History, Surgical history, Social history, and Family History were reviewed and updated.  Review of Systems: Review of Systems  Constitutional:  Positive for fatigue.  HENT:  Negative.    Eyes: Negative.   Respiratory: Negative.    Cardiovascular: Negative.   Gastrointestinal: Negative.   Endocrine: Negative.   Genitourinary: Negative.    Musculoskeletal:  Positive for arthralgias and myalgias.  Skin: Negative.   Neurological: Negative.   Hematological: Negative.   Psychiatric/Behavioral: Negative.     Physical Exam:  weight is 118 lb (53.5 kg). Her oral temperature is 98 F (36.7 C). Her blood pressure is 143/68 (abnormal) and her pulse is 89. Her respiration is 16 and oxygen saturation is 99%.   Wt Readings from Last 3 Encounters:  08/06/21 118 lb (53.5 kg)  07/15/21 121 lb (54.9 kg)  07/08/21 117 lb (53.1 kg)    Physical Exam Vitals reviewed.  HENT:     Head: Normocephalic and atraumatic.  Eyes:     Pupils: Pupils are equal, round, and reactive to light.  Cardiovascular:     Rate and Rhythm: Normal rate and regular rhythm.     Heart sounds: Normal heart sounds.  Pulmonary:     Effort: Pulmonary effort is normal.     Breath sounds: Normal breath sounds.  Abdominal:     General: Bowel sounds are normal.     Palpations: Abdomen is soft.  Musculoskeletal:        General: No tenderness or deformity. Normal range of motion.     Cervical  back: Normal range of motion.  Lymphadenopathy:     Cervical: No cervical adenopathy.  Skin:    General: Skin is warm and dry.     Findings: No erythema or rash.  Neurological:     Mental Status: She is alert and oriented to person, place, and time.  Psychiatric:        Behavior: Behavior normal.        Thought Content: Thought content normal.        Judgment: Judgment normal.     Lab Results  Component Value Date   WBC 9.4 08/06/2021   HGB 11.4 (L) 08/06/2021   HCT 37.0 08/06/2021   MCV 81.0 08/06/2021   PLT 185 08/06/2021     Chemistry      Component Value Date/Time   NA 138 08/06/2021 1201   NA 141 02/03/2019 1129   K 4.2 08/06/2021 1201   CL 104 08/06/2021 1201   CO2 25 08/06/2021 1201   BUN 21 08/06/2021 1201   BUN 31 (H) 02/03/2019 1129   CREATININE 0.97 08/06/2021 1201   CREATININE 1.00 (H) 05/20/2021 1545      Component Value Date/Time   CALCIUM 9.8 08/06/2021 1201   ALKPHOS 70 08/06/2021 1201   AST 19 08/06/2021 1201   ALT 9 08/06/2021 1201   BILITOT 0.5 08/06/2021 1201      Impression and Plan: [name] [name] is a very charming 87 year old white female.  She has rheumatoid arthritis.  She has marked lymphocytosis with neutropenia.  As such, I believe that the bone marrow is consistent with large granular lymphocytic leukemia.  Hopefully, she will be able to get the methotrexate this week.  Again, I told her that she can break up the 6 tablets anyway that she would like.  I would like to see her back probably in February now.  Hopefully by then, will start to see her lymphocytosis go down and then her neutrophils go back up.  I want her quality of life to be a priority.  We really have to make sure that her quality of life is given full attention.   Volanda Napoleon, MD 1/3/20231:00 PM

## 2021-08-06 NOTE — Telephone Encounter (Signed)
Per 08/06/20 los - gave upcoming appointments - confirmed °

## 2021-08-07 ENCOUNTER — Other Ambulatory Visit: Payer: Self-pay | Admitting: *Deleted

## 2021-08-07 DIAGNOSIS — C91Z0 Other lymphoid leukemia not having achieved remission: Secondary | ICD-10-CM

## 2021-08-07 MED ORDER — METHOTREXATE 2.5 MG PO TABS: 15.0000 mg | ORAL_TABLET | ORAL | 4 refills | Status: DC

## 2021-08-16 NOTE — Progress Notes (Signed)
She has leukemia in the bone marrow.  We have to have cytogenetics to help decide treatment.

## 2021-08-31 ENCOUNTER — Other Ambulatory Visit: Payer: Self-pay | Admitting: Rheumatology

## 2021-09-02 NOTE — Telephone Encounter (Signed)
Next Visit: 09/16/2021  Last Visit: 06/11/2021  Last Fill: 06/05/2021  DX: Rheumatoid arthritis involving multiple sites with positive rheumatoid factor   Current Dose per office note 06/11/2021: Arava 20 mg 1 tablet by mouth daily  Labs: 08/06/2021 Hgb 11.4, MCH 24.9, Neutro Abs 0.7, Lymphs Abs 7.7, GFR 58  Okay to refill Arava?

## 2021-09-11 ENCOUNTER — Ambulatory Visit: Payer: Medicare HMO | Admitting: Rheumatology

## 2021-09-11 NOTE — Progress Notes (Signed)
Office Visit Note  Patient: [name] [name]             Date of Birth: [birth year]           MRN: [id number]             PCP: Libby Maw, MD Referring: Libby Maw,* Visit Date: 09/16/2021 Occupation: @GUAROCC @  Subjective:  Medication monitoring   History of Present Illness: JORETTA EADS is a 87 y.o. female with history of seropositive rheumatoid arthritis and osteoarthritis. She is taking Arava 20 mg 1 tablet by mouth daily.  She is tolerating Arava without any side effects and has not missed any doses recently.  She denies any signs or symptoms of a rheumatoid arthritis flare.  She has not had any morning stiffness or nocturnal pain.  Overall she has noticed a significant improvement in her joint pain and inflammation on her Reyvow.  She does not make any medication changes at this time.  She has not had any recent infections.  She denies any new medical conditions since her last office visit. She has not updated her bone density yet.     Activities of Daily Living:  Patient reports morning stiffness for 0 minutes  Patient Denies nocturnal pain.  Difficulty dressing/grooming: Denies Difficulty climbing stairs: Denies Difficulty getting out of chair: Denies Difficulty using hands for taps, buttons, cutlery, and/or writing: Denies  Review of Systems  Constitutional:  Negative for fatigue.  HENT:  Negative for mouth sores, mouth dryness and nose dryness.   Eyes:  Negative for pain, visual disturbance and dryness.  Respiratory:  Negative for cough, hemoptysis, shortness of breath and difficulty breathing.   Cardiovascular:  Negative for chest pain, palpitations, hypertension and swelling in legs/feet.  Gastrointestinal:  Negative for blood in stool, constipation and diarrhea.  Endocrine: Negative for increased urination.  Genitourinary:  Negative for painful urination.  Musculoskeletal:  Negative for joint pain, joint pain, joint swelling, myalgias,  muscle weakness, morning stiffness, muscle tenderness and myalgias.  Skin:  Negative for color change, pallor, rash, hair loss, nodules/bumps, skin tightness, ulcers and sensitivity to sunlight.  Allergic/Immunologic: Negative for susceptible to infections.  Neurological:  Negative for dizziness, numbness, headaches and weakness.  Hematological:  Negative for swollen glands.  Psychiatric/Behavioral:  Negative for depressed mood and sleep disturbance. The patient is not nervous/anxious.    PMFS History:  Patient Active Problem List   Diagnosis Date Noted   Large granular lymphocytic leukemia ([place]) 08/06/2021   Goals of care, counseling/discussion 08/06/2021   Need for influenza vaccination 04/18/2021   Iron deficiency anemia 11/20/2020   Lymphocytosis 10/16/2020   Neutropenia ([place]) 10/16/2020   Paroxysmal supraventricular tachycardia ([facility]) 10/15/2020   Elevated LDL cholesterol level 01/12/2020   DDD (degenerative disc disease), cervical 07/13/2018   Chronic kidney disease (CKD) stage G3b 01/21/2017   History of hypothyroidism 01/20/2017   History of congestive heart failure 08/26/2016   Osteopenia of multiple sites 08/25/2016   DDD cervical spine 08/25/2016   Primary osteoarthritis of both hands 08/25/2016   High risk medication use 06/11/2016   CHF, acute ([place]) 02/01/2013   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   Hypothyroidism 01/19/2007   Dyslipidemia 01/19/2007   Essential hypertension 01/19/2007   Rheumatoid arthritis ([place]) 01/19/2007    Past Medical History:  Diagnosis Date   Goals of care, counseling/discussion 08/06/2021   HYPERLIPIDEMIA 01/19/2007   HYPERTENSION 01/19/2007   HYPOTHYROIDISM 01/19/2007   Large granular lymphocytic leukemia ([place]) 08/06/2021   Rheumatoid arthritis(714.0) 01/19/2007  Family History  Problem Relation Age of Onset   Diabetes Father    Heart disease Father    Cancer Sister        breast    Heart disease Sister    Heart attack Brother    Heart  attack Brother    Heart attack Brother    Past Surgical History:  Procedure Laterality Date   ABDOMINAL HYSTERECTOMY     APPENDECTOMY     CHOLECYSTECTOMY     NECK SURGERY     Social History   Social History Narrative   Not on file   Immunization History  Administered Date(s) Administered   Fluad Quad(high Dose 65+) 05/06/2019, 04/18/2021   Influenza,inj,Quad PF,6+ Mos 07/06/2013   Pneumococcal Conjugate-13 07/06/2013   Pneumococcal Polysaccharide-23 04/09/2011, 06/21/2012   Tdap 04/09/2011     Objective: Vital Signs: BP (!) 150/79 (BP Location: Left Arm, Patient Position: Sitting, Cuff Size: Small)    Pulse 87    Resp 13    Ht 5\' 2"  (1.575 m)    Wt 119 lb 12.8 oz (54.3 kg)    BMI 21.91 kg/m    Physical Exam Vitals and nursing note reviewed.  Constitutional:      Appearance: She is well-developed.  HENT:     Head: Normocephalic and atraumatic.  Eyes:     Conjunctiva/sclera: Conjunctivae normal.  Pulmonary:     Effort: Pulmonary effort is normal.  Abdominal:     Palpations: Abdomen is soft.  Musculoskeletal:     Cervical back: Normal range of motion.  Skin:    General: Skin is warm and dry.     Capillary Refill: Capillary refill takes less than 2 seconds.  Neurological:     Mental Status: She is alert and oriented to person, place, and time.  Psychiatric:        Behavior: Behavior normal.     Musculoskeletal Exam: C-spine has good range of motion with no discomfort.  Thoracic kyphosis noted.  Shoulder joints, elbow joints, wrist joints, MCPs, PIPs, DIPs have good range of motion with no synovitis.  PIP and DIP thickening consistent with osteoarthritis of both hands.  Complete fist formation bilaterally.  Hip joints have limited range of motion but no groin pain was noted.  Knee joints have good range of motion with no warmth or effusion.  Ankle joints have good range of motion with no tenderness or joint swelling.  Pedal edema noted bilaterally.   CDAI Exam: CDAI  Score: 0  Patient Global: 0 mm; Provider Global: 0 mm Swollen: 0 ; Tender: 0  Joint Exam 09/16/2021   No joint exam has been documented for this visit   There is currently no information documented on the homunculus. Go to the Rheumatology activity and complete the homunculus joint exam.  Investigation: No additional findings.  Imaging: No results found.  Recent Labs: Lab Results  Component Value Date   WBC 7.0 09/12/2021   HGB 11.9 (L) 09/12/2021   PLT 321 09/12/2021   NA 140 09/12/2021   K 5.0 09/12/2021   CL 102 09/12/2021   CO2 28 09/12/2021   GLUCOSE 136 (H) 09/12/2021   BUN 23 09/12/2021   CREATININE 1.27 (H) 09/12/2021   BILITOT 0.4 09/12/2021   ALKPHOS 80 09/12/2021   AST 18 09/12/2021   ALT 8 09/12/2021   PROT 7.9 09/12/2021   ALBUMIN 4.0 09/12/2021   CALCIUM 10.4 (H) 09/12/2021   GFRAA 50 (L) 08/09/2020    Speciality Comments: PLQ Eye Exam:  10/04/2019 @ [facility]  Prior therapy: Methotrexate (lung injury)   Procedures:  No procedures performed Allergies: Tramadol and Codeine phosphate   Assessment / Plan:     Visit Diagnoses: Rheumatoid arthritis involving multiple sites with positive rheumatoid factor ([place]): She has no joint tenderness or synovitis on examination today.  She has not had any signs or symptoms of a rheumatoid arthritis flare.  She is clinically doing well taking Arava 20 mg 1 tablet by mouth daily.  She is tolerating Arava without any side effects and has not missed any doses recently.  She has not had any recent infections.  She is not having any difficulty with ADLs.  She has not had any nocturnal pain or morning stiffness.  She will remain on her Reyvow as monotherapy.  She was advised to notify us if she develops increased joint pain or joint swelling.  She will follow-up in the office in 5 months.  High risk medication use - Arava 20 mg 1 tablet by mouth daily.  Previous therapy: MTX (d/c due to elevated creatinine) and Plaquenil.   CBC and CMP updated on 09/12/21.  She will be due to update lab work in May and every 3 months.  Standing orders for CBC and CMP remain in place.  She has not had any recent infections.  Discussed the importance of holding her Reyvow if she develops signs or symptoms of an infection and to resume once the infection is completely cleared.  Primary osteoarthritis of both hands: She has PIP and DIP thickening consistent with osteoarthritis of both hands.  No tenderness or inflammation was noted on examination.  She was able to make a complete fist bilaterally.  Osteopenia of multiple sites: Order for DEXA remains in place.  Patient was given the information to call [facility] to schedule an updated bone density.  She has not had any recent falls or fractures.  She has been using a cane to assist with ambulation.  Lymphocytosis -She has established care with Dr. Marin Olp.  Absolute lymphocytic count was 4.4 on 09/12/2021-improving.   Other neutropenia ([place]): White blood cell count was 7.0 on 09/12/2021.  Absolute neutrophil count low at 1.5 but has improved.  Other medical conditions are listed as follows:  Dyslipidemia  Chronic kidney disease (CKD) stage G3b: Creatinine was 1.27 and GFR was 42 on 09/12/2021.  History of hypothyroidism  History of congestive heart failure  Essential hypertension  History of iron deficiency anemia  Orders: No orders of the defined types were placed in this encounter.  No orders of the defined types were placed in this encounter.   Follow-Up Instructions: Return in about 5 months (around 02/13/2022) for Rheumatoid arthritis, Osteoarthritis.   Ofilia Neas, PA-C  Note - This record has been created using Dragon software.  Chart creation errors have been sought, but may not always  have been located. Such creation errors do not reflect on  the standard of medical care.

## 2021-09-12 ENCOUNTER — Other Ambulatory Visit: Payer: Self-pay

## 2021-09-12 ENCOUNTER — Inpatient Hospital Stay: Payer: Medicare HMO | Attending: Hematology & Oncology

## 2021-09-12 ENCOUNTER — Inpatient Hospital Stay: Payer: Medicare HMO | Admitting: Hematology & Oncology

## 2021-09-12 ENCOUNTER — Telehealth: Payer: Self-pay | Admitting: *Deleted

## 2021-09-12 ENCOUNTER — Encounter: Payer: Self-pay | Admitting: Hematology & Oncology

## 2021-09-12 VITALS — BP 95/60 | HR 95 | Temp 98.4°F | Resp 20 | Ht 61.42 in | Wt 115.8 lb

## 2021-09-12 DIAGNOSIS — C91Z0 Other lymphoid leukemia not having achieved remission: Secondary | ICD-10-CM

## 2021-09-12 DIAGNOSIS — D709 Neutropenia, unspecified: Secondary | ICD-10-CM | POA: Diagnosis not present

## 2021-09-12 DIAGNOSIS — M069 Rheumatoid arthritis, unspecified: Secondary | ICD-10-CM | POA: Diagnosis not present

## 2021-09-12 DIAGNOSIS — M05711 Rheumatoid arthritis with rheumatoid factor of right shoulder without organ or systems involvement: Secondary | ICD-10-CM

## 2021-09-12 DIAGNOSIS — Z79899 Other long term (current) drug therapy: Secondary | ICD-10-CM | POA: Insufficient documentation

## 2021-09-12 LAB — CBC WITH DIFFERENTIAL (CANCER CENTER ONLY)
Abs Immature Granulocytes: 0.01 10*3/uL (ref 0.00–0.07)
Basophils Absolute: 0.1 10*3/uL (ref 0.0–0.1)
Basophils Relative: 2 %
Eosinophils Absolute: 0 10*3/uL (ref 0.0–0.5)
Eosinophils Relative: 1 %
HCT: 38.6 % (ref 36.0–46.0)
Hemoglobin: 11.9 g/dL — ABNORMAL LOW (ref 12.0–15.0)
Immature Granulocytes: 0 %
Lymphocytes Relative: 62 %
Lymphs Abs: 4.4 10*3/uL — ABNORMAL HIGH (ref 0.7–4.0)
MCH: 25.2 pg — ABNORMAL LOW (ref 26.0–34.0)
MCHC: 30.8 g/dL (ref 30.0–36.0)
MCV: 81.8 fL (ref 80.0–100.0)
Monocytes Absolute: 1 10*3/uL (ref 0.1–1.0)
Monocytes Relative: 14 %
Neutro Abs: 1.5 10*3/uL — ABNORMAL LOW (ref 1.7–7.7)
Neutrophils Relative %: 21 %
Platelet Count: 321 10*3/uL (ref 150–400)
RBC: 4.72 MIL/uL (ref 3.87–5.11)
RDW: 15.7 % — ABNORMAL HIGH (ref 11.5–15.5)
WBC Count: 7 10*3/uL (ref 4.0–10.5)
nRBC: 0 % (ref 0.0–0.2)

## 2021-09-12 LAB — CMP (CANCER CENTER ONLY)
ALT: 8 U/L (ref 0–44)
AST: 18 U/L (ref 15–41)
Albumin: 4 g/dL (ref 3.5–5.0)
Alkaline Phosphatase: 80 U/L (ref 38–126)
Anion gap: 10 (ref 5–15)
BUN: 23 mg/dL (ref 8–23)
CO2: 28 mmol/L (ref 22–32)
Calcium: 10.4 mg/dL — ABNORMAL HIGH (ref 8.9–10.3)
Chloride: 102 mmol/L (ref 98–111)
Creatinine: 1.27 mg/dL — ABNORMAL HIGH (ref 0.44–1.00)
GFR, Estimated: 42 mL/min — ABNORMAL LOW (ref >60)
Glucose, Bld: 136 mg/dL — ABNORMAL HIGH (ref 70–99)
Potassium: 5 mmol/L (ref 3.5–5.1)
Sodium: 140 mmol/L (ref 135–145)
Total Bilirubin: 0.4 mg/dL (ref 0.3–1.2)
Total Protein: 7.9 g/dL (ref 6.5–8.1)

## 2021-09-12 LAB — LACTATE DEHYDROGENASE: LDH: 218 U/L — ABNORMAL HIGH (ref 98–192)

## 2021-09-12 LAB — SAVE SMEAR(SSMR), FOR PROVIDER SLIDE REVIEW

## 2021-09-12 NOTE — Telephone Encounter (Signed)
Per 09/12/21 los - gave upcoming appointments - confirmed °

## 2021-09-12 NOTE — Progress Notes (Signed)
Hematology and Oncology Follow Up Visit  [name] [name] [id number] Jul 17, 1935 87 y.o. 09/12/2021   Principle Diagnosis:  Large granular lymphocytic leukemia Rheumatoid arthritis  Current Therapy:   Methotrexate 15 mg p.o. weekly- on 07/27/2021     Interim History:  [name] [name] is back for follow-up.  She is doing well with the methotrexate.  She is tolerating this well.  Is clearly working.  Her neutrophils are going up.  She has had no flareups of the rheumatoid arthritis.  I think she sees the Rheumatologist next week.  She has had a poor appetite.  This is always been the case.  She does does not like to eat all that much.  Her weight is down 3 pounds since we last saw her.  I told her that she really cannot lose much more in the way of weight or this will stress her heart.  She has had no fever.  She has had no bleeding.  There has been no obvious change in bowel or bladder habits.  Overall, I would have to say that her performance status is probably ECOG 2.     Medications:  Current Outpatient Medications:    leflunomide (ARAVA) 20 MG tablet, TAKE 1 TABLET BY MOUTH EVERY DAY, Disp: 90 tablet, Rfl: 0   acetaminophen (TYLENOL) 650 MG CR tablet, Take 650 mg by mouth every 8 (eight) hours as needed for pain., Disp: , Rfl:    benazepril (LOTENSIN) 10 MG tablet, Take 1 tablet (10 mg total) by mouth daily., Disp: 90 tablet, Rfl: 1   Echinacea-Goldenseal (IMMUNE HEALTH BLEND PO), Take 500 mg by mouth daily. (Patient not taking: Reported on 06/11/2021), Disp: , Rfl:    levothyroxine (SYNTHROID) 75 MCG tablet, TAKE 1 TABLET BY MOUTH EVERY DAY, Disp: 90 tablet, Rfl: 3   methotrexate (RHEUMATREX) 2.5 MG tablet, Take 6 tablets (15 mg total) by mouth once a week. Caution:Chemotherapy. Protect from light., Disp: 24 tablet, Rfl: 4   Multiple Minerals-Vitamins (CALCIUM CITRATE PLUS/MAGNESIUM) TABS, Take 1 tablet by mouth daily., Disp: , Rfl:    Multiple Vitamins-Minerals (MULTIVITAMIN WITH  MINERALS) tablet, Take 1 tablet by mouth daily., Disp: , Rfl:    Polyethyl Glycol-Propyl Glycol (SYSTANE OP), Apply to eye daily., Disp: , Rfl:    pravastatin (PRAVACHOL) 20 MG tablet, Take 1 tablet (20 mg total) by mouth every evening., Disp: 90 tablet, Rfl: 3  Allergies:  Allergies  Allergen Reactions   Tramadol Nausea Only   Codeine Phosphate Nausea Only    REACTION: unspecified    Past Medical History, Surgical history, Social history, and Family History were reviewed and updated.  Review of Systems: Review of Systems  Constitutional:  Positive for fatigue.  HENT:  Negative.    Eyes: Negative.   Respiratory: Negative.    Cardiovascular: Negative.   Gastrointestinal: Negative.   Endocrine: Negative.   Genitourinary: Negative.    Musculoskeletal:  Positive for arthralgias and myalgias.  Skin: Negative.   Neurological: Negative.   Hematological: Negative.   Psychiatric/Behavioral: Negative.     Physical Exam:  vitals were not taken for this visit.   Wt Readings from Last 3 Encounters:  08/06/21 118 lb (53.5 kg)  07/15/21 121 lb (54.9 kg)  07/08/21 117 lb (53.1 kg)    Physical Exam Vitals reviewed.  HENT:     Head: Normocephalic and atraumatic.  Eyes:     Pupils: Pupils are equal, round, and reactive to light.  Cardiovascular:     Rate and Rhythm: Normal  rate and regular rhythm.     Heart sounds: Normal heart sounds.  Pulmonary:     Effort: Pulmonary effort is normal.     Breath sounds: Normal breath sounds.  Abdominal:     General: Bowel sounds are normal.     Palpations: Abdomen is soft.  Musculoskeletal:        General: No tenderness or deformity. Normal range of motion.     Cervical back: Normal range of motion.  Lymphadenopathy:     Cervical: No cervical adenopathy.  Skin:    General: Skin is warm and dry.     Findings: No erythema or rash.  Neurological:     Mental Status: She is alert and oriented to person, place, and time.  Psychiatric:         Behavior: Behavior normal.        Thought Content: Thought content normal.        Judgment: Judgment normal.     Lab Results  Component Value Date   WBC 7.0 09/12/2021   HGB 11.9 (L) 09/12/2021   HCT 38.6 09/12/2021   MCV 81.8 09/12/2021   PLT 321 09/12/2021     Chemistry      Component Value Date/Time   NA 138 08/06/2021 1201   NA 141 02/03/2019 1129   K 4.2 08/06/2021 1201   CL 104 08/06/2021 1201   CO2 25 08/06/2021 1201   BUN 21 08/06/2021 1201   BUN 31 (H) 02/03/2019 1129   CREATININE 0.97 08/06/2021 1201   CREATININE 1.00 (H) 05/20/2021 1545      Component Value Date/Time   CALCIUM 9.8 08/06/2021 1201   ALKPHOS 70 08/06/2021 1201   AST 19 08/06/2021 1201   ALT 9 08/06/2021 1201   BILITOT 0.5 08/06/2021 1201      Impression and Plan: [name] [name] is a very charming 87 year old white female.  She has rheumatoid arthritis.  She has marked lymphocytosis with neutropenia.  As such, she had a bone marrow biopsy done.  The results of the bone marrow biopsy are very consistent  with large granular lymphocytic leukemia.  The methotrexate is working.  Her immune system is doing better.  Her absolute neutrophil count is up.  We will not make any changes with the methotrexate dose.  She is doing well with the 15 mg weekly dose.  I think we can move her appointments out a little bit more.  We will plan to get her back in about 2 months or so.   Volanda Napoleon, MD 2/9/202310:20 AM

## 2021-09-16 ENCOUNTER — Encounter: Payer: Self-pay | Admitting: Physician Assistant

## 2021-09-16 ENCOUNTER — Other Ambulatory Visit: Payer: Self-pay

## 2021-09-16 ENCOUNTER — Ambulatory Visit: Payer: Medicare HMO | Admitting: Physician Assistant

## 2021-09-16 VITALS — BP 150/79 | HR 87 | Resp 13 | Ht 62.0 in | Wt 119.8 lb

## 2021-09-16 DIAGNOSIS — Z8639 Personal history of other endocrine, nutritional and metabolic disease: Secondary | ICD-10-CM

## 2021-09-16 DIAGNOSIS — D708 Other neutropenia: Secondary | ICD-10-CM | POA: Diagnosis not present

## 2021-09-16 DIAGNOSIS — I10 Essential (primary) hypertension: Secondary | ICD-10-CM

## 2021-09-16 DIAGNOSIS — Z862 Personal history of diseases of the blood and blood-forming organs and certain disorders involving the immune mechanism: Secondary | ICD-10-CM

## 2021-09-16 DIAGNOSIS — Z79899 Other long term (current) drug therapy: Secondary | ICD-10-CM

## 2021-09-16 DIAGNOSIS — N1832 Chronic kidney disease, stage 3b: Secondary | ICD-10-CM

## 2021-09-16 DIAGNOSIS — M19041 Primary osteoarthritis, right hand: Secondary | ICD-10-CM | POA: Diagnosis not present

## 2021-09-16 DIAGNOSIS — M8589 Other specified disorders of bone density and structure, multiple sites: Secondary | ICD-10-CM

## 2021-09-16 DIAGNOSIS — E785 Hyperlipidemia, unspecified: Secondary | ICD-10-CM

## 2021-09-16 DIAGNOSIS — Z8679 Personal history of other diseases of the circulatory system: Secondary | ICD-10-CM

## 2021-09-16 DIAGNOSIS — D72820 Lymphocytosis (symptomatic): Secondary | ICD-10-CM

## 2021-09-16 DIAGNOSIS — M19042 Primary osteoarthritis, left hand: Secondary | ICD-10-CM

## 2021-09-16 DIAGNOSIS — M0579 Rheumatoid arthritis with rheumatoid factor of multiple sites without organ or systems involvement: Secondary | ICD-10-CM

## 2021-09-16 NOTE — Patient Instructions (Signed)
Standing Labs We placed an order today for your standing lab work.   Please have your standing labs drawn in May and every 3 months   If possible, please have your labs drawn 2 weeks prior to your appointment so that the provider can discuss your results at your appointment.  Please note that you may see your imaging and lab results in [facility] before we have reviewed them. We may be awaiting multiple results to interpret others before contacting you. Please allow our office up to 72 hours to thoroughly review all of the results before contacting the office for clarification of your results.  We have open lab daily: Monday through Thursday from 1:30-4:30 PM and Friday from 1:30-4:00 PM at the office of Dr. Bo Merino, [facility].   Please be advised, all patients with office appointments requiring lab work will take precedent over walk-in lab work.  If possible, please come for your lab work on Monday and Friday afternoons, as you may experience shorter wait times. The office is located at [address] [place], [place], [place] [address] No appointment is necessary.   Labs are drawn by [facility]. Please bring your co-pay at the time of your lab draw.  You may receive a bill from [facility] for your lab work.  Please note if you are on Hydroxychloroquine and and an order has been placed for a Hydroxychloroquine level, you will need to have it drawn 4 hours or more after your last dose.  If you wish to have your labs drawn at another location, please call the office 24 hours in advance to send orders.  If you have any questions regarding directions or hours of operation,  please call [phone].   As a reminder, please drink plenty of water prior to coming for your lab work. Thanks!  If you have signs or symptoms of an infection or start antibiotics: First, call your PCP for workup of your infection. Hold your medication through the infection, until you complete your  antibiotics, and until symptoms resolve if you take the following: Injectable medication (Actemra, Benlysta, Cimzia, Cosentyx, Enbrel, Humira, Kevzara, Orencia, Remicade, Simponi, Stelara, Taltz, Tremfya) Methotrexate Leflunomide (Arava) Mycophenolate (Cellcept) Morrie Sheldon, Olumiant, or Rinvoq

## 2021-10-16 ENCOUNTER — Ambulatory Visit: Payer: Medicare HMO | Admitting: Family Medicine

## 2021-10-17 ENCOUNTER — Ambulatory Visit: Payer: Medicare HMO | Admitting: Family Medicine

## 2021-10-21 ENCOUNTER — Other Ambulatory Visit: Payer: Self-pay

## 2021-10-21 ENCOUNTER — Encounter: Payer: Self-pay | Admitting: Family Medicine

## 2021-10-21 ENCOUNTER — Ambulatory Visit (INDEPENDENT_AMBULATORY_CARE_PROVIDER_SITE_OTHER): Payer: Medicare HMO | Admitting: Family Medicine

## 2021-10-21 VITALS — BP 118/68 | HR 96 | Temp 97.1°F | Ht 62.0 in | Wt 113.4 lb

## 2021-10-21 DIAGNOSIS — E7800 Pure hypercholesterolemia, unspecified: Secondary | ICD-10-CM

## 2021-10-21 DIAGNOSIS — E039 Hypothyroidism, unspecified: Secondary | ICD-10-CM

## 2021-10-21 DIAGNOSIS — Z8679 Personal history of other diseases of the circulatory system: Secondary | ICD-10-CM

## 2021-10-21 DIAGNOSIS — N1832 Chronic kidney disease, stage 3b: Secondary | ICD-10-CM

## 2021-10-21 MED ORDER — SITAGLIPTIN PHOSPHATE 25 MG PO TABS: 25.0000 mg | ORAL_TABLET | Freq: Every day | ORAL | 2 refills | Status: DC

## 2021-10-21 NOTE — Progress Notes (Signed)
? ?Established Patient Office Visit ? ?Subjective:  ?Patient ID: [name] [name], female    DOB: May 19, 1935  Age: 87 y.o. MRN: [id number] ? ?CC:  ?Chief Complaint  ?Patient presents with  ? Follow-up  ?  6 month follow up, no concerns. Patient fasting  ? ? ?HPI ?[facility] presents for follow-up of hypothyroidism, CKD and elevated LDL cholesterol.  Hypothyroidism is well controlled current dose of paroxetine.  LDL cholesterol well controlled with pravastatin.  Renal function has been waning.  Has been difficult for patient to increase fluid intake.  She is here today with her son, [name]. ? ?Past Medical History:  ?Diagnosis Date  ? Goals of care, counseling/discussion 08/06/2021  ? HYPERLIPIDEMIA 01/19/2007  ? HYPERTENSION 01/19/2007  ? HYPOTHYROIDISM 01/19/2007  ? 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 Rheumatoid arthritis(714.0) 01/19/2007  ? ? ?Past Surgical History:  ?Procedure Laterality Date  ? ABDOMINAL HYSTERECTOMY    ? APPENDECTOMY    ? CHOLECYSTECTOMY    ? NECK SURGERY    ? ? ?Family History  ?Problem Relation Age of Onset  ? Diabetes Father   ? Heart disease Father   ? Cancer Sister   ?     breast   ? Heart disease Sister   ? Heart attack Brother   ? Heart attack Brother   ? Heart attack Brother   ? ? ?Social History  ? ?Socioeconomic History  ? Marital status: Married  ?  Spouse name: Not on file  ? Number of children: Not on file  ? Years of education: Not on file  ? Highest education level: Not on file  ?Occupational History  ? Not on file  ?Tobacco Use  ? Smoking status: Never  ? Smokeless tobacco: Never  ?Vaping Use  ? Vaping Use: Never used  ?Substance and Sexual Activity  ? Alcohol use: No  ? Drug use: No  ? Sexual activity: Not Currently  ?Other Topics Concern  ? Not on file  ?Social History Narrative  ? Not on file  ? ?Social Determinants of Health  ? ?Financial Resource Strain: Low Risk   ? Difficulty of Paying Living Expenses: Not hard at all  ?Food Insecurity: No Food Insecurity   ? Worried About Charity fundraiser in the Last Year: Never true  ? Ran Out of Food in the Last Year: Never true  ?Transportation Needs: No Transportation Needs  ? Lack of Transportation (Medical): No  ? Lack of Transportation (Non-Medical): No  ?Physical Activity: Inactive  ? Days of Exercise per Week: 0 days  ? Minutes of Exercise per Session: 0 min  ?Stress: No Stress Concern Present  ? Feeling of Stress : Not at all  ?Social Connections: Moderately Isolated  ? Frequency of Communication with Friends and Family: Twice a week  ? Frequency of Social Gatherings with Friends and Family: Twice a week  ? Attends Religious Services: More than 4 times per year  ? Active Member of Clubs or Organizations: No  ? Attends Archivist Meetings: Never  ? Marital Status: Widowed  ?Intimate Partner Violence: Not At Risk  ? Fear of Current or Ex-Partner: No  ? Emotionally Abused: No  ? Physically Abused: No  ? Sexually Abused: No  ? ? ?Outpatient Medications Prior to Visit  ?Medication Sig Dispense Refill  ? acetaminophen (TYLENOL) 650 MG CR tablet Take 650 mg by mouth every 8 (eight) hours as needed for pain.    ? benazepril (LOTENSIN) 5  MG tablet Take 10 mg by mouth daily.    ? Echinacea-Goldenseal (IMMUNE HEALTH BLEND PO) Take 500 mg by mouth daily.    ? levothyroxine (SYNTHROID) 75 MCG tablet TAKE 1 TABLET BY MOUTH EVERY DAY 90 tablet 3  ? methotrexate (RHEUMATREX) 2.5 MG tablet Take 6 tablets (15 mg total) by mouth once a week. Caution:Chemotherapy. Protect from light. 24 tablet 4  ? Multiple Minerals-Vitamins (CALCIUM CITRATE PLUS/MAGNESIUM) TABS Take 1 tablet by mouth daily.    ? Polyethyl Glycol-Propyl Glycol (SYSTANE OP) Apply to eye daily.    ? pravastatin (PRAVACHOL) 20 MG tablet Take 1 tablet (20 mg total) by mouth every evening. 90 tablet 3  ? leflunomide (ARAVA) 20 MG tablet TAKE 1 TABLET BY MOUTH EVERY DAY 90 tablet 0  ? Multiple Vitamins-Minerals (MULTIVITAMIN WITH MINERALS) tablet Take 1 tablet by mouth  daily. (Patient not taking: Reported on 10/21/2021)    ? ?No facility-administered medications prior to visit.  ? ? ?Allergies  ?Allergen Reactions  ? Tramadol Nausea Only  ? Codeine Phosphate Nausea Only  ?  REACTION: unspecified  ? ? ?ROS ?Review of Systems  ?Constitutional:  Negative for diaphoresis, fatigue, fever and unexpected weight change.  ?HENT: Negative.    ?Eyes:  Negative for photophobia and visual disturbance.  ?Respiratory: Negative.    ?Cardiovascular: Negative.   ?Gastrointestinal: Negative.   ?Genitourinary: Negative.   ? ?  ?Objective:  ?  ?Physical Exam ?Vitals and nursing note reviewed.  ?Constitutional:   ?   General: She is not in acute distress. ?   Appearance: Normal appearance. She is not ill-appearing, toxic-appearing or diaphoretic.  ?HENT:  ?   Head: Normocephalic and atraumatic.  ?   Right Ear: Tympanic membrane, ear canal and external ear normal.  ?   Left Ear: Tympanic membrane, ear canal and external ear normal.  ?Eyes:  ?   General: No scleral icterus.    ?   Right eye: No discharge.     ?   Left eye: No discharge.  ?   Extraocular Movements: Extraocular movements intact.  ?   Conjunctiva/sclera: Conjunctivae normal.  ?Cardiovascular:  ?   Rate and Rhythm: Normal rate and regular rhythm.  ?Pulmonary:  ?   Effort: Pulmonary effort is normal.  ?   Breath sounds: Normal breath sounds.  ?Musculoskeletal:  ?   Cervical back: No rigidity or tenderness.  ?Lymphadenopathy:  ?   Cervical: No cervical adenopathy.  ?Skin: ?   General: Skin is warm and dry.  ?Neurological:  ?   Mental Status: She is alert and oriented to person, place, and time.  ?Psychiatric:     ?   Mood and Affect: Mood normal.     ?   Behavior: Behavior normal.  ? ? ?BP 118/68 (BP Location: Right Arm, Patient Position: Sitting, Cuff Size: Normal)   Pulse 96   Temp (!) 97.1 ?F (36.2 ?C) (Temporal)   Ht '5\' 2"'  (1.575 m)   Wt 113 lb 6.4 oz (51.4 kg)   SpO2 97%   BMI 20.74 kg/m?  ?Wt Readings from Last 3 Encounters:   ?10/21/21 113 lb 6.4 oz (51.4 kg)  ?09/16/21 119 lb 12.8 oz (54.3 kg)  ?09/12/21 115 lb 12.8 oz (52.5 kg)  ? ? ? ?Health Maintenance Due  ?Topic Date Due  ? MAMMOGRAM  07/23/2016  ? ? ?There are no preventive care reminders to display for this patient. ? ?Lab Results  ?Component Value Date  ? TSH 2.14 04/18/2021  ? ?  Lab Results  ?Component Value Date  ? WBC 7.0 09/12/2021  ? HGB 11.9 (L) 09/12/2021  ? HCT 38.6 09/12/2021  ? MCV 81.8 09/12/2021  ? PLT 321 09/12/2021  ? ?Lab Results  ?Component Value Date  ? NA 140 09/12/2021  ? K 5.0 09/12/2021  ? CO2 28 09/12/2021  ? GLUCOSE 136 (H) 09/12/2021  ? BUN 23 09/12/2021  ? CREATININE 1.27 (H) 09/12/2021  ? BILITOT 0.4 09/12/2021  ? ALKPHOS 80 09/12/2021  ? AST 18 09/12/2021  ? ALT 8 09/12/2021  ? PROT 7.9 09/12/2021  ? ALBUMIN 4.0 09/12/2021  ? CALCIUM 10.4 (H) 09/12/2021  ? ANIONGAP 10 09/12/2021  ? EGFR 56 (L) 05/20/2021  ? GFR 40.02 (L) 04/18/2021  ? ?Lab Results  ?Component Value Date  ? CHOL 226 (H) 04/18/2021  ? ?Lab Results  ?Component Value Date  ? HDL 47.70 04/18/2021  ? ?Lab Results  ?Component Value Date  ? LDLCALC 156 (H) 02/03/2019  ? ?Lab Results  ?Component Value Date  ? TRIG 226.0 (H) 04/18/2021  ? ?Lab Results  ?Component Value Date  ? CHOLHDL 5 04/18/2021  ? ?Lab Results  ?Component Value Date  ? HGBA1C 5.7 04/18/2021  ? ? ?  ?Assessment & Plan:  ? ?Problem List Items Addressed This Visit   ? ?  ? Endocrine  ? Hypothyroidism  ?  ? Genitourinary  ? Chronic kidney disease (CKD) stage G3b  ? Relevant Medications  ? sitaGLIPtin (JANUVIA) 25 MG tablet  ?  ? Other  ? History of congestive heart failure  ? Relevant Medications  ? sitaGLIPtin (JANUVIA) 25 MG tablet  ? Elevated LDL cholesterol level - Primary  ? ? ?Meds ordered this encounter  ?Medications  ? sitaGLIPtin (JANUVIA) 25 MG tablet  ?  Sig: Take 1 tablet (25 mg total) by mouth daily.  ?  Dispense:  30 tablet  ?  Refill:  2  ? ? ?Follow-up: Return in about 3 months (around 01/21/2022).  ?We will start  Januvia.  Patient was given information about it.  Encouraged hydration.  I discussed this medicine with patient and her son.  Discussed ways of increasing nutritional intake.  She is already using Ensure.  Discus

## 2021-10-31 ENCOUNTER — Other Ambulatory Visit: Payer: Self-pay | Admitting: Cardiology

## 2021-11-07 DIAGNOSIS — M069 Rheumatoid arthritis, unspecified: Secondary | ICD-10-CM | POA: Diagnosis not present

## 2021-11-07 DIAGNOSIS — H35373 Puckering of macula, bilateral: Secondary | ICD-10-CM | POA: Diagnosis not present

## 2021-11-07 DIAGNOSIS — H04123 Dry eye syndrome of bilateral lacrimal glands: Secondary | ICD-10-CM | POA: Diagnosis not present

## 2021-11-07 DIAGNOSIS — H3554 Dystrophies primarily involving the retinal pigment epithelium: Secondary | ICD-10-CM | POA: Diagnosis not present

## 2021-11-12 ENCOUNTER — Other Ambulatory Visit: Payer: Self-pay

## 2021-11-12 ENCOUNTER — Inpatient Hospital Stay: Payer: Medicare HMO | Admitting: Hematology & Oncology

## 2021-11-12 ENCOUNTER — Encounter: Payer: Self-pay | Admitting: Hematology & Oncology

## 2021-11-12 ENCOUNTER — Inpatient Hospital Stay: Payer: Medicare HMO | Attending: Hematology & Oncology

## 2021-11-12 VITALS — BP 125/50 | HR 87 | Temp 97.9°F | Resp 18 | Ht 62.0 in | Wt 117.1 lb

## 2021-11-12 DIAGNOSIS — Z79899 Other long term (current) drug therapy: Secondary | ICD-10-CM | POA: Diagnosis not present

## 2021-11-12 DIAGNOSIS — C91Z0 Other lymphoid leukemia not having achieved remission: Secondary | ICD-10-CM | POA: Diagnosis not present

## 2021-11-12 DIAGNOSIS — M069 Rheumatoid arthritis, unspecified: Secondary | ICD-10-CM | POA: Insufficient documentation

## 2021-11-12 LAB — CBC WITH DIFFERENTIAL (CANCER CENTER ONLY)
Abs Immature Granulocytes: 0.01 10*3/uL (ref 0.00–0.07)
Basophils Absolute: 0.1 10*3/uL (ref 0.0–0.1)
Basophils Relative: 1 %
Eosinophils Absolute: 0.1 10*3/uL (ref 0.0–0.5)
Eosinophils Relative: 1 %
HCT: 33.7 % — ABNORMAL LOW (ref 36.0–46.0)
Hemoglobin: 10.2 g/dL — ABNORMAL LOW (ref 12.0–15.0)
Immature Granulocytes: 0 %
Lymphocytes Relative: 67 %
Lymphs Abs: 5.1 10*3/uL — ABNORMAL HIGH (ref 0.7–4.0)
MCH: 26 pg (ref 26.0–34.0)
MCHC: 30.3 g/dL (ref 30.0–36.0)
MCV: 86 fL (ref 80.0–100.0)
Monocytes Absolute: 0.9 10*3/uL (ref 0.1–1.0)
Monocytes Relative: 12 %
Neutro Abs: 1.4 10*3/uL — ABNORMAL LOW (ref 1.7–7.7)
Neutrophils Relative %: 19 %
Platelet Count: 274 10*3/uL (ref 150–400)
RBC: 3.92 MIL/uL (ref 3.87–5.11)
RDW: 17.6 % — ABNORMAL HIGH (ref 11.5–15.5)
WBC Count: 7.6 10*3/uL (ref 4.0–10.5)
nRBC: 0 % (ref 0.0–0.2)

## 2021-11-12 LAB — SAVE SMEAR(SSMR), FOR PROVIDER SLIDE REVIEW

## 2021-11-12 LAB — CMP (CANCER CENTER ONLY)
ALT: 10 U/L (ref 0–44)
AST: 19 U/L (ref 15–41)
Albumin: 3.9 g/dL (ref 3.5–5.0)
Alkaline Phosphatase: 61 U/L (ref 38–126)
Anion gap: 7 (ref 5–15)
BUN: 20 mg/dL (ref 8–23)
CO2: 26 mmol/L (ref 22–32)
Calcium: 9.9 mg/dL (ref 8.9–10.3)
Chloride: 108 mmol/L (ref 98–111)
Creatinine: 0.96 mg/dL (ref 0.44–1.00)
GFR, Estimated: 58 mL/min — ABNORMAL LOW (ref >60)
Glucose, Bld: 90 mg/dL (ref 70–99)
Potassium: 4.4 mmol/L (ref 3.5–5.1)
Sodium: 141 mmol/L (ref 135–145)
Total Bilirubin: 0.4 mg/dL (ref 0.3–1.2)
Total Protein: 7 g/dL (ref 6.5–8.1)

## 2021-11-12 LAB — LACTATE DEHYDROGENASE: LDH: 182 U/L (ref 98–192)

## 2021-11-12 NOTE — Progress Notes (Signed)
?Hematology and Oncology Follow Up Visit ? ?[name] [name] ?[id number] ?1935-02-28 87 y.o. ?11/12/2021 ? ? ?Principle Diagnosis:  ?Large granular lymphocytic leukemia ?Rheumatoid arthritis ? ?Current Therapy:   ?Methotrexate 15 mg p.o. weekly- on 07/27/2021 ?    ?Interim History:  [name] [name] is back for follow-up.  So far, she is doing pretty well.  I do not think she is having much toxicity from the methotrexate.  She takes it weekly.  She is not having any nausea or vomiting.  She does not eat much.  She just does not feel all that hungry.  Her weight is holding relatively stable from when we last saw her. ? ?She has had no rashes.  She has had no change in bowel or bladder habits.  She has had no cough or shortness of breath..  She has little bit of leg swelling which is chronic. ? ?She has had no issues with COVID. ? ?There is been no headache. ? ?So far, the rheumatoid arthritis seems to be doing pretty well. ? ?Overall, her performance status is ECOG 2.   ? ?Medications:  ?Current Outpatient Medications:  ?  acetaminophen (TYLENOL) 650 MG CR tablet, Take 650 mg by mouth every 8 (eight) hours as needed for pain., Disp: , Rfl:  ?  benazepril (LOTENSIN) 5 MG tablet, Take 10 mg by mouth daily., Disp: , Rfl:  ?  Echinacea-Goldenseal (IMMUNE HEALTH BLEND PO), Take 500 mg by mouth daily., Disp: , Rfl:  ?  leflunomide (ARAVA) 20 MG tablet, TAKE 1 TABLET BY MOUTH EVERY DAY, Disp: 90 tablet, Rfl: 0 ?  levothyroxine (SYNTHROID) 75 MCG tablet, TAKE 1 TABLET BY MOUTH EVERY DAY, Disp: 90 tablet, Rfl: 3 ?  methotrexate (RHEUMATREX) 2.5 MG tablet, Take 6 tablets (15 mg total) by mouth once a week. Caution:Chemotherapy. Protect from light., Disp: 24 tablet, Rfl: 4 ?  Multiple Minerals-Vitamins (CALCIUM CITRATE PLUS/MAGNESIUM) TABS, Take 1 tablet by mouth daily., Disp: , Rfl:  ?  Polyethyl Glycol-Propyl Glycol (SYSTANE OP), Apply to eye daily., Disp: , Rfl:  ?  pravastatin (PRAVACHOL) 20 MG tablet, Take 1 tablet (20 mg total)  by mouth daily., Disp: 90 tablet, Rfl: 0 ?  sitaGLIPtin (JANUVIA) 25 MG tablet, Take 1 tablet (25 mg total) by mouth daily., Disp: 30 tablet, Rfl: 2 ? ?Allergies:  ?Allergies  ?Allergen Reactions  ? Tramadol Nausea Only  ? Codeine Phosphate Nausea Only  ?  REACTION: unspecified  ? ? ?Past Medical History, Surgical history, Social history, and Family History were reviewed and updated. ? ?Review of Systems: ?Review of Systems  ?Constitutional:  Positive for fatigue.  ?HENT:  Negative.    ?Eyes: Negative.   ?Respiratory: Negative.    ?Cardiovascular: Negative.   ?Gastrointestinal: Negative.   ?Endocrine: Negative.   ?Genitourinary: Negative.    ?Musculoskeletal:  Positive for arthralgias and myalgias.  ?Skin: Negative.   ?Neurological: Negative.   ?Hematological: Negative.   ?Psychiatric/Behavioral: Negative.    ? ?Physical Exam: ? height is _0  (1.575 m) and weight is 117 lb 1.9 oz (53.1 kg). Her oral temperature is 97.9 ?F (36.6 ?C). Her blood pressure is 125/50 (abnormal) and her pulse is 87. Her respiration is 18 and oxygen saturation is 100%.  ? ?Wt Readings from Last 3 Encounters:  ?11/12/21 117 lb 1.9 oz (53.1 kg)  ?10/21/21 113 lb 6.4 oz (51.4 kg)  ?09/16/21 119 lb 12.8 oz (54.3 kg)  ? ? ?Physical Exam ?Vitals reviewed.  ?HENT:  ?   Head: Normocephalic  and atraumatic.  ?Eyes:  ?   Pupils: Pupils are equal, round, and reactive to light.  ?Cardiovascular:  ?   Rate and Rhythm: Normal rate and regular rhythm.  ?   Heart sounds: Normal heart sounds.  ?Pulmonary:  ?   Effort: Pulmonary effort is normal.  ?   Breath sounds: Normal breath sounds.  ?Abdominal:  ?   General: Bowel sounds are normal.  ?   Palpations: Abdomen is soft.  ?Musculoskeletal:     ?   General: No tenderness or deformity. Normal range of motion.  ?   Cervical back: Normal range of motion.  ?Lymphadenopathy:  ?   Cervical: No cervical adenopathy.  ?Skin: ?   General: Skin is warm and dry.  ?   Findings: No erythema or rash.  ?Neurological:  ?    Mental Status: She is alert and oriented to person, place, and time.  ?Psychiatric:     ?   Behavior: Behavior normal.     ?   Thought Content: Thought content normal.     ?   Judgment: Judgment normal.  ? ? ? ?Lab Results  ?Component Value Date  ? WBC 7.6 11/12/2021  ? HGB 10.2 (L) 11/12/2021  ? HCT 33.7 (L) 11/12/2021  ? MCV 86.0 11/12/2021  ? PLT 274 11/12/2021  ? ?  Chemistry   ?   ?Component Value Date/Time  ? NA 141 11/12/2021 0957  ? NA 141 02/03/2019 1129  ? K 4.4 11/12/2021 0957  ? CL 108 11/12/2021 0957  ? CO2 26 11/12/2021 0957  ? BUN 20 11/12/2021 0957  ? BUN 31 (H) 02/03/2019 1129  ? CREATININE 0.96 11/12/2021 0957  ? CREATININE 1.00 (H) 05/20/2021 1545  ?    ?Component Value Date/Time  ? CALCIUM 9.9 11/12/2021 0957  ? ALKPHOS 61 11/12/2021 0957  ? AST 19 11/12/2021 0957  ? ALT 10 11/12/2021 0957  ? BILITOT 0.4 11/12/2021 0957  ?  ? ? ?Impression and Plan: ?[name] [name] is a very charming 87 year old white female.  She has rheumatoid arthritis.  She has marked lymphocytosis with neutropenia.  As such, she had a bone marrow biopsy done.  The results of the bone marrow biopsy are very consistent  with large granular lymphocytic leukemia. ? ?The methotrexate is keeping her white cell count up enough.  I do not think the white cell count ever will get to be "normal.  However, I think where the [place] is is certainly reasonable and should help her immune system. ? ?We will try to move her appointments out a little bit longer now.  I will try to get her back now in 3 months. ? ?Her [birth year] is in [birth year].  I am sure that she will have a wonderful 87th birthday.   ? ?Volanda Napoleon, MD ?4/11/202311:00 AM  ?

## 2021-11-19 ENCOUNTER — Other Ambulatory Visit: Payer: Self-pay | Admitting: Family Medicine

## 2021-11-19 DIAGNOSIS — N1832 Chronic kidney disease, stage 3b: Secondary | ICD-10-CM

## 2021-11-19 DIAGNOSIS — I10 Essential (primary) hypertension: Secondary | ICD-10-CM

## 2021-11-30 ENCOUNTER — Other Ambulatory Visit: Payer: Self-pay | Admitting: Physician Assistant

## 2021-12-02 NOTE — Telephone Encounter (Signed)
Next Visit: 02/14/2022 ? ?Last Visit: 09/16/2021 ? ?Last Fill: 09/02/2021  ? ?DX: Rheumatoid arthritis involving multiple sites with positive rheumatoid factor  ? ?Current Dose per office note 09/16/2021: Arava 20 mg 1 tablet by mouth daily ? ?Labs: 11/12/2021 GFR 58, Hgb 10.2, Hct 33.7, RDW 17.6, Neutro Abs 1.4, Lymphs Abs. 5.1 ? ?Okay to refill Arava?  ?

## 2022-01-15 ENCOUNTER — Other Ambulatory Visit: Payer: Self-pay | Admitting: Family Medicine

## 2022-01-15 DIAGNOSIS — N1832 Chronic kidney disease, stage 3b: Secondary | ICD-10-CM

## 2022-01-15 DIAGNOSIS — Z8679 Personal history of other diseases of the circulatory system: Secondary | ICD-10-CM

## 2022-01-18 ENCOUNTER — Other Ambulatory Visit: Payer: Self-pay | Admitting: Family Medicine

## 2022-01-18 DIAGNOSIS — E039 Hypothyroidism, unspecified: Secondary | ICD-10-CM

## 2022-01-21 ENCOUNTER — Encounter: Payer: Self-pay | Admitting: Family Medicine

## 2022-01-21 ENCOUNTER — Ambulatory Visit (INDEPENDENT_AMBULATORY_CARE_PROVIDER_SITE_OTHER): Payer: Medicare HMO | Admitting: Family Medicine

## 2022-01-21 VITALS — BP 132/60 | HR 91 | Temp 96.1°F | Ht 62.0 in | Wt 115.0 lb

## 2022-01-21 DIAGNOSIS — R7303 Prediabetes: Secondary | ICD-10-CM

## 2022-01-21 DIAGNOSIS — E039 Hypothyroidism, unspecified: Secondary | ICD-10-CM | POA: Diagnosis not present

## 2022-01-21 DIAGNOSIS — E7800 Pure hypercholesterolemia, unspecified: Secondary | ICD-10-CM | POA: Diagnosis not present

## 2022-01-21 LAB — LIPID PANEL
Cholesterol: 212 mg/dL — ABNORMAL HIGH (ref 0–200)
HDL: 43.4 mg/dL (ref >39.00)
NonHDL: 168.63
Total CHOL/HDL Ratio: 5
Triglycerides: 216 mg/dL — ABNORMAL HIGH (ref 0.0–149.0)
VLDL: 43.2 mg/dL — ABNORMAL HIGH (ref 0.0–40.0)

## 2022-01-21 LAB — LDL CHOLESTEROL, DIRECT: Direct LDL: 122 mg/dL

## 2022-01-21 LAB — TSH: TSH: 1.14 u[IU]/mL (ref 0.35–5.50)

## 2022-01-21 LAB — HEMOGLOBIN A1C: Hgb A1c MFr Bld: 5.3 % (ref 4.6–6.5)

## 2022-01-21 NOTE — Progress Notes (Signed)
Established Patient Office Visit  Subjective   Patient ID: [name] [name], female    DOB: 1934-10-07  Age: 87 y.o. MRN: [id number]  Chief Complaint  Patient presents with   Follow-up    3 mo f/u. No questions/ concerns. Pt fasting.    HPI follow-up of mixed hyperlipidemia, prediabetes and hypothyroidism.  Continues with pravastatin, Sitagliptin and levothyroxine without issue.  Appetite is been slightly diminished.  She is not eating once daily and drinking Ensure on occasion.  Recent blood work through oncology reviewed.  Energy levels are [place].    Review of Systems  Constitutional:  Negative for chills, diaphoresis, malaise/fatigue and weight loss.  HENT: Negative.    Eyes: Negative.  Negative for blurred vision and double vision.  Cardiovascular:  Negative for chest pain.  Gastrointestinal:  Negative for abdominal pain.  Genitourinary: Negative.   Musculoskeletal:  Negative for falls and myalgias.  Neurological:  Negative for speech change, loss of consciousness and weakness.  Psychiatric/Behavioral: Negative.        Objective:     BP 132/60 (BP Location: Right Arm, Patient Position: Sitting, Cuff Size: Normal)   Pulse 91   Temp (!) 96.1 F (35.6 C) (Temporal)   Ht '5\' 2"'$  (1.575 m)   Wt 115 lb (52.2 kg)   SpO2 96%   BMI 21.03 kg/m  Wt Readings from Last 3 Encounters:  01/21/22 115 lb (52.2 kg)  11/12/21 117 lb 1.9 oz (53.1 kg)  10/21/21 113 lb 6.4 oz (51.4 kg)      Physical Exam Constitutional:      General: She is not in acute distress.    Appearance: Normal appearance. She is not ill-appearing, toxic-appearing or diaphoretic.  HENT:     Head: Normocephalic and atraumatic.     Right Ear: External ear normal.     Left Ear: External ear normal.     Mouth/Throat:     Mouth: Mucous membranes are moist.     Pharynx: Oropharynx is clear. No oropharyngeal exudate or posterior oropharyngeal erythema.  Eyes:     General: No scleral icterus.       Right eye:  No discharge.        Left eye: No discharge.     Extraocular Movements: Extraocular movements intact.     Conjunctiva/sclera: Conjunctivae normal.     Pupils: Pupils are equal, round, and reactive to light.  Cardiovascular:     Rate and Rhythm: Normal rate and regular rhythm.  Pulmonary:     Effort: Pulmonary effort is normal. No respiratory distress.     Breath sounds: Normal breath sounds.  Musculoskeletal:     Cervical back: No rigidity or tenderness.  Skin:    General: Skin is warm and dry.  Neurological:     Mental Status: She is alert and oriented to person, place, and time.  Psychiatric:        Mood and Affect: Mood normal.        Behavior: Behavior normal.      No results found for any visits on 01/21/22.    The ASCVD Risk score (Arnett DK, et al., 2019) failed to calculate for the following reasons:   The 2019 ASCVD risk score is only valid for ages 23 to 58    Assessment & Plan:   Problem List Items Addressed This Visit       Endocrine   Hypothyroidism   Relevant Orders   TSH     Other   Elevated LDL  cholesterol level - Primary   Relevant Orders   Lipid panel   Other Visit Diagnoses     Prediabetes       Relevant Orders   Hemoglobin A1c       Return in about 3 months (around 04/23/2022).  Thank encouraged increased activity as tolerated.  May consider change to SGLT2 inhibitor.  Has follow-up scheduled with oncology next week.  Libby Maw, MD

## 2022-01-29 ENCOUNTER — Other Ambulatory Visit: Payer: Self-pay | Admitting: Cardiology

## 2022-01-29 NOTE — Telephone Encounter (Signed)
Rx refill sent to pharmacy. 

## 2022-01-30 ENCOUNTER — Telehealth: Payer: Self-pay | Admitting: *Deleted

## 2022-01-30 ENCOUNTER — Inpatient Hospital Stay: Payer: Medicare HMO | Attending: Hematology & Oncology

## 2022-01-30 ENCOUNTER — Other Ambulatory Visit: Payer: Self-pay

## 2022-01-30 ENCOUNTER — Inpatient Hospital Stay: Payer: Medicare HMO | Admitting: Hematology & Oncology

## 2022-01-30 ENCOUNTER — Encounter: Payer: Self-pay | Admitting: Hematology & Oncology

## 2022-01-30 VITALS — BP 126/74 | HR 90 | Temp 97.9°F | Resp 18 | Ht 62.0 in | Wt 117.0 lb

## 2022-01-30 DIAGNOSIS — M069 Rheumatoid arthritis, unspecified: Secondary | ICD-10-CM | POA: Diagnosis not present

## 2022-01-30 DIAGNOSIS — C91Z0 Other lymphoid leukemia not having achieved remission: Secondary | ICD-10-CM | POA: Diagnosis not present

## 2022-01-30 DIAGNOSIS — D709 Neutropenia, unspecified: Secondary | ICD-10-CM | POA: Insufficient documentation

## 2022-01-30 LAB — CMP (CANCER CENTER ONLY)
ALT: 19 U/L (ref 0–44)
AST: 25 U/L (ref 15–41)
Albumin: 3.7 g/dL (ref 3.5–5.0)
Alkaline Phosphatase: 74 U/L (ref 38–126)
Anion gap: 7 (ref 5–15)
BUN: 19 mg/dL (ref 8–23)
CO2: 24 mmol/L (ref 22–32)
Calcium: 9.2 mg/dL (ref 8.9–10.3)
Chloride: 106 mmol/L (ref 98–111)
Creatinine: 1 mg/dL (ref 0.44–1.00)
GFR, Estimated: 55 mL/min — ABNORMAL LOW (ref >60)
Glucose, Bld: 124 mg/dL — ABNORMAL HIGH (ref 70–99)
Potassium: 4.1 mmol/L (ref 3.5–5.1)
Sodium: 137 mmol/L (ref 135–145)
Total Bilirubin: 0.4 mg/dL (ref 0.3–1.2)
Total Protein: 7.5 g/dL (ref 6.5–8.1)

## 2022-01-30 LAB — CBC WITH DIFFERENTIAL (CANCER CENTER ONLY)
Abs Immature Granulocytes: 0.01 10*3/uL (ref 0.00–0.07)
Basophils Absolute: 0.1 10*3/uL (ref 0.0–0.1)
Basophils Relative: 1 %
Eosinophils Absolute: 0.1 10*3/uL (ref 0.0–0.5)
Eosinophils Relative: 1 %
HCT: 37.5 % (ref 36.0–46.0)
Hemoglobin: 11.3 g/dL — ABNORMAL LOW (ref 12.0–15.0)
Immature Granulocytes: 0 %
Lymphocytes Relative: 81 %
Lymphs Abs: 9.2 10*3/uL — ABNORMAL HIGH (ref 0.7–4.0)
MCH: 25.9 pg — ABNORMAL LOW (ref 26.0–34.0)
MCHC: 30.1 g/dL (ref 30.0–36.0)
MCV: 85.8 fL (ref 80.0–100.0)
Monocytes Absolute: 1 10*3/uL (ref 0.1–1.0)
Monocytes Relative: 9 %
Neutro Abs: 0.8 10*3/uL — ABNORMAL LOW (ref 1.7–7.7)
Neutrophils Relative %: 8 %
Platelet Count: 154 10*3/uL (ref 150–400)
RBC: 4.37 MIL/uL (ref 3.87–5.11)
RDW: 14 % (ref 11.5–15.5)
Smear Review: NORMAL
WBC Count: 11.2 10*3/uL — ABNORMAL HIGH (ref 4.0–10.5)
nRBC: 0 % (ref 0.0–0.2)

## 2022-01-30 LAB — LACTATE DEHYDROGENASE: LDH: 220 U/L — ABNORMAL HIGH (ref 98–192)

## 2022-01-30 LAB — SAVE SMEAR(SSMR), FOR PROVIDER SLIDE REVIEW

## 2022-01-30 NOTE — Telephone Encounter (Signed)
Per 01/30/22 los gave upcoming appointments - confirmed
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
## 2022-02-05 NOTE — Progress Notes (Signed)
Office Visit Note  Patient: [name] [name]             Date of Birth: 1935/01/15           MRN: [id number]             PCP: Libby Maw, MD Referring: Libby Maw,* Visit Date: 02/14/2022 Occupation: '@GUAROCC'$ @  Subjective:  Medication management  History of Present Illness: [name] [name] is a 87 y.o. female with history of seropositive rheumatoid arthritis and osteoarthritis.  She has been doing well on leflunomide 20 mg p.o. daily.  She denies any joint pain or joint swelling.  She states she was placed on methotrexate 6 tablets p.o. weekly in December 2022 by Dr. Marin Olp for LGL leukemia.  She has been tolerating both medications well.  She denies any joint pain today.  Activities of Daily Living:  Patient reports morning stiffness for 0 minutes.   Patient Denies nocturnal pain.  Difficulty dressing/grooming: Denies Difficulty climbing stairs: Denies Difficulty getting out of chair: Denies Difficulty using hands for taps, buttons, cutlery, and/or writing: Denies  Review of Systems  Constitutional:  Negative for fatigue.  HENT:  Negative for mouth sores and mouth dryness.   Eyes:  Positive for dryness.  Respiratory:  Negative for shortness of breath.   Cardiovascular:  Negative for chest pain and palpitations.  Gastrointestinal:  Negative for blood in stool, constipation and diarrhea.  Endocrine: Negative for increased urination.  Genitourinary:  Negative for involuntary urination.  Musculoskeletal:  Negative for joint pain, joint pain, joint swelling, myalgias, muscle weakness, morning stiffness, muscle tenderness and myalgias.  Skin:  Negative for color change, rash, hair loss and sensitivity to sunlight.  Allergic/Immunologic: Negative for susceptible to infections.  Neurological:  Negative for dizziness and headaches.  Hematological:  Negative for swollen glands.  Psychiatric/Behavioral:  Negative for depressed mood and sleep disturbance. The  patient is not nervous/anxious.     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   Goals of care, counseling/discussion 08/06/2021   Need for influenza vaccination 04/18/2021   Iron deficiency anemia 11/20/2020   Lymphocytosis 10/16/2020   Neutropenia ([place]) 10/16/2020   Paroxysmal supraventricular tachycardia (HCC) 10/15/2020   Elevated LDL cholesterol level 01/12/2020   DDD (degenerative disc disease), cervical 07/13/2018   Chronic kidney disease (CKD) stage G3b 01/21/2017   History of hypothyroidism 01/20/2017   History of congestive heart failure 08/26/2016   Osteopenia of multiple sites 08/25/2016   DDD cervical spine 08/25/2016   Primary osteoarthritis of both hands 08/25/2016   High risk medication use 06/11/2016   CHF, acute ([place]) 02/01/2013   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   Rheumatoid arthritis(714.0) 01/19/2007    Family History  Problem Relation Age of Onset   Diabetes Father    Heart disease Father    Cancer Sister        breast    Heart disease Sister    Heart attack Brother    Heart attack Brother    Heart attack Brother    Past Surgical History:  Procedure Laterality Date   ABDOMINAL HYSTERECTOMY  APPENDECTOMY     CHOLECYSTECTOMY     NECK SURGERY     Social History   Social History Narrative   Not on file   Immunization History  Administered Date(s) Administered   Fluad Quad(high Dose 65+) 05/06/2019, 04/18/2021   Influenza,inj,Quad PF,6+ Mos 07/06/2013   Pneumococcal Conjugate-13 07/06/2013   Pneumococcal  Polysaccharide-23 04/09/2011, 06/21/2012   Tdap 04/09/2011     Objective: Vital Signs: BP 120/65 (BP Location: Left Arm, Patient Position: Sitting, Cuff Size: Normal)   Pulse 79   Ht 5' 1.5" (1.562 m)   Wt 116 lb 3.2 oz (52.7 kg)   BMI 21.60 kg/m    Physical Exam Vitals and nursing note reviewed.  Constitutional:      Appearance: She is well-developed.  HENT:     Head: Normocephalic and atraumatic.  Eyes:     Conjunctiva/sclera: Conjunctivae normal.  Cardiovascular:     Rate and Rhythm: Normal rate and regular rhythm.     Heart sounds: Normal heart sounds.  Pulmonary:     Effort: Pulmonary effort is normal.     Breath sounds: Normal breath sounds.  Abdominal:     General: Bowel sounds are normal.     Palpations: Abdomen is soft.  Musculoskeletal:     Cervical back: Normal range of motion.  Lymphadenopathy:     Cervical: No cervical adenopathy.  Skin:    General: Skin is warm and dry.     Capillary Refill: Capillary refill takes less than 2 seconds.  Neurological:     Mental Status: She is alert and oriented to person, place, and time.  Psychiatric:        Behavior: Behavior normal.      Musculoskeletal Exam: C-spine was in good range of motion.  Shoulder joints, elbow joints, wrist joints with good range of motion.  She had no wrist joint, MCPs PIP and DIP thickening.  No synovitis was noted.  Hip joints and knee joints in good range of motion.  She had no tenderness over ankles or MTPs.  CDAI Exam: CDAI Score: 0  Patient Global: 0 mm; Provider Global: 0 mm Swollen: 0 ; Tender: 0  Joint Exam 02/14/2022   No joint exam has been documented for this visit   There is currently no information documented on the homunculus. Go to the Rheumatology activity and complete the homunculus joint exam.  Investigation: No additional findings.  Imaging: No results found.  Recent Labs: Lab Results  Component Value Date   WBC 11.2 (H) 01/30/2022   HGB 11.3 (L) 01/30/2022    PLT 154 01/30/2022   NA 137 01/30/2022   K 4.1 01/30/2022   CL 106 01/30/2022   CO2 24 01/30/2022   GLUCOSE 124 (H) 01/30/2022   BUN 19 01/30/2022   CREATININE 1.00 01/30/2022   BILITOT 0.4 01/30/2022   ALKPHOS 74 01/30/2022   AST 25 01/30/2022   ALT 19 01/30/2022   PROT 7.5 01/30/2022   ALBUMIN 3.7 01/30/2022   CALCIUM 9.2 01/30/2022   GFRAA 50 (L) 08/09/2020    Speciality Comments: PLQ Eye Exam: 10/04/2019 @ [facility]  Prior therapy: Methotrexate (lung injury) NOT CURRENTLY ON PLQ  Procedures:  No procedures performed Allergies: Tramadol and Codeine phosphate   Assessment / Plan:     Visit Diagnoses: Rheumatoid arthritis involving multiple sites with positive rheumatoid factor (HCC)-patient has been doing well on Areva 20 mg p.o. daily without any synovitis.  She was a started on methotrexate 6 tablets p.o. weekly in  December 2022 by Dr. Marin Olp for LGL leukemia.  I had a detailed discussion with the patient and her son that methotrexate should be sufficient to control her rheumatoid arthritis.  She was previously on methotrexate which was discontinued due to decrease in GFR.  I reviewed her last labs and there is no further drop in GFR on the combination therapy.  I discussed decreasing the dose of leflunomide to 20 mg tablet, half a tablet p.o. daily over the next few months.  If she has no flare of rheumatoid arthritis and no worsening of symptoms then we can stop leflunomide and keep her on methotrexate monotherapy.  High risk medication use - Arava 20 mg 1 tablet by mouth daily for rheumatoid arthritis and methotrexate to 6 tablets p.o. weekly for LGL leukemia.  Previous therapy: (Methotrexate d/c due to elevated creatinine) and Plaquenil.  Patient is currently not on folic acid.  Advised patient to discuss this further with Dr. Marin Olp.  Primary osteoarthritis of both hands-she had bilateral PIP and DIP thickening.  No synovitis was noted.  Joint protection was  discussed.  Chronic kidney disease (CKD) stage G3b- her GFR is low and stable in 50s.  Osteopenia of multiple sites-patient has not had a DEXA scan in several years.  We will repeat DEXA scan.  Calcium rich diet and exercise was emphasized.  Large granular lymphocytic leukemia (HCC) - Treated by Dr. Marin Olp.  She was a started on methotrexate 6 tablets p.o. weekly in December 2022.  History of iron deficiency anemia-hemoglobin is low and stable.  Essential hypertension-blood pressure was normal today.  Other medical problems are listed as follows:  History of congestive heart failure  Dyslipidemia  History of hypothyroidism  Orders: Orders Placed This Encounter  Procedures   DG Bone Density   No orders of the defined types were placed in this encounter.    Follow-Up Instructions: Return in about 3 months (around 05/17/2022) for Rheumatoid arthritis.   Bo Merino, MD  Note - This record has been created using Editor, commissioning.  Chart creation errors have been sought, but may not always  have been located. Such creation errors do not reflect on  the standard of medical care.

## 2022-02-13 ENCOUNTER — Ambulatory Visit: Payer: Medicare HMO | Admitting: Rheumatology

## 2022-02-14 ENCOUNTER — Ambulatory Visit: Payer: Medicare HMO | Admitting: Rheumatology

## 2022-02-14 ENCOUNTER — Encounter: Payer: Self-pay | Admitting: Rheumatology

## 2022-02-14 VITALS — BP 120/65 | HR 79 | Ht 61.5 in | Wt 116.2 lb

## 2022-02-14 DIAGNOSIS — N1832 Chronic kidney disease, stage 3b: Secondary | ICD-10-CM

## 2022-02-14 DIAGNOSIS — M19041 Primary osteoarthritis, right hand: Secondary | ICD-10-CM | POA: Diagnosis not present

## 2022-02-14 DIAGNOSIS — Z862 Personal history of diseases of the blood and blood-forming organs and certain disorders involving the immune mechanism: Secondary | ICD-10-CM | POA: Diagnosis not present

## 2022-02-14 DIAGNOSIS — M19042 Primary osteoarthritis, left hand: Secondary | ICD-10-CM

## 2022-02-14 DIAGNOSIS — I10 Essential (primary) hypertension: Secondary | ICD-10-CM

## 2022-02-14 DIAGNOSIS — M0579 Rheumatoid arthritis with rheumatoid factor of multiple sites without organ or systems involvement: Secondary | ICD-10-CM | POA: Diagnosis not present

## 2022-02-14 DIAGNOSIS — Z79899 Other long term (current) drug therapy: Secondary | ICD-10-CM

## 2022-02-14 DIAGNOSIS — C91Z0 Other lymphoid leukemia not having achieved remission: Secondary | ICD-10-CM

## 2022-02-14 DIAGNOSIS — M8589 Other specified disorders of bone density and structure, multiple sites: Secondary | ICD-10-CM | POA: Diagnosis not present

## 2022-02-14 DIAGNOSIS — D72820 Lymphocytosis (symptomatic): Secondary | ICD-10-CM

## 2022-02-14 DIAGNOSIS — Z8679 Personal history of other diseases of the circulatory system: Secondary | ICD-10-CM

## 2022-02-14 DIAGNOSIS — E785 Hyperlipidemia, unspecified: Secondary | ICD-10-CM | POA: Diagnosis not present

## 2022-02-14 DIAGNOSIS — D708 Other neutropenia: Secondary | ICD-10-CM

## 2022-02-14 DIAGNOSIS — Z8639 Personal history of other endocrine, nutritional and metabolic disease: Secondary | ICD-10-CM

## 2022-02-14 NOTE — Patient Instructions (Signed)
Please take leflunomide 20 mg tablet, half tablet daily till your follow-up visit.  Standing Labs We placed an order today for your standing lab work.   Please have your standing labs drawn in September and every 3 months  If possible, please have your labs drawn 2 weeks prior to your appointment so that the provider can discuss your results at your appointment.  Please note that you may see your imaging and lab results in [place] before we have reviewed them. We may be awaiting multiple results to interpret others before contacting you. Please allow our office up to 72 hours to thoroughly review all of the results before contacting the office for clarification of your results.  We have open lab daily: Monday through Thursday from 1:30-4:30 PM and Friday from 1:30-4:00 PM at the office of Dr. Bo Merino, [facility].   Please be advised, all patients with office appointments requiring lab work will take precedent over walk-in lab work.  If possible, please come for your lab work on Monday and Friday afternoons, as you may experience shorter wait times. The office is located at [address], [place], [place], [place] [address] No appointment is necessary.   Labs are drawn by [facility]. Please bring your co-pay at the time of your lab draw.  You may receive a bill from [facility] for your lab work.  Please note if you are on Hydroxychloroquine and and an order has been placed for a Hydroxychloroquine level, you will need to have it drawn 4 hours or more after your last dose.  If you wish to have your labs drawn at another location, please call the office 24 hours in advance to send orders.  If you have any questions regarding directions or hours of operation,  please call [phone].   As a reminder, please drink plenty of water prior to coming for your lab work. Thanks!   Vaccines You are taking a medication(s) that can suppress your immune system.  The following  immunizations are recommended: Flu annually Covid-19  Td/Tdap (tetanus, diphtheria, pertussis) every 10 years Pneumonia (Prevnar 15 then Pneumovax 23 at least 1 year apart.  Alternatively, can take Prevnar 20 without needing additional dose) Shingrix: 2 doses from 4 weeks to 6 months apart  Please check with your PCP to make sure you are up to date.   If you have signs or symptoms of an infection or start antibiotics: First, call your PCP for workup of your infection. Hold your medication through the infection, until you complete your antibiotics, and until symptoms resolve if you take the following: Injectable medication (Actemra, Benlysta, Cimzia, Cosentyx, Enbrel, Humira, Kevzara, Orencia, Remicade, Simponi, Stelara, Taltz, Tremfya) Methotrexate Leflunomide (Arava) Mycophenolate (Cellcept) Morrie Sheldon, Olumiant, or Rinvoq

## 2022-03-02 ENCOUNTER — Other Ambulatory Visit: Payer: Self-pay | Admitting: Physician Assistant

## 2022-03-03 NOTE — Telephone Encounter (Signed)
Next Visit: 05/13/2022  Last Visit: 02/14/2022  Last Fill: 12/02/2021  DX: Rheumatoid arthritis involving multiple sites with positive rheumatoid factor   Current Dose per office note 02/14/2022: Arava 20 mg 1 tablet by mouth daily   Labs: 01/30/2022 WBC 11.2, Hgb 11.3, MCH 25.9, Neutro Abs 0.8, Lymphs Abs 9.2, Glucose 124, GFR 55  Okay to refill Arava?

## 2022-03-25 ENCOUNTER — Other Ambulatory Visit: Payer: Self-pay | Admitting: Family Medicine

## 2022-03-25 ENCOUNTER — Other Ambulatory Visit: Payer: Self-pay | Admitting: Physician Assistant

## 2022-03-25 ENCOUNTER — Other Ambulatory Visit: Payer: Self-pay | Admitting: Cardiology

## 2022-03-25 DIAGNOSIS — N1832 Chronic kidney disease, stage 3b: Secondary | ICD-10-CM

## 2022-03-25 DIAGNOSIS — I10 Essential (primary) hypertension: Secondary | ICD-10-CM

## 2022-03-25 NOTE — Telephone Encounter (Signed)
Pravastatin 20 mg # 90 only with message patient needs appointment for future refills sent to [facility] / [place], [place]

## 2022-04-18 ENCOUNTER — Ambulatory Visit: Payer: Medicare HMO | Admitting: Hematology & Oncology

## 2022-04-18 ENCOUNTER — Other Ambulatory Visit: Payer: Medicare HMO

## 2022-04-20 ENCOUNTER — Other Ambulatory Visit: Payer: Self-pay | Admitting: Family Medicine

## 2022-04-20 DIAGNOSIS — N1832 Chronic kidney disease, stage 3b: Secondary | ICD-10-CM

## 2022-04-20 DIAGNOSIS — Z8679 Personal history of other diseases of the circulatory system: Secondary | ICD-10-CM

## 2022-04-23 ENCOUNTER — Ambulatory Visit (INDEPENDENT_AMBULATORY_CARE_PROVIDER_SITE_OTHER): Payer: Medicare HMO | Admitting: Family Medicine

## 2022-04-23 ENCOUNTER — Encounter: Payer: Self-pay | Admitting: Family Medicine

## 2022-04-23 VITALS — BP 122/64 | HR 87 | Temp 97.1°F | Ht 61.0 in | Wt 119.0 lb

## 2022-04-23 DIAGNOSIS — I10 Essential (primary) hypertension: Secondary | ICD-10-CM | POA: Diagnosis not present

## 2022-04-23 DIAGNOSIS — N1832 Chronic kidney disease, stage 3b: Secondary | ICD-10-CM

## 2022-04-23 DIAGNOSIS — R7303 Prediabetes: Secondary | ICD-10-CM | POA: Insufficient documentation

## 2022-04-23 DIAGNOSIS — Z2911 Encounter for prophylactic immunotherapy for respiratory syncytial virus (RSV): Secondary | ICD-10-CM | POA: Diagnosis not present

## 2022-04-23 DIAGNOSIS — E039 Hypothyroidism, unspecified: Secondary | ICD-10-CM

## 2022-04-23 DIAGNOSIS — E7800 Pure hypercholesterolemia, unspecified: Secondary | ICD-10-CM | POA: Diagnosis not present

## 2022-04-23 LAB — URINALYSIS, ROUTINE W REFLEX MICROSCOPIC
Bilirubin Urine: NEGATIVE
Hgb urine dipstick: NEGATIVE
Ketones, ur: NEGATIVE
Nitrite: NEGATIVE
RBC / HPF: NONE SEEN (ref 0–?)
Specific Gravity, Urine: 1.02 (ref 1.000–1.030)
Total Protein, Urine: NEGATIVE
Urine Glucose: NEGATIVE
Urobilinogen, UA: 0.2 (ref 0.0–1.0)
pH: 6 (ref 5.0–8.0)

## 2022-04-23 LAB — BASIC METABOLIC PANEL
BUN: 22 mg/dL (ref 6–23)
CO2: 26 mEq/L (ref 19–32)
Calcium: 9.6 mg/dL (ref 8.4–10.5)
Chloride: 104 mEq/L (ref 96–112)
Creatinine, Ser: 1.08 mg/dL (ref 0.40–1.20)
GFR: 46.9 mL/min — ABNORMAL LOW (ref >60.00)
Glucose, Bld: 85 mg/dL (ref 70–99)
Potassium: 4.9 mEq/L (ref 3.5–5.1)
Sodium: 138 mEq/L (ref 135–145)

## 2022-04-23 LAB — LDL CHOLESTEROL, DIRECT: Direct LDL: 153 mg/dL

## 2022-04-23 LAB — HEMOGLOBIN A1C: Hgb A1c MFr Bld: 5.7 % (ref 4.6–6.5)

## 2022-04-23 LAB — TSH: TSH: 1.61 u[IU]/mL (ref 0.35–5.50)

## 2022-04-23 MED ORDER — AREXVY 120 MCG/0.5ML IM SUSR: 0.5000 mL | Freq: Once | INTRAMUSCULAR | 0 refills | Status: AC

## 2022-04-23 NOTE — Progress Notes (Signed)
   Established Patient Office Visit  Subjective   Patient ID: [name] [name], female    DOB: 1935/07/30  Age: 87 y.o. MRN: [id number]  Chief Complaint  Patient presents with   Follow-up    3 month follow up no concerns. Patient fasting.     HPI follow-up of hypertension, prediabetes, hypothyroidism and CKD.  Blood pressure controlled with low-dose Lotensin, continues with Sitagliptin well-controlled prediabetes.  Continues with levothyroxine.  CKD has been stable.  Patient is no longer interested in having mammograms.  She is accompanied by her son today.  Leukemia has been stable.    Review of Systems  Constitutional: Negative.   HENT: Negative.    Eyes:  Negative for blurred vision, discharge and redness.  Respiratory: Negative.    Cardiovascular: Negative.   Gastrointestinal:  Negative for abdominal pain.  Genitourinary: Negative.   Musculoskeletal: Negative.  Negative for myalgias.  Skin:  Negative for rash.  Neurological:  Negative for tingling, loss of consciousness and weakness.  Endo/Heme/Allergies:  Negative for polydipsia.      Objective:     BP 122/64 (BP Location: Right Arm, Patient Position: Sitting, Cuff Size: Normal)   Pulse 87   Temp (!) 97.1 F (36.2 C) (Temporal)   Ht '5\' 1"'$  (1.549 m)   Wt 119 lb (54 kg)   SpO2 98%   BMI 22.48 kg/m    Physical Exam   No results found for any visits on 04/23/22.    The ASCVD Risk score (Arnett DK, et al., 2019) failed to calculate for the following reasons:   The 2019 ASCVD risk score is only valid for ages 35 to 98    Assessment & Plan:   Problem List Items Addressed This Visit       Cardiovascular and Mediastinum   Essential hypertension   Relevant Orders   Basic metabolic panel   Urinalysis, Routine w reflex microscopic     Endocrine   Hypothyroidism   Relevant Orders   TSH     Genitourinary   Chronic kidney disease (CKD) stage G3b   Relevant Orders   Basic metabolic panel     Other    Elevated LDL cholesterol level   Relevant Orders   LDL cholesterol, direct   Need for RSV immunization   Relevant Medications   RSV vaccine recomb adjuvanted (AREXVY) 120 MCG/0.5ML injection   Prediabetes - Primary   Relevant Orders   Basic metabolic panel   Hemoglobin A1c    Return in about 6 months (around 10/22/2022), or if symptoms worsen or fail to improve.   Still considering change to SGTL2 inhibitor.  CKD has been stable.  Discussed briefly with patient and her son.  Continue Januvia for now as her diabetes is well controlled.  Recommended RSV vaccine.  Has follow-up with Dr. Marin Olp in a few weeks. Libby Maw, MD

## 2022-04-24 MED ORDER — PRAVASTATIN SODIUM 20 MG PO TABS: 20.0000 mg | ORAL_TABLET | Freq: Every day | ORAL | 3 refills | Status: DC

## 2022-04-24 NOTE — Addendum Note (Signed)
Addended by: Jon Billings on: 04/24/2022 12:23 PM   Modules accepted: Orders

## 2022-04-28 ENCOUNTER — Other Ambulatory Visit: Payer: Self-pay

## 2022-04-28 ENCOUNTER — Inpatient Hospital Stay: Payer: Medicare HMO | Attending: Hematology & Oncology | Admitting: Hematology & Oncology

## 2022-04-28 ENCOUNTER — Encounter: Payer: Self-pay | Admitting: Family

## 2022-04-28 ENCOUNTER — Inpatient Hospital Stay: Payer: Medicare HMO

## 2022-04-28 ENCOUNTER — Other Ambulatory Visit: Payer: Self-pay | Admitting: Hematology & Oncology

## 2022-04-28 ENCOUNTER — Encounter: Payer: Self-pay | Admitting: Hematology & Oncology

## 2022-04-28 VITALS — BP 146/62 | HR 92 | Temp 98.2°F | Resp 16 | Ht 61.0 in | Wt 121.0 lb

## 2022-04-28 DIAGNOSIS — M069 Rheumatoid arthritis, unspecified: Secondary | ICD-10-CM | POA: Insufficient documentation

## 2022-04-28 DIAGNOSIS — C91Z0 Other lymphoid leukemia not having achieved remission: Secondary | ICD-10-CM

## 2022-04-28 LAB — CBC WITH DIFFERENTIAL (CANCER CENTER ONLY)
Abs Immature Granulocytes: 0 10*3/uL (ref 0.00–0.07)
Basophils Absolute: 0.1 10*3/uL (ref 0.0–0.1)
Basophils Relative: 1 %
Eosinophils Absolute: 0 10*3/uL (ref 0.0–0.5)
Eosinophils Relative: 0 %
HCT: 36.7 % (ref 36.0–46.0)
Hemoglobin: 11.1 g/dL — ABNORMAL LOW (ref 12.0–15.0)
Immature Granulocytes: 0 %
Lymphocytes Relative: 88 %
Lymphs Abs: 11.3 10*3/uL — ABNORMAL HIGH (ref 0.7–4.0)
MCH: 25 pg — ABNORMAL LOW (ref 26.0–34.0)
MCHC: 30.2 g/dL (ref 30.0–36.0)
MCV: 82.7 fL (ref 80.0–100.0)
Monocytes Absolute: 0.8 10*3/uL (ref 0.1–1.0)
Monocytes Relative: 7 %
Neutro Abs: 0.5 10*3/uL — ABNORMAL LOW (ref 1.7–7.7)
Neutrophils Relative %: 4 %
Platelet Count: 177 10*3/uL (ref 150–400)
RBC: 4.44 MIL/uL (ref 3.87–5.11)
RDW: 16 % — ABNORMAL HIGH (ref 11.5–15.5)
Smear Review: NORMAL
WBC Count: 12.7 10*3/uL — ABNORMAL HIGH (ref 4.0–10.5)
nRBC: 0 % (ref 0.0–0.2)

## 2022-04-28 LAB — CMP (CANCER CENTER ONLY)
ALT: 14 U/L (ref 0–44)
AST: 23 U/L (ref 15–41)
Albumin: 4 g/dL (ref 3.5–5.0)
Alkaline Phosphatase: 68 U/L (ref 38–126)
Anion gap: 9 (ref 5–15)
BUN: 23 mg/dL (ref 8–23)
CO2: 24 mmol/L (ref 22–32)
Calcium: 9.7 mg/dL (ref 8.9–10.3)
Chloride: 106 mmol/L (ref 98–111)
Creatinine: 1.04 mg/dL — ABNORMAL HIGH (ref 0.44–1.00)
GFR, Estimated: 53 mL/min — ABNORMAL LOW (ref >60)
Glucose, Bld: 116 mg/dL — ABNORMAL HIGH (ref 70–99)
Potassium: 4.3 mmol/L (ref 3.5–5.1)
Sodium: 139 mmol/L (ref 135–145)
Total Bilirubin: 0.5 mg/dL (ref 0.3–1.2)
Total Protein: 7.5 g/dL (ref 6.5–8.1)

## 2022-04-28 LAB — LACTATE DEHYDROGENASE: LDH: 223 U/L — ABNORMAL HIGH (ref 98–192)

## 2022-04-28 LAB — SAVE SMEAR(SSMR), FOR PROVIDER SLIDE REVIEW

## 2022-04-28 MED ORDER — METHOTREXATE SODIUM 10 MG PO TABS: 20.0000 mg | ORAL_TABLET | ORAL | 2 refills | Status: DC

## 2022-04-28 NOTE — Progress Notes (Signed)
Hematology and Oncology Follow Up Visit  [name] [name] [id number] 08/29/1934 87 y.o. 04/28/2022   Principle Diagnosis:  Large granular lymphocytic leukemia Rheumatoid arthritis  Current Therapy:   Methotrexate 20 mg p.o. weekly- on 07/27/2021     Interim History:  [name] [name] is back for follow-up.  We last saw her back in June.  She is doing okay.  She had a decent summer.  She has had no problem with infections.  She has had no problems with arthritis flareup.  Her neutrophils are still incredibly low.  I will try to increase her methotrexate to 20 mg a day to see if that may help with this  LGL.  I think she sees her rheumatologist in a couple weeks.  She has had no nausea or vomiting.  There is been no cough or shortness of breath.  She has had no change in bowel or bladder habits.  There is been no diarrhea.  She has had no bleeding.  There is been a little bit of leg swelling which is chronic.  Overall, I would say performance status is probably ECOG 2.     Medications:  Current Outpatient Medications:    acetaminophen (TYLENOL) 650 MG CR tablet, Take 650 mg by mouth every 8 (eight) hours as needed for pain., Disp: , Rfl:    benazepril (LOTENSIN) 5 MG tablet, TAKE 2 TABLETS (10MG TOTAL) BY MOUTH EVERY DAY, Disp: 180 tablet, Rfl: 1   JANUVIA 25 MG tablet, TAKE 1 TABLET (25 MG TOTAL) BY MOUTH DAILY., Disp: 30 tablet, Rfl: 2   leflunomide (ARAVA) 20 MG tablet, TAKE 1 TABLET BY MOUTH EVERY DAY, Disp: 90 tablet, Rfl: 0   levothyroxine (SYNTHROID) 75 MCG tablet, TAKE 1 TABLET BY MOUTH EVERY DAY, Disp: 90 tablet, Rfl: 3   methotrexate (RHEUMATREX) 2.5 MG tablet, Take 6 tablets (15 mg total) by mouth once a week. Caution:Chemotherapy. Protect from light., Disp: 24 tablet, Rfl: 4   Multiple Minerals-Vitamins (CALCIUM CITRATE PLUS/MAGNESIUM) TABS, Take 1 tablet by mouth daily., Disp: , Rfl:    Polyethyl Glycol-Propyl Glycol (SYSTANE OP), Apply to eye as needed., Disp: , Rfl:     pravastatin (PRAVACHOL) 20 MG tablet, Take 1 tablet (20 mg total) by mouth daily. Needs appointment for future refills, Disp: 90 tablet, Rfl: 3  Allergies:  Allergies  Allergen Reactions   Tramadol Nausea Only   Codeine Phosphate Nausea Only    REACTION: unspecified    Past Medical History, Surgical history, Social history, and Family History were reviewed and updated.  Review of Systems: Review of Systems  Constitutional:  Positive for fatigue.  HENT:  Negative.    Eyes: Negative.   Respiratory: Negative.    Cardiovascular: Negative.   Gastrointestinal: Negative.   Endocrine: Negative.   Genitourinary: Negative.    Musculoskeletal:  Positive for arthralgias and myalgias.  Skin: Negative.   Neurological: Negative.   Hematological: Negative.   Psychiatric/Behavioral: Negative.      Physical Exam:  height is 5' 1" (1.549 m) and weight is 121 lb (54.9 kg). Her oral temperature is 98.2 F (36.8 C). Her blood pressure is 146/62 (abnormal) and her pulse is 92. Her respiration is 16 and oxygen saturation is 99%.   Wt Readings from Last 3 Encounters:  04/28/22 121 lb (54.9 kg)  04/23/22 119 lb (54 kg)  02/14/22 116 lb 3.2 oz (52.7 kg)    Physical Exam Vitals reviewed.  HENT:     Head: Normocephalic and atraumatic.  Eyes:       Pupils: Pupils are equal, round, and reactive to light.  Cardiovascular:     Rate and Rhythm: Normal rate and regular rhythm.     Heart sounds: Normal heart sounds.  Pulmonary:     Effort: Pulmonary effort is normal.     Breath sounds: Normal breath sounds.  Abdominal:     General: Bowel sounds are normal.     Palpations: Abdomen is soft.  Musculoskeletal:        General: No tenderness or deformity. Normal range of motion.     Cervical back: Normal range of motion.  Lymphadenopathy:     Cervical: No cervical adenopathy.  Skin:    General: Skin is warm and dry.     Findings: No erythema or rash.  Neurological:     Mental Status: She is alert  and oriented to person, place, and time.  Psychiatric:        Behavior: Behavior normal.        Thought Content: Thought content normal.        Judgment: Judgment normal.      Lab Results  Component Value Date   WBC 12.7 (H) 04/28/2022   HGB 11.1 (L) 04/28/2022   HCT 36.7 04/28/2022   MCV 82.7 04/28/2022   PLT 177 04/28/2022     Chemistry      Component Value Date/Time   NA 139 04/28/2022 0950   NA 141 02/03/2019 1129   K 4.3 04/28/2022 0950   CL 106 04/28/2022 0950   CO2 24 04/28/2022 0950   BUN 23 04/28/2022 0950   BUN 31 (H) 02/03/2019 1129   CREATININE 1.04 (H) 04/28/2022 0950   CREATININE 1.00 (H) 05/20/2021 1545      Component Value Date/Time   CALCIUM 9.7 04/28/2022 0950   ALKPHOS 68 04/28/2022 0950   AST 23 04/28/2022 0950   ALT 14 04/28/2022 0950   BILITOT 0.5 04/28/2022 0950      Impression and Plan: [name] [name] is a very charming 86-year-old white female.  She has rheumatoid arthritis.  She has marked lymphocytosis with neutropenia.  As such, she had a bone marrow biopsy done.  The results of the bone marrow biopsy are very consistent  with large granular lymphocytic leukemia.  I really wish that we would be able to get the neutrophils up higher.  This would really help her immune system.  Again we will try to increase the methotrexate to 20 mg a day.  I really would like to try to get her through the Holiday season.  I will need to see her back probably in November.  At that point, we will see if there is any increase in the neutrophils.    R , MD 9/25/202311:46 AM  

## 2022-04-29 ENCOUNTER — Encounter: Payer: Self-pay | Admitting: Family

## 2022-04-29 ENCOUNTER — Other Ambulatory Visit: Payer: Self-pay | Admitting: Hematology & Oncology

## 2022-05-02 NOTE — Progress Notes (Signed)
Office Visit Note  Patient: [name] [name]             Date of Birth: 1935-04-30           MRN: [id number]             PCP: Libby Maw, MD Referring: Libby Maw,* Visit Date: 05/16/2022 Occupation: '@GUAROCC'$ @  Subjective:  Medication monitoring   History of Present Illness: [name] [name] is a 87 y.o. female with history of seropositive rheumatoid arthritis and osteoarthritis.  She continues to take Arava 20 mg 1 tablet by mouth daily for rheumatoid arthritis and oral methotrexate weekly for LGL leukemia.  The dose of methotrexate was recently increased to 20 mg after her last office visit with Dr. Marin Olp on 04/28/2022.  She has not yet picked up the new prescription for methotrexate yet.  She denies any signs or symptoms of a rheumatoid arthritis flare.  She is not currently experiencing any joint pain or joint swelling.  She has not had any morning stiffness or nocturnal pain.  She denies any difficulty with ADLs.  She has not had any recent falls but uses a cane to assist with ambulation.  She denies any recent or recurrent infections.  She is not planning on getting the annual flu shot.     Activities of Daily Living:  Patient reports morning stiffness for 0 minutes Patient Denies nocturnal pain.  Difficulty dressing/grooming: Denies Difficulty climbing stairs: Denies Difficulty getting out of chair: Denies Difficulty using hands for taps, buttons, cutlery, and/or writing: Denies  Review of Systems  Constitutional:  Negative for fatigue.  HENT:  Negative for mouth sores and mouth dryness.   Eyes:  Positive for dryness.  Respiratory:  Negative for shortness of breath.   Cardiovascular:  Negative for chest pain and palpitations.  Gastrointestinal:  Negative for blood in stool, constipation and diarrhea.  Endocrine: Negative for increased urination.  Genitourinary:  Negative for involuntary urination.  Musculoskeletal:  Positive for gait problem.  Negative for joint pain, joint pain, joint swelling, myalgias, muscle weakness, morning stiffness, muscle tenderness and myalgias.  Skin:  Negative for color change, rash, hair loss and sensitivity to sunlight.  Allergic/Immunologic: Negative for susceptible to infections.  Neurological:  Negative for dizziness and headaches.  Hematological:  Negative for swollen glands.  Psychiatric/Behavioral:  Negative for depressed mood and sleep disturbance. The patient is not nervous/anxious.     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   Goals of care, counseling/discussion 08/06/2021   Need for influenza vaccination 04/18/2021   Iron deficiency anemia 11/20/2020   Lymphocytosis 10/16/2020   Neutropenia ([place]) 10/16/2020   Paroxysmal supraventricular tachycardia 10/15/2020   Elevated LDL cholesterol level 01/12/2020   DDD (degenerative disc disease), cervical 07/13/2018   Chronic kidney disease (CKD) stage G3b 01/21/2017   History of hypothyroidism 01/20/2017   History of congestive heart failure 08/26/2016   Osteopenia of multiple sites 08/25/2016   DDD cervical spine 08/25/2016   Primary osteoarthritis of both hands 08/25/2016   High risk medication use 06/11/2016   CHF, acute ([place]) 02/01/2013   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   Rheumatoid arthritis(714.0) 01/19/2007    Family History  Problem Relation Age of Onset   Diabetes Father    Heart disease Father    Cancer Sister        breast     Heart disease Sister    Heart attack Brother    Heart attack Brother    Heart attack Brother    Past Surgical History:  Procedure Laterality Date   ABDOMINAL HYSTERECTOMY     APPENDECTOMY     CHOLECYSTECTOMY     NECK SURGERY     Social History   Social History Narrative   Not on file   Immunization History  Administered Date(s) Administered   Fluad Quad(high Dose 65+) 05/06/2019, 04/18/2021   Influenza,inj,Quad PF,6+ Mos 07/06/2013   Pneumococcal Conjugate-13 07/06/2013   Pneumococcal Polysaccharide-23 04/09/2011, 06/21/2012   Tdap 04/09/2011     Objective: Vital Signs: BP 131/74 (BP Location: Left Arm, Patient Position: Sitting, Cuff Size: Small)   Pulse 85   Resp 12   Ht 5' 1.5" (1.562 m)   Wt 119 lb (54 kg)   BMI 22.12 kg/m    Physical Exam Vitals and nursing note reviewed.  Constitutional:      Appearance: She is well-developed.  HENT:     Head: Normocephalic and atraumatic.  Eyes:     Conjunctiva/sclera: Conjunctivae normal.  Cardiovascular:     Rate and Rhythm: Normal rate and regular rhythm.     Heart sounds: Normal heart sounds.  Pulmonary:     Effort: Pulmonary effort is normal.     Breath sounds: Normal breath sounds.  Abdominal:     General: Bowel sounds are normal.     Palpations: Abdomen is soft.  Musculoskeletal:     Cervical back: Normal range of motion.  Skin:    General: Skin is warm and dry.     Capillary Refill: Capillary refill takes less than 2 seconds.  Neurological:     Mental Status: She is alert and oriented to person, place, and time.  Psychiatric:        Behavior: Behavior normal.      Musculoskeletal Exam: C-spine has limited range of motion with lateral rotation.  No midline spinal tenderness or SI joint tenderness.  Shoulder joints, elbow joints, wrist joints, MCPs, PIPs, DIPs have good range of motion with no synovitis.  Complete fist formation bilaterally.  Hip joints have good range of motion with no groin pain.  Knee  joints have good range of motion with no warmth or effusion.  Small cyst palpable on the posterior-medial aspect of right knee.  Ankle joints have good range of motion with no tenderness or joint swelling.  CDAI Exam: CDAI Score: -- Patient Global: 0 mm; Provider Global: 0 mm Swollen: --; Tender: -- Joint Exam 05/16/2022   No joint exam has been documented for this visit   There is currently no information documented on the homunculus. Go to the Rheumatology activity and complete the homunculus joint exam.  Investigation: No additional findings.  Imaging: No results found.  Recent Labs: Lab Results  Component Value Date   WBC 12.7 (H) 04/28/2022   HGB 11.1 (L) 04/28/2022   PLT 177 04/28/2022   NA 139 04/28/2022   K 4.3 04/28/2022   CL 106 04/28/2022   CO2 24 04/28/2022   GLUCOSE 116 (H) 04/28/2022   BUN 23 04/28/2022   CREATININE 1.04 (H)  04/28/2022   BILITOT 0.5 04/28/2022   ALKPHOS 68 04/28/2022   AST 23 04/28/2022   ALT 14 04/28/2022   PROT 7.5 04/28/2022   ALBUMIN 4.0 04/28/2022   CALCIUM 9.7 04/28/2022   GFRAA 50 (L) 08/09/2020    Speciality Comments: PLQ Eye Exam: 10/04/2019 @ [facility]  Prior therapy: Methotrexate (lung injury) NOT CURRENTLY ON PLQ  Procedures:  No procedures performed Allergies: Tramadol and Codeine phosphate       Assessment / Plan:     Visit Diagnoses: Rheumatoid arthritis involving multiple sites with positive rheumatoid factor ([place]): She has no joint tenderness or synovitis on examination today.  She has not had any signs or symptoms of a rheumatoid arthritis flare.  She has clinically been doing well taking Arava 20 mg 1 tablet by mouth daily and methotrexate 20 mg weekly prescribed by Dr. Marin Olp for management of LGL leukemia.  She has not been experiencing any nocturnal pain or morning stiffness.  She has not had any difficulty with ADLs.  No recent falls but continues to use a cane to assist with ambulation. Reviewed  recent lab work from 04/28/2022: GFR was 53 and creatinine was 1.04.  I recommended reducing the dose of Arava to 10 mg 1 tablet daily.  A new prescription for refill be sent to the pharmacy today.  She was advised to notify us if she develops any new or worsening symptoms on the reduced dose of [place].  Discussed that if she remains flare free on the reduced dose of Arava I would recommend continuing on methotrexate as monotherapy in the future.  She will follow-up in the office in 3 months or sooner if needed.  High risk medication use - Arava 10 mg 1 tablet by mouth daily for rheumatoid arthritis and methotrexate 20 mg weekly for LGL leukemia-prescribed by Dr. Marin Olp.   Previous therapy: Methotrexate was previously discontinued due to elevated creatinine.  Patient also discontinued Plaquenil in the past. CBC and CMP drawn on 04/28/2022.  Creatinine was 1.04 and GFR was 53.  Discussed the risks of renal hepatotoxicity on methotrexate Arava combination.  Recommended reducing the dose of Arava as discussed above.  If she continues to remain flare free I would recommend remaining on methotrexate as monotherapy going forward.  We will rediscuss at her next follow-up visit in 3 months.  Primary osteoarthritis of both hands: She has no tenderness or inflammation on examination today.  Some PIP and DIP prominence consistent with osteoarthritis of both hands noted.  DDD (degenerative disc disease), cervical: C-spine has slightly limited range of motion with lateral rotation.  Trochanteric bursitis, right hip: Resolved.  Osteopenia of multiple sites: DEXA order remains in place.  Advised to schedule DEXA.  Chronic kidney disease (CKD) stage G3b: Stable.  Creatinine was 1.04 and GFR was 53 on 04/28/2022. Recommended reducing the dose of Arava to 10 mg daily. If she remains flare free I would recommend trying methotrexate as monotherapy for management of rheumatoid arthritis and LGL leukemia to minimize the risk  for renal and hepatic toxicity. Patient will be having updated lab work with her next follow-up with Dr. Marin Olp.  Large granular lymphocytic leukemia ([facility]) - Followed by Dr. Marin Olp.  Reviewed Dr. Antonieta Pert office visit note from 04/28/2022.   Initiated on methotrexate December 2022.   Patient's absolute neutrophil count remains low.  Dr. Marin Olp advised increasing the dose of methotrexate from 15 mg to 20 mg weekly.  Patient is scheduled to see Dr. Marin Olp again on  06/10/2022 at which time she will be having updated lab work. Advised patient to reduce Arava from 20 mg to 10 mg daily.  If she remains flare free in the future she can try methotrexate as monotherapy.   Other medical conditions are listed as follows:  History of iron deficiency anemia  Essential hypertension: Blood pressure is 131/74 today in the office.  History of congestive heart failure  Dyslipidemia  History of hypothyroidism  Lymphocytosis  Orders: No orders of the defined types were placed in this encounter.  No orders of the defined types were placed in this encounter.    Follow-Up Instructions: Return in about 3 months (around 08/16/2022) for Rheumatoid arthritis, Osteoarthritis.   Ofilia Neas, PA-C  Note - This record has been created using Dragon software.  Chart creation errors have been sought, but may not always  have been located. Such creation errors do not reflect on  the standard of medical care.

## 2022-05-13 ENCOUNTER — Ambulatory Visit: Payer: Medicare HMO | Admitting: Physician Assistant

## 2022-05-16 ENCOUNTER — Encounter: Payer: Self-pay | Admitting: Physician Assistant

## 2022-05-16 ENCOUNTER — Ambulatory Visit: Payer: Medicare HMO | Attending: Physician Assistant | Admitting: Physician Assistant

## 2022-05-16 ENCOUNTER — Other Ambulatory Visit: Payer: Self-pay | Admitting: *Deleted

## 2022-05-16 VITALS — BP 131/74 | HR 85 | Resp 12 | Ht 61.5 in | Wt 119.0 lb

## 2022-05-16 DIAGNOSIS — N1832 Chronic kidney disease, stage 3b: Secondary | ICD-10-CM

## 2022-05-16 DIAGNOSIS — E785 Hyperlipidemia, unspecified: Secondary | ICD-10-CM

## 2022-05-16 DIAGNOSIS — Z79899 Other long term (current) drug therapy: Secondary | ICD-10-CM | POA: Diagnosis not present

## 2022-05-16 DIAGNOSIS — Z8679 Personal history of other diseases of the circulatory system: Secondary | ICD-10-CM | POA: Diagnosis not present

## 2022-05-16 DIAGNOSIS — I10 Essential (primary) hypertension: Secondary | ICD-10-CM

## 2022-05-16 DIAGNOSIS — Z8639 Personal history of other endocrine, nutritional and metabolic disease: Secondary | ICD-10-CM

## 2022-05-16 DIAGNOSIS — M5030 Other cervical disc degeneration, unspecified cervical region: Secondary | ICD-10-CM | POA: Diagnosis not present

## 2022-05-16 DIAGNOSIS — Z862 Personal history of diseases of the blood and blood-forming organs and certain disorders involving the immune mechanism: Secondary | ICD-10-CM | POA: Diagnosis not present

## 2022-05-16 DIAGNOSIS — M0579 Rheumatoid arthritis with rheumatoid factor of multiple sites without organ or systems involvement: Secondary | ICD-10-CM | POA: Diagnosis not present

## 2022-05-16 DIAGNOSIS — M19041 Primary osteoarthritis, right hand: Secondary | ICD-10-CM | POA: Diagnosis not present

## 2022-05-16 DIAGNOSIS — M7061 Trochanteric bursitis, right hip: Secondary | ICD-10-CM

## 2022-05-16 DIAGNOSIS — M8589 Other specified disorders of bone density and structure, multiple sites: Secondary | ICD-10-CM

## 2022-05-16 DIAGNOSIS — D72820 Lymphocytosis (symptomatic): Secondary | ICD-10-CM

## 2022-05-16 DIAGNOSIS — M19042 Primary osteoarthritis, left hand: Secondary | ICD-10-CM

## 2022-05-16 DIAGNOSIS — C91Z0 Other lymphoid leukemia not having achieved remission: Secondary | ICD-10-CM | POA: Diagnosis not present

## 2022-05-16 MED ORDER — LEFLUNOMIDE 10 MG PO TABS: 10.0000 mg | ORAL_TABLET | Freq: Every day | ORAL | 0 refills | Status: DC

## 2022-05-16 NOTE — Patient Instructions (Signed)
Standing Labs We placed an order today for your standing lab work.   Please have your standing labs drawn in December and every 3 months   Please have your labs drawn 2 weeks prior to your appointment so that the provider can discuss your lab results at your appointment.  Please note that you may see your imaging and lab results in [facility] before we have reviewed them. We will contact you once all results are reviewed. Please allow our office up to 72 hours to thoroughly review all of the results before contacting the office for clarification of your results.  Lab hours are: Monday through Thursday from 1:30 pm-4:30 pm and Friday from 1:30 pm- 4:00 pm  You may experience shorter wait times on Monday, Thursday or Friday afternoons,.   Effective June 02, 2022, new lab hours will be: Monday through Thursday from 8:00 am -12:30 pm and 1:00 pm-5:00 pm and Friday from 8:00 am-12:00 pm.  Please be advised, all patients with office appointments requiring lab work will take precedent over walk-in lab work.   Labs are drawn by [facility]. Please bring your co-pay at the time of your lab draw.  You may receive a bill from [facility] for your lab work.  Please note if you are on Hydroxychloroquine and and an order has been placed for a Hydroxychloroquine level, you will need to have it drawn 4 hours or more after your last dose.  If you wish to have your labs drawn at another location, please call the office 24 hours in advance so we can fax the orders.  The office is located at [address] [place], [place], [place] [address] No appointment is necessary.    If you have any questions regarding directions or hours of operation,  please call [phone].   As a reminder, please drink plenty of water prior to coming for your lab work. Thanks!    If you have signs or symptoms of an infection or start antibiotics: First, call your PCP for workup of your infection. Hold your medication through the  infection, until you complete your antibiotics, and until symptoms resolve if you take the following: Injectable medication (Actemra, Benlysta, Cimzia, Cosentyx, Enbrel, Humira, Kevzara, Orencia, Remicade, Simponi, Stelara, Taltz, Tremfya) Methotrexate Leflunomide (Arava) Mycophenolate (Cellcept) Morrie Sheldon, Olumiant, or Rinvoq   Vaccines You are taking a medication(s) that can suppress your immune system.  The following immunizations are recommended: Flu annually Covid-19  Td/Tdap (tetanus, diphtheria, pertussis) every 10 years Pneumonia (Prevnar 15 then Pneumovax 23 at least 1 year apart.  Alternatively, can take Prevnar 20 without needing additional dose) Shingrix: 2 doses from 4 weeks to 6 months apart  Please check with your PCP to make sure you are up to date.

## 2022-05-16 NOTE — Telephone Encounter (Signed)
Please review and sign prescription for Arava 10 mg po daily. Thanks!

## 2022-06-10 ENCOUNTER — Other Ambulatory Visit: Payer: Self-pay

## 2022-06-10 ENCOUNTER — Encounter: Payer: Self-pay | Admitting: Hematology & Oncology

## 2022-06-10 ENCOUNTER — Inpatient Hospital Stay: Payer: Medicare HMO | Attending: Hematology & Oncology

## 2022-06-10 ENCOUNTER — Inpatient Hospital Stay (HOSPITAL_BASED_OUTPATIENT_CLINIC_OR_DEPARTMENT_OTHER): Payer: Medicare HMO | Admitting: Hematology & Oncology

## 2022-06-10 VITALS — BP 118/58 | HR 88 | Temp 97.8°F | Resp 19 | Ht 61.0 in | Wt 119.8 lb

## 2022-06-10 DIAGNOSIS — E875 Hyperkalemia: Secondary | ICD-10-CM | POA: Insufficient documentation

## 2022-06-10 DIAGNOSIS — Z79899 Other long term (current) drug therapy: Secondary | ICD-10-CM | POA: Insufficient documentation

## 2022-06-10 DIAGNOSIS — C91Z0 Other lymphoid leukemia not having achieved remission: Secondary | ICD-10-CM | POA: Insufficient documentation

## 2022-06-10 DIAGNOSIS — M069 Rheumatoid arthritis, unspecified: Secondary | ICD-10-CM | POA: Diagnosis not present

## 2022-06-10 LAB — CMP (CANCER CENTER ONLY)
ALT: 10 U/L (ref 0–44)
AST: 19 U/L (ref 15–41)
Albumin: 4 g/dL (ref 3.5–5.0)
Alkaline Phosphatase: 68 U/L (ref 38–126)
Anion gap: 7 (ref 5–15)
BUN: 23 mg/dL (ref 8–23)
CO2: 28 mmol/L (ref 22–32)
Calcium: 9.9 mg/dL (ref 8.9–10.3)
Chloride: 105 mmol/L (ref 98–111)
Creatinine: 1.11 mg/dL — ABNORMAL HIGH (ref 0.44–1.00)
GFR, Estimated: 49 mL/min — ABNORMAL LOW (ref >60)
Glucose, Bld: 109 mg/dL — ABNORMAL HIGH (ref 70–99)
Potassium: 5.8 mmol/L — ABNORMAL HIGH (ref 3.5–5.1)
Sodium: 140 mmol/L (ref 135–145)
Total Bilirubin: 0.4 mg/dL (ref 0.3–1.2)
Total Protein: 7.8 g/dL (ref 6.5–8.1)

## 2022-06-10 LAB — LACTATE DEHYDROGENASE: LDH: 206 U/L — ABNORMAL HIGH (ref 98–192)

## 2022-06-10 LAB — CBC WITH DIFFERENTIAL (CANCER CENTER ONLY)
Abs Immature Granulocytes: 0.01 10*3/uL (ref 0.00–0.07)
Basophils Absolute: 0.1 10*3/uL (ref 0.0–0.1)
Basophils Relative: 1 %
Eosinophils Absolute: 0.1 10*3/uL (ref 0.0–0.5)
Eosinophils Relative: 1 %
HCT: 35.8 % — ABNORMAL LOW (ref 36.0–46.0)
Hemoglobin: 10.8 g/dL — ABNORMAL LOW (ref 12.0–15.0)
Immature Granulocytes: 0 %
Lymphocytes Relative: 79 %
Lymphs Abs: 8.6 10*3/uL — ABNORMAL HIGH (ref 0.7–4.0)
MCH: 25.3 pg — ABNORMAL LOW (ref 26.0–34.0)
MCHC: 30.2 g/dL (ref 30.0–36.0)
MCV: 83.8 fL (ref 80.0–100.0)
Monocytes Absolute: 0.9 10*3/uL (ref 0.1–1.0)
Monocytes Relative: 9 %
Neutro Abs: 1.1 10*3/uL — ABNORMAL LOW (ref 1.7–7.7)
Neutrophils Relative %: 10 %
Platelet Count: 315 10*3/uL (ref 150–400)
RBC: 4.27 MIL/uL (ref 3.87–5.11)
RDW: 15.9 % — ABNORMAL HIGH (ref 11.5–15.5)
Smear Review: NORMAL
WBC Count: 10.8 10*3/uL — ABNORMAL HIGH (ref 4.0–10.5)
nRBC: 0 % (ref 0.0–0.2)

## 2022-06-10 LAB — SAVE SMEAR(SSMR), FOR PROVIDER SLIDE REVIEW

## 2022-06-10 NOTE — Progress Notes (Signed)
Hematology and Oncology Follow Up Visit  [name] [name] [id number] [birth year] 87 y.o. 06/10/2022   Principle Diagnosis:  Large granular lymphocytic leukemia Rheumatoid arthritis  Current Therapy:   Methotrexate 20 mg p.o. weekly- start on 07/27/2021     Interim History:  [name] [name] is back for follow-up.  She is doing okay with the increased dose of methotrexate.  She has had no nausea or vomiting.  She has had no problems with cough or shortness of breath.  There has been no change in bowel or bladder habits.  She has had no bleeding.  She does have rheumatoid arthritis.  This seems to be doing fairly well right now.  She does not complain of any joint aches or pains..  She has had no rashes.  She has had no bruising.  She has had no headache.  [name] appetite is doing okay.  It sounds like she we will go over to [name] for Thanksgiving.  Currently, I would say that [name] performance status is probably ECOG 2.       Medications:  Current Outpatient Medications:    acetaminophen (TYLENOL) 650 MG CR tablet, Take 650 mg by mouth every 8 (eight) hours as needed for pain., Disp: , Rfl:    benazepril (LOTENSIN) 5 MG tablet, TAKE 2 TABLETS ('10MG'$  TOTAL) BY MOUTH EVERY DAY, Disp: 180 tablet, Rfl: 1   JANUVIA 25 MG tablet, TAKE 1 TABLET (25 MG TOTAL) BY MOUTH DAILY., Disp: 30 tablet, Rfl: 2   leflunomide (ARAVA) 10 MG tablet, Take 1 tablet (10 mg total) by mouth daily., Disp: 90 tablet, Rfl: 0   levothyroxine (SYNTHROID) 75 MCG tablet, TAKE 1 TABLET BY MOUTH EVERY DAY, Disp: 90 tablet, Rfl: 3   methotrexate (RHEUMATREX) 10 MG tablet, Take 2 tablets (20 mg total) by mouth once a week., Disp: 8 tablet, Rfl: 3   Multiple Minerals-Vitamins (CALCIUM CITRATE PLUS/MAGNESIUM) TABS, Take 1 tablet by mouth daily., Disp: , Rfl:    Polyethyl Glycol-Propyl Glycol (SYSTANE OP), Apply to eye as needed., Disp: , Rfl:    pravastatin (PRAVACHOL) 20 MG tablet, Take 1 tablet (20 mg total) by mouth daily.  Needs appointment for future refills, Disp: 90 tablet, Rfl: 3  Allergies:  Allergies  Allergen Reactions   Tramadol Nausea Only   Codeine Phosphate Nausea Only    REACTION: unspecified    Past Medical History, Surgical history, Social history, and Family History were reviewed and updated.  Review of Systems: Review of Systems  Constitutional:  Positive for fatigue.  HENT:  Negative.    Eyes: Negative.   Respiratory: Negative.    Cardiovascular: Negative.   Gastrointestinal: Negative.   Endocrine: Negative.   Genitourinary: Negative.    Musculoskeletal:  Positive for arthralgias and myalgias.  Skin: Negative.   Neurological: Negative.   Hematological: Negative.   Psychiatric/Behavioral: Negative.      Physical Exam:  height is '5\' 1"'$  (1.549 m) and weight is 119 lb 12.8 oz (54.3 kg). [name] oral temperature is 97.8 F (36.6 C). [name] blood pressure is 118/58 (abnormal) and [name] pulse is 88. [name] respiration is 19 and oxygen saturation is 100%.   Wt Readings from Last 3 Encounters:  06/10/22 119 lb 12.8 oz (54.3 kg)  05/16/22 119 lb (54 kg)  04/28/22 121 lb (54.9 kg)    Physical Exam Vitals reviewed.  HENT:     Head: Normocephalic and atraumatic.  Eyes:     Pupils: Pupils are equal, round, and reactive to light.  Cardiovascular:     Rate and Rhythm: Normal rate and regular rhythm.     Heart sounds: Normal heart sounds.  Pulmonary:     Effort: Pulmonary effort is normal.     Breath sounds: Normal breath sounds.  Abdominal:     General: Bowel sounds are normal.     Palpations: Abdomen is soft.  Musculoskeletal:        General: No tenderness or deformity. Normal range of motion.     Cervical back: Normal range of motion.  Lymphadenopathy:     Cervical: No cervical adenopathy.  Skin:    General: Skin is warm and dry.     Findings: No erythema or rash.  Neurological:     Mental Status: She is alert and oriented to person, place, and time.  Psychiatric:         Behavior: Behavior normal.        Thought Content: Thought content normal.        Judgment: Judgment normal.     Lab Results  Component Value Date   WBC 10.8 (H) 06/10/2022   HGB 10.8 (L) 06/10/2022   HCT 35.8 (L) 06/10/2022   MCV 83.8 06/10/2022   PLT 315 06/10/2022     Chemistry      Component Value Date/Time   NA 140 06/10/2022 0921   NA 141 02/03/2019 1129   K 5.8 (H) 06/10/2022 0921   CL 105 06/10/2022 0921   CO2 28 06/10/2022 0921   BUN 23 06/10/2022 0921   BUN 31 (H) 02/03/2019 1129   CREATININE 1.11 (H) 06/10/2022 0921   CREATININE 1.00 (H) 05/20/2021 1545      Component Value Date/Time   CALCIUM 9.9 06/10/2022 0921   ALKPHOS 68 06/10/2022 0921   AST 19 06/10/2022 0921   ALT 10 06/10/2022 0921   BILITOT 0.4 06/10/2022 0921      Impression and Plan: [name] [name] is a very charming 87 year old white female.  She has rheumatoid arthritis.  [name] white cell count is better.  Hopefully, this is from the increasing methotrexate dose.  I did look at [name] blood smear under the microscope.  I do not see anything that looked suspicious.  I did note that [name] potassium is quite high.  We will have to repeat the potassium in about a day.  I do not see that she is taking any potassium pills.  Again, we will see what the potassium level is tomorrow.  I will plan to get [name] back after the holidays now.  I know she will be busy during the holidays.  Hopefully, she will enjoy them with [name] family.    Volanda Napoleon, MD 11/7/202310:28 AM

## 2022-06-13 ENCOUNTER — Telehealth: Payer: Self-pay | Admitting: *Deleted

## 2022-06-13 ENCOUNTER — Other Ambulatory Visit: Payer: Self-pay | Admitting: *Deleted

## 2022-06-13 ENCOUNTER — Inpatient Hospital Stay: Payer: Medicare HMO

## 2022-06-13 DIAGNOSIS — M069 Rheumatoid arthritis, unspecified: Secondary | ICD-10-CM | POA: Diagnosis not present

## 2022-06-13 DIAGNOSIS — Z79899 Other long term (current) drug therapy: Secondary | ICD-10-CM | POA: Diagnosis not present

## 2022-06-13 DIAGNOSIS — E875 Hyperkalemia: Secondary | ICD-10-CM | POA: Diagnosis not present

## 2022-06-13 DIAGNOSIS — C91Z0 Other lymphoid leukemia not having achieved remission: Secondary | ICD-10-CM | POA: Diagnosis not present

## 2022-06-13 LAB — BASIC METABOLIC PANEL - CANCER CENTER ONLY
Anion gap: 6 (ref 5–15)
BUN: 20 mg/dL (ref 8–23)
CO2: 28 mmol/L (ref 22–32)
Calcium: 9.9 mg/dL (ref 8.9–10.3)
Chloride: 104 mmol/L (ref 98–111)
Creatinine: 1.14 mg/dL — ABNORMAL HIGH (ref 0.44–1.00)
GFR, Estimated: 47 mL/min — ABNORMAL LOW (ref >60)
Glucose, Bld: 115 mg/dL — ABNORMAL HIGH (ref 70–99)
Potassium: 5.6 mmol/L — ABNORMAL HIGH (ref 3.5–5.1)
Sodium: 138 mmol/L (ref 135–145)

## 2022-06-13 NOTE — Telephone Encounter (Addendum)
-----  Message from Volanda Napoleon, MD sent at 06/13/2022 12:16 PM EST ----- Called patients son [name] [name] and let her know that the potassium is a little bit better at 5.6.  Dr Marin Olp would like for patient to have another lab appointment [facility] in 10 days.  Appt made with [name] [name] for recheck of potassium

## 2022-06-23 ENCOUNTER — Inpatient Hospital Stay: Payer: Medicare HMO

## 2022-06-23 ENCOUNTER — Telehealth: Payer: Self-pay

## 2022-06-23 DIAGNOSIS — C91Z0 Other lymphoid leukemia not having achieved remission: Secondary | ICD-10-CM | POA: Diagnosis not present

## 2022-06-23 DIAGNOSIS — E875 Hyperkalemia: Secondary | ICD-10-CM | POA: Diagnosis not present

## 2022-06-23 DIAGNOSIS — Z79899 Other long term (current) drug therapy: Secondary | ICD-10-CM | POA: Diagnosis not present

## 2022-06-23 DIAGNOSIS — M069 Rheumatoid arthritis, unspecified: Secondary | ICD-10-CM | POA: Diagnosis not present

## 2022-06-23 LAB — BASIC METABOLIC PANEL - CANCER CENTER ONLY
Anion gap: 7 (ref 5–15)
BUN: 17 mg/dL (ref 8–23)
CO2: 28 mmol/L (ref 22–32)
Calcium: 9.4 mg/dL (ref 8.9–10.3)
Chloride: 104 mmol/L (ref 98–111)
Creatinine: 1.05 mg/dL — ABNORMAL HIGH (ref 0.44–1.00)
GFR, Estimated: 52 mL/min — ABNORMAL LOW (ref >60)
Glucose, Bld: 117 mg/dL — ABNORMAL HIGH (ref 70–99)
Potassium: 4.6 mmol/L (ref 3.5–5.1)
Sodium: 139 mmol/L (ref 135–145)

## 2022-06-23 NOTE — Telephone Encounter (Signed)
Per Dr. Marin Olp request pt called and this RN spoke with her son [name]. [name] educated that patient potassium is much better and 4.6 which is normal. [name] was appreciative of call and had no further questions. Appointments reviewed and understanding verbalized.

## 2022-06-23 NOTE — Telephone Encounter (Signed)
-----   Message from Volanda Napoleon, MD sent at 06/23/2022 12:05 PM EST ----- Call and let her know that the potassium is much better.  It is 4.6 now.  Please have a very happy Thanksgiving.  Laurey Arrow

## 2022-08-03 ENCOUNTER — Other Ambulatory Visit: Payer: Self-pay | Admitting: Family Medicine

## 2022-08-03 DIAGNOSIS — N1832 Chronic kidney disease, stage 3b: Secondary | ICD-10-CM

## 2022-08-03 DIAGNOSIS — Z8679 Personal history of other diseases of the circulatory system: Secondary | ICD-10-CM

## 2022-08-05 ENCOUNTER — Inpatient Hospital Stay: Payer: Medicare HMO | Admitting: Hematology & Oncology

## 2022-08-05 ENCOUNTER — Other Ambulatory Visit: Payer: Self-pay

## 2022-08-05 ENCOUNTER — Inpatient Hospital Stay: Payer: Medicare HMO | Attending: Hematology & Oncology

## 2022-08-05 ENCOUNTER — Encounter: Payer: Self-pay | Admitting: Hematology & Oncology

## 2022-08-05 VITALS — BP 151/54 | HR 95 | Temp 98.8°F | Resp 16 | Ht 61.0 in | Wt 117.0 lb

## 2022-08-05 DIAGNOSIS — M069 Rheumatoid arthritis, unspecified: Secondary | ICD-10-CM | POA: Diagnosis not present

## 2022-08-05 DIAGNOSIS — D709 Neutropenia, unspecified: Secondary | ICD-10-CM | POA: Insufficient documentation

## 2022-08-05 DIAGNOSIS — M05729 Rheumatoid arthritis with rheumatoid factor of unspecified elbow without organ or systems involvement: Secondary | ICD-10-CM | POA: Diagnosis not present

## 2022-08-05 DIAGNOSIS — C91Z0 Other lymphoid leukemia not having achieved remission: Secondary | ICD-10-CM

## 2022-08-05 DIAGNOSIS — Z79899 Other long term (current) drug therapy: Secondary | ICD-10-CM | POA: Diagnosis not present

## 2022-08-05 LAB — CMP (CANCER CENTER ONLY)
ALT: 11 U/L (ref 0–44)
AST: 22 U/L (ref 15–41)
Albumin: 4 g/dL (ref 3.5–5.0)
Alkaline Phosphatase: 67 U/L (ref 38–126)
Anion gap: 10 (ref 5–15)
BUN: 19 mg/dL (ref 8–23)
CO2: 26 mmol/L (ref 22–32)
Calcium: 9.3 mg/dL (ref 8.9–10.3)
Chloride: 103 mmol/L (ref 98–111)
Creatinine: 1.13 mg/dL — ABNORMAL HIGH (ref 0.44–1.00)
GFR, Estimated: 48 mL/min — ABNORMAL LOW (ref >60)
Glucose, Bld: 102 mg/dL — ABNORMAL HIGH (ref 70–99)
Potassium: 4.6 mmol/L (ref 3.5–5.1)
Sodium: 139 mmol/L (ref 135–145)
Total Bilirubin: 0.4 mg/dL (ref 0.3–1.2)
Total Protein: 8.3 g/dL — ABNORMAL HIGH (ref 6.5–8.1)

## 2022-08-05 LAB — CBC WITH DIFFERENTIAL (CANCER CENTER ONLY)
Abs Immature Granulocytes: 0 10*3/uL (ref 0.00–0.07)
Basophils Absolute: 0 10*3/uL (ref 0.0–0.1)
Basophils Relative: 0 %
Eosinophils Absolute: 0 10*3/uL (ref 0.0–0.5)
Eosinophils Relative: 0 %
HCT: 37.6 % (ref 36.0–46.0)
Hemoglobin: 11.4 g/dL — ABNORMAL LOW (ref 12.0–15.0)
Immature Granulocytes: 0 %
Lymphocytes Relative: 86 %
Lymphs Abs: 8.4 10*3/uL — ABNORMAL HIGH (ref 0.7–4.0)
MCH: 24.9 pg — ABNORMAL LOW (ref 26.0–34.0)
MCHC: 30.3 g/dL (ref 30.0–36.0)
MCV: 82.3 fL (ref 80.0–100.0)
Monocytes Absolute: 0.7 10*3/uL (ref 0.1–1.0)
Monocytes Relative: 8 %
Neutro Abs: 0.6 10*3/uL — ABNORMAL LOW (ref 1.7–7.7)
Neutrophils Relative %: 6 %
Platelet Count: 188 10*3/uL (ref 150–400)
RBC: 4.57 MIL/uL (ref 3.87–5.11)
RDW: 15.7 % — ABNORMAL HIGH (ref 11.5–15.5)
Smear Review: NORMAL
WBC Count: 9.7 10*3/uL (ref 4.0–10.5)
nRBC: 0 % (ref 0.0–0.2)

## 2022-08-05 LAB — SAVE SMEAR(SSMR), FOR PROVIDER SLIDE REVIEW

## 2022-08-05 LAB — LACTATE DEHYDROGENASE: LDH: 205 U/L — ABNORMAL HIGH (ref 98–192)

## 2022-08-05 NOTE — Progress Notes (Signed)
Hematology and Oncology Follow Up Visit  [name] [name] [id number] [birth year] 88 y.o. 08/05/2022   Principle Diagnosis:  Large granular lymphocytic leukemia Rheumatoid arthritis  Current Therapy:   Methotrexate 20 mg p.o. weekly- start on 07/27/2021     Interim History:  [name] is back for follow-up.  She has had no problems over the holiday season.  She was with her family.  She ate well.  She still has a problem with the rheumatoid arthritis.  She is doing well with the methotrexate.  She has had no nausea or vomiting with this.  She has had no problem with her skin.  She has had no rashes.  There is been no bleeding.  She has had no change in bowel or bladder habits.  She has had no fever.  Currently, I would have said that her performance status is probably ECOG 2.      Medications:  Current Outpatient Medications:    methotrexate (RHEUMATREX) 2.5 MG tablet, Take 2.5 mg by mouth., Disp: , Rfl:    acetaminophen (TYLENOL) 650 MG CR tablet, Take 650 mg by mouth every 8 (eight) hours as needed for pain., Disp: , Rfl:    benazepril (LOTENSIN) 5 MG tablet, TAKE 2 TABLETS ('10MG'$  TOTAL) BY MOUTH EVERY DAY, Disp: 180 tablet, Rfl: 1   JANUVIA 25 MG tablet, TAKE 1 TABLET (25 MG TOTAL) BY MOUTH DAILY., Disp: 30 tablet, Rfl: 2   leflunomide (ARAVA) 10 MG tablet, Take 1 tablet (10 mg total) by mouth daily., Disp: 90 tablet, Rfl: 0   levothyroxine (SYNTHROID) 75 MCG tablet, TAKE 1 TABLET BY MOUTH EVERY DAY, Disp: 90 tablet, Rfl: 3   methotrexate (RHEUMATREX) 10 MG tablet, Take 2 tablets (20 mg total) by mouth once a week., Disp: 8 tablet, Rfl: 3   Multiple Minerals-Vitamins (CALCIUM CITRATE PLUS/MAGNESIUM) TABS, Take 1 tablet by mouth daily., Disp: , Rfl:    Polyethyl Glycol-Propyl Glycol (SYSTANE OP), Apply to eye as needed., Disp: , Rfl:    pravastatin (PRAVACHOL) 20 MG tablet, Take 1 tablet (20 mg total) by mouth daily. Needs appointment for future refills, Disp: 90 tablet, Rfl:  3  Allergies:  Allergies  Allergen Reactions   Tramadol Nausea Only   Codeine Phosphate Nausea Only    REACTION: unspecified    Past Medical History, Surgical history, Social history, and Family History were reviewed and updated.  Review of Systems: Review of Systems  Constitutional:  Positive for fatigue.  HENT:  Negative.    Eyes: Negative.   Respiratory: Negative.    Cardiovascular: Negative.   Gastrointestinal: Negative.   Endocrine: Negative.   Genitourinary: Negative.    Musculoskeletal:  Positive for arthralgias and myalgias.  Skin: Negative.   Neurological: Negative.   Hematological: Negative.   Psychiatric/Behavioral: Negative.      Physical Exam:  height is '5\' 1"'$  (1.549 m) and weight is 117 lb (53.1 kg). Her oral temperature is 98.8 F (37.1 C). Her blood pressure is 151/54 (abnormal) and her pulse is 95. Her respiration is 16 and oxygen saturation is 100%.   Wt Readings from Last 3 Encounters:  08/05/22 117 lb (53.1 kg)  06/10/22 119 lb 12.8 oz (54.3 kg)  05/16/22 119 lb (54 kg)    Physical Exam Vitals reviewed.  HENT:     Head: Normocephalic and atraumatic.  Eyes:     Pupils: Pupils are equal, round, and reactive to light.  Cardiovascular:     Rate and Rhythm: Normal rate and regular rhythm.  Heart sounds: Normal heart sounds.  Pulmonary:     Effort: Pulmonary effort is normal.     Breath sounds: Normal breath sounds.  Abdominal:     General: Bowel sounds are normal.     Palpations: Abdomen is soft.  Musculoskeletal:        General: No tenderness or deformity. Normal range of motion.     Cervical back: Normal range of motion.  Lymphadenopathy:     Cervical: No cervical adenopathy.  Skin:    General: Skin is warm and dry.     Findings: No erythema or rash.  Neurological:     Mental Status: She is alert and oriented to person, place, and time.  Psychiatric:        Behavior: Behavior normal.        Thought Content: Thought content normal.         Judgment: Judgment normal.      Lab Results  Component Value Date   WBC 9.7 08/05/2022   HGB 11.4 (L) 08/05/2022   HCT 37.6 08/05/2022   MCV 82.3 08/05/2022   PLT 188 08/05/2022     Chemistry      Component Value Date/Time   NA 139 08/05/2022 0928   NA 141 02/03/2019 1129   K 4.6 08/05/2022 0928   CL 103 08/05/2022 0928   CO2 26 08/05/2022 0928   BUN 19 08/05/2022 0928   BUN 31 (H) 02/03/2019 1129   CREATININE 1.13 (H) 08/05/2022 0928   CREATININE 1.00 (H) 05/20/2021 1545      Component Value Date/Time   CALCIUM 9.3 08/05/2022 0928   ALKPHOS 67 08/05/2022 0928   AST 22 08/05/2022 0928   ALT 11 08/05/2022 0928   BILITOT 0.4 08/05/2022 0928      Impression and Plan: [name] is a very charming 88 year old white female.  She has rheumatoid arthritis.  We have her on methotrexate.  Her white cell count is basically stable.  She is still somewhat neutropenic.  However, she is totally asymptomatic.  I am not going to make any changes with the methotrexate dose.  Again I am happy that she is not symptomatic with her blood counts.  Thankfully, she really is not that anemic.  Her liver tests all look fine so the methotrexate dose is not affecting her liver.  Will plan to get her back in another 6 weeks now.  Will try to get her through most of the Winter.    Volanda Napoleon, MD 1/2/202410:12 AM

## 2022-08-08 ENCOUNTER — Ambulatory Visit (INDEPENDENT_AMBULATORY_CARE_PROVIDER_SITE_OTHER): Payer: Medicare HMO

## 2022-08-08 VITALS — Ht 61.0 in | Wt 117.0 lb

## 2022-08-08 DIAGNOSIS — Z0000 Encounter for general adult medical examination without abnormal findings: Secondary | ICD-10-CM | POA: Diagnosis not present

## 2022-08-08 NOTE — Patient Instructions (Signed)
[name] [name] , Thank you for taking time to come for your Medicare Wellness Visit. I appreciate your ongoing commitment to your health goals. Please review the following plan we discussed and let me know if I can assist you in the future.   These are the goals we discussed:  Goals      Maintain healthy lifestyle and independence     Patient Stated     08/08/2022, no goals        This is a list of the screening recommended for you and due dates:  Health Maintenance  Topic Date Due   DTaP/Tdap/Td vaccine (2 - Td or Tdap) 04/08/2021   Medicare Annual Wellness Visit  08/09/2023   Pneumonia Vaccine  Completed   DEXA scan (bone density measurement)  Completed   HPV Vaccine  Aged Out   Flu Shot  Discontinued   Mammogram  Discontinued   COVID-19 Vaccine  Discontinued   Zoster (Shingles) Vaccine  Discontinued    Advanced directives: Please bring a copy of your POA (Power of [place]) and/or Living Will to your next appointment.   Conditions/risks identified: none  Next appointment: Follow up in one year for your annual wellness visit    Preventive Care 65 Years and Older, Female Preventive care refers to lifestyle choices and visits with your health care provider that can promote health and wellness. What does preventive care include? A yearly physical exam. This is also called an annual well check. Dental exams once or twice a year. Routine eye exams. Ask your health care provider how often you should have your eyes checked. Personal lifestyle choices, including: Daily care of your teeth and gums. Regular physical activity. Eating a healthy diet. Avoiding tobacco and drug use. Limiting alcohol use. Practicing safe sex. Taking low-dose aspirin every day. Taking vitamin and mineral supplements as recommended by your health care provider. What happens during an annual well check? The services and screenings done by your health care provider during your annual well check will  depend on your age, overall health, lifestyle risk factors, and family history of disease. Counseling  Your health care provider may ask you questions about your: Alcohol use. Tobacco use. Drug use. Emotional well-being. Home and relationship well-being. Sexual activity. Eating habits. History of falls. Memory and ability to understand (cognition). Work and work Statistician. Reproductive health. Screening  You may have the following tests or measurements: Height, weight, and BMI. Blood pressure. Lipid and cholesterol levels. These may be checked every 5 years, or more frequently if you are over 28 years old. Skin check. Lung cancer screening. You may have this screening every year starting at age 68 if you have a 30-pack-year history of smoking and currently smoke or have quit within the past 15 years. Fecal occult blood test (FOBT) of the stool. You may have this test every year starting at age 67. Flexible sigmoidoscopy or colonoscopy. You may have a sigmoidoscopy every 5 years or a colonoscopy every 10 years starting at age 38. Hepatitis C blood test. Hepatitis B blood test. Sexually transmitted disease (STD) testing. Diabetes screening. This is done by checking your blood sugar (glucose) after you have not eaten for a while (fasting). You may have this done every 1-3 years. Bone density scan. This is done to screen for osteoporosis. You may have this done starting at age 75. Mammogram. This may be done every 1-2 years. Talk to your health care provider about how often you should have regular mammograms. Talk with  your health care provider about your test results, treatment options, and if necessary, the need for more tests. Vaccines  Your health care provider may recommend certain vaccines, such as: Influenza vaccine. This is recommended every year. Tetanus, diphtheria, and acellular pertussis (Tdap, Td) vaccine. You may need a Td booster every 10 years. Zoster vaccine. You may  need this after age 54. Pneumococcal 13-valent conjugate (PCV13) vaccine. One dose is recommended after age 25. Pneumococcal polysaccharide (PPSV23) vaccine. One dose is recommended after age 49. Talk to your health care provider about which screenings and vaccines you need and how often you need them. This information is not intended to replace advice given to you by your health care provider. Make sure you discuss any questions you have with your health care provider. Document Released: 08/17/2015 Document Revised: 04/09/2016 Document Reviewed: 05/22/2015 Elsevier Interactive Patient Education  2017 [place] Prevention in the Home Falls can cause injuries. They can happen to people of all ages. There are many things you can do to make your home safe and to help prevent falls. What can I do on the outside of my home? Regularly fix the edges of walkways and driveways and fix any cracks. Remove anything that might make you trip as you walk through a door, such as a raised step or threshold. Trim any bushes or trees on the path to your home. Use bright outdoor lighting. Clear any walking paths of anything that might make someone trip, such as rocks or tools. Regularly check to see if handrails are loose or broken. Make sure that both sides of any steps have handrails. Any raised decks and porches should have guardrails on the edges. Have any leaves, snow, or ice cleared regularly. Use sand or salt on walking paths during winter. Clean up any spills in your garage right away. This includes oil or grease spills. What can I do in the bathroom? Use night lights. Install grab bars by the toilet and in the tub and shower. Do not use towel bars as grab bars. Use non-skid mats or decals in the tub or shower. If you need to sit down in the shower, use a plastic, non-slip stool. Keep the floor dry. Clean up any water that spills on the floor as soon as it happens. Remove soap buildup in  the tub or shower regularly. Attach bath mats securely with double-sided non-slip rug tape. Do not have throw rugs and other things on the floor that can make you trip. What can I do in the bedroom? Use night lights. Make sure that you have a light by your bed that is easy to reach. Do not use any sheets or blankets that are too big for your bed. They should not hang down onto the floor. Have a firm chair that has side arms. You can use this for support while you get dressed. Do not have throw rugs and other things on the floor that can make you trip. What can I do in the kitchen? Clean up any spills right away. Avoid walking on wet floors. Keep items that you use a lot in easy-to-reach places. If you need to reach something above you, use a strong step stool that has a grab bar. Keep electrical cords out of the way. Do not use floor polish or wax that makes floors slippery. If you must use wax, use non-skid floor wax. Do not have throw rugs and other things on the floor that can make you trip.  What can I do with my stairs? Do not leave any items on the stairs. Make sure that there are handrails on both sides of the stairs and use them. Fix handrails that are broken or loose. Make sure that handrails are as long as the stairways. Check any carpeting to make sure that it is firmly attached to the stairs. Fix any carpet that is loose or worn. Avoid having throw rugs at the top or bottom of the stairs. If you do have throw rugs, attach them to the floor with carpet tape. Make sure that you have a light switch at the top of the stairs and the bottom of the stairs. If you do not have them, ask someone to add them for you. What else can I do to help prevent falls? Wear shoes that: Do not have high heels. Have rubber bottoms. Are comfortable and fit you well. Are closed at the toe. Do not wear sandals. If you use a stepladder: Make sure that it is fully opened. Do not climb a closed  stepladder. Make sure that both sides of the stepladder are locked into place. Ask someone to hold it for you, if possible. Clearly mark and make sure that you can see: Any grab bars or handrails. First and last steps. Where the edge of each step is. Use tools that help you move around (mobility aids) if they are needed. These include: Canes. Walkers. Scooters. Crutches. Turn on the lights when you go into a dark area. Replace any light bulbs as soon as they burn out. Set up your furniture so you have a clear path. Avoid moving your furniture around. If any of your floors are uneven, fix them. If there are any pets around you, be aware of where they are. Review your medicines with your doctor. Some medicines can make you feel dizzy. This can increase your chance of falling. Ask your doctor what other things that you can do to help prevent falls. This information is not intended to replace advice given to you by your health care provider. Make sure you discuss any questions you have with your health care provider. Document Released: 05/17/2009 Document Revised: 12/27/2015 Document Reviewed: 08/25/2014 Elsevier Interactive Patient Education  2017 [employer].

## 2022-08-08 NOTE — Progress Notes (Signed)
I connected with [facility] today by telephone and verified that I am speaking with the correct person using two identifiers. Location patient: home Location provider: work Persons participating in the virtual visit: [employer], [name] (son), Glenna Durand LPN.   I discussed the limitations, risks, security and privacy concerns of performing an evaluation and management service by telephone and the availability of in person appointments. I also discussed with the patient that there may be a patient responsible charge related to this service. The patient expressed understanding and verbally consented to this telephonic visit.    Interactive audio and video telecommunications were attempted between this provider and patient, however failed, due to patient having technical difficulties OR patient did not have access to video capability.  We continued and completed visit with audio only.     Vital signs may be patient reported or missing.  Subjective:   [name] [name] is a 88 y.o. female who presents for Medicare Annual (Subsequent) preventive examination.  Review of Systems     Cardiac Risk Factors include: advanced age (>69mn, >>63women);dyslipidemia;hypertension     Objective:    Today's Vitals   08/08/22 0926  Weight: 117 lb (53.1 kg)  Height: '5\' 1"'$  (1.549 m)   Body mass index is 22.11 kg/m.     08/08/2022    9:33 AM 08/05/2022    9:58 AM 06/10/2022    9:49 AM 04/28/2022   10:42 AM 01/30/2022    9:50 AM 11/12/2021   10:37 AM 09/12/2021   10:21 AM  Advanced Directives  Does Patient Have a Medical Advance Directive? Yes Yes Yes No Yes Yes Yes  Type of AParamed[place]of [place] will [place] will [place] will  [place] will [place] will H[place] Lag[place]Living will  Does patient want to make changes to medical advance directive?   No - Patient declined No - Patient declined No - Patient declined No - Patient declined No - Patient declined No - Patient declined  Copy of [place] Chart? No - copy requested  No - copy requested  No - copy requested No - copy requested No - copy requested  Would patient like information on creating a medical advance directive?   No - Patient declined  No - Patient declined No - Patient declined No - Patient declined    Current Medications (verified) Outpatient Encounter Medications as of 08/08/2022  Medication Sig   acetaminophen (TYLENOL) 650 MG CR tablet Take 650 mg by mouth every 8 (eight) hours as needed for pain.   benazepril (LOTENSIN) 5 MG tablet TAKE 2 TABLETS ('10MG'$  TOTAL) BY MOUTH EVERY DAY   JANUVIA 25 MG tablet TAKE 1 TABLET (25 MG TOTAL) BY MOUTH DAILY.   leflunomide (ARAVA) 10 MG tablet Take 1 tablet (10 mg total) by mouth daily.   levothyroxine (SYNTHROID) 75 MCG tablet TAKE 1 TABLET BY MOUTH EVERY DAY   methotrexate (RHEUMATREX) 2.5 MG tablet Take 2.5 mg by mouth.   Multiple Minerals-Vitamins (CALCIUM CITRATE PLUS/MAGNESIUM) TABS Take 1 tablet by mouth daily.   Polyethyl Glycol-Propyl Glycol (SYSTANE OP) Apply to eye as needed.   methotrexate (RHEUMATREX) 10 MG tablet Take 2 tablets (20 mg total) by mouth once a week.   pravastatin (PRAVACHOL) 20 MG tablet Take 1 tablet (20 mg total) by mouth daily. Needs appointment for future refills (Patient not taking: Reported on 08/08/2022)   No facility-administered encounter  medications on file as of 08/08/2022.    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   Rheumatoid arthritis(714.0) 01/19/2007   Past Surgical History:  Procedure Laterality Date   ABDOMINAL  HYSTERECTOMY     APPENDECTOMY     CHOLECYSTECTOMY     NECK SURGERY     Family History  Problem Relation Age of Onset   Diabetes Father    Heart disease Father    Cancer Sister        breast    Heart disease Sister    Heart attack Brother    Heart attack Brother    Heart attack Brother    Social History   Socioeconomic History   Marital status: Married    Spouse name: Not on file   Number of children: Not on file   Years of education: Not on file   Highest education level: Not on file  Occupational History   Not on file  Tobacco Use   Smoking status: Never    Passive exposure: Never   Smokeless tobacco: Never  Vaping Use   Vaping Use: Never used  Substance and Sexual Activity   Alcohol use: No   Drug use: No   Sexual activity: Not Currently  Other Topics Concern   Not on file  Social History Narrative   Not on file   Social Determinants of Health   Financial Resource Strain: Low Risk  (08/08/2022)   Overall Financial Resource Strain (CARDIA)    Difficulty of Paying Living Expenses: Not hard at all  Food Insecurity: No Food Insecurity (08/08/2022)   Hunger Vital Sign    Worried About Running Out of Food in the Last Year: Never true    [place] in the Last Year: Never true  Transportation Needs: No Transportation Needs (08/08/2022)   PRAPARE - Hydrologist (Medical): No    Lack of Transportation (Non-Medical): No  Physical Activity: Insufficiently Active (08/08/2022)   Exercise Vital Sign    Days of Exercise per Week: 3 days    Minutes of Exercise per Session: 10 min  Stress: No Stress Concern Present (08/08/2022)   [place]    Feeling of Stress : Not at all  Social Connections: Moderately Isolated (07/31/2021)   Social Connection and Isolation Panel [NHANES]    Frequency of Communication with Friends and Family: Twice a week    Frequency of Social Gatherings with  Friends and Family: Twice a week    Attends Religious Services: More than 4 times per year    Active Member of [employer] or Organizations: No    Attends Archivist Meetings: Never    Marital Status: Widowed    Tobacco Counseling Counseling given: Not Answered   Clinical Intake:  Pre-visit preparation completed: Yes  Pain : No/denies pain     Nutritional Status: BMI of 19-24  Normal Nutritional Risks: None Diabetes: No  How often do you need to have someone help you when you read instructions, pamphlets, or other written materials from your doctor or pharmacy?: 1 - Never  Diabetic? no  Interpreter Needed?: No  Information entered by :: NAllen LPN   Activities of Daily Living    08/08/2022  9:33 AM  In your present state of health, do you have any difficulty performing the following activities:  Hearing? 0  Vision? 0  Difficulty concentrating or making decisions? 0  Walking or climbing stairs? 0  Dressing or bathing? 0  Doing errands, shopping? 1  Comment does not drive  Preparing Food and eating ? N  Using the Toilet? N  In the past six months, have you accidently leaked urine? N  Do you have problems with loss of bowel control? N  Managing your Medications? N  Managing your Finances? N  Housekeeping or managing your Housekeeping? N    Patient Care Team: Libby Maw, MD as PCP - General (Family Medicine)  Indicate any recent Medical Services you may have received from other than [facility] providers in the past year (date may be approximate).     Assessment:   This is a routine wellness examination for Zareah.  Hearing/Vision screen Vision Screening - Comments:: Regular eye exams, Dr. [facility]  Dietary issues and exercise activities discussed: Current Exercise Habits: Home exercise routine, Type of exercise: stretching, Time (Minutes): 10, Frequency (Times/Week): 3, Weekly Exercise (Minutes/Week): 30   Goals Addressed             This  Visit's Progress    Patient Stated       08/08/2022, no goals       Depression Screen    08/08/2022    9:33 AM 04/23/2022   10:13 AM 10/21/2021   10:42 AM 07/31/2021    9:55 AM 07/31/2021    9:52 AM 04/18/2021   10:47 AM 10/15/2020   10:24 AM  PHQ 2/9 Scores  PHQ - 2 Score 0 0 0 0 0 0 0    Fall Risk    08/08/2022    9:33 AM 04/23/2022   10:13 AM 10/21/2021   10:42 AM 07/31/2021    9:54 AM 04/18/2021   10:47 AM  Fall Risk   Falls in the past year? 0 0 0 0 0  Number falls in past yr: 0 0 0 0   Injury with Fall? 0   0   Risk for fall due to : Medication side effect      Follow up Falls prevention discussed;Education provided;Falls evaluation completed   Falls evaluation completed   Comment    uses cane     FALL RISK PREVENTION PERTAINING TO THE HOME:  Any stairs in or around the home? No  If so, are there any without handrails? N/a Home free of loose throw rugs in walkways, pet beds, electrical cords, etc? Yes  Adequate lighting in your home to reduce risk of falls? Yes   ASSISTIVE DEVICES UTILIZED TO PREVENT FALLS:  Life alert? No  Use of a cane, walker or w/c? Yes  Grab bars in the bathroom? Yes  Shower chair or bench in shower? No  Elevated toilet seat or a handicapped toilet? No   TIMED UP AND GO:  Was the test performed? No .       Cognitive Function:        08/08/2022    9:35 AM  6CIT Screen  What Year? 0 points  What month? 0 points  What time? 0 points  Count back from 20 0 points  Months in reverse 0 points  Repeat phrase 2 points  Total Score 2 points    Immunizations Immunization History  Administered Date(s) Administered   Fluad Quad(high Dose 65+) 05/06/2019, 04/18/2021   Influenza,inj,Quad PF,6+ Mos  07/06/2013   Pneumococcal Conjugate-13 07/06/2013   Pneumococcal Polysaccharide-23 04/09/2011, 06/21/2012   Tdap 04/09/2011    TDAP status: Due, Education has been provided regarding the importance of this vaccine. Advised may receive this  vaccine at local pharmacy or Health Dept. Aware to provide a copy of the vaccination record if obtained from local pharmacy or Health Dept. Verbalized acceptance and understanding.  Flu Vaccine status: Declined, Education has been provided regarding the importance of this vaccine but patient still declined. Advised may receive this vaccine at local pharmacy or Health Dept. Aware to provide a copy of the vaccination record if obtained from local pharmacy or Health Dept. Verbalized acceptance and understanding.  Pneumococcal vaccine status: Up to date  Covid-19 vaccine status: Declined, Education has been provided regarding the importance of this vaccine but patient still declined. Advised may receive this vaccine at local pharmacy or Health Dept.or vaccine clinic. Aware to provide a copy of the vaccination record if obtained from local pharmacy or Health Dept. Verbalized acceptance and understanding.  Qualifies for Shingles Vaccine? Yes   Zostavax completed No   Shingrix Completed?: No.    Education has been provided regarding the importance of this vaccine. Patient has been advised to call insurance company to determine out of pocket expense if they have not yet received this vaccine. Advised may also receive vaccine at local pharmacy or Health Dept. Verbalized acceptance and understanding.  Screening Tests Health Maintenance  Topic Date Due   DTaP/Tdap/Td (2 - Td or Tdap) 04/08/2021   Medicare Annual Wellness (AWV)  07/31/2022   Pneumonia Vaccine 97+ Years old  Completed   DEXA SCAN  Completed   HPV VACCINES  Aged Out   INFLUENZA VACCINE  Discontinued   MAMMOGRAM  Discontinued   COVID-19 Vaccine  Discontinued   Zoster Vaccines- Shingrix  Discontinued    Health Maintenance  Health Maintenance Due  Topic Date Due   DTaP/Tdap/Td (2 - Td or Tdap) 04/08/2021   Medicare Annual Wellness (AWV)  07/31/2022    Colorectal cancer screening: No longer required.   Mammogram status: No longer  required due to age.  Bone Density status: Completed 01/16/2014.   Lung Cancer Screening: (Low Dose CT Chest recommended if Age 10-80 years, 30 pack-year currently smoking OR have quit w/in 15years.) does not qualify.   Lung Cancer Screening Referral: no  Additional Screening:  Hepatitis C Screening: does not qualify;   Vision Screening: Recommended annual ophthalmology exams for early detection of glaucoma and other disorders of the eye. Is the patient up to date with their annual eye exam?  Yes  Who is the provider or what is the name of the office in which the patient attends annual eye exams? Dr. [facility] Deaner If pt is not established with a provider, would they like to be referred to a provider to establish care? No .   Dental Screening: Recommended annual dental exams for proper oral hygiene  Community Resource Referral / Chronic Care Management: CRR required this visit?  No   CCM required this visit?  No      Plan:     I have personally reviewed and noted the following in the patient's chart:   Medical and social history Use of alcohol, tobacco or illicit drugs  Current medications and supplements including opioid prescriptions. Patient is not currently taking opioid prescriptions. Functional ability and status Nutritional status Physical activity Advanced directives List of other physicians Hospitalizations, surgeries, and ER visits in previous 12 months Vitals Screenings to  include cognitive, depression, and falls Referrals and appointments  In addition, I have reviewed and discussed with patient certain preventive protocols, quality metrics, and best practice recommendations. A written personalized care plan for preventive services as well as general preventive health recommendations were provided to patient.     Kellie Simmering, LPN   03/08/9092   Nurse Notes: none  Due to this being a virtual visit, the after visit summary with patients personalized plan was  offered to patient via mail or my-chart. to pick up at office at next visit

## 2022-08-13 ENCOUNTER — Other Ambulatory Visit: Payer: Self-pay | Admitting: Physician Assistant

## 2022-08-13 NOTE — Telephone Encounter (Signed)
Next Visit: 09/03/2022  Last Visit: 05/16/2022  Last Fill: 05/16/2022  DX: Rheumatoid arthritis involving multiple sites with positive rheumatoid factor   Current Dose per office note 05/16/2022: rava 10 mg 1 tablet by mouth daily   Labs: 08/05/2022 Glucose 102, Creat. 1.13, GFR 48, Total Protein 8.3, Hgb 11.4, MCH 24.9, RDW 15.7, Neutro Abs 0.6, Lymphs Abs 8.4  Okay to refill Arava?

## 2022-08-20 ENCOUNTER — Ambulatory Visit: Payer: Medicare HMO | Admitting: Physician Assistant

## 2022-08-21 ENCOUNTER — Ambulatory Visit: Payer: Medicare HMO | Admitting: Physician Assistant

## 2022-08-21 NOTE — Progress Notes (Unsigned)
Office Visit Note  Patient: [name] [name]             Date of Birth: 1935/04/10           MRN: [id number]             PCP: Libby Maw, MD Referring: Libby Maw,* Visit Date: 09/03/2022 Occupation: '@GUAROCC'$ @  Subjective:  Medication monitoring   History of Present Illness: [name] [name] is a 88 y.o. female with history of seropositive rheumatoid arthritis, osteoarthritis, and DDD.  She is prescribed Arava 10 mg 1 tablet by mouth daily for rheumatoid arthritis and methotrexate weekly for LGL leukemia-prescribed by Dr. Marin Olp.  She is tolerating Arava without any side effects.  Patient reports that she ran out of her prescription for methotrexate so she has not taken it since her last office visit with Dr. Marin Olp on 08/05/2022.  She denies any signs or symptoms of a rheumatoid arthritis flare recently.  She is not currently experiencing joint pain or joint swelling.  She denies any morning stiffness or nocturnal pain.  She denies any difficulty with ADLs. She denies any recent or recurrent infections.  She denies any new medical conditions.   Activities of Daily Living:  Patient reports morning stiffness for 0  none .   Patient Denies nocturnal pain.  Difficulty dressing/grooming: Denies Difficulty climbing stairs: Denies Difficulty getting out of chair: Denies Difficulty using hands for taps, buttons, cutlery, and/or writing: Denies  Review of Systems  Constitutional:  Negative for fatigue.  HENT:  Negative for mouth sores and mouth dryness.   Eyes:  Negative for dryness.  Respiratory:  Negative for shortness of breath.   Cardiovascular:  Negative for chest pain and palpitations.  Gastrointestinal:  Negative for blood in stool, constipation and diarrhea.  Endocrine: Negative for increased urination.  Genitourinary:  Negative for involuntary urination.  Musculoskeletal:  Negative for joint pain, gait problem, joint pain, joint swelling, myalgias, muscle  weakness, morning stiffness, muscle tenderness and myalgias.  Skin:  Negative for color change, rash, hair loss and sensitivity to sunlight.  Allergic/Immunologic: Negative for susceptible to infections.  Neurological:  Negative for dizziness and headaches.  Hematological:  Negative for swollen glands.  Psychiatric/Behavioral:  Negative for depressed mood and sleep disturbance. The patient is not nervous/anxious.     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   Goals of care, counseling/discussion 08/06/2021   Need for influenza vaccination 04/18/2021   Iron deficiency anemia 11/20/2020   Lymphocytosis 10/16/2020   Neutropenia ([place]) 10/16/2020   Paroxysmal supraventricular tachycardia 10/15/2020   Elevated LDL cholesterol level 01/12/2020   DDD (degenerative disc disease), cervical 07/13/2018   Chronic kidney disease (CKD) stage G3b 01/21/2017   History of hypothyroidism 01/20/2017   History of congestive heart failure 08/26/2016   Osteopenia of multiple sites 08/25/2016   DDD cervical spine 08/25/2016   Primary osteoarthritis of both hands 08/25/2016   High risk medication use 06/11/2016   CHF, acute ([place]) 02/01/2013   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   Rheumatoid  arthritis(714.0) 01/19/2007    Family History  Problem Relation Age of Onset   Diabetes Father    Heart disease Father    Cancer Sister        breast    Heart disease Sister    Heart attack Brother    Heart attack Brother     Heart attack Brother    Past Surgical History:  Procedure Laterality Date   ABDOMINAL HYSTERECTOMY     APPENDECTOMY     CHOLECYSTECTOMY     NECK SURGERY     Social History   Social History Narrative   Not on file   Immunization History  Administered Date(s) Administered   Fluad Quad(high Dose 65+) 05/06/2019, 04/18/2021   Influenza,inj,Quad PF,6+ Mos 07/06/2013   Pneumococcal Conjugate-13 07/06/2013   Pneumococcal Polysaccharide-23 04/09/2011, 06/21/2012   Tdap 04/09/2011     Objective: Vital Signs: BP 131/66 (BP Location: Left Arm, Patient Position: Sitting, Cuff Size: Small)   Pulse 83   Resp 16   Ht 5' 1.5" (1.562 m)   Wt 119 lb (54 kg)   BMI 22.12 kg/m    Physical Exam Vitals and nursing note reviewed.  Constitutional:      Appearance: She is well-developed.  HENT:     Head: Normocephalic and atraumatic.  Eyes:     Conjunctiva/sclera: Conjunctivae normal.  Cardiovascular:     Rate and Rhythm: Normal rate and regular rhythm.     Heart sounds: Normal heart sounds.  Pulmonary:     Effort: Pulmonary effort is normal.     Breath sounds: Normal breath sounds.  Abdominal:     General: Bowel sounds are normal.     Palpations: Abdomen is soft.  Musculoskeletal:     Cervical back: Normal range of motion.  Skin:    General: Skin is warm and dry.     Capillary Refill: Capillary refill takes less than 2 seconds.  Neurological:     Mental Status: She is alert and oriented to person, place, and time.  Psychiatric:        Behavior: Behavior normal.      Musculoskeletal Exam: C-spine has slightly limited range of motion without rotation.  No midline spinal tenderness or SI joint tenderness.  Shoulder joints, elbow joints, wrist joints, MCPs, PIPs, DIPs have good range of motion with no synovitis.  Complete fist formation bilaterally.  Hip joints have good range of motion with no groin pain.  Knee joints have good range of motion with no warmth or effusion.  Ankle  joints have good range of motion with no tenderness or joint swelling.  CDAI Exam: CDAI Score: -- Patient Global: 1 mm; Provider Global: 1 mm Swollen: --; Tender: -- Joint Exam 09/03/2022   No joint exam has been documented for this visit   There is currently no information documented on the homunculus. Go to the Rheumatology activity and complete the homunculus joint exam.  Investigation: No additional findings.  Imaging: No results found.  Recent Labs: Lab Results  Component Value Date   WBC 9.7 08/05/2022   HGB 11.4 (L) 08/05/2022   PLT 188 08/05/2022   NA 139 08/05/2022   K 4.6 08/05/2022   CL 103 08/05/2022   CO2 26 08/05/2022   GLUCOSE 102 (H) 08/05/2022   BUN 19 08/05/2022   CREATININE 1.13 (H) 08/05/2022   BILITOT 0.4 08/05/2022   ALKPHOS 67 08/05/2022   AST 22 08/05/2022   ALT 11 08/05/2022   PROT 8.3 (H) 08/05/2022  ALBUMIN 4.0 08/05/2022   CALCIUM 9.3 08/05/2022   GFRAA 50 (L) 08/09/2020    Speciality Comments: PLQ Eye Exam: 10/04/2019 @ [facility]  Prior therapy: Methotrexate (lung injury) NOT CURRENTLY ON PLQ  Procedures:  No procedures performed Allergies: Tramadol and Codeine phosphate   Assessment / Plan:     Visit Diagnoses: Rheumatoid arthritis involving multiple sites with positive rheumatoid factor ([place]): She has no joint tenderness or synovitis on examination today.  She has not had any signs or symptoms of a rheumatoid arthritis flare.  No morning stiffness or nocturnal pain.  No difficulty with ADLs.  She has clinically been doing well taking Arava 10 mg 1 tablet by mouth daily.  She is prescribed MTX 20 mg weekly by Dr. Arelia Sneddon for management of LGL leukemia, but she ran out of her prescription for methotrexate earlier this month.    Overall lab work has been stable and she remains asymptomatic.  Patient was encouraged to reach out to Dr. Marin Olp for a refill of methotrexate.  She will remain on arava as prescribed.  She will follow up  in 4 months or sooner if needed.    High risk medication use - Arava 10 mg 1 tablet by mouth daily for rheumatoid arthritis and methotrexate '20mg'$  weekly for LGL leukemia-prescribed by Dr. Marin Olp. CBC and CMP updated on 08/05/22.  Her next lab work will be due in April and every 3 months.  No recent or recurrent infections.  Discussed the importance of holding MTX and arava if she develops signs or symptoms of an infection and to resume once the infection has completely cleared.  No new medical conditions.    Primary osteoarthritis of both hands: No tenderness or synovitis.  Complete fist formation bilaterally.  DDD (degenerative disc disease), cervical: Slightly limited range of motion with lateral rotation.  No discomfort at this time.  No symptoms of radiculopathy.  Trochanteric bursitis, right hip - Resolved.  Osteopenia of multiple sites - DEXA order remains in place.  Chronic kidney disease (CKD) stage G3b: Creatinine was 1.13 and GFR was 48--stable on 08/05/2022. Avoid NSAID use.  Large granular lymphocytic leukemia ([facility]) - Followed by Dr. Marin Olp.  Reviewed office visit note from 08/05/22-stable, asymptomatic.  Plan to keep her on Methotrexate.  She has a follow up appointment scheduled on 09/19/22.   History of iron deficiency anemia: Followed by Dr. Marin Olp.  Other medical conditions are listed as follows:  History of congestive heart failure  Essential hypertension: Blood pressure was 151/67 today in the office.  Blood pressure was rechecked by the patient leaving.  Advised patient to monitor blood pressure closely.  Dyslipidemia  Lymphocytosis  History of hypothyroidism  Orders: No orders of the defined types were placed in this encounter.  No orders of the defined types were placed in this encounter.    Follow-Up Instructions: Return in about 4 months (around 01/02/2023) for Rheumatoid arthritis, Osteoarthritis, DDD.   Ofilia Neas, PA-C  Note - This record has been  created using Dragon software.  Chart creation errors have been sought, but may not always  have been located. Such creation errors do not reflect on  the standard of medical care.

## 2022-09-03 ENCOUNTER — Encounter: Payer: Self-pay | Admitting: Physician Assistant

## 2022-09-03 ENCOUNTER — Ambulatory Visit: Payer: Medicare HMO | Attending: Physician Assistant | Admitting: Physician Assistant

## 2022-09-03 VITALS — BP 131/66 | HR 83 | Resp 16 | Ht 61.5 in | Wt 119.0 lb

## 2022-09-03 DIAGNOSIS — Z862 Personal history of diseases of the blood and blood-forming organs and certain disorders involving the immune mechanism: Secondary | ICD-10-CM

## 2022-09-03 DIAGNOSIS — Z79899 Other long term (current) drug therapy: Secondary | ICD-10-CM

## 2022-09-03 DIAGNOSIS — M19041 Primary osteoarthritis, right hand: Secondary | ICD-10-CM

## 2022-09-03 DIAGNOSIS — D72820 Lymphocytosis (symptomatic): Secondary | ICD-10-CM

## 2022-09-03 DIAGNOSIS — M8589 Other specified disorders of bone density and structure, multiple sites: Secondary | ICD-10-CM | POA: Diagnosis not present

## 2022-09-03 DIAGNOSIS — M5030 Other cervical disc degeneration, unspecified cervical region: Secondary | ICD-10-CM

## 2022-09-03 DIAGNOSIS — C91Z0 Other lymphoid leukemia not having achieved remission: Secondary | ICD-10-CM

## 2022-09-03 DIAGNOSIS — E785 Hyperlipidemia, unspecified: Secondary | ICD-10-CM | POA: Diagnosis not present

## 2022-09-03 DIAGNOSIS — M19042 Primary osteoarthritis, left hand: Secondary | ICD-10-CM

## 2022-09-03 DIAGNOSIS — Z8679 Personal history of other diseases of the circulatory system: Secondary | ICD-10-CM

## 2022-09-03 DIAGNOSIS — M7061 Trochanteric bursitis, right hip: Secondary | ICD-10-CM

## 2022-09-03 DIAGNOSIS — M0579 Rheumatoid arthritis with rheumatoid factor of multiple sites without organ or systems involvement: Secondary | ICD-10-CM

## 2022-09-03 DIAGNOSIS — I10 Essential (primary) hypertension: Secondary | ICD-10-CM

## 2022-09-03 DIAGNOSIS — N1832 Chronic kidney disease, stage 3b: Secondary | ICD-10-CM | POA: Diagnosis not present

## 2022-09-03 DIAGNOSIS — Z8639 Personal history of other endocrine, nutritional and metabolic disease: Secondary | ICD-10-CM

## 2022-09-03 NOTE — Patient Instructions (Signed)
Standing Labs We placed an order today for your standing lab work.   Please have your standing labs drawn in April and every 3 months   Please have your labs drawn 2 weeks prior to your appointment so that the provider can discuss your lab results at your appointment.  Please note that you may see your imaging and lab results in [facility] before we have reviewed them. We will contact you once all results are reviewed. Please allow our office up to 72 hours to thoroughly review all of the results before contacting the office for clarification of your results.  Lab hours are:   Monday through Thursday from 8:00 am -12:30 pm and 1:00 pm-5:00 pm and Friday from 8:00 am-12:00 pm.  Please be advised, all patients with office appointments requiring lab work will take precedent over walk-in lab work.   Labs are drawn by [facility]. Please bring your co-pay at the time of your lab draw.  You may receive a bill from [facility] for your lab work.  Please note if you are on Hydroxychloroquine and and an order has been placed for a Hydroxychloroquine level, you will need to have it drawn 4 hours or more after your last dose.  If you wish to have your labs drawn at another location, please call the office 24 hours in advance so we can fax the orders.  The office is located at [address] [place], [place], [place] [address] No appointment is necessary.    If you have any questions regarding directions or hours of operation,  please call [phone].   As a reminder, please drink plenty of water prior to coming for your lab work. Thanks!

## 2022-09-19 ENCOUNTER — Inpatient Hospital Stay: Payer: Medicare HMO

## 2022-09-19 ENCOUNTER — Inpatient Hospital Stay: Payer: Medicare HMO | Admitting: Hematology & Oncology

## 2022-09-24 ENCOUNTER — Encounter: Payer: Self-pay | Admitting: Family Medicine

## 2022-09-24 ENCOUNTER — Emergency Department (HOSPITAL_COMMUNITY): Payer: Medicare HMO

## 2022-09-24 ENCOUNTER — Telehealth: Payer: Self-pay | Admitting: Family Medicine

## 2022-09-24 ENCOUNTER — Ambulatory Visit (INDEPENDENT_AMBULATORY_CARE_PROVIDER_SITE_OTHER): Payer: Medicare HMO | Admitting: Family Medicine

## 2022-09-24 ENCOUNTER — Inpatient Hospital Stay (HOSPITAL_COMMUNITY)
Admission: EM | Admit: 2022-09-24 | Discharge: 2022-09-26 | DRG: 682 | Disposition: A | Discharge status: Home / Self Care | Payer: Medicare HMO | Attending: Internal Medicine | Admitting: Internal Medicine

## 2022-09-24 ENCOUNTER — Other Ambulatory Visit: Payer: Self-pay

## 2022-09-24 ENCOUNTER — Encounter (HOSPITAL_COMMUNITY): Payer: Self-pay

## 2022-09-24 VITALS — BP 124/82 | HR 74 | Temp 97.5°F | Ht 61.5 in | Wt 113.2 lb

## 2022-09-24 DIAGNOSIS — M0579 Rheumatoid arthritis with rheumatoid factor of multiple sites without organ or systems involvement: Secondary | ICD-10-CM | POA: Diagnosis not present

## 2022-09-24 DIAGNOSIS — R197 Diarrhea, unspecified: Secondary | ICD-10-CM | POA: Diagnosis present

## 2022-09-24 DIAGNOSIS — Z9049 Acquired absence of other specified parts of digestive tract: Secondary | ICD-10-CM | POA: Diagnosis not present

## 2022-09-24 DIAGNOSIS — N179 Acute kidney failure, unspecified: Secondary | ICD-10-CM | POA: Diagnosis not present

## 2022-09-24 DIAGNOSIS — Z90710 Acquired absence of both cervix and uterus: Secondary | ICD-10-CM | POA: Diagnosis not present

## 2022-09-24 DIAGNOSIS — M069 Rheumatoid arthritis, unspecified: Secondary | ICD-10-CM | POA: Diagnosis not present

## 2022-09-24 DIAGNOSIS — Z8249 Family history of ischemic heart disease and other diseases of the circulatory system: Secondary | ICD-10-CM | POA: Diagnosis not present

## 2022-09-24 DIAGNOSIS — R419 Unspecified symptoms and signs involving cognitive functions and awareness: Secondary | ICD-10-CM | POA: Insufficient documentation

## 2022-09-24 DIAGNOSIS — Z79899 Other long term (current) drug therapy: Secondary | ICD-10-CM | POA: Diagnosis not present

## 2022-09-24 DIAGNOSIS — Y92009 Unspecified place in unspecified non-institutional (private) residence as the place of occurrence of the external cause: Secondary | ICD-10-CM

## 2022-09-24 DIAGNOSIS — Z91148 Patient's other noncompliance with medication regimen for other reason: Secondary | ICD-10-CM

## 2022-09-24 DIAGNOSIS — Z833 Family history of diabetes mellitus: Secondary | ICD-10-CM | POA: Diagnosis not present

## 2022-09-24 DIAGNOSIS — E43 Unspecified severe protein-calorie malnutrition: Secondary | ICD-10-CM | POA: Diagnosis not present

## 2022-09-24 DIAGNOSIS — I10 Essential (primary) hypertension: Secondary | ICD-10-CM | POA: Diagnosis not present

## 2022-09-24 DIAGNOSIS — J984 Other disorders of lung: Secondary | ICD-10-CM | POA: Diagnosis not present

## 2022-09-24 DIAGNOSIS — R413 Other amnesia: Secondary | ICD-10-CM | POA: Diagnosis not present

## 2022-09-24 DIAGNOSIS — R2681 Unsteadiness on feet: Secondary | ICD-10-CM

## 2022-09-24 DIAGNOSIS — D709 Neutropenia, unspecified: Secondary | ICD-10-CM | POA: Diagnosis not present

## 2022-09-24 DIAGNOSIS — Z803 Family history of malignant neoplasm of breast: Secondary | ICD-10-CM | POA: Diagnosis not present

## 2022-09-24 DIAGNOSIS — Z7984 Long term (current) use of oral hypoglycemic drugs: Secondary | ICD-10-CM

## 2022-09-24 DIAGNOSIS — D509 Iron deficiency anemia, unspecified: Secondary | ICD-10-CM | POA: Diagnosis present

## 2022-09-24 DIAGNOSIS — E860 Dehydration: Secondary | ICD-10-CM | POA: Diagnosis present

## 2022-09-24 DIAGNOSIS — C91Z0 Other lymphoid leukemia not having achieved remission: Secondary | ICD-10-CM | POA: Diagnosis not present

## 2022-09-24 DIAGNOSIS — E785 Hyperlipidemia, unspecified: Secondary | ICD-10-CM | POA: Diagnosis not present

## 2022-09-24 DIAGNOSIS — T381X6A Underdosing of thyroid hormones and substitutes, initial encounter: Secondary | ICD-10-CM | POA: Diagnosis present

## 2022-09-24 DIAGNOSIS — N1831 Chronic kidney disease, stage 3a: Secondary | ICD-10-CM | POA: Diagnosis present

## 2022-09-24 DIAGNOSIS — K2960 Other gastritis without bleeding: Secondary | ICD-10-CM

## 2022-09-24 DIAGNOSIS — Z6821 Body mass index (BMI) 21.0-21.9, adult: Secondary | ICD-10-CM

## 2022-09-24 DIAGNOSIS — E039 Hypothyroidism, unspecified: Secondary | ICD-10-CM | POA: Diagnosis not present

## 2022-09-24 DIAGNOSIS — Z885 Allergy status to narcotic agent status: Secondary | ICD-10-CM | POA: Diagnosis not present

## 2022-09-24 DIAGNOSIS — K769 Liver disease, unspecified: Secondary | ICD-10-CM | POA: Diagnosis not present

## 2022-09-24 DIAGNOSIS — R911 Solitary pulmonary nodule: Secondary | ICD-10-CM

## 2022-09-24 DIAGNOSIS — D630 Anemia in neoplastic disease: Secondary | ICD-10-CM | POA: Diagnosis present

## 2022-09-24 DIAGNOSIS — K219 Gastro-esophageal reflux disease without esophagitis: Secondary | ICD-10-CM | POA: Diagnosis present

## 2022-09-24 DIAGNOSIS — I129 Hypertensive chronic kidney disease with stage 1 through stage 4 chronic kidney disease, or unspecified chronic kidney disease: Secondary | ICD-10-CM | POA: Diagnosis not present

## 2022-09-24 DIAGNOSIS — Z66 Do not resuscitate: Secondary | ICD-10-CM | POA: Diagnosis not present

## 2022-09-24 DIAGNOSIS — R634 Abnormal weight loss: Secondary | ICD-10-CM | POA: Insufficient documentation

## 2022-09-24 LAB — COMPREHENSIVE METABOLIC PANEL
ALT: 18 U/L (ref 0–44)
AST: 25 U/L (ref 15–41)
Albumin: 3.4 g/dL — ABNORMAL LOW (ref 3.5–5.0)
Alkaline Phosphatase: 52 U/L (ref 38–126)
Anion gap: 9 (ref 5–15)
BUN: 49 mg/dL — ABNORMAL HIGH (ref 8–23)
CO2: 24 mmol/L (ref 22–32)
Calcium: 8.9 mg/dL (ref 8.9–10.3)
Chloride: 105 mmol/L (ref 98–111)
Creatinine, Ser: 2.16 mg/dL — ABNORMAL HIGH (ref 0.44–1.00)
GFR, Estimated: 22 mL/min — ABNORMAL LOW (ref >60)
Glucose, Bld: 110 mg/dL — ABNORMAL HIGH (ref 70–99)
Potassium: 4.4 mmol/L (ref 3.5–5.1)
Sodium: 138 mmol/L (ref 135–145)
Total Bilirubin: 0.7 mg/dL (ref 0.3–1.2)
Total Protein: 7.4 g/dL (ref 6.5–8.1)

## 2022-09-24 LAB — URINALYSIS, COMPLETE (UACMP) WITH MICROSCOPIC
Bacteria, UA: NONE SEEN
Bilirubin Urine: NEGATIVE
Glucose, UA: NEGATIVE mg/dL
Hgb urine dipstick: NEGATIVE
Ketones, ur: NEGATIVE mg/dL
Leukocytes,Ua: NEGATIVE
Nitrite: NEGATIVE
Protein, ur: NEGATIVE mg/dL
Specific Gravity, Urine: 1.014 (ref 1.005–1.030)
pH: 5 (ref 5.0–8.0)

## 2022-09-24 LAB — CBC WITH DIFFERENTIAL/PLATELET
Abs Immature Granulocytes: 0 10*3/uL (ref 0.00–0.07)
Basophils Absolute: 0 10*3/uL (ref 0.0–0.1)
Basophils Relative: 0 %
Eosinophils Absolute: 0 10*3/uL (ref 0.0–0.5)
Eosinophils Relative: 0 %
HCT: 32.3 % — ABNORMAL LOW (ref 36.0–46.0)
Hemoglobin: 9.8 g/dL — ABNORMAL LOW (ref 12.0–15.0)
Immature Granulocytes: 0 %
Lymphocytes Relative: 78 %
Lymphs Abs: 5.7 10*3/uL — ABNORMAL HIGH (ref 0.7–4.0)
MCH: 24.9 pg — ABNORMAL LOW (ref 26.0–34.0)
MCHC: 30.3 g/dL (ref 30.0–36.0)
MCV: 82.2 fL (ref 80.0–100.0)
Monocytes Absolute: 1.4 10*3/uL — ABNORMAL HIGH (ref 0.1–1.0)
Monocytes Relative: 20 %
Neutro Abs: 0.1 10*3/uL — CL (ref 1.7–7.7)
Neutrophils Relative %: 2 %
Platelets: 346 10*3/uL (ref 150–400)
RBC: 3.93 MIL/uL (ref 3.87–5.11)
RDW: 16.1 % — ABNORMAL HIGH (ref 11.5–15.5)
WBC: 7.2 10*3/uL (ref 4.0–10.5)
nRBC: 0 % (ref 0.0–0.2)

## 2022-09-24 LAB — SODIUM, URINE, RANDOM: Sodium, Ur: 31 mmol/L

## 2022-09-24 LAB — POCT INFLUENZA A/B
Influenza A, POC: NEGATIVE
Influenza B, POC: NEGATIVE

## 2022-09-24 LAB — CREATININE, URINE, RANDOM: Creatinine, Urine: 110 mg/dL

## 2022-09-24 LAB — TSH: TSH: 43.15 u[IU]/mL — ABNORMAL HIGH (ref 0.350–4.500)

## 2022-09-24 LAB — LIPASE, BLOOD: Lipase: 42 U/L (ref 11–51)

## 2022-09-24 LAB — POC COVID19 BINAXNOW: SARS Coronavirus 2 Ag: NEGATIVE

## 2022-09-24 MED ORDER — LEVOTHYROXINE SODIUM 50 MCG PO TABS
75.0000 ug | ORAL_TABLET | Freq: Once | ORAL | Status: AC
  Administered 2022-09-24: 75 ug via ORAL
  Filled 2022-09-24: qty 1

## 2022-09-24 MED ORDER — LEFLUNOMIDE 10 MG PO TABS
10.0000 mg | ORAL_TABLET | Freq: Every day | ORAL | Status: DC
  Administered 2022-09-24 – 2022-09-26 (×3): 10 mg via ORAL
  Filled 2022-09-24 (×3): qty 1

## 2022-09-24 MED ORDER — FAMOTIDINE IN NACL 20-0.9 MG/50ML-% IV SOLN
20.0000 mg | Freq: Once | INTRAVENOUS | Status: AC
  Administered 2022-09-24: 20 mg via INTRAVENOUS
  Filled 2022-09-24: qty 50

## 2022-09-24 MED ORDER — ACETAMINOPHEN 650 MG RE SUPP: 650.0000 mg | Freq: Four times a day (QID) | RECTAL | Status: DC | PRN

## 2022-09-24 MED ORDER — HEPARIN SODIUM (PORCINE) 5000 UNIT/ML IJ SOLN
5000.0000 [IU] | Freq: Three times a day (TID) | INTRAMUSCULAR | Status: DC
  Administered 2022-09-24 – 2022-09-26 (×5): 5000 [IU] via SUBCUTANEOUS
  Filled 2022-09-24 (×5): qty 1

## 2022-09-24 MED ORDER — LEVOTHYROXINE SODIUM 50 MCG PO TABS
75.0000 ug | ORAL_TABLET | Freq: Every day | ORAL | Status: DC
  Administered 2022-09-25 – 2022-09-26 (×2): 75 ug via ORAL
  Filled 2022-09-24 (×2): qty 1

## 2022-09-24 MED ORDER — SODIUM CHLORIDE 0.9 % IV SOLN: INTRAVENOUS | Status: AC

## 2022-09-24 MED ORDER — SODIUM CHLORIDE 0.9 % IV BOLUS
1000.0000 mL | Freq: Once | INTRAVENOUS | Status: AC
  Administered 2022-09-24: 1000 mL via INTRAVENOUS

## 2022-09-24 MED ORDER — ACETAMINOPHEN 325 MG PO TABS: 650.0000 mg | ORAL_TABLET | Freq: Four times a day (QID) | ORAL | Status: DC | PRN

## 2022-09-24 NOTE — H&P (Signed)
History and Physical    [name] [name] [id number] DOB: 1935/03/24 DOA: 09/24/2022  PCP: Libby Maw, MD   Patient coming from: Home   Chief Complaint: Diarrhea, dehydration   HPI: [name] [name] is a pleasant 88 y.o. female with medical history significant for rheumatoid arthritis, large granular lymphocytic leukemia, hypertension, hypothyroidism, and CKD 3A who presents to the emergency department with concern for dehydration after recent diarrhea.  Patient had felt generally unwell on 09/17/2022, had severe diarrhea throughout the day the following day, and is no longer having diarrhea, but continues to be fatigued with decreased appetite.  She denies any abdominal pain, nausea, vomiting, or fevers.  Patient lives alone and is typically fully independent but has had some memory loss and intermittent confusion developing over months; this is being evaluated by her PCP.  She has not been taking Synthroid in a month or more, suspected to be due to pharmacy error per the patient's son.  ED Course: Upon arrival to the ED, patient is found to be afebrile and saturating well on room air with normal heart rate and stable blood pressure.  Labs are most notable for BUN 49, creatinine 2.16, hemoglobin 9.8, ANC 144, and TSH 43.150.  Patient was given 1 L normal saline, Pepcid, and Synthroid in the ED.  Review of Systems:  All other systems reviewed and apart from HPI, are negative.  Past Medical History:  Diagnosis Date   Goals of care, counseling/discussion 08/06/2021   HYPERLIPIDEMIA 01/19/2007   HYPERTENSION 01/19/2007   HYPOTHYROIDISM 01/19/2007   Large granular lymphocytic leukemia ([place]) 08/06/2021   Rheumatoid arthritis(714.0) 01/19/2007    Past Surgical History:  Procedure Laterality Date   ABDOMINAL HYSTERECTOMY     APPENDECTOMY     CHOLECYSTECTOMY     NECK SURGERY      Social History:   reports that she has never smoked. She has never been exposed to tobacco smoke.  She has never used smokeless tobacco. She reports that she does not drink alcohol and does not use drugs.  Allergies  Allergen Reactions   Tramadol Nausea Only   Codeine Phosphate Nausea Only    REACTION: unspecified    Family History  Problem Relation Age of Onset   Diabetes Father    Heart disease Father    Cancer Sister        breast    Heart disease Sister    Heart attack Brother    Heart attack Brother    Heart attack Brother      Prior to Admission medications   Medication Sig Start Date End Date Taking? Authorizing Provider  acetaminophen (TYLENOL) 650 MG CR tablet Take 650 mg by mouth every 8 (eight) hours as needed for pain.   Yes [provider]  benazepril (LOTENSIN) 5 MG tablet TAKE 2 TABLETS (10MG TOTAL) BY MOUTH EVERY DAY Patient taking differently: Take 10 mg by mouth daily. 03/25/22  Yes Libby Maw, MD  JANUVIA 25 MG tablet TAKE 1 TABLET (25 MG TOTAL) BY MOUTH DAILY. Patient taking differently: Take 25 mg by mouth daily. 08/04/22  Yes Libby Maw, MD  leflunomide (ARAVA) 10 MG tablet TAKE 1 TABLET BY MOUTH EVERY DAY 08/13/22  Yes Ofilia Neas, PA-C  levothyroxine (SYNTHROID) 75 MCG tablet TAKE 1 TABLET BY MOUTH EVERY DAY Patient taking differently: Take 75 mcg by mouth daily before breakfast. 01/20/22  Yes Libby Maw, MD  Multiple Minerals-Vitamins (CALCIUM CITRATE PLUS/MAGNESIUM) TABS Take 1 tablet by mouth  daily.   Yes [provider]  Polyethyl Glycol-Propyl Glycol (SYSTANE OP) Apply to eye as needed.   Yes [provider]  methotrexate (RHEUMATREX) 10 MG tablet Take 2 tablets (20 mg total) by mouth once a week. Patient not taking: Reported on 09/24/2022 04/29/22   Volanda Napoleon, MD  methotrexate (RHEUMATREX) 2.5 MG tablet Take 5 mg by mouth. Monday Wednesday friday 08/04/22   [provider]  pravastatin (PRAVACHOL) 20 MG tablet Take 1 tablet (20 mg total) by mouth daily. Needs appointment for  future refills Patient not taking: Reported on 08/08/2022 04/24/22   Libby Maw, MD    Physical Exam: Vitals:   09/24/22 1650 09/24/22 1805 09/24/22 2000  BP: 121/61 (!) 131/59 (!) 107/46  Pulse: 99 97 91  Resp: 20 16 18  $ Temp: 97.9 F (36.6 C) 98.9 F (37.2 C)   TempSrc:  Oral   SpO2: 100% 100% 100%    Constitutional: NAD, calm  Eyes: PERTLA, lids and conjunctivae normal ENMT: Mucous membranes are moist. Posterior pharynx clear of any exudate or lesions.   Neck: supple, no masses  Respiratory: no wheezing, no crackles. No accessory muscle use.  Cardiovascular: S1 & S2 heard, regular rate and rhythm. No extremity edema.   Abdomen: No distension, no tenderness, soft. Bowel sounds active.  Musculoskeletal: no clubbing / cyanosis. No joint deformity upper and lower extremities.   Skin: no significant rashes, lesions, ulcers. Warm, dry, well-perfused. Neurologic: CN 2-12 grossly intact. Moving all extremities. Alert and oriented to person, place, and situation.  Psychiatric: Pleasant. Cooperative.    Labs and Imaging on Admission: I have personally reviewed following labs and imaging studies  CBC: Recent Labs  Lab 09/24/22 1739  WBC 7.2  NEUTROABS 0.1*  HGB 9.8*  HCT 32.3*  MCV 82.2  PLT 123456   Basic Metabolic Panel: Recent Labs  Lab 09/24/22 1739  NA 138  K 4.4  CL 105  CO2 24  GLUCOSE 110*  BUN 49*  CREATININE 2.16*  CALCIUM 8.9   GFR: Estimated Creatinine Clearance: 14.7 mL/min (A) (by C-G formula based on SCr of 2.16 mg/dL (H)). Liver Function Tests: Recent Labs  Lab 09/24/22 1739  AST 25  ALT 18  ALKPHOS 52  BILITOT 0.7  PROT 7.4  ALBUMIN 3.4*   Recent Labs  Lab 09/24/22 1739  LIPASE 42   No results for input(s): "AMMONIA" in the last 168 hours. Coagulation Profile: No results for input(s): "INR", "PROTIME" in the last 168 hours. Cardiac Enzymes: No results for input(s): "CKTOTAL", "CKMB", "CKMBINDEX", "TROPONINI" in the last 168  hours. BNP (last 3 results) No results for input(s): "PROBNP" in the last 8760 hours. HbA1C: No results for input(s): "HGBA1C" in the last 72 hours. CBG: No results for input(s): "GLUCAP" in the last 168 hours. Lipid Profile: No results for input(s): "CHOL", "HDL", "LDLCALC", "TRIG", "CHOLHDL", "LDLDIRECT" in the last 72 hours. Thyroid Function Tests: Recent Labs    09/24/22 1739  TSH 43.150*   Anemia Panel: No results for input(s): "VITAMINB12", "FOLATE", "FERRITIN", "TIBC", "IRON", "RETICCTPCT" in the last 72 hours. Urine analysis:    Component Value Date/Time   COLORURINE YELLOW 04/23/2022 1104   APPEARANCEUR CLEAR 04/23/2022 1104   LABSPEC 1.020 04/23/2022 1104   PHURINE 6.0 04/23/2022 1104   GLUCOSEU NEGATIVE 04/23/2022 1104   HGBUR NEGATIVE 04/23/2022 1104   BILIRUBINUR NEGATIVE 04/23/2022 1104   KETONESUR NEGATIVE 04/23/2022 1104   PROTEINUR NEGATIVE 02/03/2019 1129   UROBILINOGEN 0.2 04/23/2022 1104  NITRITE NEGATIVE 04/23/2022 1104   LEUKOCYTESUR TRACE (A) 04/23/2022 1104   Sepsis Labs: @LABRCNTIP$ (procalcitonin:4,lacticidven:4) )No results found for this or any previous visit (from the past 240 hour(s)).   Radiological Exams on Admission: CT CHEST ABDOMEN PELVIS WO CONTRAST  Result Date: 09/24/2022 CLINICAL DATA:  Groin lymphadenopathy. Weakness. Weight loss. History of leukemia. Diarrhea. * Tracking Code: BO * EXAM: CT CHEST, ABDOMEN AND PELVIS WITHOUT CONTRAST TECHNIQUE: Multidetector CT imaging of the chest, abdomen and pelvis was performed following the standard protocol without IV contrast. RADIATION DOSE REDUCTION: This exam was performed according to the departmental dose-optimization program which includes automated exposure control, adjustment of the mA and/or kV according to patient size and/or use of iterative reconstruction technique. COMPARISON:  No comparison CTs FINDINGS: CT CHEST FINDINGS Cardiovascular: Aortic atherosclerosis. Tortuous thoracic  aorta. Mild cardiomegaly, without pericardial effusion. Multivessel coronary artery atherosclerosis. Aortic valve calcifications are nonspecific in this age group. Mediastinum/Nodes: No axillary or subpectoral adenopathy. No mediastinal or hilar adenopathy, given limitations of unenhanced CT. Upper esophageal fluid level on 23/2. Lungs/Pleura: No pleural fluid. Mild biapical pleuroparenchymal scarring. Mild bibasilar scarring. Left apical 4 mm pulmonary nodule on 35/6 is solid. A vague 3 mm right upper lobe pulmonary nodule on 39/6 may be subsolid. Musculoskeletal: Cervical spine fixation. CT ABDOMEN PELVIS FINDINGS Hepatobiliary: Scattered hepatic well-circumscribed low-density lesions of up to 11 mm are favored to represent cysts. Cholecystectomy, without biliary ductal dilatation. Pancreas: Normal, without mass or ductal dilatation. Spleen: Normal in size, without focal abnormality. Adrenals/Urinary Tract: Normal adrenal glands. No renal calculi or hydronephrosis. No bladder calculi. Stomach/Bowel: Proximal gastric underdistention. Normal colon and terminal ileum. Appendix not visualized. Normal small bowel. Vascular/Lymphatic: Aortic atherosclerosis. No abdominopelvic adenopathy. Reproductive: Hysterectomy.  No adnexal mass. Other: No significant free fluid. No free intraperitoneal air. Calcified left abdominal wall nodule of 1.6 cm is nonspecific. Musculoskeletal: Mild degenerative changes of both hips. Lumbosacral spondylosis. IMPRESSION: 1. No evidence of adenopathy, including within the groins. 2. Mildly decreased sensitivity exam secondary to noncontrast technique. 3.  No acute findings. 4. Tiny bilateral upper lobe pulmonary nodules. No follow-up needed if patient is low-risk. Non-contrast chest CT can be considered in 12 months if patient is high-risk, nodule is upper lobe, and/or suspicious in morphology. This recommendation follows the consensus statement: Guidelines for Management of Incidental  Pulmonary Nodules Detected on CT Images: From the Fleischner Society 2017; Radiology 2017; 284:228-243. 5. Esophageal air fluid level suggests dysmotility or gastroesophageal reflux. 6. Coronary artery atherosclerosis. Aortic Atherosclerosis (ICD10-I70.0). Electronically Signed   By: Abigail Miyamoto M.D.   On: 09/24/2022 20:38     Assessment/Plan   1. AKI superimposed on CKD IIIa  - Likely prerenal in setting of recent diarrhea and decreased appetite, in addition to continued ACE-inhibition   - Hold ACE-i, check UA, continue IVF hydration, renally-dose medications, repeat chem panel in am   2. Anemia; neutropenia  - Hgb is 9.8 on admission (11.4 on 08/05/22) and ANC is 144  - No overt bleeding or apparent infection  - Check anemia panel, use neutropenic precautions, repeat CBC with diff in am    3. Hypothyroidism  - TSH is elevated in ED and free T4 pending  - She has not been taking levothyroxine recently; this was resumed in ED  - Continue Synthroid, follow-up outpatient    4. Memory loss  - Being evaluated by PCP  - Delirium precautions    5. Rheumatoid arthritis  - Continue leflunomide    6. HTN  - Hold  benazepril in light of AKI, treat as-needed only for now    DVT prophylaxis: sq heparin  Code Status: DNR  Level of Care: Level of care: Med-Surg Family Communication: Son at bedside   Disposition Plan:  Patient is from: home  Anticipated d/c is to: TBD Anticipated d/c date is: 09/27/22 Patient currently: Pending improved/stable renal function and blood counts  Consults called: none  Admission status: Inpatient     Vianne Bulls, MD [facility]  09/24/2022, 9:42 PM

## 2022-09-24 NOTE — Progress Notes (Signed)
Assessment/Plan:   Problem List Items Addressed This Visit       Digestive   Acute diarrhea    The patient experienced a significant episode of diarrhea last week, which was acute and atypical for her. There has been no recurrence reported.   Plan:  Monitor for any recurrence of diarrhea. Maintain hydration  Review current medications for any that may cause diarrhea as a side effect.        Other   Unintentional weight loss - Primary    The patient has lost approximately 6 pounds over the past three weeks, which raises concerns for underlying pathology.  As well as risk for dehydration.  Differential diagnosis:  Malignancy Chronic infection Endocrine disorder  Plan:  Perform comprehensive lab work including electrolytes, liver enzymes, and thyroid function tests. Schedule follow-up appointment to monitor weight and assess results of lab and imaging studies.      Relevant Orders   DG Chest 2 View   POC SOFIA Antigen FIA   POCT Influenza A/B (Completed)   POC COVID-19 BinaxNow (Completed)   B Nat Peptide   CBC w/Diff   Comp Met (CMET)   Thyroid Panel With TSH   Urinalysis   Impaired mental alertness    The patient has demonstrated recent forgetfulness and confusion, concerning for early cognitive decline.  Differential diagnosis:  Early dementia Delirium secondary to infection or metabolic imbalance Side effects of current medications  Plan:  Check for electrolyte imbalances and infections which may contribute to cognitive symptoms. Review medication list for potential cognitive side effects. Consider a formal cognitive assessment or referral to neurology if symptoms persist.       There are no discontinued medications.    Subjective:  HPI: Encounter date: 09/24/2022  [name] [name] is a 88 y.o. female who has Hypothyroidism; Dyslipidemia; Essential hypertension; Rheumatoid arthritis ([place]); CHF, acute ([place]); Methotrexate lung??; High risk  medication use; Osteopenia of multiple sites; DDD cervical spine; Primary osteoarthritis of both hands; History of congestive heart failure; History of hypothyroidism; Chronic kidney disease (CKD) stage G3b; DDD (degenerative disc disease), cervical; Elevated LDL cholesterol level; Paroxysmal supraventricular tachycardia; Lymphocytosis; Neutropenia ([place]); Iron deficiency anemia; Need for influenza vaccination; Large granular lymphocytic leukemia ([place]); Goals of care, counseling/discussion; Need for RSV immunization; Prediabetes; Acute diarrhea; Unintentional weight loss; and Impaired mental alertness on their problem list..   She  has a past medical history of Goals of care, counseling/discussion (08/06/2021), HYPERLIPIDEMIA (01/19/2007), HYPERTENSION (01/19/2007), HYPOTHYROIDISM (01/19/2007), Large granular lymphocytic leukemia ([place]) (08/06/2021), and Rheumatoid arthritis(714.0) (01/19/2007)..   Patient is here with son who is partial caregiver and provides supplemental history.  CHIEF COMPLAINT: The patient presents with a recent episode of severe diarrhea and changes in appetite, energy, and color.  HISTORY OF PRESENT ILLNESS:  Diarrhea. The patient experienced significant diarrhea approximately one week prior, which was unusual for her usual bowel habits. Described by the son as a "blowout," this episode occurred rapidly and extensively, deviating from her normal capacity to reach the bathroom in time. There have been no further episodes reported since the initial incident.  Appetite and General Decline. Over the past few days, the patient's appetite has diminished, consuming approximately one meal a day. The caregiver also reports a noticeable decline in the patient's routine activities such as making the bed and dressing up. Additionally, there are concerns regarding paleness and reduced energy levels, suggesting a possible decline in her overall condition.  Cognitive Changes. The patient is described as  forgetful and  confused, potentially showing early signs of dementia; however, there is no clear diagnosis of dementia at this stage. These cognitive changes are recent developments within the last week.  Medication Effectiveness and Side Effects. The patient has a complex medical history including chronic kidney disease and leukemia. She is on multiple medications, including those for managing her rheumatoid arthritis and leukemia. There are no specific mentions of recent changes to medication regiments or newly observed side effects.  ROS:  Cardiology: No chest pain, palpitations or known heart failure related symptoms. Gastrointestinal: No ongoing diarrhea, pertinent history of diarrhea episode reported. Neurological: No dizzy spells; alert and oriented. Respiratory: Denies wheezing, trouble breathing or dyspnea  General: Reported weight loss of approximately 6 pounds within the last three weeks.  Past Surgical History:  Procedure Laterality Date   ABDOMINAL HYSTERECTOMY     APPENDECTOMY     CHOLECYSTECTOMY     NECK SURGERY      Outpatient Medications Prior to Visit  Medication Sig Dispense Refill   acetaminophen (TYLENOL) 650 MG CR tablet Take 650 mg by mouth every 8 (eight) hours as needed for pain.     benazepril (LOTENSIN) 5 MG tablet TAKE 2 TABLETS (10MG TOTAL) BY MOUTH EVERY DAY 180 tablet 1   JANUVIA 25 MG tablet TAKE 1 TABLET (25 MG TOTAL) BY MOUTH DAILY. 30 tablet 2   leflunomide (ARAVA) 10 MG tablet TAKE 1 TABLET BY MOUTH EVERY DAY 90 tablet 0   levothyroxine (SYNTHROID) 75 MCG tablet TAKE 1 TABLET BY MOUTH EVERY DAY 90 tablet 3   methotrexate (RHEUMATREX) 10 MG tablet Take 2 tablets (20 mg total) by mouth once a week. 8 tablet 3   methotrexate (RHEUMATREX) 2.5 MG tablet Take 2.5 mg by mouth.     Multiple Minerals-Vitamins (CALCIUM CITRATE PLUS/MAGNESIUM) TABS Take 1 tablet by mouth daily.     Polyethyl Glycol-Propyl Glycol (SYSTANE OP) Apply to eye as needed.      pravastatin (PRAVACHOL) 20 MG tablet Take 1 tablet (20 mg total) by mouth daily. Needs appointment for future refills (Patient not taking: Reported on 08/08/2022) 90 tablet 3   No facility-administered medications prior to visit.    Family History  Problem Relation Age of Onset   Diabetes Father    Heart disease Father    Cancer Sister        breast    Heart disease Sister    Heart attack Brother    Heart attack Brother    Heart attack Brother     Social History   Socioeconomic History   Marital status: Married    Spouse name: Not on file   Number of children: Not on file   Years of education: Not on file   Highest education level: Not on file  Occupational History   Not on file  Tobacco Use   Smoking status: Never    Passive exposure: Never   Smokeless tobacco: Never  Vaping Use   Vaping Use: Never used  Substance and Sexual Activity   Alcohol use: No   Drug use: No   Sexual activity: Not Currently  Other Topics Concern   Not on file  Social History Narrative   Not on file   Social Determinants of Health   Financial Resource Strain: Low Risk  (08/08/2022)   Overall Financial Resource Strain (CARDIA)    Difficulty of Paying Living Expenses: Not hard at all  Food Insecurity: No Food Insecurity (08/08/2022)   Hunger Vital Sign    Worried  About Running Out of Food in the Last Year: Never true    Ran Out of Food in the Last Year: Never true  Transportation Needs: No Transportation Needs (08/08/2022)   PRAPARE - Hydrologist (Medical): No    Lack of Transportation (Non-Medical): No  Physical Activity: Insufficiently Active (08/08/2022)   Exercise Vital Sign    Days of Exercise per Week: 3 days    Minutes of Exercise per Session: 10 min  Stress: No Stress Concern Present (08/08/2022)   [place]    Feeling of Stress : Not at all  Social Connections: Moderately Isolated  (07/31/2021)   Social Connection and Isolation Panel [NHANES]    Frequency of Communication with Friends and Family: Twice a week    Frequency of Social Gatherings with Friends and Family: Twice a week    Attends Religious Services: More than 4 times per year    Active Member of [employer] or Organizations: No    Attends Archivist Meetings: Never    Marital Status: Widowed  Intimate Partner Violence: Not At Risk (07/31/2021)   Humiliation, Afraid, Rape, and Kick questionnaire    Fear of Current or Ex-Partner: No    Emotionally Abused: No    Physically Abused: No    Sexually Abused: No                                                                                                 Objective:  Physical Exam: BP 124/82 (BP Location: Left Arm, Patient Position: Sitting, Cuff Size: Large)   Pulse 74   Temp (!) 97.5 F (36.4 C) (Temporal)   Ht 5' 1.5" (1.562 m)   Wt 113 lb 3.2 oz (51.3 kg)   SpO2 93%   BMI 21.04 kg/m    Gen: NAD, resting comfortably CV: RRR with no murmurs appreciated Pulm: NWOB, CTAB with no crackles, wheezes, or rhonchi GI: Normal bowel sounds present. Soft, Nontender, Nondistended. MSK: no edema, cyanosis, or clubbing noted Skin: warm, dry Neuro: grossly normal, moves all extremities Psych: Normal affect and thought content  Results for orders placed or performed in visit on 09/24/22  POCT Influenza A/B  Result Value Ref Range   Influenza A, POC Negative Negative   Influenza B, POC Negative Negative  POC COVID-19 BinaxNow  Result Value Ref Range   SARS Coronavirus 2 Ag Negative Negative        Alesia Banda, MD, MS

## 2022-09-24 NOTE — ED Notes (Signed)
ED TO INPATIENT HANDOFF REPORT  ED Nurse Name and Phone #: Clarise Cruz [phone]  S Name/Age/Gender [name] [name] 88 y.o. female Room/Bed: WA17/WA17  Code Status   Code Status: Full Code  Home/SNF/Other  Patient oriented to: self, place, time, and situation Is this baseline? Yes   Triage Complete: Triage complete  Chief Complaint Acute renal failure superimposed on stage 3a chronic kidney disease ([place]) [N17.9, N18.31]  Triage Note Sent to ER for blood work due to doctors office unable to obtain Diarrhea 1 week ago and PCP concerned for dehydration    Allergies Allergies  Allergen Reactions   Tramadol Nausea Only   Codeine Phosphate Nausea Only    REACTION: unspecified    Level of Care/Admitting Diagnosis ED Disposition     ED Disposition  Admit   Condition  --   [place]: [facility] [[id number]]  Level of Care: Med-Surg [16]  May admit patient to [facility] or [facility] if equivalent level of care is available:: Yes  Covid Evaluation: Confirmed COVID Negative  Diagnosis: Acute renal failure superimposed on stage 3a chronic kidney disease [id number]  Admitting Physician: Vianne Bulls [id number]  Attending Physician: Vianne Bulls [id number]  Certification:: I certify this patient will need inpatient services for at least 2 midnights  Estimated Length of Stay: 3          B Medical/Surgery History Past Medical History:  Diagnosis Date   Goals of care, counseling/discussion 08/06/2021   HYPERLIPIDEMIA 01/19/2007   HYPERTENSION 01/19/2007   HYPOTHYROIDISM 01/19/2007   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   Rheumatoid arthritis(714.0) 01/19/2007   Past Surgical History:  Procedure Laterality Date   ABDOMINAL HYSTERECTOMY     APPENDECTOMY     CHOLECYSTECTOMY     NECK SURGERY       A IV Location/Drains/Wounds Patient Lines/Drains/Airways Status     Active Line/Drains/Airways     Name Placement date  Placement time Site Days   Peripheral IV 09/24/22 18 G Right Antecubital 09/24/22  1833  Antecubital  less than 1            Intake/Output Last 24 hours  Intake/Output Summary (Last 24 hours) at 09/24/2022 2216 Last data filed at 09/24/2022 2207 Gross per 24 hour  Intake 50 ml  Output --  Net 50 ml    Labs/Imaging Results for orders placed or performed during the hospital encounter of 09/24/22 (from the past 48 hour(s))  CBC with Differential     Status: Abnormal   Collection Time: 09/24/22  5:39 PM  Result Value Ref Range   WBC 7.2 4.0 - 10.5 K/uL   RBC 3.93 3.87 - 5.11 MIL/uL   Hemoglobin 9.8 (L) 12.0 - 15.0 g/dL   HCT 32.3 (L) 36.0 - 46.0 %   MCV 82.2 80.0 - 100.0 fL   MCH 24.9 (L) 26.0 - 34.0 pg   MCHC 30.3 30.0 - 36.0 g/dL   RDW 16.1 (H) 11.5 - 15.5 %   Platelets 346 150 - 400 K/uL   nRBC 0.0 0.0 - 0.2 %   Neutrophils Relative % 2 %   Neutro Abs 0.1 (LL) 1.7 - 7.7 K/uL    Comment: This critical result has verified and been called to A. KELSEY, RN by Yves Dill on 02 21 2024 at 1927, and has been read back.    Lymphocytes Relative 78 %   Lymphs Abs 5.7 (H) 0.7 - 4.0 K/uL   Monocytes Relative  20 %   Monocytes Absolute 1.4 (H) 0.1 - 1.0 K/uL   Eosinophils Relative 0 %   Eosinophils Absolute 0.0 0.0 - 0.5 K/uL   Basophils Relative 0 %   Basophils Absolute 0.0 0.0 - 0.1 K/uL   RBC Morphology MORPHOLOGY UNREMARKABLE    Smear Review PLATELET CLUMPS NOTED ON SMEAR    Immature Granulocytes 0 %   Abs Immature Granulocytes 0.00 0.00 - 0.07 K/uL   Reactive, Benign Lymphocytes PRESENT     Comment: HISTORY OF LARGE GRANULAR LYMPHOCYTIC LEUKEMIA Performed at [facility], [place] [address] [place], [place] [address]   Comprehensive metabolic panel     Status: Abnormal   Collection Time: 09/24/22  5:39 PM  Result Value Ref Range   Sodium 138 135 - 145 mmol/L   Potassium 4.4 3.5 - 5.1 mmol/L   Chloride 105 98 - 111 mmol/L   CO2 24 22 - 32 mmol/L    Glucose, Bld 110 (H) 70 - 99 mg/dL    Comment: Glucose reference range applies only to samples taken after fasting for at least 8 hours.   BUN 49 (H) 8 - 23 mg/dL   Creatinine, Ser 2.16 (H) 0.44 - 1.00 mg/dL   Calcium 8.9 8.9 - 10.3 mg/dL   Total Protein 7.4 6.5 - 8.1 g/dL   Albumin 3.4 (L) 3.5 - 5.0 g/dL   AST 25 15 - 41 U/L   ALT 18 0 - 44 U/L   Alkaline Phosphatase 52 38 - 126 U/L   Total Bilirubin 0.7 0.3 - 1.2 mg/dL   GFR, Estimated 22 (L) >60 mL/min    Comment: (NOTE) Calculated using the CKD-EPI Creatinine Equation (2021)    Anion gap 9 5 - 15    Comment: Performed at [facility], [place] [address], [place], [place] [address]  Lipase, blood     Status: None   Collection Time: 09/24/22  5:39 PM  Result Value Ref Range   Lipase 42 11 - 51 U/L    Comment: Performed at [facility], [place] [address], [place], [place] [address]  TSH     Status: Abnormal   Collection Time: 09/24/22  5:39 PM  Result Value Ref Range   TSH 43.150 (H) 0.350 - 4.500 uIU/mL    Comment: Performed by a 3rd Generation assay with a functional sensitivity of <=0.01 uIU/mL. Performed at [facility], [place] [address], [place],  [address]    CT CHEST ABDOMEN PELVIS WO CONTRAST  Result Date: 09/24/2022 CLINICAL DATA:  Groin lymphadenopathy. Weakness. Weight loss. History of leukemia. Diarrhea. * Tracking Code: BO * EXAM: CT CHEST, ABDOMEN AND PELVIS WITHOUT CONTRAST TECHNIQUE: Multidetector CT imaging of the chest, abdomen and pelvis was performed following the standard protocol without IV contrast. RADIATION DOSE REDUCTION: This exam was performed according to the departmental dose-optimization program which includes automated exposure control, adjustment of the mA and/or kV according to patient size and/or use of iterative reconstruction technique. COMPARISON:  No comparison CTs FINDINGS: CT CHEST FINDINGS Cardiovascular: Aortic atherosclerosis. Tortuous  thoracic aorta. Mild cardiomegaly, without pericardial effusion. Multivessel coronary artery atherosclerosis. Aortic valve calcifications are nonspecific in this age group. Mediastinum/Nodes: No axillary or subpectoral adenopathy. No mediastinal or hilar adenopathy, given limitations of unenhanced CT. Upper esophageal fluid level on 23/2. Lungs/Pleura: No pleural fluid. Mild biapical pleuroparenchymal scarring. Mild bibasilar scarring. Left apical 4 mm pulmonary nodule on 35/6 is solid. A vague 3 mm right upper lobe pulmonary nodule  on 39/6 may be subsolid. Musculoskeletal: Cervical spine fixation. CT ABDOMEN PELVIS FINDINGS Hepatobiliary: Scattered hepatic well-circumscribed low-density lesions of up to 11 mm are favored to represent cysts. Cholecystectomy, without biliary ductal dilatation. Pancreas: Normal, without mass or ductal dilatation. Spleen: Normal in size, without focal abnormality. Adrenals/Urinary Tract: Normal adrenal glands. No renal calculi or hydronephrosis. No bladder calculi. Stomach/Bowel: Proximal gastric underdistention. Normal colon and terminal ileum. Appendix not visualized. Normal small bowel. Vascular/Lymphatic: Aortic atherosclerosis. No abdominopelvic adenopathy. Reproductive: Hysterectomy.  No adnexal mass. Other: No significant free fluid. No free intraperitoneal air. Calcified left abdominal wall nodule of 1.6 cm is nonspecific. Musculoskeletal: Mild degenerative changes of both hips. Lumbosacral spondylosis. IMPRESSION: 1. No evidence of adenopathy, including within the groins. 2. Mildly decreased sensitivity exam secondary to noncontrast technique. 3.  No acute findings. 4. Tiny bilateral upper lobe pulmonary nodules. No follow-up needed if patient is low-risk. Non-contrast chest CT can be considered in 12 months if patient is high-risk, nodule is upper lobe, and/or suspicious in morphology. This recommendation follows the consensus statement: Guidelines for Management of  Incidental Pulmonary Nodules Detected on CT Images: From the Fleischner Society 2017; Radiology 2017; 284:228-243. 5. Esophageal air fluid level suggests dysmotility or gastroesophageal reflux. 6. Coronary artery atherosclerosis. Aortic Atherosclerosis (ICD10-I70.0). Electronically Signed   By: Abigail Miyamoto M.D.   On: 09/24/2022 20:38    Pending Labs Unresulted Labs (From admission, onward)     Start     Ordered   09/25/22 [id number]  Basic metabolic panel  Daily,   R      09/24/22 2142   09/25/22 0500  Magnesium  Tomorrow morning,   R        09/24/22 2142   09/25/22 0500  Phosphorus  Tomorrow morning,   R        09/24/22 2142   09/25/22 0500  CBC with Differential/Platelet  Daily,   R      09/24/22 2142   09/25/22 0500  Vitamin B12  (Anemia Panel (PNL))  Tomorrow morning,   R        09/24/22 2144   09/25/22 0500  Folate  (Anemia Panel (PNL))  Tomorrow morning,   R        09/24/22 2144   09/25/22 0500  Iron and TIBC  (Anemia Panel (PNL))  Tomorrow morning,   R        09/24/22 2144   09/25/22 0500  Ferritin  (Anemia Panel (PNL))  Tomorrow morning,   R        09/24/22 2144   09/25/22 0500  Reticulocytes  (Anemia Panel (PNL))  Tomorrow morning,   R        09/24/22 2144   09/24/22 2143  Sodium, urine, random  Once,   R        09/24/22 2142   09/24/22 2143  Creatinine, urine, random  Once,   R        09/24/22 2142   09/24/22 2143  Protein / creatinine ratio, urine  Once,   R        09/24/22 2142   09/24/22 2140  Urinalysis, Complete w Microscopic -Urine, Clean Catch  Once,   R       Question:  Specimen Source  Answer:  Urine, Clean Catch   09/24/22 2142   09/24/22 1728  T4, free  Once,   URGENT        09/24/22 1728            Vitals/Pain  Today's Vitals   09/24/22 1658 09/24/22 1805 09/24/22 2000 09/24/22 2203  BP:  (!) 131/59 (!) 107/46 (!) 122/57  Pulse:  97 91 89  Resp:  16 18 18  $ Temp:  98.9 F (37.2 C)  98.4 F (36.9 C)  TempSrc:  Oral  Oral  SpO2:  100% 100% 100%   PainSc: 0-No pain       Isolation Precautions Protective Precautions  Medications Medications  heparin injection 5,000 Units (has no administration in time range)  acetaminophen (TYLENOL) tablet 650 mg (has no administration in time range)    Or  acetaminophen (TYLENOL) suppository 650 mg (has no administration in time range)  0.9 %  sodium chloride infusion (has no administration in time range)  leflunomide (ARAVA) tablet 10 mg (has no administration in time range)  levothyroxine (SYNTHROID) tablet 75 mcg (has no administration in time range)  sodium chloride 0.9 % bolus 1,000 mL (1,000 mLs Intravenous New Bag/Given 09/24/22 2116)  famotidine (PEPCID) IVPB 20 mg premix (0 mg Intravenous Stopped 09/24/22 2207)  levothyroxine (SYNTHROID) tablet 75 mcg (75 mcg Oral Given 09/24/22 2123)    Mobility walks     Focused Assessments    R Recommendations: See Admitting Provider Note  Report given to:   Additional Notes:

## 2022-09-24 NOTE — Assessment & Plan Note (Signed)
The patient experienced a significant episode of diarrhea last week, which was acute and atypical for her. There has been no recurrence reported.   Plan:  Monitor for any recurrence of diarrhea. Maintain hydration  Review current medications for any that may cause diarrhea as a side effect.

## 2022-09-24 NOTE — ED Notes (Signed)
Pt transported to CT ?

## 2022-09-24 NOTE — Patient Instructions (Signed)
We are obtaining labs for potential infection or dehydration with weight loss.   For chest xray, go to:    [facility], [place], [place], [place] [address] Phone: [phone]  Please continue to drink plenty of fluids.   Please be sure to go to ED if develop severe symptoms of diarrhea, chest pain, shortness of breath, or anything else severe.

## 2022-09-24 NOTE — ED Triage Notes (Signed)
Sent to ER for blood work due to doctors office unable to obtain Diarrhea 1 week ago and PCP concerned for dehydration

## 2022-09-24 NOTE — ED Notes (Signed)
Hospitalist at bedside 

## 2022-09-24 NOTE — Assessment & Plan Note (Signed)
The patient has lost approximately 6 pounds over the past three weeks, which raises concerns for underlying pathology.  As well as risk for dehydration.  Differential diagnosis:  Malignancy Chronic infection Endocrine disorder  Plan:  Perform comprehensive lab work including electrolytes, liver enzymes, and thyroid function tests. Schedule follow-up appointment to monitor weight and assess results of lab and imaging studies.

## 2022-09-24 NOTE — ED Provider Notes (Signed)
[facility] Provider Note   CSN: [id number] Arrival date & time: 09/24/22  1642     History  Chief Complaint  Patient presents with   requesting blood work    [name] [name] is a 88 y.o. female history of hypothyroidism, rheumatoid arthritis, here presenting with dehydration and diarrhea.  Patient has been having diarrhea for the last week or so.  Patient just feels dehydrated and weak and tired.  Patient went to the office today and they were unable to get blood work and was sent in for dehydration and IV fluids and blood work.  Of note, patient states that her Synthroid was refilled last year but her doctor never refilled it this year.  Of note, patient also has leukemia and follows with Dr. Marin Olp and is in remission   The history is provided by the patient.       Home Medications Prior to Admission medications   Medication Sig Start Date End Date Taking? Authorizing Provider  acetaminophen (TYLENOL) 650 MG CR tablet Take 650 mg by mouth every 8 (eight) hours as needed for pain.    [provider]  benazepril (LOTENSIN) 5 MG tablet TAKE 2 TABLETS (10MG TOTAL) BY MOUTH EVERY DAY 03/25/22   Libby Maw, MD  JANUVIA 25 MG tablet TAKE 1 TABLET (25 MG TOTAL) BY MOUTH DAILY. 08/04/22   Libby Maw, MD  leflunomide (ARAVA) 10 MG tablet TAKE 1 TABLET BY MOUTH EVERY DAY 08/13/22   Ofilia Neas, PA-C  levothyroxine (SYNTHROID) 75 MCG tablet TAKE 1 TABLET BY MOUTH EVERY DAY 01/20/22   Libby Maw, MD  methotrexate (RHEUMATREX) 10 MG tablet Take 2 tablets (20 mg total) by mouth once a week. 04/29/22   Volanda Napoleon, MD  methotrexate (RHEUMATREX) 2.5 MG tablet Take 2.5 mg by mouth. 08/04/22   [provider]  Multiple Minerals-Vitamins (CALCIUM CITRATE PLUS/MAGNESIUM) TABS Take 1 tablet by mouth daily.    [provider]  Polyethyl Glycol-Propyl Glycol (SYSTANE OP) Apply to eye as needed.     [provider]  pravastatin (PRAVACHOL) 20 MG tablet Take 1 tablet (20 mg total) by mouth daily. Needs appointment for future refills Patient not taking: Reported on 08/08/2022 04/24/22   Libby Maw, MD      Allergies    Tramadol and Codeine phosphate    Review of Systems   Review of Systems  Gastrointestinal:  Positive for diarrhea.  All other systems reviewed and are negative.   Physical Exam Updated Vital Signs BP (!) 131/59 (BP Location: Left Arm)   Pulse 97   Temp 98.9 F (37.2 C) (Oral)   Resp 16   SpO2 100%  Physical Exam Vitals and nursing note reviewed.  Constitutional:      Comments: Dehydrated and chronically ill  HENT:     Head: Normocephalic.     Nose: Nose normal.     Mouth/Throat:     Mouth: Mucous membranes are dry.  Eyes:     Extraocular Movements: Extraocular movements intact.     Pupils: Pupils are equal, round, and reactive to light.  Cardiovascular:     Rate and Rhythm: Normal rate and regular rhythm.     Pulses: Normal pulses.     Heart sounds: Normal heart sounds.  Pulmonary:     Effort: Pulmonary effort is normal.     Breath sounds: Normal breath sounds.  Abdominal:     General: Abdomen is  flat.     Palpations: Abdomen is soft.     Comments: Mild lower abdominal tenderness.  Musculoskeletal:        General: Normal range of motion.     Cervical back: Normal range of motion and neck supple.  Skin:    General: Skin is warm.     Capillary Refill: Capillary refill takes less than 2 seconds.  Neurological:     General: No focal deficit present.     Mental Status: She is oriented to person, place, and time.  Psychiatric:        Mood and Affect: Mood normal.        Behavior: Behavior normal.     ED Results / Procedures / Treatments   Labs (all labs ordered are listed, but only abnormal results are displayed) Labs Reviewed  CBC WITH DIFFERENTIAL/PLATELET - Abnormal; Notable for the following components:      Result  Value   Hemoglobin 9.8 (*)    HCT 32.3 (*)    MCH 24.9 (*)    RDW 16.1 (*)    Neutro Abs 0.1 (*)    Lymphs Abs 5.7 (*)    Monocytes Absolute 1.4 (*)    All other components within normal limits  COMPREHENSIVE METABOLIC PANEL - Abnormal; Notable for the following components:   Glucose, Bld 110 (*)    BUN 49 (*)    Creatinine, Ser 2.16 (*)    Albumin 3.4 (*)    GFR, Estimated 22 (*)    All other components within normal limits  TSH - Abnormal; Notable for the following components:   TSH 43.150 (*)    All other components within normal limits  LIPASE, BLOOD  URINALYSIS, ROUTINE W REFLEX MICROSCOPIC  T4, FREE    EKG None  Radiology CT CHEST ABDOMEN PELVIS WO CONTRAST  Result Date: 09/24/2022 CLINICAL DATA:  Groin lymphadenopathy. Weakness. Weight loss. History of leukemia. Diarrhea. * Tracking Code: BO * EXAM: CT CHEST, ABDOMEN AND PELVIS WITHOUT CONTRAST TECHNIQUE: Multidetector CT imaging of the chest, abdomen and pelvis was performed following the standard protocol without IV contrast. RADIATION DOSE REDUCTION: This exam was performed according to the departmental dose-optimization program which includes automated exposure control, adjustment of the mA and/or kV according to patient size and/or use of iterative reconstruction technique. COMPARISON:  No comparison CTs FINDINGS: CT CHEST FINDINGS Cardiovascular: Aortic atherosclerosis. Tortuous thoracic aorta. Mild cardiomegaly, without pericardial effusion. Multivessel coronary artery atherosclerosis. Aortic valve calcifications are nonspecific in this age group. Mediastinum/Nodes: No axillary or subpectoral adenopathy. No mediastinal or hilar adenopathy, given limitations of unenhanced CT. Upper esophageal fluid level on 23/2. Lungs/Pleura: No pleural fluid. Mild biapical pleuroparenchymal scarring. Mild bibasilar scarring. Left apical 4 mm pulmonary nodule on 35/6 is solid. A vague 3 mm right upper lobe pulmonary nodule on 39/6 may be  subsolid. Musculoskeletal: Cervical spine fixation. CT ABDOMEN PELVIS FINDINGS Hepatobiliary: Scattered hepatic well-circumscribed low-density lesions of up to 11 mm are favored to represent cysts. Cholecystectomy, without biliary ductal dilatation. Pancreas: Normal, without mass or ductal dilatation. Spleen: Normal in size, without focal abnormality. Adrenals/Urinary Tract: Normal adrenal glands. No renal calculi or hydronephrosis. No bladder calculi. Stomach/Bowel: Proximal gastric underdistention. Normal colon and terminal ileum. Appendix not visualized. Normal small bowel. Vascular/Lymphatic: Aortic atherosclerosis. No abdominopelvic adenopathy. Reproductive: Hysterectomy.  No adnexal mass. Other: No significant free fluid. No free intraperitoneal air. Calcified left abdominal wall nodule of 1.6 cm is nonspecific. Musculoskeletal: Mild degenerative changes of both hips. Lumbosacral spondylosis. IMPRESSION: 1.  No evidence of adenopathy, including within the groins. 2. Mildly decreased sensitivity exam secondary to noncontrast technique. 3.  No acute findings. 4. Tiny bilateral upper lobe pulmonary nodules. No follow-up needed if patient is low-risk. Non-contrast chest CT can be considered in 12 months if patient is high-risk, nodule is upper lobe, and/or suspicious in morphology. This recommendation follows the consensus statement: Guidelines for Management of Incidental Pulmonary Nodules Detected on CT Images: From the Fleischner Society 2017; Radiology 2017; 284:228-243. 5. Esophageal air fluid level suggests dysmotility or gastroesophageal reflux. 6. Coronary artery atherosclerosis. Aortic Atherosclerosis (ICD10-I70.0). Electronically Signed   By: Abigail Miyamoto M.D.   On: 09/24/2022 20:38    Procedures Procedures    Medications Ordered in ED Medications  sodium chloride 0.9 % bolus 1,000 mL (has no administration in time range)  famotidine (PEPCID) IVPB 20 mg premix (has no administration in time  range)    ED Course/ Medical Decision Making/ A&P                             Medical Decision Making ETHELINE LEON is a 88 y.o. female here presenting with weakness and diarrhea.  Patient appears dehydrated.  Plan to get CBC and CMP and UA.  Patient also has history of leukemia so we will get CT chest abdomen pelvis to look for lymphoma or mass  9:21 PM I reviewed patient's labs and intermittently interpreted imaging studies.  Patient has acute renal failure with creatinine is 2.1.  Patient also is neutropenic with ANC of less than 0.1.  Patient does not have a fever right now so we will hold off on antibiotics or blood cultures.  Patient's TSH is 43 but she is out of her Synthroid for last several months and has no signs of myxedema coma.  Patient CT chest and pelvis showed no mass but has a nodule and also reflux.  Patient was given Pepcid and IV fluids and Synthroid.  At this point, hospitalist to admit for neutropenia, acute renal failure, hypothyroidism.    Problems Addressed: AKI (acute kidney injury) ([place]): acute illness or injury Hypothyroidism, unspecified type: acute illness or injury Neutropenia, unspecified type [facility]): acute illness or injury Pulmonary nodule: acute illness or injury Reflux gastritis: acute illness or injury  Amount and/or Complexity of Data Reviewed Labs: ordered. Decision-making details documented in ED Course. Radiology: ordered and independent interpretation performed. Decision-making details documented in ED Course.  Risk Prescription drug management.    Final Clinical Impression(s) / ED Diagnoses Final diagnoses:  None    Rx / DC Orders ED Discharge Orders     None         Drenda Freeze, MD 09/24/22 2123

## 2022-09-24 NOTE — ED Provider Triage Note (Signed)
Emergency Medicine Provider Triage Evaluation Note  [facility] , a 88 y.o. female  was evaluated in triage.  Pt complains of diarrhea, fatigue and requested blood work.  Per son patient had 1 episode of diarrhea a week ago and she has had fatigue since then.  Patient went to her PCP today and was sent here for more blood work.  She denies chest pain shortness of breath fever bowel changes or urinary symptoms.  Review of Systems  Positive: As above Negative: As above  Physical Exam  BP 121/61   Pulse 99   Temp 97.9 F (36.6 C)   Resp 20   SpO2 100%  Gen:   Awake, no distress   Resp:  Normal effort  MSK:   Moves extremities without difficulty  Other:    Medical Decision Making  Medically screening exam initiated at 5:28 PM.  Appropriate orders placed.  [facility] was informed that the remainder of the evaluation will be completed by another provider, this initial triage assessment does not replace that evaluation, and the importance of remaining in the ED until their evaluation is complete.    Rex Kras, [place] 09/25/22 [phone]

## 2022-09-24 NOTE — Assessment & Plan Note (Signed)
The patient has demonstrated recent forgetfulness and confusion, concerning for early cognitive decline.  Differential diagnosis:  Early dementia Delirium secondary to infection or metabolic imbalance Side effects of current medications  Plan:  Check for electrolyte imbalances and infections which may contribute to cognitive symptoms. Review medication list for potential cognitive side effects. Consider a formal cognitive assessment or referral to neurology if symptoms persist.

## 2022-09-24 NOTE — Telephone Encounter (Signed)
Pt son called to notify he is on the way to the ER with his mom and heading to [facility] hospital.

## 2022-09-24 NOTE — ED Notes (Signed)
Attempted to collect urine sample. Hat placed in toilet. Pt urinated in hat then emptied it into toilet.

## 2022-09-25 ENCOUNTER — Encounter: Payer: Self-pay | Admitting: Family

## 2022-09-25 DIAGNOSIS — N1831 Chronic kidney disease, stage 3a: Secondary | ICD-10-CM | POA: Diagnosis not present

## 2022-09-25 DIAGNOSIS — N179 Acute kidney failure, unspecified: Secondary | ICD-10-CM | POA: Diagnosis not present

## 2022-09-25 DIAGNOSIS — E43 Unspecified severe protein-calorie malnutrition: Secondary | ICD-10-CM | POA: Insufficient documentation

## 2022-09-25 LAB — CBC WITH DIFFERENTIAL/PLATELET
Abs Immature Granulocytes: 0 10*3/uL (ref 0.00–0.07)
Basophils Absolute: 0 10*3/uL (ref 0.0–0.1)
Basophils Relative: 0 %
Eosinophils Absolute: 0 10*3/uL (ref 0.0–0.5)
Eosinophils Relative: 1 %
HCT: 27.9 % — ABNORMAL LOW (ref 36.0–46.0)
Hemoglobin: 8.6 g/dL — ABNORMAL LOW (ref 12.0–15.0)
Immature Granulocytes: 0 %
Lymphocytes Relative: 78 %
Lymphs Abs: 4.5 10*3/uL — ABNORMAL HIGH (ref 0.7–4.0)
MCH: 25 pg — ABNORMAL LOW (ref 26.0–34.0)
MCHC: 30.8 g/dL (ref 30.0–36.0)
MCV: 81.1 fL (ref 80.0–100.0)
Monocytes Absolute: 1.1 10*3/uL — ABNORMAL HIGH (ref 0.1–1.0)
Monocytes Relative: 19 %
Neutro Abs: 0.1 10*3/uL — CL (ref 1.7–7.7)
Neutrophils Relative %: 2 %
Platelets: 325 10*3/uL (ref 150–400)
RBC: 3.44 MIL/uL — ABNORMAL LOW (ref 3.87–5.11)
RDW: 15.8 % — ABNORMAL HIGH (ref 11.5–15.5)
WBC: 5.7 10*3/uL (ref 4.0–10.5)
nRBC: 0 % (ref 0.0–0.2)

## 2022-09-25 LAB — BASIC METABOLIC PANEL
Anion gap: 7 (ref 5–15)
BUN: 39 mg/dL — ABNORMAL HIGH (ref 8–23)
CO2: 21 mmol/L — ABNORMAL LOW (ref 22–32)
Calcium: 8 mg/dL — ABNORMAL LOW (ref 8.9–10.3)
Chloride: 112 mmol/L — ABNORMAL HIGH (ref 98–111)
Creatinine, Ser: 1.75 mg/dL — ABNORMAL HIGH (ref 0.44–1.00)
GFR, Estimated: 28 mL/min — ABNORMAL LOW (ref >60)
Glucose, Bld: 93 mg/dL (ref 70–99)
Potassium: 4.2 mmol/L (ref 3.5–5.1)
Sodium: 140 mmol/L (ref 135–145)

## 2022-09-25 LAB — T4, FREE: Free T4: 0.75 ng/dL (ref 0.61–1.12)

## 2022-09-25 LAB — FERRITIN: Ferritin: 266 ng/mL (ref 11–307)

## 2022-09-25 LAB — IRON AND TIBC
Iron: 28 ug/dL (ref 28–170)
Saturation Ratios: 15 % (ref 10.4–31.8)
TIBC: 193 ug/dL — ABNORMAL LOW (ref 250–450)
UIBC: 165 ug/dL

## 2022-09-25 LAB — RETICULOCYTES
Immature Retic Fract: 27.4 % — ABNORMAL HIGH (ref 2.3–15.9)
RBC.: 3.39 MIL/uL — ABNORMAL LOW (ref 3.87–5.11)
Retic Count, Absolute: 55.3 10*3/uL (ref 19.0–186.0)
Retic Ct Pct: 1.6 % (ref 0.4–3.1)

## 2022-09-25 LAB — PHOSPHORUS: Phosphorus: 2.9 mg/dL (ref 2.5–4.6)

## 2022-09-25 LAB — VITAMIN B12: Vitamin B-12: 6651 pg/mL — ABNORMAL HIGH (ref 180–914)

## 2022-09-25 LAB — MAGNESIUM: Magnesium: 2 mg/dL (ref 1.7–2.4)

## 2022-09-25 LAB — FOLATE: Folate: 7 ng/mL (ref >5.9)

## 2022-09-25 MED ORDER — MELATONIN 3 MG PO TABS
6.0000 mg | ORAL_TABLET | Freq: Every evening | ORAL | Status: DC | PRN
  Administered 2022-09-25: 6 mg via ORAL
  Filled 2022-09-25: qty 2

## 2022-09-25 MED ORDER — ADULT MULTIVITAMIN W/MINERALS CH
1.0000 | ORAL_TABLET | Freq: Every day | ORAL | Status: DC
  Administered 2022-09-25 – 2022-09-26 (×2): 1 via ORAL
  Filled 2022-09-25 (×2): qty 1

## 2022-09-25 MED ORDER — ENSURE ENLIVE PO LIQD
237.0000 mL | Freq: Two times a day (BID) | ORAL | Status: DC
  Administered 2022-09-25 – 2022-09-26 (×2): 237 mL via ORAL

## 2022-09-25 NOTE — Evaluation (Signed)
Physical Therapy Evaluation Patient Details Name: [name] [name] MRN: [id number] DOB: Jan 06, 1935 Today's Date: 09/25/2022  History of Present Illness  88yo F who presented to the ED with concerns of dehydration after severe diarrhea mid-February. Admitted with AKI on CKD IIIa. PMH HLD, HTN, hypothyroidism, RA, neck surgery  Clinical Impression   Pt received in bed, pleasant and cooperative. Able to transfer well, however did become a bit unsteady with extended gait in the hallway even with use of her personal SPC, needed close min guard to maintain safety. Discussed use of rollator at home, she is agreeable. Left up in recliner with all needs met, chair alarm active. Will really benefit from skilled OP PT services after DC from the hospital.        Recommendations for follow up therapy are one component of a multi-disciplinary discharge planning process, led by the attending physician.  Recommendations may be updated based on patient status, additional functional criteria and insurance authorization.  Follow Up Recommendations Outpatient PT      Assistance Recommended at Discharge Intermittent Supervision/Assistance  Patient can return home with the following  A little help with walking and/or transfers;Direct supervision/assist for medications management;Direct supervision/assist for financial management;Assistance with cooking/housework;Assist for transportation;A little help with bathing/dressing/bathroom    Equipment Recommendations Rollator (4 wheels);Other (comment) (shower seat)  Recommendations for Other Services       Functional Status Assessment Patient has had a recent decline in their functional status and demonstrates the ability to make significant improvements in function in a reasonable and predictable amount of time.     Precautions / Restrictions Precautions Precautions: Fall Restrictions Weight Bearing Restrictions: No      Mobility  Bed Mobility Overal bed  mobility: Modified Independent             General bed mobility comments: HOB mildly elevated    Transfers Overall transfer level: Needs assistance Equipment used: Straight cane Transfers: Sit to/from Stand Sit to Stand: Supervision           General transfer comment: S with SPC, no physical assist given but did help to manage IV line    Ambulation/Gait Ambulation/Gait assistance: Min guard Gait Distance (Feet): 140 Feet Assistive device: Straight cane Gait Pattern/deviations: Step-through pattern, Narrow base of support, Drifts right/left, Trendelenburg Gait velocity: decreased     General Gait Details: mildly unsteady with gait requiring close min guard for safety, able to recover balance with no more than Min guard from PT  Stairs            Wheelchair Mobility    Modified Rankin (Stroke Patients Only)       Balance Overall balance assessment: Needs assistance Sitting-balance support: Feet supported, No upper extremity supported Sitting balance-Leahy Scale: Normal     Standing balance support: Reliant on assistive device for balance, Single extremity supported Standing balance-Leahy Scale: Fair Standing balance comment: min guard for safety/balance, tends to drift and stumble left                             Pertinent Vitals/Pain Pain Assessment Pain Assessment: No/denies pain Pain Score: 0-No pain    Home Living Family/patient expects to be discharged to:: Private residence Living Arrangements: Alone Available Help at Discharge: Family;Available PRN/intermittently Type of Home: Other(Comment) (townhouse) Home Access: Level entry       Home Layout: One level Home Equipment: Grab bars - tub/shower;Cane - single point  Prior Function Prior Level of Function : Independent/Modified Independent             Mobility Comments: carrys SPC with her but doesn't use it- more for security ADLs Comments: drives/dresses/bathes  on an independent basis     Hand Dominance        Extremity/Trunk Assessment   Upper Extremity Assessment Upper Extremity Assessment: Generalized weakness    Lower Extremity Assessment Lower Extremity Assessment: Generalized weakness    Cervical / Trunk Assessment Cervical / Trunk Assessment: Kyphotic  Communication   Communication: No difficulties  Cognition Arousal/Alertness: Awake/alert Behavior During Therapy: WFL for tasks assessed/performed Overall Cognitive Status: History of cognitive impairments - at baseline                                 General Comments: very pleasant and followed commands well, hx of memory loss. Poor awareness of deficits in general. A&Ox2 (disoriented to situation and month)        General Comments      Exercises     Assessment/Plan    PT Assessment Patient needs continued PT services  PT Problem List Decreased strength;Decreased cognition;Decreased knowledge of use of DME;Decreased safety awareness;Decreased balance;Decreased mobility;Decreased coordination       PT Treatment Interventions DME instruction;Balance training;Gait training;Neuromuscular re-education;Functional mobility training;Patient/family education;Therapeutic activities;Therapeutic exercise    PT Goals (Current goals can be found in the Care Plan section)  Acute Rehab PT Goals Patient Stated Goal: go home PT Goal Formulation: With patient Time For Goal Achievement: 10/09/22 Potential to Achieve Goals: Good    Frequency Min 3X/week     Co-evaluation               AM-PAC PT "6 Clicks" Mobility  Outcome Measure Help needed turning from your back to your side while in a flat bed without using bedrails?: None Help needed moving from lying on your back to sitting on the side of a flat bed without using bedrails?: None Help needed moving to and from a bed to a chair (including a wheelchair)?: A Little Help needed standing up from a chair  using your arms (e.g., wheelchair or bedside chair)?: A Little Help needed to walk in hospital room?: A Little Help needed climbing 3-5 steps with a railing? : A Little 6 Click Score: 20    End of Session   Activity Tolerance: Patient tolerated treatment well Patient left: in chair;with call bell/phone within reach;with chair alarm set Nurse Communication: Mobility status PT Visit Diagnosis: Unsteadiness on feet (R26.81);Muscle weakness (generalized) (M62.81);Difficulty in walking, not elsewhere classified (R26.2)    Time: [id number] PT Time Calculation (min) (ACUTE ONLY): 18 min   Charges:   PT Evaluation $PT Eval Low Complexity: 1 Low         Deniece Ree PT DPT PN2

## 2022-09-25 NOTE — TOC Progression Note (Signed)
Transition of Care [facility]) - Progression Note    Patient Details  Name: [name] [name] MRN: [id number] Date of Birth: 03/10/1935  Transition of Care [facility]) CM/SW [place], RN Phone [phone]  09/25/2022, 10:33 AM  Clinical Narrative:     Transition of Care [facility]) Screening Note   Patient Details  Name: [name] [name] Date of Birth: October 01, 1934   Transition of Care [facility]) CM/SW Contact:    Angelita Ingles, RN Phone Number: 09/25/2022, 10:33 AM    Transition of Care Department [facility]) has reviewed patient and no TOC needs have been identified at this time. We will continue to monitor patient advancement through interdisciplinary progression rounds. TOC will follow for any disposition needs.           Expected Discharge Plan and Services                                               Social Determinants of Health (SDOH) Interventions SDOH Screenings   Food Insecurity: No Food Insecurity (09/24/2022)  Housing: Low Risk  (09/24/2022)  Transportation Needs: No Transportation Needs (09/24/2022)  Utilities: Not At Risk (09/24/2022)  Alcohol Screen: Low Risk  (07/31/2021)  Depression (PHQ2-9): Low Risk  (08/08/2022)  Financial Resource Strain: Low Risk  (08/08/2022)  Physical Activity: Insufficiently Active (08/08/2022)  Social Connections: Moderately Isolated (07/31/2021)  Stress: No Stress Concern Present (08/08/2022)  Tobacco Use: Low Risk  (09/24/2022)    Readmission Risk Interventions     No data to display

## 2022-09-25 NOTE — Progress Notes (Signed)
PROGRESS NOTE    [name] [name]  [id number] DOB: 07/17/1935 DOA: 09/24/2022 PCP: Libby Maw, MD     Brief Narrative:  [name] [name] is an 88 yo female with medical history significant for rheumatoid arthritis, large granular lymphocytic leukemia, hypertension, hypothyroidism, and CKD 3A who presents to the emergency department with concern for dehydration after recent diarrhea.  Reportedly, patient was feeling unwell on Valentine's Day, had severe diarrhea throughout the following day.  No nausea or vomiting.  Patient lives at home and is fully independent, but has had some memory loss over the years.  Diarrhea has now resolved.  Per son, patient has poor oral intake at baseline, eats 1 large meal per day which is her normal.  New events last 24 hours / Subjective: No new complaints today, eating breakfast on exam.  Assessment & Plan:   Principal Problem:   Acute renal failure superimposed on stage 3a chronic kidney disease (HCC) Active Problems:   Hypothyroidism   Essential hypertension   Rheumatoid arthritis (HCC)   Neutropenia (HCC)   Iron deficiency anemia   Large granular lymphocytic leukemia (HCC)   Impaired mental alertness   AKI on CKD stage IIIa -Insetting of GI loss, decreased oral intake -Hold ACE inhibitor -Continue IV fluids today  Hypothyroidism -Patient has not been taking her Synthroid in about 6 to 8 months, medication error from pharmacy -Resume Synthroid.  Will need repeat TSH and free T4 as outpatient in 4 to 6 weeks  Memory loss -Outpatient follow-up  Rheumatoid arthritis -Leflunomide  Large granular lymphocytic leukemia -Followed by Dr. Marin Olp  Hypertension -Hold ACE inhibitor due to AKI -Blood pressure stable today   DVT prophylaxis:  heparin injection 5,000 Units Start: 09/24/22 2200  Code Status: DNR Family Communication: Son at bedside Disposition Plan:  Status is: Inpatient Remains inpatient appropriate because:  IVF    Antimicrobials:  Anti-infectives (From admission, onward)    None        Objective: Vitals:   09/24/22 2203 09/24/22 2306 09/25/22 0311 09/25/22 0820  BP: (!) 122/57 (!) 120/56 129/63 131/64  Pulse: 89 85 93 90  Resp: 18 18 18 18  $ Temp: 98.4 F (36.9 C) 97.6 F (36.4 C) 98.8 F (37.1 C) 98.9 F (37.2 C)  TempSrc: Oral Oral Oral Oral  SpO2: 100% 100% 98% 99%  Weight:  51.3 kg    Height:  5' 1.5" (1.562 m)      Intake/Output Summary (Last 24 hours) at 09/25/2022 1042 Last data filed at 09/25/2022 0306 Gross per 24 hour  Intake 1638.01 ml  Output --  Net 1638.01 ml   Filed Weights   09/24/22 2306  Weight: 51.3 kg    Examination:  General exam: Appears calm and comfortable  Respiratory system: Clear to auscultation. Respiratory effort normal. No respiratory distress. No conversational dyspnea.  Cardiovascular system: S1 & S2 heard, RRR. No murmurs. No pedal edema. Gastrointestinal system: Abdomen is nondistended, soft and nontender. Normal bowel sounds heard. Central nervous system: Alert and oriented. No focal neurological deficits. Speech clear.  Extremities: Symmetric in appearance  Skin: No rashes, lesions or ulcers on exposed skin  Psychiatry: Judgement and insight appear normal. Mood & affect appropriate.   Data Reviewed: I have personally reviewed following labs and imaging studies  CBC: Recent Labs  Lab 09/24/22 1739 09/25/22 0636  WBC 7.2 5.7  NEUTROABS 0.1* 0.1*  HGB 9.8* 8.6*  HCT 32.3* 27.9*  MCV 82.2 81.1  PLT 346 325   Basic  Metabolic Panel: Recent Labs  Lab 09/24/22 1739 09/25/22 0636  NA 138 140  K 4.4 4.2  CL 105 112*  CO2 24 21*  GLUCOSE 110* 93  BUN 49* 39*  CREATININE 2.16* 1.75*  CALCIUM 8.9 8.0*  MG  --  2.0  PHOS  --  2.9   GFR: Estimated Creatinine Clearance: 18.2 mL/min (A) (by C-G formula based on SCr of 1.75 mg/dL (H)). Liver Function Tests: Recent Labs  Lab 09/24/22 1739  AST 25  ALT 18  ALKPHOS 52   BILITOT 0.7  PROT 7.4  ALBUMIN 3.4*   Recent Labs  Lab 09/24/22 1739  LIPASE 42   No results for input(s): "AMMONIA" in the last 168 hours. Coagulation Profile: No results for input(s): "INR", "PROTIME" in the last 168 hours. Cardiac Enzymes: No results for input(s): "CKTOTAL", "CKMB", "CKMBINDEX", "TROPONINI" in the last 168 hours. BNP (last 3 results) No results for input(s): "PROBNP" in the last 8760 hours. HbA1C: No results for input(s): "HGBA1C" in the last 72 hours. CBG: No results for input(s): "GLUCAP" in the last 168 hours. Lipid Profile: No results for input(s): "CHOL", "HDL", "LDLCALC", "TRIG", "CHOLHDL", "LDLDIRECT" in the last 72 hours. Thyroid Function Tests: Recent Labs    09/24/22 1739  TSH 43.150*  FREET4 0.75   Anemia Panel: Recent Labs    09/25/22 0636 09/25/22 0637  VITAMINB12 6,651*  --   FOLATE 7.0  --   FERRITIN 266  --   TIBC 193*  --   IRON 28  --   RETICCTPCT  --  1.6   Sepsis Labs: No results for input(s): "PROCALCITON", "LATICACIDVEN" in the last 168 hours.  No results found for this or any previous visit (from the past 240 hour(s)).    Radiology Studies: CT CHEST ABDOMEN PELVIS WO CONTRAST  Result Date: 09/24/2022 CLINICAL DATA:  Groin lymphadenopathy. Weakness. Weight loss. History of leukemia. Diarrhea. * Tracking Code: BO * EXAM: CT CHEST, ABDOMEN AND PELVIS WITHOUT CONTRAST TECHNIQUE: Multidetector CT imaging of the chest, abdomen and pelvis was performed following the standard protocol without IV contrast. RADIATION DOSE REDUCTION: This exam was performed according to the departmental dose-optimization program which includes automated exposure control, adjustment of the mA and/or kV according to patient size and/or use of iterative reconstruction technique. COMPARISON:  No comparison CTs FINDINGS: CT CHEST FINDINGS Cardiovascular: Aortic atherosclerosis. Tortuous thoracic aorta. Mild cardiomegaly, without pericardial effusion.  Multivessel coronary artery atherosclerosis. Aortic valve calcifications are nonspecific in this age group. Mediastinum/Nodes: No axillary or subpectoral adenopathy. No mediastinal or hilar adenopathy, given limitations of unenhanced CT. Upper esophageal fluid level on 23/2. Lungs/Pleura: No pleural fluid. Mild biapical pleuroparenchymal scarring. Mild bibasilar scarring. Left apical 4 mm pulmonary nodule on 35/6 is solid. A vague 3 mm right upper lobe pulmonary nodule on 39/6 may be subsolid. Musculoskeletal: Cervical spine fixation. CT ABDOMEN PELVIS FINDINGS Hepatobiliary: Scattered hepatic well-circumscribed low-density lesions of up to 11 mm are favored to represent cysts. Cholecystectomy, without biliary ductal dilatation. Pancreas: Normal, without mass or ductal dilatation. Spleen: Normal in size, without focal abnormality. Adrenals/Urinary Tract: Normal adrenal glands. No renal calculi or hydronephrosis. No bladder calculi. Stomach/Bowel: Proximal gastric underdistention. Normal colon and terminal ileum. Appendix not visualized. Normal small bowel. Vascular/Lymphatic: Aortic atherosclerosis. No abdominopelvic adenopathy. Reproductive: Hysterectomy.  No adnexal mass. Other: No significant free fluid. No free intraperitoneal air. Calcified left abdominal wall nodule of 1.6 cm is nonspecific. Musculoskeletal: Mild degenerative changes of both hips. Lumbosacral spondylosis. IMPRESSION: 1. No evidence of  adenopathy, including within the groins. 2. Mildly decreased sensitivity exam secondary to noncontrast technique. 3.  No acute findings. 4. Tiny bilateral upper lobe pulmonary nodules. No follow-up needed if patient is low-risk. Non-contrast chest CT can be considered in 12 months if patient is high-risk, nodule is upper lobe, and/or suspicious in morphology. This recommendation follows the consensus statement: Guidelines for Management of Incidental Pulmonary Nodules Detected on CT Images: From the Fleischner  Society 2017; Radiology 2017; 284:228-243. 5. Esophageal air fluid level suggests dysmotility or gastroesophageal reflux. 6. Coronary artery atherosclerosis. Aortic Atherosclerosis (ICD10-I70.0). Electronically Signed   By: Abigail Miyamoto M.D.   On: 09/24/2022 20:38      Scheduled Meds:  heparin  5,000 Units Subcutaneous Q8H   leflunomide  10 mg Oral Daily   levothyroxine  75 mcg Oral Q0600   Continuous Infusions:  sodium chloride 125 mL/hr at 09/25/22 0609     LOS: 1 day   Time spent: 35 minutes   Dessa Phi, DO [facility] 09/25/2022, 10:42 AM   Available via Epic secure chat 7am-7pm After these hours, please refer to coverage provider listed on [email or web address]

## 2022-09-25 NOTE — Progress Notes (Signed)
Initial Nutrition Assessment  DOCUMENTATION CODES:   Severe malnutrition in context of chronic illness  INTERVENTION:   -Ensure Plus High Protein po BID, each supplement provides 350 kcal and 20 grams of protein.   -Multivitamin with minerals daily  NUTRITION DIAGNOSIS:   Severe Malnutrition related to chronic illness (leukemia) as evidenced by moderate fat depletion, severe muscle depletion, energy intake < or equal to 75% for > or equal to 1 month.  GOAL:   Patient will meet greater than or equal to 90% of their needs  MONITOR:   PO intake, Supplement acceptance, Labs, Weight trends, I & O's  REASON FOR ASSESSMENT:   Malnutrition Screening Tool    ASSESSMENT:   88 yo female with medical history significant for rheumatoid arthritis, large granular lymphocytic leukemia, hypertension, hypothyroidism, and CKD 3A who presents to the emergency department with concern for dehydration after recent diarrhea.  Reportedly, patient was feeling unwell on Valentine's Day, had severe diarrhea throughout the following day.  Patient in room, son at bedside. Reports pt ate eggs and grits this morning. She is light eater per son. Pt will eat a light breakfast and then she likes food like pizza for dinner. He goes grocery shopping for her and gets frozen meats for her to prepare when she wants. States he has noticed lately he will go to restock these items and some items are still in there. Pt was not taking her synthroid so will resume that.  Pt started feeling bad 2/14, had diarrhea which has now resolved. Denies issues with swallowing or chewing.  She is agreeable to trying Ensure supplements.  Per pt's son, pt's UBW is ~119 lbs. Current weight: 113 lbs  Medications reviewed.  Labs reviewed.  NUTRITION - FOCUSED PHYSICAL EXAM:  Flowsheet Row Most Recent Value  Orbital Region Moderate depletion  Upper Arm Region Moderate depletion  Thoracic and Lumbar Region Unable to assess  Buccal  Region Moderate depletion  Temple Region Severe depletion  Clavicle Bone Region Severe depletion  Clavicle and Acromion Bone Region Severe depletion  Scapular Bone Region Severe depletion  Dorsal Hand Severe depletion  Patellar Region Severe depletion  Anterior Thigh Region Severe depletion  Posterior Calf Region Severe depletion  Edema (RD Assessment) Mild  Hair Reviewed  Eyes Reviewed  Mouth Reviewed  Skin Reviewed       Diet Order:   Diet Order             Diet regular Room service appropriate? Yes; Fluid consistency: Thin  Diet effective now                   EDUCATION NEEDS:   No education needs have been identified at this time  Skin:  Skin Assessment: Reviewed RN Assessment  Last BM:  2/18  Height:   Ht Readings from Last 1 Encounters:  09/24/22 5' 1.5" (1.562 m)    Weight:   Wt Readings from Last 1 Encounters:  09/24/22 51.3 kg    BMI:  Body mass index is 21.02 kg/m.  Estimated Nutritional Needs:   Kcal:  1350-1550  Protein:  65-75g  Fluid:  1.5L/day  Clayton Bibles, MS, RD, LDN Inpatient Clinical Dietitian Contact information available via Amion

## 2022-09-26 DIAGNOSIS — N179 Acute kidney failure, unspecified: Secondary | ICD-10-CM | POA: Diagnosis not present

## 2022-09-26 DIAGNOSIS — N1831 Chronic kidney disease, stage 3a: Secondary | ICD-10-CM | POA: Diagnosis not present

## 2022-09-26 LAB — CBC WITH DIFFERENTIAL/PLATELET
Abs Immature Granulocytes: 0 10*3/uL (ref 0.00–0.07)
Basophils Absolute: 0 10*3/uL (ref 0.0–0.1)
Basophils Relative: 1 %
Eosinophils Absolute: 0 10*3/uL (ref 0.0–0.5)
Eosinophils Relative: 1 %
HCT: 27.2 % — ABNORMAL LOW (ref 36.0–46.0)
Hemoglobin: 8.3 g/dL — ABNORMAL LOW (ref 12.0–15.0)
Immature Granulocytes: 0 %
Lymphocytes Relative: 78 %
Lymphs Abs: 5.4 10*3/uL — ABNORMAL HIGH (ref 0.7–4.0)
MCH: 24.5 pg — ABNORMAL LOW (ref 26.0–34.0)
MCHC: 30.5 g/dL (ref 30.0–36.0)
MCV: 80.2 fL (ref 80.0–100.0)
Monocytes Absolute: 1.2 10*3/uL — ABNORMAL HIGH (ref 0.1–1.0)
Monocytes Relative: 17 %
Neutro Abs: 0.2 10*3/uL — CL (ref 1.7–7.7)
Neutrophils Relative %: 3 %
Platelets: 412 10*3/uL — ABNORMAL HIGH (ref 150–400)
RBC: 3.39 MIL/uL — ABNORMAL LOW (ref 3.87–5.11)
RDW: 15.7 % — ABNORMAL HIGH (ref 11.5–15.5)
WBC: 7.1 10*3/uL (ref 4.0–10.5)
nRBC: 0 % (ref 0.0–0.2)

## 2022-09-26 LAB — BASIC METABOLIC PANEL
Anion gap: 9 (ref 5–15)
BUN: 26 mg/dL — ABNORMAL HIGH (ref 8–23)
CO2: 19 mmol/L — ABNORMAL LOW (ref 22–32)
Calcium: 8.1 mg/dL — ABNORMAL LOW (ref 8.9–10.3)
Chloride: 110 mmol/L (ref 98–111)
Creatinine, Ser: 1.54 mg/dL — ABNORMAL HIGH (ref 0.44–1.00)
GFR, Estimated: 33 mL/min — ABNORMAL LOW (ref >60)
Glucose, Bld: 100 mg/dL — ABNORMAL HIGH (ref 70–99)
Potassium: 4 mmol/L (ref 3.5–5.1)
Sodium: 138 mmol/L (ref 135–145)

## 2022-09-26 MED ORDER — LEVOTHYROXINE SODIUM 75 MCG PO TABS: 75.0000 ug | ORAL_TABLET | Freq: Every day | ORAL | 2 refills | Status: DC

## 2022-09-26 NOTE — TOC Transition Note (Signed)
Transition of Care [facility]) - CM/SW Discharge Note   Patient Details  Name: [name] [name] MRN: [id number] Date of Birth: 09-03-1934  [facility]) CM/SW Contact:  Angelita Ingles, RN Phone [phone]  09/26/2022, 10:38 AM   Clinical Narrative:    TOC acknowledges patient with recommendations for outpatient rehab. CM at bedside to discuss recommendation. Son is present and agreeable to outpatient PT. Ambulatory referral has been submitted. AVS updated.    Final next level of care: OP Rehab Barriers to Discharge: No Barriers Identified   Patient Goals and CMS Choice CMS Medicare.gov Compare Post Acute Care list provided to:: Patient Represenative (must comment) Choice offered to / list presented to : Adult Children  Discharge Placement                         Discharge Plan and Services Additional resources added to the After Visit Summary for   In-house Referral: NA Discharge Planning Services: CM Consult Post Acute Care Choice:  (ambulatory referral)          DME Arranged: N/A                    Social Determinants of Health (SDOH) Interventions SDOH Screenings   Food Insecurity: No Food Insecurity (09/24/2022)  Housing: Low Risk  (09/24/2022)  Transportation Needs: No Transportation Needs (09/24/2022)  Utilities: Not At Risk (09/24/2022)  Alcohol Screen: Low Risk  (07/31/2021)  Depression (PHQ2-9): Low Risk  (08/08/2022)  Financial Resource Strain: Low Risk  (08/08/2022)  Physical Activity: Insufficiently Active (08/08/2022)  Social Connections: Moderately Isolated (07/31/2021)  Stress: No Stress Concern Present (08/08/2022)  Tobacco Use: Low Risk  (09/24/2022)     Readmission Risk Interventions    09/26/2022   10:27 AM  Readmission Risk Prevention Plan  Transportation Screening Complete  PCP or Specialist Appt within 5-7 Days Complete  Home Care Screening Complete  Medication Review (RN CM) Complete

## 2022-09-26 NOTE — Discharge Summary (Addendum)
Physician Discharge Summary  [name] [name] [name] [name] DOB: July 18, 1935 DOA: 09/24/2022  PCP: Libby Maw, MD  Admit date: 09/24/2022 Discharge date: 09/26/2022  Admitted From: Home Disposition:  Home   Recommendations for Outpatient Follow-up:  Follow up with PCP  Recommend repeat BMP to ensure resolution of AKI. Cr improving at day of discharge.  Repeat TSH and free T4 as outpatient in 4 to 6 weeks  Discharge Condition: Stable CODE STATUS: DNR  Diet recommendation: Regular   Brief/Interim Summary: [name] [name] is an 88 yo female with medical history significant for rheumatoid arthritis, large granular lymphocytic leukemia, hypertension, hypothyroidism, and CKD 3A who presents to the emergency department with concern for dehydration after recent diarrhea.  Reportedly, patient was feeling unwell on Valentine's Day, had severe diarrhea throughout the following day.  No nausea or vomiting.  Patient lives at home and is fully independent, but has had some memory loss over the years.  Diarrhea has now resolved.  Per son, patient has poor oral intake at baseline, eats 1 large meal per day which is her normal.  Patient was admitted for acute kidney injury.  Her ACE inhibitor was held and patient was given IV fluids with improvement in her creatinine.  She was tolerating p.o. intake and was discharged home in stable condition.  Discharge Diagnoses:   Principal Problem:   Acute renal failure superimposed on stage 3a chronic kidney disease (HCC) Active Problems:   Hypothyroidism   Essential hypertension   Rheumatoid arthritis (HCC)   Neutropenia (HCC)   Iron deficiency anemia   Large granular lymphocytic leukemia (HCC)   Impaired mental alertness   Protein-calorie malnutrition, severe   AKI on CKD stage IIIa -Insetting of GI loss, decreased oral intake -Improved   Hypothyroidism -Patient has not been taking her Synthroid in about 6 to 8 months, medication error from  pharmacy -Resume Synthroid.  Will need repeat TSH and free T4 as outpatient in 4 to 6 weeks   Memory loss -Outpatient follow-up   Rheumatoid arthritis -Leflunomide   Large granular lymphocytic leukemia -Followed by Dr. Marin Olp   Hypertension -Hold ACE inhibitor due to AKI.  Can resume as outpatient once creatinine returns back to normal  Discharge Instructions  Discharge Instructions     Ambulatory referral to Physical Therapy   Complete by: As directed    Call MD for:  difficulty breathing, headache or visual disturbances   Complete by: As directed    Call MD for:  extreme fatigue   Complete by: As directed    Call MD for:  persistant dizziness or light-headedness   Complete by: As directed    Call MD for:  persistant nausea and vomiting   Complete by: As directed    Call MD for:  severe uncontrolled pain   Complete by: As directed    Call MD for:  temperature >100.4   Complete by: As directed    Diet general   Complete by: As directed    Discharge instructions   Complete by: As directed    You were cared for by a hospitalist during your hospital stay. If you have any questions about your discharge medications or the care you received while you were in the hospital after you are discharged, you can call the unit and ask to speak with the hospitalist on call if the hospitalist that took care of you is not available. Once you are discharged, your primary care physician will handle any further medical issues. Please note  that NO REFILLS for any discharge medications will be authorized once you are discharged, as it is imperative that you return to your primary care physician (or establish a relationship with a primary care physician if you do not have one) for your aftercare needs so that they can reassess your need for medications and monitor your lab values.   Discharge instructions   Complete by: As directed    HOLD BENAZEPRIL '10MG'$  UNTIL REPEAT LAB WORK AS OUTPATIENT. RESUME  AT DISCRETION OF PCP ONCE KIDNEY FUNCTION IS NORMAL.   Increase activity slowly   Complete by: As directed       Allergies as of 09/26/2022       Reactions   Tramadol Nausea Only   Codeine Phosphate Nausea Only   REACTION: unspecified        Medication List     STOP taking these medications    benazepril 5 MG tablet Commonly known as: LOTENSIN   pravastatin 20 MG tablet Commonly known as: PRAVACHOL       TAKE these medications    acetaminophen 650 MG CR tablet Commonly known as: TYLENOL Take 650 mg by mouth every 8 (eight) hours as needed for pain.   Calcium Citrate Plus/Magnesium Tabs Take 1 tablet by mouth daily.   Januvia 25 MG tablet Generic drug: sitaGLIPtin TAKE 1 TABLET (25 MG TOTAL) BY MOUTH DAILY. What changed: how much to take   leflunomide 10 MG tablet Commonly known as: ARAVA TAKE 1 TABLET BY MOUTH EVERY DAY   levothyroxine 75 MCG tablet Commonly known as: SYNTHROID Take 1 tablet (75 mcg total) by mouth daily before breakfast.   methotrexate 10 MG tablet Commonly known as: RHEUMATREX Take 2 tablets (20 mg total) by mouth once a week.   methotrexate 2.5 MG tablet Commonly known as: RHEUMATREX Take 5 mg by mouth. Monday Wednesday friday   SYSTANE OP Apply to eye as needed.        Follow-up Information     Libby Maw, MD Follow up.   Specialty: Family Medicine Why: Recommend repeat BMP to ensure resolution of AKI. Cr improving at day of discharge. Benazepril held until Cr normalizes.   Repeat TSH and free T4 as outpatient in 4 to 6 weeks Contact information: [place] [address] [phone]                Allergies  Allergen Reactions   Tramadol Nausea Only   Codeine Phosphate Nausea Only    REACTION: unspecified    Consultations: None    Procedures/Studies: CT CHEST ABDOMEN PELVIS WO CONTRAST  Result Date: 09/24/2022 CLINICAL DATA:  Groin lymphadenopathy. Weakness. Weight  loss. History of leukemia. Diarrhea. * Tracking Code: BO * EXAM: CT CHEST, ABDOMEN AND PELVIS WITHOUT CONTRAST TECHNIQUE: Multidetector CT imaging of the chest, abdomen and pelvis was performed following the standard protocol without IV contrast. RADIATION DOSE REDUCTION: This exam was performed according to the departmental dose-optimization program which includes automated exposure control, adjustment of the mA and/or kV according to patient size and/or use of iterative reconstruction technique. COMPARISON:  No comparison CTs FINDINGS: CT CHEST FINDINGS Cardiovascular: Aortic atherosclerosis. Tortuous thoracic aorta. Mild cardiomegaly, without pericardial effusion. Multivessel coronary artery atherosclerosis. Aortic valve calcifications are nonspecific in this age group. Mediastinum/Nodes: No axillary or subpectoral adenopathy. No mediastinal or hilar adenopathy, given limitations of unenhanced CT. Upper esophageal fluid level on 23/2. Lungs/Pleura: No pleural fluid. Mild biapical pleuroparenchymal scarring. Mild bibasilar scarring. Left apical 4 mm  pulmonary nodule on 35/6 is solid. A vague 3 mm right upper lobe pulmonary nodule on 39/6 may be subsolid. Musculoskeletal: Cervical spine fixation. CT ABDOMEN PELVIS FINDINGS Hepatobiliary: Scattered hepatic well-circumscribed low-density lesions of up to 11 mm are favored to represent cysts. Cholecystectomy, without biliary ductal dilatation. Pancreas: Normal, without mass or ductal dilatation. Spleen: Normal in size, without focal abnormality. Adrenals/Urinary Tract: Normal adrenal glands. No renal calculi or hydronephrosis. No bladder calculi. Stomach/Bowel: Proximal gastric underdistention. Normal colon and terminal ileum. Appendix not visualized. Normal small bowel. Vascular/Lymphatic: Aortic atherosclerosis. No abdominopelvic adenopathy. Reproductive: Hysterectomy.  No adnexal mass. Other: No significant free fluid. No free intraperitoneal air. Calcified left  abdominal wall nodule of 1.6 cm is nonspecific. Musculoskeletal: Mild degenerative changes of both hips. Lumbosacral spondylosis. IMPRESSION: 1. No evidence of adenopathy, including within the groins. 2. Mildly decreased sensitivity exam secondary to noncontrast technique. 3.  No acute findings. 4. Tiny bilateral upper lobe pulmonary nodules. No follow-up needed if patient is low-risk. Non-contrast chest CT can be considered in 12 months if patient is high-risk, nodule is upper lobe, and/or suspicious in morphology. This recommendation follows the consensus statement: Guidelines for Management of Incidental Pulmonary Nodules Detected on CT Images: From the Fleischner Society 2017; Radiology 2017; 284:228-243. 5. Esophageal air fluid level suggests dysmotility or gastroesophageal reflux. 6. Coronary artery atherosclerosis. Aortic Atherosclerosis (ICD10-I70.0). Electronically Signed   By: Abigail Miyamoto M.D.   On: 09/24/2022 20:38       Discharge Exam: Vitals:   09/25/22 2251 09/26/22 0547  BP: (!) 131/58 (!) 146/71  Pulse: (!) 101 (!) 101  Resp: 17 17  Temp: 98.5 F (36.9 C) 98.2 F (36.8 C)  SpO2: 97% 97%    General: Pt is alert, awake, not in acute distress Cardiovascular: RRR, S1/S2 +, no edema Respiratory: CTA bilaterally, no wheezing, no rhonchi, no respiratory distress, no conversational dyspnea  Abdominal: Soft, NT, ND, bowel sounds + Extremities: no edema, no cyanosis Psych: Normal mood and affect, stable judgement and insight     The results of significant diagnostics from this hospitalization (including imaging, microbiology, ancillary and laboratory) are listed below for reference.     Microbiology: No results found for this or any previous visit (from the past 240 hour(s)).   Labs: BNP (last 3 results) No results for input(s): "BNP" in the last 8760 hours. Basic Metabolic Panel: Recent Labs  Lab 09/24/22 1739 09/25/22 0636 09/26/22 0736  NA 138 140 138  K 4.4 4.2 4.0   CL 105 112* 110  CO2 24 21* 19*  GLUCOSE 110* 93 100*  BUN 49* 39* 26*  CREATININE 2.16* 1.75* 1.54*  CALCIUM 8.9 8.0* 8.1*  MG  --  2.0  --   PHOS  --  2.9  --    Liver Function Tests: Recent Labs  Lab 09/24/22 1739  AST 25  ALT 18  ALKPHOS 52  BILITOT 0.7  PROT 7.4  ALBUMIN 3.4*   Recent Labs  Lab 09/24/22 1739  LIPASE 42   No results for input(s): "AMMONIA" in the last 168 hours. CBC: Recent Labs  Lab 09/24/22 1739 09/25/22 0636 09/26/22 0736  WBC 7.2 5.7 7.1  NEUTROABS 0.1* 0.1* 0.2*  HGB 9.8* 8.6* 8.3*  HCT 32.3* 27.9* 27.2*  MCV 82.2 81.1 80.2  PLT 346 325 412*   Cardiac Enzymes: No results for input(s): "CKTOTAL", "CKMB", "CKMBINDEX", "TROPONINI" in the last 168 hours. BNP: Invalid input(s): "POCBNP" CBG: No results for input(s): "GLUCAP" in the last 168  hours. D-Dimer No results for input(s): "DDIMER" in the last 72 hours. Hgb A1c No results for input(s): "HGBA1C" in the last 72 hours. Lipid Profile No results for input(s): "CHOL", "HDL", "LDLCALC", "TRIG", "CHOLHDL", "LDLDIRECT" in the last 72 hours. Thyroid function studies Recent Labs    09/24/22 1739  TSH 43.150*   Anemia work up Recent Labs    09/25/22 0636 09/25/22 0637  VITAMINB12 6,651*  --   FOLATE 7.0  --   FERRITIN 266  --   TIBC 193*  --   IRON 28  --   RETICCTPCT  --  1.6   Urinalysis    Component Value Date/Time   COLORURINE YELLOW 09/24/2022 2203   APPEARANCEUR HAZY (A) 09/24/2022 2203   LABSPEC 1.014 09/24/2022 2203   PHURINE 5.0 09/24/2022 2203   GLUCOSEU NEGATIVE 09/24/2022 2203   GLUCOSEU NEGATIVE 04/23/2022 1104   HGBUR NEGATIVE 09/24/2022 2203   BILIRUBINUR NEGATIVE 09/24/2022 2203   KETONESUR NEGATIVE 09/24/2022 2203   PROTEINUR NEGATIVE 09/24/2022 2203   UROBILINOGEN 0.2 04/23/2022 1104   NITRITE NEGATIVE 09/24/2022 2203   LEUKOCYTESUR NEGATIVE 09/24/2022 2203   Sepsis Labs Recent Labs  Lab 09/24/22 1739 09/25/22 0636 09/26/22 0736  WBC 7.2  5.7 7.1   Microbiology No results found for this or any previous visit (from the past 240 hour(s)).   Patient was seen and examined on the day of discharge and was found to be in stable condition. Time coordinating discharge: 25 minutes including assessment and coordination of care, as well as examination of the patient.   SIGNED:  Dessa Phi, DO [facility] 09/26/2022, 10:23 AM

## 2022-09-26 NOTE — Progress Notes (Signed)
Physical Therapy Treatment Patient Details Name: [name] [name] MRN: [id number] DOB: 04/23/1935 Today's Date: 09/26/2022   History of Present Illness 88yo F who presented to the ED with concerns of dehydration after severe diarrhea mid-February. Admitted with AKI on CKD IIIa. PMH HLD, HTN, hypothyroidism, RA, neck surgery    PT Comments    Pt eager to d/c home today.  Son present and reports pt typically uses [facility] and furniture/wall walks at home.  Pt would benefit from bil UE support like RW upon d/c if she will remember to use.  Son reports rollator is too bulky for home.  Son reports pt will have supervision upon d/c and anticipates f/u with OPPT. Son has access to a RW and pt would also benefit from shower chair upon d/c.    Recommendations for follow up therapy are one component of a multi-disciplinary discharge planning process, led by the attending physician.  Recommendations may be updated based on patient status, additional functional criteria and insurance authorization.  Follow Up Recommendations  Outpatient PT     Assistance Recommended at Discharge Intermittent Supervision/Assistance  Patient can return home with the following A little help with walking and/or transfers;Direct supervision/assist for medications management;Direct supervision/assist for financial management;Assistance with cooking/housework;Assist for transportation;A little help with bathing/dressing/bathroom   Equipment Recommendations  Rolling walker (2 wheels);Other (comment) (son believes they have a RW, would benefit from shower chair)    Recommendations for Other Services       Precautions / Restrictions Precautions Precautions: Fall Restrictions Weight Bearing Restrictions: No     Mobility  Bed Mobility Overal bed mobility: Modified Independent                  Transfers Overall transfer level: Needs assistance Equipment used: Rollator (4 wheels) Transfers: Sit to/from Stand Sit  to Stand: Supervision           General transfer comment: cues for brakes    Ambulation/Gait Ambulation/Gait assistance: Min guard, Supervision Gait Distance (Feet): 250 Feet Assistive device: Rollator (4 wheels) Gait Pattern/deviations: Step-through pattern, Decreased stride length Gait velocity: decreased     General Gait Details: no unsteadiness observed with Bil UE support, pt used rollator today however son present and feels it would be too bulky for her home   Stairs             Wheelchair Mobility    Modified Rankin (Stroke Patients Only)       Balance             Standing balance-Leahy Scale: Fair Standing balance comment: static fair                            Cognition Arousal/Alertness: Awake/alert Behavior During Therapy: WFL for tasks assessed/performed Overall Cognitive Status: History of cognitive impairments - at baseline                                 General Comments: very pleasant and followed commands well, hx of memory loss, son present and reports her cognition worsened prior to admission due to her not taking her thyroid medication, has been slowly improving since admission        Exercises      General Comments        Pertinent Vitals/Pain Pain Assessment Pain Assessment: No/denies pain    Home Living  Prior Function            PT Goals (current goals can now be found in the care plan section) Progress towards PT goals: Progressing toward goals    Frequency    Min 3X/week      PT Plan Current plan remains appropriate    Co-evaluation              AM-PAC PT "6 Clicks" Mobility   Outcome Measure  Help needed turning from your back to your side while in a flat bed without using bedrails?: None Help needed moving from lying on your back to sitting on the side of a flat bed without using bedrails?: None Help needed moving to and from a bed  to a chair (including a wheelchair)?: A Little Help needed standing up from a chair using your arms (e.g., wheelchair or bedside chair)?: A Little Help needed to walk in hospital room?: A Little Help needed climbing 3-5 steps with a railing? : A Little 6 Click Score: 20    End of Session Equipment Utilized During Treatment: Gait belt Activity Tolerance: Patient tolerated treatment well Patient left: in bed;with call bell/phone within reach;with family/visitor present;with nursing/sitter in room   PT Visit Diagnosis: Muscle weakness (generalized) (M62.81);Difficulty in walking, not elsewhere classified (R26.2)     Time: 1028-1050 PT Time Calculation (min) (ACUTE ONLY): 22 min  Charges:  $Gait Training: 8-22 mins                    Jannette Spanner PT, DPT Physical Therapist Acute Rehabilitation Services Preferred contact method: Secure Chat Weekend Pager Only: [phone] Office: [place] 09/26/2022, 11:31 AM

## 2022-09-29 ENCOUNTER — Encounter: Payer: Self-pay | Admitting: *Deleted

## 2022-09-29 ENCOUNTER — Telehealth: Payer: Self-pay | Admitting: *Deleted

## 2022-09-29 NOTE — Transitions of Care (Post Inpatient/ED Visit) (Signed)
   09/29/2022  Name: [name] [name] MRN: [id number] DOB: [birth year]  Today's TOC FU Call Status: Today's TOC FU Call Status:: Successful TOC FU Call Competed (HIPAA identifiers verified with her son [name], on [facility] DPR- entirety of call today completed with son/ caregiver) TOC FU Call Complete Date: 09/29/22  Transition Care Management Follow-up Telephone Call Date of Discharge: 09/26/22 Discharge Facility: [facility]) Type of Discharge: Inpatient Admission Primary Inpatient Discharge Diagnosis:: AKI secondary to dehydration due to persistent diarrhea How have you been since you were released from the hospital?: Better Any questions or concerns?: No  Items Reviewed: Did you receive and understand the discharge instructions provided?: Yes Medications obtained and verified?: Yes (Medications Reviewed) (full medication review completed; no concerns or discrepancies identified; son manages all aspects of medication administration; denies concerns/ questions aorund medications) Any new allergies since your discharge?: No Dietary orders reviewed?: Yes Type of Diet Ordered:: Regular Do you have support at home?: Yes People in Home: alone Name of Support/Comfort Primary Source: son- primary caregiver; reports patient is essentially independent in self-care activities  [place] and Equipment/Supplies: [place] Ordered?: No Any new equipment or medical supplies ordered?: No  Functional Questionnaire: Do you need assistance with bathing/showering or dressing?: No Do you need assistance with meal preparation?: No Do you need assistance with eating?: No Do you have difficulty maintaining continence: No Do you need assistance with getting out of bed/getting out of a chair/moving?: No Do you have difficulty managing or taking your medications?: Yes (son manages all aspects of medication administration)  Folllow up appointments reviewed: PCP Follow-up appointment  confirmed?: Yes Date of PCP follow-up appointment?: 10/09/22 Follow-up Provider: PCP [facility] Follow-up appointment confirmed?: Yes Date of Specialist follow-up appointment?: 10/03/22 Follow-Up Specialty Provider:: oncology provider Do you need transportation to your follow-up appointment?: No Do you understand care options if your condition(s) worsen?: Yes-patient verbalized understanding  SDOH Interventions Today    Flowsheet Row Most Recent Value  SDOH Interventions   Food Insecurity Interventions Intervention Not Indicated  Transportation Interventions Intervention Not Indicated  [son/ caregiver provides transportation]      TOC Interventions Today    Flowsheet Row Most Recent Value  TOC Interventions   TOC Interventions Discussed/Reviewed TOC Interventions Discussed  [provided my direct contact information should questions/ concerns/ needs arise post- TOC call today]      Interventions Today    Flowsheet Row Most Recent Value  Chronic Disease   Chronic disease during today's visit Other  General Interventions   General Interventions Discussed/Reviewed Doctor Visits  Doctor Visits Discussed/Reviewed Doctor Visits Discussed, PCP, Specialist  PCP/Specialist Visits Compliance with follow-up visit  Nutrition Interventions   Nutrition Discussed/Reviewed Nutrition Discussed, Fluid intake  Pharmacy Interventions   Pharmacy Dicussed/Reviewed Pharmacy Topics Discussed  [full medication review completed]      Oneta Rack, RN, BSN, CCRN Alumnus RN CM Care Coordination/ Transition of [place] [facility] [phone]: direct office

## 2022-09-30 DIAGNOSIS — M1990 Unspecified osteoarthritis, unspecified site: Secondary | ICD-10-CM | POA: Diagnosis not present

## 2022-09-30 DIAGNOSIS — Z008 Encounter for other general examination: Secondary | ICD-10-CM | POA: Diagnosis not present

## 2022-09-30 DIAGNOSIS — D8481 Immunodeficiency due to conditions classified elsewhere: Secondary | ICD-10-CM | POA: Diagnosis not present

## 2022-09-30 DIAGNOSIS — Z833 Family history of diabetes mellitus: Secondary | ICD-10-CM | POA: Diagnosis not present

## 2022-09-30 DIAGNOSIS — Z809 Family history of malignant neoplasm, unspecified: Secondary | ICD-10-CM | POA: Diagnosis not present

## 2022-09-30 DIAGNOSIS — Z8249 Family history of ischemic heart disease and other diseases of the circulatory system: Secondary | ICD-10-CM | POA: Diagnosis not present

## 2022-09-30 DIAGNOSIS — C9510 Chronic leukemia of unspecified cell type not having achieved remission: Secondary | ICD-10-CM | POA: Diagnosis not present

## 2022-09-30 DIAGNOSIS — D84821 Immunodeficiency due to drugs: Secondary | ICD-10-CM | POA: Diagnosis not present

## 2022-09-30 DIAGNOSIS — M069 Rheumatoid arthritis, unspecified: Secondary | ICD-10-CM | POA: Diagnosis not present

## 2022-09-30 DIAGNOSIS — Z885 Allergy status to narcotic agent status: Secondary | ICD-10-CM | POA: Diagnosis not present

## 2022-09-30 DIAGNOSIS — I10 Essential (primary) hypertension: Secondary | ICD-10-CM | POA: Diagnosis not present

## 2022-09-30 DIAGNOSIS — Z79631 Long term (current) use of antimetabolite agent: Secondary | ICD-10-CM | POA: Diagnosis not present

## 2022-09-30 DIAGNOSIS — R030 Elevated blood-pressure reading, without diagnosis of hypertension: Secondary | ICD-10-CM | POA: Diagnosis not present

## 2022-10-01 ENCOUNTER — Encounter: Payer: Medicare HMO | Admitting: Dietician

## 2022-10-02 ENCOUNTER — Encounter: Payer: Medicare HMO | Admitting: Dietician

## 2022-10-03 ENCOUNTER — Other Ambulatory Visit: Payer: Self-pay

## 2022-10-03 ENCOUNTER — Inpatient Hospital Stay (HOSPITAL_BASED_OUTPATIENT_CLINIC_OR_DEPARTMENT_OTHER): Payer: Medicare HMO | Admitting: Hematology & Oncology

## 2022-10-03 ENCOUNTER — Encounter: Payer: Self-pay | Admitting: Hematology & Oncology

## 2022-10-03 ENCOUNTER — Inpatient Hospital Stay: Payer: Medicare HMO | Attending: Hematology & Oncology

## 2022-10-03 ENCOUNTER — Inpatient Hospital Stay: Payer: Medicare HMO

## 2022-10-03 VITALS — BP 140/52 | HR 96 | Temp 99.0°F | Resp 16 | Ht 61.5 in | Wt 114.0 lb

## 2022-10-03 DIAGNOSIS — C91Z0 Other lymphoid leukemia not having achieved remission: Secondary | ICD-10-CM

## 2022-10-03 DIAGNOSIS — N1832 Chronic kidney disease, stage 3b: Secondary | ICD-10-CM

## 2022-10-03 DIAGNOSIS — R110 Nausea: Secondary | ICD-10-CM | POA: Diagnosis not present

## 2022-10-03 DIAGNOSIS — M05729 Rheumatoid arthritis with rheumatoid factor of unspecified elbow without organ or systems involvement: Secondary | ICD-10-CM

## 2022-10-03 LAB — CMP (CANCER CENTER ONLY)
ALT: 45 U/L — ABNORMAL HIGH (ref 0–44)
AST: 70 U/L — ABNORMAL HIGH (ref 15–41)
Albumin: 3.7 g/dL (ref 3.5–5.0)
Alkaline Phosphatase: 67 U/L (ref 38–126)
Anion gap: 10 (ref 5–15)
BUN: 15 mg/dL (ref 8–23)
CO2: 27 mmol/L (ref 22–32)
Calcium: 9.4 mg/dL (ref 8.9–10.3)
Chloride: 103 mmol/L (ref 98–111)
Creatinine: 1.2 mg/dL — ABNORMAL HIGH (ref 0.44–1.00)
GFR, Estimated: 44 mL/min — ABNORMAL LOW (ref >60)
Glucose, Bld: 103 mg/dL — ABNORMAL HIGH (ref 70–99)
Potassium: 4.5 mmol/L (ref 3.5–5.1)
Sodium: 140 mmol/L (ref 135–145)
Total Bilirubin: 0.6 mg/dL (ref 0.3–1.2)
Total Protein: 7.6 g/dL (ref 6.5–8.1)

## 2022-10-03 LAB — CBC WITH DIFFERENTIAL (CANCER CENTER ONLY)
Abs Immature Granulocytes: 0.01 10*3/uL (ref 0.00–0.07)
Basophils Absolute: 0.1 10*3/uL (ref 0.0–0.1)
Basophils Relative: 1 %
Eosinophils Absolute: 0 10*3/uL (ref 0.0–0.5)
Eosinophils Relative: 0 %
HCT: 31.1 % — ABNORMAL LOW (ref 36.0–46.0)
Hemoglobin: 9.4 g/dL — ABNORMAL LOW (ref 12.0–15.0)
Immature Granulocytes: 0 %
Lymphocytes Relative: 74 %
Lymphs Abs: 4.6 10*3/uL — ABNORMAL HIGH (ref 0.7–4.0)
MCH: 24.8 pg — ABNORMAL LOW (ref 26.0–34.0)
MCHC: 30.2 g/dL (ref 30.0–36.0)
MCV: 82.1 fL (ref 80.0–100.0)
Monocytes Absolute: 0.5 10*3/uL (ref 0.1–1.0)
Monocytes Relative: 7 %
Neutro Abs: 1.1 10*3/uL — ABNORMAL LOW (ref 1.7–7.7)
Neutrophils Relative %: 18 %
Platelet Count: 330 10*3/uL (ref 150–400)
RBC: 3.79 MIL/uL — ABNORMAL LOW (ref 3.87–5.11)
RDW: 16.3 % — ABNORMAL HIGH (ref 11.5–15.5)
Smear Review: NORMAL
WBC Count: 6.3 10*3/uL (ref 4.0–10.5)
nRBC: 0 % (ref 0.0–0.2)

## 2022-10-03 LAB — SAVE SMEAR(SSMR), FOR PROVIDER SLIDE REVIEW

## 2022-10-03 LAB — LACTATE DEHYDROGENASE: LDH: 233 U/L — ABNORMAL HIGH (ref 98–192)

## 2022-10-03 MED ORDER — SODIUM CHLORIDE 0.9 % IV SOLN
10.0000 mg | Freq: Once | INTRAVENOUS | Status: AC
  Administered 2022-10-03: 10 mg via INTRAVENOUS
  Filled 2022-10-03: qty 10

## 2022-10-03 MED ORDER — SODIUM CHLORIDE 0.9 % IV SOLN: INTRAVENOUS | Status: AC

## 2022-10-03 MED ORDER — SODIUM CHLORIDE 0.9 % IV SOLN: 40.0000 mg | Freq: Once | INTRAVENOUS | Status: DC

## 2022-10-03 MED ORDER — FAMOTIDINE IN NACL 20-0.9 MG/50ML-% IV SOLN
INTRAVENOUS | Status: AC
  Filled 2022-10-03: qty 100

## 2022-10-03 MED ORDER — FAMOTIDINE IN NACL 20-0.9 MG/50ML-% IV SOLN
20.0000 mg | Freq: Two times a day (BID) | INTRAVENOUS | Status: AC
  Administered 2022-10-03 (×2): 20 mg via INTRAVENOUS

## 2022-10-03 NOTE — Progress Notes (Signed)
Hematology and Oncology Follow Up Visit  [name] [name] [id number] October 30, 1934 88 y.o. 10/03/2022   Principle Diagnosis:  Large granular lymphocytic leukemia Rheumatoid arthritis  Current Therapy:   Methotrexate 20 mg p.o. weekly- start on 07/27/2021     Interim History:  [name] [name] is back for follow-up.  Apparently, she is not doing all that well.  Her son comes in with her.  He said that she apparently was not taking her thyroid medicine for about 3 months.  She had to be hospitalized.  She still is not doing all that great.  Again not sure exactly what is going on with her.  She I think is taking that methotrexate.  Her white cell count is doing pretty well.  At least her [place] is 1.1.  She does seem dehydrated.  She says she is not hungry.  A lot of this her family doctor will have to take care of.  I think they will see him on Thursday.  She has had no fever.  There is been no probably COVID.  She has had no diarrhea.  She has had no rashes.  Patient has chronic leg swelling.  I think she has on some compression stockings.  Currently, I would say that her performance status is probably ECOG 3.  S  Medications:  Current Outpatient Medications:    acetaminophen (TYLENOL) 650 MG CR tablet, Take 650 mg by mouth every 8 (eight) hours as needed for pain., Disp: , Rfl:    JANUVIA 25 MG tablet, TAKE 1 TABLET (25 MG TOTAL) BY MOUTH DAILY. (Patient taking differently: Take 25 mg by mouth daily.), Disp: 30 tablet, Rfl: 2   leflunomide (ARAVA) 10 MG tablet, TAKE 1 TABLET BY MOUTH EVERY DAY, Disp: 90 tablet, Rfl: 0   levothyroxine (SYNTHROID) 75 MCG tablet, Take 1 tablet (75 mcg total) by mouth daily before breakfast., Disp: 30 tablet, Rfl: 2   methotrexate (RHEUMATREX) 10 MG tablet, Take 2 tablets (20 mg total) by mouth once a week., Disp: 8 tablet, Rfl: 3   methotrexate (RHEUMATREX) 2.5 MG tablet, Take 5 mg by mouth. Monday Wednesday friday, Disp: , Rfl:    Multiple Minerals-Vitamins  (CALCIUM CITRATE PLUS/MAGNESIUM) TABS, Take 1 tablet by mouth daily., Disp: , Rfl:    Polyethyl Glycol-Propyl Glycol (SYSTANE OP), Apply to eye as needed., Disp: , Rfl:   Allergies:  Allergies  Allergen Reactions   Tramadol Nausea Only   Codeine Phosphate Nausea Only    REACTION: unspecified    Past Medical History, Surgical history, Social history, and Family History were reviewed and updated.  Review of Systems: Review of Systems  Constitutional:  Positive for fatigue.  HENT:  Negative.    Eyes: Negative.   Respiratory: Negative.    Cardiovascular: Negative.   Gastrointestinal: Negative.   Endocrine: Negative.   Genitourinary: Negative.    Musculoskeletal:  Positive for arthralgias and myalgias.  Skin: Negative.   Neurological: Negative.   Hematological: Negative.   Psychiatric/Behavioral: Negative.      Physical Exam:  height is 5' 1.5" (1.562 m) and weight is 114 lb (51.7 kg). Her oral temperature is 99 F (37.2 C). Her blood pressure is 140/52 (abnormal) and her pulse is 96. Her respiration is 16.   Wt Readings from Last 3 Encounters:  10/03/22 114 lb (51.7 kg)  09/26/22 122 lb 12.7 oz (55.7 kg)  09/24/22 113 lb 3.2 oz (51.3 kg)    Physical Exam Vitals reviewed.  HENT:     Head:  Normocephalic and atraumatic.  Eyes:     Pupils: Pupils are equal, round, and reactive to light.  Cardiovascular:     Rate and Rhythm: Normal rate and regular rhythm.     Heart sounds: Normal heart sounds.  Pulmonary:     Effort: Pulmonary effort is normal.     Breath sounds: Normal breath sounds.  Abdominal:     General: Bowel sounds are normal.     Palpations: Abdomen is soft.  Musculoskeletal:        General: No tenderness or deformity. Normal range of motion.     Cervical back: Normal range of motion.  Lymphadenopathy:     Cervical: No cervical adenopathy.  Skin:    General: Skin is warm and dry.     Findings: No erythema or rash.  Neurological:     Mental Status: She  is alert and oriented to person, place, and time.  Psychiatric:        Behavior: Behavior normal.        Thought Content: Thought content normal.        Judgment: Judgment normal.     Lab Results  Component Value Date   WBC 6.3 10/03/2022   HGB 9.4 (L) 10/03/2022   HCT 31.1 (L) 10/03/2022   MCV 82.1 10/03/2022   PLT 330 10/03/2022     Chemistry      Component Value Date/Time   NA 140 10/03/2022 1226   NA 141 02/03/2019 1129   K 4.5 10/03/2022 1226   CL 103 10/03/2022 1226   CO2 27 10/03/2022 1226   BUN 15 10/03/2022 1226   BUN 31 (H) 02/03/2019 1129   CREATININE 1.20 (H) 10/03/2022 1226   CREATININE 1.00 (H) 05/20/2021 1545      Component Value Date/Time   CALCIUM 9.4 10/03/2022 1226   ALKPHOS 67 10/03/2022 1226   AST 70 (H) 10/03/2022 1226   ALT 45 (H) 10/03/2022 1226   BILITOT 0.6 10/03/2022 1226      Impression and Plan: [name] [name] is a very charming 88 year old white female.  She has rheumatoid arthritis.  We have her on methotrexate.  The problem right now is her lack of interest of eating.  I do not think this would be from the methotrexate.  However, I cannot discount this possibility.  However, I think we really need to keep her on the methotrexate for right now.  I will give her some IV fluids today.  Will get have to follow her closely.  I am sure we will probably have to get her back in a couple weeks or so to see how she is doing.  Hopefully, she is feeling better.    Volanda Napoleon, MD 3/1/20241:19 PM

## 2022-10-03 NOTE — Patient Instructions (Signed)
Dehydration, Adult Dehydration is a condition in which there is not enough water or other fluids in the body. This happens when a person loses more fluids than they take in. Important organs cannot work right without the right amount of fluids. Any loss of fluids from the body can cause dehydration. Dehydration can be mild, worse, or very bad. It should be treated right away to keep it from getting very bad. What are the causes? Conditions that cause loss of water in the body. They include: Watery poop (diarrhea). Vomiting. Sweating a lot. Fever. Infection. Peeing (urinating) a lot. Not drinking enough fluids. Certain medicines, such as medicines that take extra fluid out of the body (diuretics). Lack of safe drinking water. Not being able to get enough water and food. What increases the risk? Having a long-term (chronic) illness that has not been treated the right way, such as: Diabetes. Heart disease. Kidney disease. Being 32 years of age or older. Having a disability. Living in a place that is high above the ground or sea (high in altitude). The thinner, drier air causes more fluid loss. Doing exercises that put stress on your body for a long time. Being active when in hot places. What are the signs or symptoms? Symptoms of dehydration depend on how bad it is. Mild or worse dehydration Thirst. Dry lips or dry mouth. Feeling dizzy or light-headed. Muscle cramps. Passing little pee or dark pee. Pee may be the color of tea. Headache. Very bad dehydration Changes in skin. Skin may: Be cold to the touch (clammy). Be blotchy or pale. Not go back to normal right after you pinch it and let it go. Little or no tears, pee, or sweat. Fast breathing. Low blood pressure. Weak pulse. Pulse that is more than 100 beats a minute when you are sitting still. Other changes, such as: Feeling very thirsty. Eyes that look hollow (sunken). Cold hands and feet. Being confused. Being very  tired (lethargic) or having trouble waking from sleep. Losing weight. Loss of consciousness. How is this treated? Treatment for this condition depends on how bad your dehydration is. Treatment should start right away. Do not wait until your condition gets very bad. Very bad dehydration is an emergency. You will need to go to a hospital. Mild or worse dehydration can be treated at home. You may be asked to: Drink more fluids. Drink an oral rehydration solution (ORS). This drink gives you the right amount of fluids, salts, and minerals (electrolytes). Very bad dehydration can be treated: With fluids through an IV tube. By correcting low levels of electrolytes in the body. By treating the problem that caused your dehydration. Follow these instructions at home: Oral rehydration solution If told by your doctor, drink an ORS: Make an ORS. Use instructions on the package. Start by drinking small amounts, about  cup (120 mL) every 5-10 minutes. Slowly drink more until you have had the amount that your doctor said to have.  Eating and drinking  Drink enough clear fluid to keep your pee pale yellow. If you were told to drink an ORS, finish the ORS first. Then, start slowly drinking other clear fluids. Drink fluids such as: Water. Do not drink only water. Doing that can make the salt (sodium) level in your body get too low. Water from ice chips you suck on. Fruit juice that you have added water to (diluted). Low-calorie sports drinks. Eat foods that have the right amounts of salts and minerals, such as bananas, oranges, potatoes,  tomatoes, or spinach. Do not drink alcohol. Avoid drinks that have caffeine or sugar. These include:: High-calorie sports drinks. Fruit juice that you did not add water to. Soda. Coffee or energy drinks. Avoid foods that are greasy or have a lot of fat or sugar. General instructions Take over-the-counter and prescription medicines only as told by your doctor. Do  not take sodium tablets. Doing that can make the salt level in your body get too high. Return to your normal activities as told by your doctor. Ask your doctor what activities are safe for you. Keep all follow-up visits. Your doctor may check and change your treatment. Contact a doctor if: You have pain in your belly (abdomen) and the pain: Gets worse. Stays in one place. You have a rash. You have a stiff neck. You get angry or annoyed more easily than normal. You are more tired or have a harder time waking than normal. You feel weak or dizzy. You feel very thirsty. Get help right away if: You have any symptoms of very bad dehydration. You vomit every time you eat or drink. Your vomiting gets worse, does not go away, or you vomit blood or green stuff. You are getting treatment, but symptoms are getting worse. You have a fever. You have a very bad headache. You have: Diarrhea that gets worse or does not go away. Blood in your poop (stool). This may cause poop to look black and tarry. No pee in 6-8 hours. Only a small amount of pee in 6-8 hours, and the pee is very dark. You have trouble breathing. These symptoms may be an emergency. Get help right away. Call 911. Do not wait to see if the symptoms will go away. Do not drive yourself to the hospital. This information is not intended to replace advice given to you by your health care provider. Make sure you discuss any questions you have with your health care provider. Document Revised: 02/17/2022 Document Reviewed: 02/17/2022 Elsevier Patient Education  [place].

## 2022-10-03 NOTE — Progress Notes (Signed)
Okay to change dexamethasone dose from 12 mg to 10 mg per Dr. Marin Olp.

## 2022-10-08 ENCOUNTER — Ambulatory Visit: Payer: Medicare HMO | Admitting: Dietician

## 2022-10-08 ENCOUNTER — Telehealth: Payer: Self-pay | Admitting: Dietician

## 2022-10-08 NOTE — Telephone Encounter (Signed)
Attempted to reach patient to follow up on IP RD of severe malnutrition diagnosis and recent weight loss. Left message to return call on voice mail.  April Manson, RDN, LDN Registered Dietitian, [place] Part Time Remote (Usual office hours: Tuesday-Thursday) Cell: [phone]

## 2022-10-08 NOTE — Progress Notes (Signed)
Nutrition Follow Up:  Patient's son [name] [name] returned message to follow up on IP RD severe malnutrition dx.   He reports her appetite has improved greatly since being discharged from the hospital.  She is back to eating 2 good meals a day and still using Ensure ONS QD (think Enlive as he described it).  She cooks for herself and enjoys fresh foods she doesn't like frozen prepared items other than pizza.  He goes grocery shopping for her and buys what she needs and has been monitoring how much she's eating by what is left.   Pt was not taking her synthroid but has resumes that and he thinks since that medication has resumed her mental clarity is also better.  She has a brother that lives very close as well who will eat with her often. He is also planning to install some cameras so he can keep a better eye on how she is doing.  NUTRITION DIAGNOSIS:    Severe Malnutrition related to chronic illness (leukemia) as evidenced by moderate fat depletion, severe muscle depletion, energy intake < or equal to 75% for > or equal to 1 month.   GOAL:    Patient will meet greater than or equal to 90% of their needs   MONITOR:    PO intake, Labs, Weight trends,  Intervention:  Encouraged nutrient dense snack items to have for her.  Encouraged frequent visits and checks on food supplies and that she's eating what he buys.    He has contact information, decline any resources or tips sheets.  Next Visit: Remote follow up next month after MD follow up   [name] [name], RDN, LDN Registered Dietitian, [place] (Usual office hours: Tuesday-Thursday) Mobile: [phone]

## 2022-10-09 ENCOUNTER — Ambulatory Visit (INDEPENDENT_AMBULATORY_CARE_PROVIDER_SITE_OTHER): Payer: Medicare HMO | Admitting: Family Medicine

## 2022-10-09 ENCOUNTER — Encounter: Payer: Self-pay | Admitting: Family Medicine

## 2022-10-09 VITALS — BP 150/60 | HR 84 | Temp 98.0°F | Ht 61.5 in | Wt 112.8 lb

## 2022-10-09 DIAGNOSIS — R7303 Prediabetes: Secondary | ICD-10-CM

## 2022-10-09 DIAGNOSIS — N1832 Chronic kidney disease, stage 3b: Secondary | ICD-10-CM

## 2022-10-09 DIAGNOSIS — E039 Hypothyroidism, unspecified: Secondary | ICD-10-CM

## 2022-10-09 DIAGNOSIS — I10 Essential (primary) hypertension: Secondary | ICD-10-CM

## 2022-10-09 MED ORDER — EMPAGLIFLOZIN 10 MG PO TABS: 10.0000 mg | ORAL_TABLET | Freq: Every day | ORAL | 2 refills | Status: DC

## 2022-10-09 NOTE — Progress Notes (Signed)
Established Patient Office Visit   Subjective:  Patient ID: [name] [name], female    DOB: 1935-06-17  Age: 88 y.o. MRN: [id number]  Chief Complaint  Patient presents with   Medical Management of Chronic Issues    1 week f/u weight loss and memory.   ? Benazepril stopped in the hospital.      HPI Encounter Diagnoses  Name Primary?   Prediabetes Yes   Chronic kidney disease (CKD) stage G3b    Acquired hypothyroidism    Essential hypertension    For follow-up status post recent hospitalization for AKI.  After fluid resuscitation her renal status improved.  Apparently she had discontinued her levothyroxine.  Her original dose of 75 mcg was restarted in the hospital.  She has been on it for the last 2 weeks without issue.  She is accompanied by her son today.  Hospitalist discontinued her Lotensin 5 mg daily.  She continues to take Januvia.      Review of Systems  Constitutional: Negative.   HENT: Negative.    Eyes:  Negative for blurred vision, discharge and redness.  Respiratory: Negative.    Cardiovascular: Negative.   Gastrointestinal:  Negative for abdominal pain.  Genitourinary: Negative.   Musculoskeletal: Negative.  Negative for myalgias.  Skin:  Negative for rash.  Neurological:  Negative for tingling, loss of consciousness and weakness.  Endo/Heme/Allergies:  Negative for polydipsia.     Current Outpatient Medications:    acetaminophen (TYLENOL) 650 MG CR tablet, Take 650 mg by mouth every 8 (eight) hours as needed for pain., Disp: , Rfl:    empagliflozin (JARDIANCE) 10 MG TABS tablet, Take 1 tablet (10 mg total) by mouth daily before breakfast., Disp: 30 tablet, Rfl: 2   leflunomide (ARAVA) 10 MG tablet, TAKE 1 TABLET BY MOUTH EVERY DAY, Disp: 90 tablet, Rfl: 0   levothyroxine (SYNTHROID) 75 MCG tablet, Take 1 tablet (75 mcg total) by mouth daily before breakfast., Disp: 30 tablet, Rfl: 2   methotrexate (RHEUMATREX) 2.5 MG tablet, Take 5 mg by mouth. Monday  Wednesday friday, Disp: , Rfl:    Multiple Minerals-Vitamins (CALCIUM CITRATE PLUS/MAGNESIUM) TABS, Take 1 tablet by mouth daily., Disp: , Rfl:    Polyethyl Glycol-Propyl Glycol (SYSTANE OP), Apply to eye as needed., Disp: , Rfl:    methotrexate (RHEUMATREX) 10 MG tablet, Take 2 tablets (20 mg total) by mouth once a week. (Patient not taking: Reported on 10/09/2022), Disp: 8 tablet, Rfl: 3   Objective:     BP (!) 150/60   Pulse 84   Temp 98 F (36.7 C) (Temporal)   Ht 5' 1.5" (1.562 m)   Wt 112 lb 12.8 oz (51.2 kg)   SpO2 98%   BMI 20.97 kg/m  BP Readings from Last 3 Encounters:  10/09/22 (!) 150/60  10/03/22 (!) 135/50  10/03/22 (!) 140/52   Wt Readings from Last 3 Encounters:  10/09/22 112 lb 12.8 oz (51.2 kg)  10/03/22 114 lb (51.7 kg)  09/26/22 122 lb 12.7 oz (55.7 kg)      Physical Exam Constitutional:      General: She is not in acute distress.    Appearance: Normal appearance. She is not ill-appearing, toxic-appearing or diaphoretic.  HENT:     Head: Normocephalic and atraumatic.     Right Ear: External ear normal.     Left Ear: External ear normal.  Eyes:     General: No scleral icterus.       Right eye: No discharge.  Left eye: No discharge.     Extraocular Movements: Extraocular movements intact.     Conjunctiva/sclera: Conjunctivae normal.  Cardiovascular:     Rate and Rhythm: Normal rate and regular rhythm.  Pulmonary:     Effort: Pulmonary effort is normal. No respiratory distress.     Breath sounds: Normal breath sounds.  Skin:    General: Skin is warm and dry.  Neurological:     Mental Status: She is alert and oriented to person, place, and time.  Psychiatric:        Mood and Affect: Mood normal.        Behavior: Behavior normal.      No results found for any visits on 10/09/22.    The ASCVD Risk score (Arnett DK, et al., 2019) failed to calculate for the following reasons:   The 2019 ASCVD risk score is only valid for ages 70 to  57    Assessment & Plan:   Prediabetes -     Empagliflozin; Take 1 tablet (10 mg total) by mouth daily before breakfast.  Dispense: 30 tablet; Refill: 2 -     Basic metabolic panel -     Hemoglobin A1c  Chronic kidney disease (CKD) stage G3b -     Empagliflozin; Take 1 tablet (10 mg total) by mouth daily before breakfast.  Dispense: 30 tablet; Refill: 2 -     Basic metabolic panel  Acquired hypothyroidism  Essential hypertension -     Basic metabolic panel    Return in about 4 weeks (around 11/06/2022), or use glucerna for nutritional supplementation.Marland Kitchen  Restart Lotensin '5mg'$  daily.  Stop Januvia.  Will start Jardiance.  Continue with levothyroxine 75.  She has been on it for a couple of weeks without issue.  There has been some cognitive decline since she discontinued the levothyroxine.  Hopefully she will recover her mentation.  Libby Maw, MD

## 2022-10-10 LAB — BASIC METABOLIC PANEL
BUN: 17 mg/dL (ref 6–23)
CO2: 27 mEq/L (ref 19–32)
Calcium: 9.6 mg/dL (ref 8.4–10.5)
Chloride: 104 mEq/L (ref 96–112)
Creatinine, Ser: 1.04 mg/dL (ref 0.40–1.20)
GFR: 48.91 mL/min — ABNORMAL LOW (ref >60.00)
Glucose, Bld: 88 mg/dL (ref 70–99)
Potassium: 5 mEq/L (ref 3.5–5.1)
Sodium: 140 mEq/L (ref 135–145)

## 2022-10-10 LAB — HEMOGLOBIN A1C: Hgb A1c MFr Bld: 6.3 % (ref 4.6–6.5)

## 2022-10-22 ENCOUNTER — Ambulatory Visit: Payer: Medicare HMO | Admitting: Family Medicine

## 2022-10-27 ENCOUNTER — Telehealth: Payer: Self-pay | Admitting: Family Medicine

## 2022-10-27 NOTE — Telephone Encounter (Signed)
Caller Name: Todd/son Call back phone #: [phone]  Reason for Call: [name] [name] came back from out of town and his mom [name] [name] has sores in her mouth.This has been about 2 weeks. They are thinking it may be a reaction to Jardiance. Does she need to come in or can you just prescribe something else?

## 2022-10-27 NOTE — Telephone Encounter (Signed)
Appointment scheduled for evaluation.

## 2022-10-28 ENCOUNTER — Emergency Department (HOSPITAL_COMMUNITY): Payer: Medicare HMO

## 2022-10-28 ENCOUNTER — Inpatient Hospital Stay (HOSPITAL_COMMUNITY)
Admission: EM | Admit: 2022-10-28 | Discharge: 2022-11-07 | DRG: 682 | Disposition: A | Discharge status: Home Health | Payer: Medicare HMO | Attending: Internal Medicine | Admitting: Internal Medicine

## 2022-10-28 ENCOUNTER — Ambulatory Visit: Payer: Medicare HMO | Admitting: Family Medicine

## 2022-10-28 ENCOUNTER — Other Ambulatory Visit: Payer: Self-pay

## 2022-10-28 DIAGNOSIS — Z8249 Family history of ischemic heart disease and other diseases of the circulatory system: Secondary | ICD-10-CM

## 2022-10-28 DIAGNOSIS — K121 Other forms of stomatitis: Secondary | ICD-10-CM

## 2022-10-28 DIAGNOSIS — N179 Acute kidney failure, unspecified: Secondary | ICD-10-CM | POA: Diagnosis not present

## 2022-10-28 DIAGNOSIS — C91Z0 Other lymphoid leukemia not having achieved remission: Secondary | ICD-10-CM | POA: Diagnosis present

## 2022-10-28 DIAGNOSIS — Z7984 Long term (current) use of oral hypoglycemic drugs: Secondary | ICD-10-CM

## 2022-10-28 DIAGNOSIS — Z833 Family history of diabetes mellitus: Secondary | ICD-10-CM

## 2022-10-28 DIAGNOSIS — D509 Iron deficiency anemia, unspecified: Secondary | ICD-10-CM | POA: Diagnosis not present

## 2022-10-28 DIAGNOSIS — E039 Hypothyroidism, unspecified: Secondary | ICD-10-CM | POA: Diagnosis present

## 2022-10-28 DIAGNOSIS — D61810 Antineoplastic chemotherapy induced pancytopenia: Secondary | ICD-10-CM | POA: Diagnosis present

## 2022-10-28 DIAGNOSIS — M05721 Rheumatoid arthritis with rheumatoid factor of right elbow without organ or systems involvement: Secondary | ICD-10-CM

## 2022-10-28 DIAGNOSIS — D61818 Other pancytopenia: Secondary | ICD-10-CM | POA: Diagnosis not present

## 2022-10-28 DIAGNOSIS — T451X5A Adverse effect of antineoplastic and immunosuppressive drugs, initial encounter: Secondary | ICD-10-CM | POA: Diagnosis present

## 2022-10-28 DIAGNOSIS — R509 Fever, unspecified: Secondary | ICD-10-CM

## 2022-10-28 DIAGNOSIS — M0579 Rheumatoid arthritis with rheumatoid factor of multiple sites without organ or systems involvement: Secondary | ICD-10-CM | POA: Diagnosis not present

## 2022-10-28 DIAGNOSIS — I10 Essential (primary) hypertension: Secondary | ICD-10-CM | POA: Diagnosis present

## 2022-10-28 DIAGNOSIS — K1231 Oral mucositis (ulcerative) due to antineoplastic therapy: Secondary | ICD-10-CM | POA: Diagnosis present

## 2022-10-28 DIAGNOSIS — D701 Agranulocytosis secondary to cancer chemotherapy: Secondary | ICD-10-CM

## 2022-10-28 DIAGNOSIS — R7303 Prediabetes: Secondary | ICD-10-CM

## 2022-10-28 DIAGNOSIS — E785 Hyperlipidemia, unspecified: Secondary | ICD-10-CM | POA: Diagnosis present

## 2022-10-28 DIAGNOSIS — N1831 Chronic kidney disease, stage 3a: Secondary | ICD-10-CM

## 2022-10-28 DIAGNOSIS — N178 Other acute kidney failure: Secondary | ICD-10-CM

## 2022-10-28 DIAGNOSIS — Z66 Do not resuscitate: Secondary | ICD-10-CM | POA: Diagnosis present

## 2022-10-28 DIAGNOSIS — D649 Anemia, unspecified: Secondary | ICD-10-CM | POA: Diagnosis not present

## 2022-10-28 DIAGNOSIS — Z79890 Hormone replacement therapy (postmenopausal): Secondary | ICD-10-CM

## 2022-10-28 DIAGNOSIS — R5081 Fever presenting with conditions classified elsewhere: Secondary | ICD-10-CM | POA: Diagnosis present

## 2022-10-28 DIAGNOSIS — R531 Weakness: Secondary | ICD-10-CM | POA: Diagnosis not present

## 2022-10-28 DIAGNOSIS — E860 Dehydration: Secondary | ICD-10-CM | POA: Diagnosis not present

## 2022-10-28 DIAGNOSIS — K120 Recurrent oral aphthae: Secondary | ICD-10-CM

## 2022-10-28 DIAGNOSIS — D709 Neutropenia, unspecified: Secondary | ICD-10-CM | POA: Diagnosis present

## 2022-10-28 DIAGNOSIS — Z79631 Long term (current) use of antimetabolite agent: Secondary | ICD-10-CM

## 2022-10-28 DIAGNOSIS — M069 Rheumatoid arthritis, unspecified: Secondary | ICD-10-CM | POA: Diagnosis present

## 2022-10-28 DIAGNOSIS — N1832 Chronic kidney disease, stage 3b: Secondary | ICD-10-CM | POA: Diagnosis present

## 2022-10-28 DIAGNOSIS — I129 Hypertensive chronic kidney disease with stage 1 through stage 4 chronic kidney disease, or unspecified chronic kidney disease: Secondary | ICD-10-CM | POA: Diagnosis present

## 2022-10-28 DIAGNOSIS — Z90710 Acquired absence of both cervix and uterus: Secondary | ICD-10-CM

## 2022-10-28 LAB — CBC WITH DIFFERENTIAL/PLATELET
Abs Immature Granulocytes: 0.01 10*3/uL (ref 0.00–0.07)
Basophils Absolute: 0 10*3/uL (ref 0.0–0.1)
Basophils Relative: 0 %
Eosinophils Absolute: 0 10*3/uL (ref 0.0–0.5)
Eosinophils Relative: 1 %
HCT: 28.1 % — ABNORMAL LOW (ref 36.0–46.0)
Hemoglobin: 8.8 g/dL — ABNORMAL LOW (ref 12.0–15.0)
Immature Granulocytes: 0 %
Lymphocytes Relative: 84 %
Lymphs Abs: 2.1 10*3/uL (ref 0.7–4.0)
MCH: 26 pg (ref 26.0–34.0)
MCHC: 31.3 g/dL (ref 30.0–36.0)
MCV: 83.1 fL (ref 80.0–100.0)
Monocytes Absolute: 0.2 10*3/uL (ref 0.1–1.0)
Monocytes Relative: 10 %
Neutro Abs: 0.1 10*3/uL — CL (ref 1.7–7.7)
Neutrophils Relative %: 5 %
Platelets: 86 10*3/uL — ABNORMAL LOW (ref 150–400)
RBC: 3.38 MIL/uL — ABNORMAL LOW (ref 3.87–5.11)
RDW: 20.7 % — ABNORMAL HIGH (ref 11.5–15.5)
WBC: 2.3 10*3/uL — ABNORMAL LOW (ref 4.0–10.5)
nRBC: 0 % (ref 0.0–0.2)

## 2022-10-28 LAB — COMPREHENSIVE METABOLIC PANEL
ALT: 26 U/L (ref 0–44)
AST: 28 U/L (ref 15–41)
Albumin: 3.6 g/dL (ref 3.5–5.0)
Alkaline Phosphatase: 61 U/L (ref 38–126)
Anion gap: 14 (ref 5–15)
BUN: 53 mg/dL — ABNORMAL HIGH (ref 8–23)
CO2: 22 mmol/L (ref 22–32)
Calcium: 8.8 mg/dL — ABNORMAL LOW (ref 8.9–10.3)
Chloride: 101 mmol/L (ref 98–111)
Creatinine, Ser: 1.81 mg/dL — ABNORMAL HIGH (ref 0.44–1.00)
GFR, Estimated: 27 mL/min — ABNORMAL LOW (ref >60)
Glucose, Bld: 123 mg/dL — ABNORMAL HIGH (ref 70–99)
Potassium: 3.8 mmol/L (ref 3.5–5.1)
Sodium: 137 mmol/L (ref 135–145)
Total Bilirubin: 0.6 mg/dL (ref 0.3–1.2)
Total Protein: 7.5 g/dL (ref 6.5–8.1)

## 2022-10-28 LAB — TROPONIN I (HIGH SENSITIVITY)
Troponin I (High Sensitivity): 35 ng/L — ABNORMAL HIGH (ref <18)
Troponin I (High Sensitivity): 33 ng/L — ABNORMAL HIGH (ref <18)

## 2022-10-28 LAB — T4, FREE: Free T4: 1.48 ng/dL — ABNORMAL HIGH (ref 0.61–1.12)

## 2022-10-28 LAB — TSH: TSH: 6.488 u[IU]/mL — ABNORMAL HIGH (ref 0.350–4.500)

## 2022-10-28 MED ORDER — MAGIC MOUTHWASH W/LIDOCAINE
5.0000 mL | Freq: Three times a day (TID) | ORAL | Status: DC
  Administered 2022-10-28 – 2022-11-02 (×15): 5 mL via ORAL
  Filled 2022-10-28 (×18): qty 5

## 2022-10-28 MED ORDER — FOLIC ACID 1 MG PO TABS
1.0000 mg | ORAL_TABLET | Freq: Every day | ORAL | Status: DC
  Administered 2022-10-28 – 2022-11-07 (×11): 1 mg via ORAL
  Filled 2022-10-28 (×11): qty 1

## 2022-10-28 MED ORDER — LEVOTHYROXINE SODIUM 75 MCG PO TABS
75.0000 ug | ORAL_TABLET | Freq: Every day | ORAL | Status: DC
  Administered 2022-10-29 – 2022-11-07 (×9): 75 ug via ORAL
  Filled 2022-10-28 (×9): qty 1

## 2022-10-28 MED ORDER — ACETAMINOPHEN 325 MG PO TABS
650.0000 mg | ORAL_TABLET | Freq: Four times a day (QID) | ORAL | Status: DC | PRN
  Administered 2022-10-29 – 2022-11-06 (×16): 650 mg via ORAL
  Filled 2022-10-28 (×17): qty 2

## 2022-10-28 MED ORDER — LIDOCAINE VISCOUS HCL 2 % MT SOLN
15.0000 mL | Freq: Once | OROMUCOSAL | Status: AC
  Administered 2022-10-28: 15 mL via OROMUCOSAL
  Filled 2022-10-28: qty 15

## 2022-10-28 MED ORDER — ENSURE ENLIVE PO LIQD
237.0000 mL | Freq: Two times a day (BID) | ORAL | Status: DC
  Administered 2022-10-29 – 2022-11-07 (×17): 237 mL via ORAL

## 2022-10-28 MED ORDER — LACTATED RINGERS IV BOLUS
1000.0000 mL | Freq: Once | INTRAVENOUS | Status: AC
  Administered 2022-10-28: 1000 mL via INTRAVENOUS

## 2022-10-28 MED ORDER — LACTATED RINGERS IV SOLN: INTRAVENOUS | Status: AC

## 2022-10-28 NOTE — H&P (Addendum)
History and Physical    PatientTALEIGH [name] [id number] DOB: 1935-04-03 DOA: 10/28/2022 DOS: the patient was seen and examined on 10/28/2022 PCP: Libby Maw, MD  Patient coming from: Home  Chief Complaint:  Chief Complaint  Patient presents with   Weakness   HPI: [name] [name] is a 88 y.o. 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   Developed an ulcer to her right cheek about 2 weeks ago. Has low appetite at baseline but now worse due to pain with eating. No sore throat or dysphasia. Primary care also changed her from Januvia to [place] about a week ago and son started to notice more decline. No nausea, vomiting or diarrhea. No fever.   In the ED, she was afebrile, normotensive on room.   Leukopenia of 2.3, anemia with Hgb of 8.8 around baseline of 8-9. Thrombocytopenia of 86.Absolute neutrophil of 0.1.   Na of 137,K of 3.8, Creatinine elevated to 1.81 from 1.04.   Calcium at 8.8 with normal albumin.  Review of Systems: As mentioned in the history of present illness. All other systems reviewed and are negative. Past Medical History:  Diagnosis Date   Goals of care, counseling/discussion 08/06/2021   HYPERLIPIDEMIA 01/19/2007   HYPERTENSION 01/19/2007   HYPOTHYROIDISM 01/19/2007   Large granular lymphocytic leukemia ([place]) 08/06/2021   Rheumatoid arthritis(714.0) 01/19/2007   Past Surgical History:  Procedure Laterality Date   ABDOMINAL HYSTERECTOMY     APPENDECTOMY     CHOLECYSTECTOMY     NECK SURGERY     Social History:  reports that she has never smoked. She has never been exposed to tobacco smoke. She has never used smokeless tobacco. She reports that she does not drink alcohol and does not use drugs.  Allergies  Allergen Reactions   Tramadol  Nausea Only   Codeine Phosphate Nausea Only    REACTION: unspecified    Family History  Problem Relation Age of Onset   Diabetes Father    Heart disease Father    Cancer Sister        breast    Heart disease Sister    Heart attack Brother    Heart attack Brother    Heart attack Brother     Prior to Admission medications   Medication Sig Start Date End Date Taking? Authorizing Provider  acetaminophen (TYLENOL) 650 MG CR tablet Take 650 mg by mouth every 8 (eight) hours as needed for pain.    [provider]  empagliflozin (JARDIANCE) 10 MG TABS tablet Take 1 tablet (10 mg total) by mouth daily before breakfast. 10/09/22   Libby Maw, MD  leflunomide (ARAVA) 10 MG tablet TAKE 1 TABLET BY MOUTH EVERY DAY 08/13/22   Ofilia Neas, PA-C  levothyroxine (SYNTHROID) 75 MCG tablet Take 1 tablet (75 mcg total) by mouth daily before breakfast. 09/26/22   Dessa Phi, DO  methotrexate (RHEUMATREX) 10 MG tablet Take 2 tablets (20 mg total) by mouth once a week. Patient not taking: Reported on 10/09/2022 04/29/22   Volanda Napoleon, MD  methotrexate (RHEUMATREX) 2.5 MG tablet Take 5 mg by mouth. Monday Wednesday friday 08/04/22   [provider]  Multiple Minerals-Vitamins (CALCIUM CITRATE PLUS/MAGNESIUM) TABS Take 1 tablet by mouth daily.    [provider]  Polyethyl Glycol-Propyl Glycol (SYSTANE OP) Apply to eye as needed.  [provider]    Physical Exam: Vitals:   10/28/22 1545 10/28/22 1900 10/28/22 1915 10/28/22 2147  BP: (!) 112/56 (!) 126/54 (!) 129/56 (!) 123/50  Pulse: 94 93 94 (!) 104  Resp: 13 19 (!) 22 20  Temp:      TempSrc:      SpO2: 100% 97% 99% 98%   Constitutional: NAD, calm, comfortable, non-toxic thin elderly female laying in bed Eyes: lids and conjunctivae normal ENMT: Mucous membranes are moist. Single white plaque ulcer to right lateral mucosal with pain to palpation.   Neck: normal, supple Respiratory: clear to  auscultation bilaterally, no wheezing, no crackles. Normal respiratory effort. No accessory muscle use.  Cardiovascular: Regular rate and rhythm, no murmurs / rubs / gallops. No extremity edema.  Abdomen: no tenderness, soft, non-distended, Bowel sounds positive.  Musculoskeletal: no clubbing / cyanosis. No joint deformity upper and lower extremities. Good ROM, no contractures. Normal muscle tone.  Skin: no rashes, lesions, ulcers. No induration Neurologic: CN 2-12 grossly intact.  Psychiatric: Normal judgment and insight. Alert and oriented x 3. Normal mood. Data Reviewed:  See HPI  Assessment and Plan: * Acute renal failure superimposed on stage 3a chronic kidney disease (HCC) Creatinine elevated to 1.81 from 1.04.  -keep on continuous IV fluid  -hold nephrotoxic agents  Pre-diabetes -hold Jardiance with AKI -no hyperglycemia so will hold sliding scale insulin  Stomatitis -likely secondary to methotrexate toxicity in the setting of AKI -Magic mouthwash TID -start folic acid supplement -Tylenol PRN  Pancytopenia (HCC) -Has hx of iron deficiency anemia at baseline but has new leukopenia and thrombocytopenia -could be secondary to myelosuppression with methotrexate in the setting of AKI -EDP discussed with Dr. Benay Spice on-call oncologist who recommends holding methotrexate and will consult in the morning  Large granular lymphocytic leukemia ([place]) -follows with Dr. Marin Olp -on methotrexate which we will hold due to myelosuppression and stomatitis -oncology consulted and will follow along in the morning  Neutropenia (HCC) -Abs Neutrophil of 0.1 on presentation. Has been an issue in recent past as well -However no fever. Will continue to follow.  Rheumatoid arthritis (HCC) -hold Leflunomide for now  Essential hypertension Holding home ACE-I in the setting of AKI  Hypothyroidism -continue levothyroxine -repeat TSH ordered in ED is pending      Advance Care Planning:  DNR- son reports she has documentation at home  Consults: Oncology  Family Communication: son at bedside  Severity of Illness: The appropriate patient status for this patient is OBSERVATION. Observation status is judged to be reasonable and necessary in order to provide the required intensity of service to ensure the patient's safety. The patient's presenting symptoms, physical exam findings, and initial radiographic and laboratory data in the context of their medical condition is felt to place them at decreased risk for further clinical deterioration. Furthermore, it is anticipated that the patient will be medically stable for discharge from the hospital within 2 midnights of admission.   Author: Orene Desanctis, DO 10/28/2022 9:48 PM  For on call review [email or web address].

## 2022-10-28 NOTE — Assessment & Plan Note (Signed)
-  likely secondary to methotrexate toxicity in the setting of AKI -Magic mouthwash TID -start folic acid supplement -Tylenol PRN

## 2022-10-28 NOTE — ED Triage Notes (Signed)
C/o ulcers in mouth x2 weeks, son reports started about 5 days after med change from [place] to [place]. Also c/o weakness and sob.

## 2022-10-28 NOTE — Assessment & Plan Note (Addendum)
-  follows with Dr. Marin Olp -on methotrexate which we will hold due to myelosuppression and stomatitis -oncology consulted and will follow along in the morning

## 2022-10-28 NOTE — ED Provider Notes (Signed)
[place] EMERGENCY DEPARTMENT AT [facility] Provider Note   CSN: [id number] Arrival date & time: 10/28/22  1441     History  Chief Complaint  Patient presents with   Weakness    [name] [name] is a 88 y.o. female.  87 year old female with a history of hypothyroidism on levothyroxine, hypertension, hyperlipidemia, rheumatoid arthritis on methotrexate, and large granular lymphocytic leukemia who presents to the emergency department with generalized weakness.  Patient accompanied by her son who provides majority of the history.  They state that the patient recently had her medications changed and approximately 10 days ago started experiencing a sore on the right side of her mouth.  Reports that due to pain she has had decreased p.o. intake and that started becoming very weak and dizzy.  Son also feels that her thinking is slowed as well.  No fevers, runny nose, sore throat or cough.  No dysuria or frequency.  No nausea or vomiting or diarrhea.  Is concerned that she is dehydrated.  Lives alone by herself.     Home Medications Prior to Admission medications   Medication Sig Start Date End Date Taking? Authorizing Provider  acetaminophen (TYLENOL) 650 MG CR tablet Take 650 mg by mouth every 8 (eight) hours as needed for pain.    [provider]  empagliflozin (JARDIANCE) 10 MG TABS tablet Take 1 tablet (10 mg total) by mouth daily before breakfast. 10/09/22   Libby Maw, MD  leflunomide (ARAVA) 10 MG tablet TAKE 1 TABLET BY MOUTH EVERY DAY 08/13/22   Ofilia Neas, PA-C  levothyroxine (SYNTHROID) 75 MCG tablet Take 1 tablet (75 mcg total) by mouth daily before breakfast. 09/26/22   Dessa Phi, DO  methotrexate (RHEUMATREX) 10 MG tablet Take 2 tablets (20 mg total) by mouth once a week. Patient not taking: Reported on 10/09/2022 04/29/22   Volanda Napoleon, MD  methotrexate (RHEUMATREX) 2.5 MG tablet Take 5 mg by mouth. Monday Wednesday friday 08/04/22    [provider]  Multiple Minerals-Vitamins (CALCIUM CITRATE PLUS/MAGNESIUM) TABS Take 1 tablet by mouth daily.    [provider]  Polyethyl Glycol-Propyl Glycol (SYSTANE OP) Apply to eye as needed.    [provider]      Allergies    Tramadol and Codeine phosphate    Review of Systems   Review of Systems  Physical Exam Updated Vital Signs BP (!) 123/50 (BP Location: Left Arm)   Pulse (!) 104   Temp 98.5 F (36.9 C) (Oral)   Resp 20   Ht 5\' 1"  (1.549 m)   Wt 47.9 kg   SpO2 98%   BMI 19.97 kg/m  Physical Exam Vitals and nursing note reviewed.  Constitutional:      General: She is not in acute distress.    Appearance: She is well-developed.     Comments: Alert and oriented to self and place.  Thought the year was 2022.  HENT:     Head: Normocephalic and atraumatic.     Right Ear: External ear normal.     Left Ear: External ear normal.     Nose: Nose normal.  Eyes:     Extraocular Movements: Extraocular movements intact.     Conjunctiva/sclera: Conjunctivae normal.     Pupils: Pupils are equal, round, and reactive to light.  Cardiovascular:     Rate and Rhythm: Normal rate and regular rhythm.     Heart sounds: Murmur heard.  Pulmonary:     Effort: Pulmonary  effort is normal. No respiratory distress.     Breath sounds: Normal breath sounds.  Abdominal:     General: Abdomen is flat. There is no distension.     Palpations: Abdomen is soft. There is no mass.     Tenderness: There is no abdominal tenderness. There is no guarding.  Musculoskeletal:     Cervical back: Normal range of motion and neck supple.     Right lower leg: No edema.     Left lower leg: No edema.  Skin:    General: Skin is warm and dry.  Neurological:     Mental Status: She is alert. Mental status is at baseline.     Comments: Cranial nerves grossly intact.  Moving all 4 extremities equally.  Psychiatric:        Mood and Affect: Mood normal.     ED Results /  Procedures / Treatments   Labs (all labs ordered are listed, but only abnormal results are displayed) Labs Reviewed  COMPREHENSIVE METABOLIC PANEL - Abnormal; Notable for the following components:      Result Value   Glucose, Bld 123 (*)    BUN 53 (*)    Creatinine, Ser 1.81 (*)    Calcium 8.8 (*)    GFR, Estimated 27 (*)    All other components within normal limits  CBC WITH DIFFERENTIAL/PLATELET - Abnormal; Notable for the following components:   WBC 2.3 (*)    RBC 3.38 (*)    Hemoglobin 8.8 (*)    HCT 28.1 (*)    RDW 20.7 (*)    Platelets 86 (*)    Neutro Abs 0.1 (*)    All other components within normal limits  TSH - Abnormal; Notable for the following components:   TSH 6.488 (*)    All other components within normal limits  T4, FREE - Abnormal; Notable for the following components:   Free T4 1.48 (*)    All other components within normal limits  TROPONIN I (HIGH SENSITIVITY) - Abnormal; Notable for the following components:   Troponin I (High Sensitivity) 35 (*)    All other components within normal limits  TROPONIN I (HIGH SENSITIVITY) - Abnormal; Notable for the following components:   Troponin I (High Sensitivity) 33 (*)    All other components within normal limits  URINALYSIS, ROUTINE W REFLEX MICROSCOPIC  CBC  BASIC METABOLIC PANEL    EKG EKG Interpretation  Date/Time:  Tuesday October 28 2022 15:09:16 EDT Ventricular Rate:  106 PR Interval:  140 QRS Duration: 85 QT Interval:  314 QTC Calculation: 417 R Axis:   64 Text Interpretation: Interpretation limited secondary to artifact Sinus tachycardia Right atrial enlargement Confirmed by Margaretmary Eddy [phone]) on 10/28/2022 4:11:23 PM  Radiology DG Chest Portable 1 View  Result Date: 10/28/2022 CLINICAL DATA:  weakness EXAM: PORTABLE CHEST - 1 VIEW COMPARISON:  01/30/2013 FINDINGS: Lungs are clear. Heart size and mediastinal contours are within normal limits. No effusion. Cervical fixation hardware partially  visualized. IMPRESSION: No acute cardiopulmonary disease. Electronically Signed   By: Lucrezia Europe M.D.   On: 10/28/2022 17:08    Procedures Procedures   Medications Ordered in ED Medications  lactated ringers infusion ( Intravenous New Bag/Given [id number])  folic acid (FOLVITE) tablet 1 mg (1 mg Oral Given 10/28/22 2241)  magic mouthwash w/lidocaine (5 mLs Oral Given 10/28/22 2243)  acetaminophen (TYLENOL) tablet 650 mg (has no administration in time range)  levothyroxine (SYNTHROID) tablet 75 mcg (has no administration in  time range)  feeding supplement (ENSURE ENLIVE / ENSURE PLUS) liquid 237 mL (has no administration in time range)  lactated ringers bolus 1,000 mL (1,000 mLs Intravenous New Bag/Given 10/28/22 1809)  lidocaine (XYLOCAINE) 2 % viscous mouth solution 15 mL (15 mLs Mouth/Throat Given 10/28/22 1806)    ED Course/ Medical Decision Making/ A&P Clinical Course as of 10/28/22 2339  Tue Oct 28, 2022  1748 HCT(!): 28.1 Baseline of 9.4 [RP]  1748 Creatinine(!): 1.81 Baseline 1.2 [RP]  1825 Dr Benay Spice from hematology recommends repeating cbc tm with diff and holding methotrexate. They will see in the morning.  [RP]  1847 Dr Flossie Buffy from hospitalist will admit the patient. [RP]    Clinical Course User Index [RP] Fransico Meadow, MD                            Medical Decision Making Amount and/or Complexity of Data Reviewed Labs: ordered. Decision-making details documented in ED Course. Radiology: ordered.  Risk Prescription drug management. Decision regarding hospitalization.   Ranetta ELIJAH HIRTZ is a 88 y.o. female with comorbidities that complicate the patient evaluation including hypothyroidism on levothyroxine, hypertension, hyperlipidemia, rheumatoid arthritis on methotrexate, and large granular lymphocytic leukemia who presents to the emergency department with generalized weakness.     Initial Ddx:  Dehydration, oral ulcer, anemia, UTI, hypothyroidism, MI  MDM:   Feel the patient likely is dehydrated in the setting of her oral ulcer.  Will check to see if she has any electrolyte abnormalities or AKI.  Could potentially be anemic with her history of leukemia.  Will check her thyroid studies as well.  With her advanced age will check troponin and EKGs to evaluate for MI but feel this is less the cause of her symptoms.  Plan:  Labs Troponin TSH and free T4 Urinalysis Chest x-ray EKG IV fluids  ED Summary/Re-evaluation:  Patient underwent the above workup.  She was found to be neutropenic which she is at baseline.  No fevers here in the emergency department or at home were reported.  Did have AKI and was given IV fluids.  TSH and free T4 were pending at the time of admission.  She had a troponin that was marginally elevated at 35 and repeat down trended to 33.  No anginal symptoms so feel this is not likely the cause of her weakness and that is more likely dehydration.  Discussed with oncology who recommends holding the methotrexate.  They will see the patient in the morning.  Patient admitted to medicine for further management.  This patient presents to the ED for concern of complaints listed in HPI, this involves an extensive number of treatment options, and is a complaint that carries with it a high risk of complications and morbidity. Disposition including potential need for admission considered.   Dispo: Admit to Floor  Additional history obtained from son Records reviewed Outpatient Clinic Notes The following labs were independently interpreted: Chemistry and show AKI I independently reviewed the following imaging with scope of interpretation limited to determining acute life threatening conditions related to emergency care: Chest x-ray and agree with the radiologist interpretation with the following exceptions: none I personally reviewed and interpreted cardiac monitoring: normal sinus rhythm  I personally reviewed and interpreted the pt's EKG:  see above for interpretation  I have reviewed the patients home medications and made adjustments as needed Consults: Hospitalist and oncology Social Determinants of health:  Elderly  Final Clinical Impression(s) /  ED Diagnoses Final diagnoses:  Dehydration  AKI (acute kidney injury) ([place])  Anemia, unspecified type  Neutropenia, unspecified type [facility])    Rx / DC Orders ED Discharge Orders     None         Fransico Meadow, MD 10/28/22 2339

## 2022-10-28 NOTE — Assessment & Plan Note (Signed)
Creatinine elevated to 1.81 from 1.04.  -keep on continuous IV fluid  -hold nephrotoxic agents

## 2022-10-28 NOTE — Assessment & Plan Note (Signed)
-  hold Jardiance with AKI -no hyperglycemia so will hold sliding scale insulin

## 2022-10-28 NOTE — Assessment & Plan Note (Signed)
Holding home ACE-I in the setting of AKI

## 2022-10-28 NOTE — Assessment & Plan Note (Signed)
-  Abs Neutrophil of 0.1 on presentation. Has been an issue in recent past as well -However no fever. Will continue to follow.

## 2022-10-28 NOTE — Assessment & Plan Note (Signed)
-  continue levothyroxine -repeat TSH ordered in ED is pending

## 2022-10-28 NOTE — Assessment & Plan Note (Signed)
-  hold Leflunomide for now

## 2022-10-28 NOTE — Assessment & Plan Note (Signed)
-  Has hx of iron deficiency anemia at baseline but has new leukopenia and thrombocytopenia -could be secondary to myelosuppression with methotrexate in the setting of AKI -EDP discussed with Dr. Benay Spice on-call oncologist who recommends holding methotrexate and will consult in the morning

## 2022-10-29 ENCOUNTER — Encounter (HOSPITAL_COMMUNITY): Payer: Self-pay | Admitting: Family Medicine

## 2022-10-29 DIAGNOSIS — Z8249 Family history of ischemic heart disease and other diseases of the circulatory system: Secondary | ICD-10-CM | POA: Diagnosis not present

## 2022-10-29 DIAGNOSIS — I10 Essential (primary) hypertension: Secondary | ICD-10-CM | POA: Diagnosis not present

## 2022-10-29 DIAGNOSIS — Z7984 Long term (current) use of oral hypoglycemic drugs: Secondary | ICD-10-CM | POA: Diagnosis not present

## 2022-10-29 DIAGNOSIS — I129 Hypertensive chronic kidney disease with stage 1 through stage 4 chronic kidney disease, or unspecified chronic kidney disease: Secondary | ICD-10-CM | POA: Diagnosis not present

## 2022-10-29 DIAGNOSIS — K1230 Oral mucositis (ulcerative), unspecified: Secondary | ICD-10-CM | POA: Diagnosis not present

## 2022-10-29 DIAGNOSIS — R7303 Prediabetes: Secondary | ICD-10-CM | POA: Diagnosis not present

## 2022-10-29 DIAGNOSIS — M069 Rheumatoid arthritis, unspecified: Secondary | ICD-10-CM | POA: Diagnosis not present

## 2022-10-29 DIAGNOSIS — Z79631 Long term (current) use of antimetabolite agent: Secondary | ICD-10-CM | POA: Diagnosis not present

## 2022-10-29 DIAGNOSIS — D701 Agranulocytosis secondary to cancer chemotherapy: Secondary | ICD-10-CM | POA: Diagnosis not present

## 2022-10-29 DIAGNOSIS — E785 Hyperlipidemia, unspecified: Secondary | ICD-10-CM | POA: Diagnosis not present

## 2022-10-29 DIAGNOSIS — T451X5A Adverse effect of antineoplastic and immunosuppressive drugs, initial encounter: Secondary | ICD-10-CM | POA: Diagnosis not present

## 2022-10-29 DIAGNOSIS — Z79890 Hormone replacement therapy (postmenopausal): Secondary | ICD-10-CM | POA: Diagnosis not present

## 2022-10-29 DIAGNOSIS — N1831 Chronic kidney disease, stage 3a: Secondary | ICD-10-CM | POA: Diagnosis not present

## 2022-10-29 DIAGNOSIS — D61810 Antineoplastic chemotherapy induced pancytopenia: Secondary | ICD-10-CM | POA: Diagnosis not present

## 2022-10-29 DIAGNOSIS — Z90710 Acquired absence of both cervix and uterus: Secondary | ICD-10-CM | POA: Diagnosis not present

## 2022-10-29 DIAGNOSIS — E039 Hypothyroidism, unspecified: Secondary | ICD-10-CM | POA: Diagnosis not present

## 2022-10-29 DIAGNOSIS — D709 Neutropenia, unspecified: Secondary | ICD-10-CM | POA: Diagnosis not present

## 2022-10-29 DIAGNOSIS — J90 Pleural effusion, not elsewhere classified: Secondary | ICD-10-CM | POA: Diagnosis not present

## 2022-10-29 DIAGNOSIS — R52 Pain, unspecified: Secondary | ICD-10-CM | POA: Diagnosis not present

## 2022-10-29 DIAGNOSIS — K121 Other forms of stomatitis: Secondary | ICD-10-CM | POA: Diagnosis not present

## 2022-10-29 DIAGNOSIS — E860 Dehydration: Secondary | ICD-10-CM

## 2022-10-29 DIAGNOSIS — R5081 Fever presenting with conditions classified elsewhere: Secondary | ICD-10-CM | POA: Diagnosis not present

## 2022-10-29 DIAGNOSIS — R509 Fever, unspecified: Secondary | ICD-10-CM | POA: Diagnosis not present

## 2022-10-29 DIAGNOSIS — K7689 Other specified diseases of liver: Secondary | ICD-10-CM | POA: Diagnosis not present

## 2022-10-29 DIAGNOSIS — D509 Iron deficiency anemia, unspecified: Secondary | ICD-10-CM | POA: Diagnosis not present

## 2022-10-29 DIAGNOSIS — Z66 Do not resuscitate: Secondary | ICD-10-CM | POA: Diagnosis not present

## 2022-10-29 DIAGNOSIS — C91Z0 Other lymphoid leukemia not having achieved remission: Secondary | ICD-10-CM | POA: Diagnosis not present

## 2022-10-29 DIAGNOSIS — N1832 Chronic kidney disease, stage 3b: Secondary | ICD-10-CM | POA: Diagnosis not present

## 2022-10-29 DIAGNOSIS — Z833 Family history of diabetes mellitus: Secondary | ICD-10-CM | POA: Diagnosis not present

## 2022-10-29 DIAGNOSIS — K120 Recurrent oral aphthae: Secondary | ICD-10-CM | POA: Diagnosis not present

## 2022-10-29 DIAGNOSIS — M05721 Rheumatoid arthritis with rheumatoid factor of right elbow without organ or systems involvement: Secondary | ICD-10-CM | POA: Diagnosis not present

## 2022-10-29 DIAGNOSIS — K1231 Oral mucositis (ulcerative) due to antineoplastic therapy: Secondary | ICD-10-CM | POA: Diagnosis not present

## 2022-10-29 DIAGNOSIS — D61818 Other pancytopenia: Secondary | ICD-10-CM | POA: Diagnosis not present

## 2022-10-29 DIAGNOSIS — M0579 Rheumatoid arthritis with rheumatoid factor of multiple sites without organ or systems involvement: Secondary | ICD-10-CM | POA: Diagnosis not present

## 2022-10-29 DIAGNOSIS — N179 Acute kidney failure, unspecified: Secondary | ICD-10-CM | POA: Diagnosis not present

## 2022-10-29 LAB — URINALYSIS, ROUTINE W REFLEX MICROSCOPIC
Bilirubin Urine: NEGATIVE
Glucose, UA: >=500 mg/dL — AB
Hgb urine dipstick: NEGATIVE
Ketones, ur: NEGATIVE mg/dL
Leukocytes,Ua: NEGATIVE
Nitrite: NEGATIVE
Protein, ur: NEGATIVE mg/dL
Specific Gravity, Urine: 1.016 (ref 1.005–1.030)
pH: 5 (ref 5.0–8.0)

## 2022-10-29 LAB — BASIC METABOLIC PANEL
Anion gap: 8 (ref 5–15)
BUN: 44 mg/dL — ABNORMAL HIGH (ref 8–23)
CO2: 23 mmol/L (ref 22–32)
Calcium: 8.3 mg/dL — ABNORMAL LOW (ref 8.9–10.3)
Chloride: 104 mmol/L (ref 98–111)
Creatinine, Ser: 1.25 mg/dL — ABNORMAL HIGH (ref 0.44–1.00)
GFR, Estimated: 42 mL/min — ABNORMAL LOW (ref >60)
Glucose, Bld: 94 mg/dL (ref 70–99)
Potassium: 3.6 mmol/L (ref 3.5–5.1)
Sodium: 135 mmol/L (ref 135–145)

## 2022-10-29 LAB — CBC
HCT: 22.4 % — ABNORMAL LOW (ref 36.0–46.0)
Hemoglobin: 6.9 g/dL — CL (ref 12.0–15.0)
MCH: 25.8 pg — ABNORMAL LOW (ref 26.0–34.0)
MCHC: 30.8 g/dL (ref 30.0–36.0)
MCV: 83.9 fL (ref 80.0–100.0)
Platelets: 81 10*3/uL — ABNORMAL LOW (ref 150–400)
RBC: 2.67 MIL/uL — ABNORMAL LOW (ref 3.87–5.11)
RDW: 20.6 % — ABNORMAL HIGH (ref 11.5–15.5)
WBC: 5.5 10*3/uL (ref 4.0–10.5)
nRBC: 0 % (ref 0.0–0.2)

## 2022-10-29 LAB — PREPARE RBC (CROSSMATCH)

## 2022-10-29 MED ORDER — DEXTROSE 5 % IV SOLN
10.0000 mg/kg | INTRAVENOUS | Status: DC
  Administered 2022-10-29 – 2022-10-30 (×2): 480 mg via INTRAVENOUS
  Filled 2022-10-29 (×2): qty 9.6

## 2022-10-29 MED ORDER — BIOTENE DRY MOUTH MT LIQD
15.0000 mL | Freq: Four times a day (QID) | OROMUCOSAL | Status: DC
  Administered 2022-10-29 – 2022-11-07 (×34): 15 mL via OROMUCOSAL

## 2022-10-29 MED ORDER — SODIUM CHLORIDE 0.9% IV SOLUTION: Freq: Once | INTRAVENOUS | Status: DC

## 2022-10-29 MED ORDER — FILGRASTIM 480 MCG/1.6ML IJ SOLN
480.0000 ug | Freq: Every day | INTRAMUSCULAR | Status: DC
  Administered 2022-10-29 – 2022-11-02 (×5): 480 ug via SUBCUTANEOUS
  Filled 2022-10-29 (×5): qty 1.6

## 2022-10-29 MED ORDER — FUROSEMIDE 10 MG/ML IJ SOLN
20.0000 mg | Freq: Once | INTRAMUSCULAR | Status: AC
  Administered 2022-10-29: 20 mg via INTRAVENOUS
  Filled 2022-10-29: qty 2

## 2022-10-29 MED ORDER — INFLUENZA VAC A&B SA ADJ QUAD 0.5 ML IM PRSY
0.5000 mL | PREFILLED_SYRINGE | INTRAMUSCULAR | Status: DC
  Filled 2022-10-29: qty 0.5

## 2022-10-29 MED ORDER — MAGIC MOUTHWASH
15.0000 mL | Freq: Four times a day (QID) | ORAL | Status: DC
  Administered 2022-10-29 – 2022-11-03 (×21): 15 mL via ORAL
  Filled 2022-10-29 (×22): qty 15

## 2022-10-29 MED ORDER — ACETAMINOPHEN 325 MG PO TABS
650.0000 mg | ORAL_TABLET | Freq: Once | ORAL | Status: DC
  Filled 2022-10-29: qty 2

## 2022-10-29 NOTE — Progress Notes (Signed)
Mobility Specialist - Progress Note   10/29/22 0952  Mobility  Activity Ambulated with assistance in hallway  Level of Assistance Standby assist, set-up cues, supervision of patient - no hands on  Assistive Device [place];Other (Comment) (IV Pole on L hand)  Distance Ambulated (ft) 250 ft  Activity Response Tolerated well  Mobility Referral Yes  $Mobility charge 1 Mobility   Pt received in bed and agreeable to mobility. Pts son stated pt was weak due to poor nutrition intake. Once standing pt had some LOB that was self corrected. No complaints during session. Pt to bed after session w/ all needs met & son in room.   [facility]

## 2022-10-29 NOTE — Consult Note (Signed)
I have been asked to see [name] [name].  She is well-known to me.  She is a very charming 88 year old white female.  She has a history of large granular lymphocytic leukemia.  She has underlying rheumatoid arthritis.  I think first saw her back in November 2022.Marland Kitchen  She has had problems with neutropenia because of the leukemia.  Methotrexate, I thought would be able to help with this.  Occasionally, she does have a slight bump in her [place].  She is on methotrexate 20 mg a week.  Think I last saw her about 4 weeks ago.  She now is admitted with mucositis.  She has pain in the oral cavity, mostly on the right side.  She has had no fever.  She not be able to eat because of the pain.  When she came in on 10/28/2022, her white cell count was 2.3.  Hemoglobin 8.8.  Platelet count 86,000.  Her ANC was 100.  Her sodium is 137.  Potassium 3.8.  BUN 53 creatinine 1.81.  Calcium 8.8 with an albumin of 3.6.  She had a chest x-ray when she came in which was unremarkable.  There is been no diarrhea.  She has had no bleeding.  She has had no rashes.  There is been no swollen lymph nodes.  She had no change in her medications.  She is on leflunomide for the rheumatoid arthritis.  Seeing her med list, that for some reason she also is on methotrexate 3 times a week.  I will have to double check this.   On physical exam, her vital signs are temperature of 98.4.  Pulse 95.  Blood pressure 135/50.  Her head and neck exam does show some mucositis, mostly on the right buccal mucosa.  There may be little bit of mucositis on her lips.  There is no adenopathy in the neck.  Lungs are clear bilaterally.  Cardiac exam regular rate and rhythm.  Abdomen is soft.  Bowel sounds are active.  There is no fluid wave.  There is no guarding or rebound tenderness.  Extremity shows no clubbing, cyanosis or edema.  Skin exam shows no rashes.  Neurological exam shows no focal neurological deficits.   [name] [name] has marked neutropenia.   I am sure this is from the methotrexate, in addition to the LGL.  Again I would also be troubled by the fact that she may be taking more methotrexate than she should be.  Her hemoglobin and platelet count are also back down which really have not been a problem for her.  She clearly needs to have Neupogen.  I will start her on this.  We probably need to get her on an antiviral also.  I just hate that this has happened to her.  I am going to just stop the methotrexate.  It is hard to know how else we can try to treat the LGL.  She is somewhat elderly and frail.  Our goal right now is just to get the white cell count up so that she can heal up this mucositis.  We will have to follow her labs closely.  I know that she will get incredible care from everybody on 4 E.  [name] Haw, MD  [name] [name] 1:5

## 2022-10-29 NOTE — Progress Notes (Signed)
Mobility Specialist - Progress Note   10/29/22 1309  Mobility  Activity Ambulated with assistance in hallway  Level of Assistance Standby assist, set-up cues, supervision of patient - no hands on  Assistive Device Cane  Distance Ambulated (ft) 500 ft  Activity Response Tolerated well  Mobility Referral Yes  $Mobility charge 1 Mobility   Pt received in recliner and agreeable to mobility. No complaints during session. Pt to recliner after session with all needs met.    [facility]

## 2022-10-29 NOTE — Progress Notes (Signed)
  Transition of Care [facility]) Screening Note   Patient Details  Name: [name] [name] Date of Birth: Dec 21, 1934   Transition of Care [facility]) CM/SW Contact:    Phyllis Ginger, RN Phone Number: 10/29/2022, 4:58 PM    Transition of Care Department [facility]) has reviewed patient and no TOC needs have been identified at this time. We will continue to monitor patient advancement through interdisciplinary progression rounds. If new patient transition needs arise, please place a TOC consult.

## 2022-10-29 NOTE — Progress Notes (Signed)
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  Patient seen in the ED, noted to be pancytopenic and also noted to be in AKI and subsequently admitted for further evaluation and management.   Assessment & Plan:   Principal Problem:   Acute renal failure superimposed on stage 3a chronic kidney disease (HCC) Active Problems:   Hypothyroidism   Essential hypertension   Rheumatoid arthritis (HCC)   Neutropenia (HCC)   Iron deficiency anemia   Large granular lymphocytic leukemia (HCC)   AKI (acute kidney injury) ([place])   Pancytopenia (HCC)   Stomatitis   Pre-diabetes   Dehydration  #1 acute renal failure superimposed on CKD stage IIIa -Likely secondary to prerenal azotemia in the setting of poor oral intake. -Creatinine noted on admission to be 1.81 with previous creatinine at 1.04 on 10/09/2022. -Urinalysis nitrite negative leukocyte negative greater than 500 glucose, negative for protein. -Renal function improving with hydration. -Supportive care.  2.  Prediabetes -Jardiance on hold with AKI. -Outpatient follow-up.  3.  Mucositis/stomatitis -Felt likely secondary to methotrexate toxicity in the setting of AKI. -Continue Magic mouthwash with lidocaine 3 times daily.  4.  Pancytopenia -Patient with history of iron deficiency anemia at baseline, now neutropenic with thrombocytopenia. -Could be secondary to myelosuppression from methotrexate in the setting of AKI. -Son states gives his mother methotrexate 2.5 mg tablets 3 times weekly to make up for the once a week  dose of 6 mg daily. -Patient seen in consultation by oncology and patient started on Neupogen. -No sign or symptoms of infection. -Hemoglobin noted at 6.9 this morning, transfuse 2 units packed red blood cells. -Repeat labs in the AM.  5.  Large granular lymphocytic leukemia -Noted to be on methotrexate which is currently on hold due to concerns for myelo suppression of stomatitis. -Oncology following.  6.  Rheumatoid arthritis -Continue to hold leflunomide.  7.  Hypertension Continue to hold ACE inhibitor in the setting of AKI.  8.  Hypothyroidism Continue Synthroid.    DVT prophylaxis: SCDs Code Status: DNR Family Communication: Updated son at bedside. Disposition: Home when clinically improved, improved oral intake, resolution of neutropenia and when cleared by hematology/oncology.  Status is: Inpatient Remains inpatient appropriate because: Severity of illness   Consultants:  Hematology/oncology: Dr. Marin Olp 10/29/2022  Procedures:  Transfuse 2 units packed red blood cells 10/29/2022 pending Chest x-ray 10/28/2022   Antimicrobials:  IV acyclovir 10/29/2022    Subjective: Sitting up in recliner.  Still with difficulty swallowing due to mouth pain.  States some slight improvement with Magic mouthwash with lidocaine but pain returns.  No overt bleeding.  No chest pain.  No shortness of breath.  Son at bedside states was given patient methotrexate 2.5 mg tablets 3 times a week to equal the one-time dose of methotrexate 6 mg ordered.  Patient asking when she is going to be able to go go home.  Objective: Vitals:   10/29/22 0930 10/29/22 1247 10/29/22 1950 10/29/22 2009  BP: (!) 124/51 (!) 107/47 (!) 111/58 (!) 109/50  Pulse: (!) 108 99 100 100  Resp: 18 18 18 16   Temp: 98.5 F (36.9 C)  98.1 F (36.7 C) 100.3 F (37.9 C) 100.1 F (37.8 C)  TempSrc: Oral Oral Oral Oral  SpO2: 97% 100% 100% (!) 67%  Weight:      Height:        Intake/Output Summary (Last 24  hours) at 10/29/2022 2033 Last data filed at 10/29/2022 1440 Gross per 24 hour  Intake 837.17 ml  Output --  Net 837.17 ml   Filed Weights   10/28/22 2147  Weight: 47.9 kg    Examination:  General exam: Appears calm and comfortable.  Pallor. Respiratory system: Clear to auscultation.  No wheezes, no crackles, no rhonchi.  Fair air movement.  Speaking in full sentences.  Respiratory effort normal. Cardiovascular system: S1 & S2 heard, RRR. No JVD, murmurs, rubs, gallops or clicks. No pedal edema. Gastrointestinal system: Abdomen is nondistended, soft and nontender. No organomegaly or masses felt. Normal bowel sounds heard. Central nervous system: Alert and oriented. No focal neurological deficits. Extremities: Symmetric 5 x 5 power. Skin: No rashes, lesions or ulcers Psychiatry: Judgement and insight appear normal. Mood & affect appropriate.     Data Reviewed: I have personally reviewed following labs and imaging studies  CBC: Recent Labs  Lab 10/28/22 1630 10/29/22 0538  WBC 2.3* 5.5  NEUTROABS 0.1*  --   HGB 8.8* 6.9*  HCT 28.1* 22.4*  MCV 83.1 83.9  PLT 86* 81*    Basic Metabolic Panel: Recent Labs  Lab 10/28/22 1630 10/29/22 0538  NA 137 135  K 3.8 3.6  CL 101 104  CO2 22 23  GLUCOSE 123* 94  BUN 53* 44*  CREATININE 1.81* 1.25*  CALCIUM 8.8* 8.3*    GFR: Estimated Creatinine Clearance: 24.8 mL/min (A) (by C-G formula based on SCr of 1.25 mg/dL (H)).  Liver Function Tests: Recent Labs  Lab 10/28/22 1630  AST 28  ALT 26  ALKPHOS 61  BILITOT 0.6  PROT 7.5  ALBUMIN 3.6    CBG: No results for input(s): "GLUCAP" in the last 168 hours.   No results found for this or any previous visit (from the past 240 hour(s)).       Radiology Studies: DG Chest Portable 1 View  Result Date: 10/28/2022 CLINICAL DATA:  weakness EXAM: PORTABLE CHEST - 1 VIEW COMPARISON:  01/30/2013 FINDINGS: Lungs are clear. Heart size and mediastinal contours are within  normal limits. No effusion. Cervical fixation hardware partially visualized. IMPRESSION: No acute cardiopulmonary disease. Electronically Signed   By: Lucrezia Europe M.D.   On: 10/28/2022 17:08        Scheduled Meds:  sodium chloride   Intravenous Once   acetaminophen  650 mg Oral Once   antiseptic oral rinse  15 mL Mouth Rinse Q6H   feeding supplement  237 mL Oral BID BM   filgrastim  480 mcg Subcutaneous Daily   folic acid  1 mg Oral Daily   furosemide  20 mg Intravenous Once   [START ON 10/30/2022] influenza vaccine adjuvanted  0.5 mL Intramuscular Tomorrow-1000   levothyroxine  75 mcg Oral Q0600   magic mouthwash w/lidocaine  5 mL Oral TID   magic mouthwash  15 mL Oral QID   Continuous Infusions:  acyclovir (ZOVIRAX) 480 mg in dextrose 5 % 100 mL IVPB 480 mg (10/29/22 0839)     LOS: 0 days    Time spent: 40 minutes    Irine Seal, MD [facility]   To contact the attending provider between 7A-7P or the covering provider during after hours 7P-7A,  please log into the web site [email or web address] and access using universal [place] password for that web site. If you do not have the password, please call the hospital operator.  10/29/2022, 8:33 PM

## 2022-10-29 NOTE — Progress Notes (Signed)
Pharmacy Antibiotic Note  [name] [name] is a 88 y.o. female admitted on 10/28/2022 with  mucositis .  Pharmacy has been consulted for acyclovir  dosing.  Pt on mthotrexate PTA to treat leukemia. ANC also low. Being started on Neupogen.   Plan: Acyclovir 480 mg IV q24h ( 10 mg/kg)  Pt has LR at 75 ml/hr ordered.   Height: 5\' 1"  (154.9 cm) Weight: 47.9 kg (105 lb 11.2 oz) IBW/kg (Calculated) : 47.8  Temp (24hrs), Avg:98.8 F (37.1 C), Min:98.2 F (36.8 C), Max:99.4 F (37.4 C)  Recent Labs  Lab 10/28/22 1630  WBC 2.3*  CREATININE 1.81*    Estimated Creatinine Clearance: 17.1 mL/min (A) (by C-G formula based on SCr of 1.81 mg/dL (H)).    Allergies  Allergen Reactions   Tramadol Nausea Only   Codeine Phosphate Nausea Only    REACTION: unspecified     Thank you for allowing pharmacy to be a part of this patient's care.   Royetta Asal, PharmD, BCPS 10/29/2022 7:38 AM

## 2022-10-30 ENCOUNTER — Inpatient Hospital Stay: Payer: Medicare HMO

## 2022-10-30 ENCOUNTER — Other Ambulatory Visit: Payer: Self-pay | Admitting: Hematology & Oncology

## 2022-10-30 ENCOUNTER — Other Ambulatory Visit: Payer: Self-pay | Admitting: Family Medicine

## 2022-10-30 ENCOUNTER — Inpatient Hospital Stay: Payer: Medicare HMO | Admitting: Hematology & Oncology

## 2022-10-30 DIAGNOSIS — N179 Acute kidney failure, unspecified: Secondary | ICD-10-CM | POA: Diagnosis not present

## 2022-10-30 DIAGNOSIS — E860 Dehydration: Secondary | ICD-10-CM | POA: Diagnosis not present

## 2022-10-30 DIAGNOSIS — C91Z0 Other lymphoid leukemia not having achieved remission: Secondary | ICD-10-CM

## 2022-10-30 DIAGNOSIS — N1832 Chronic kidney disease, stage 3b: Secondary | ICD-10-CM

## 2022-10-30 DIAGNOSIS — D509 Iron deficiency anemia, unspecified: Secondary | ICD-10-CM | POA: Diagnosis not present

## 2022-10-30 DIAGNOSIS — I10 Essential (primary) hypertension: Secondary | ICD-10-CM

## 2022-10-30 LAB — CBC WITH DIFFERENTIAL/PLATELET
Abs Immature Granulocytes: 0.02 10*3/uL (ref 0.00–0.07)
Basophils Absolute: 0 10*3/uL (ref 0.0–0.1)
Basophils Relative: 0 %
Eosinophils Absolute: 0.1 10*3/uL (ref 0.0–0.5)
Eosinophils Relative: 1 %
HCT: 31.7 % — ABNORMAL LOW (ref 36.0–46.0)
Hemoglobin: 10.3 g/dL — ABNORMAL LOW (ref 12.0–15.0)
Immature Granulocytes: 1 %
Lymphocytes Relative: 93 %
Lymphs Abs: 3.4 10*3/uL (ref 0.7–4.0)
MCH: 27.2 pg (ref 26.0–34.0)
MCHC: 32.5 g/dL (ref 30.0–36.0)
MCV: 83.6 fL (ref 80.0–100.0)
Monocytes Absolute: 0.1 10*3/uL (ref 0.1–1.0)
Monocytes Relative: 3 %
Neutro Abs: 0.1 10*3/uL — CL (ref 1.7–7.7)
Neutrophils Relative %: 2 %
Platelets: 92 10*3/uL — ABNORMAL LOW (ref 150–400)
RBC: 3.79 MIL/uL — ABNORMAL LOW (ref 3.87–5.11)
RDW: 19.7 % — ABNORMAL HIGH (ref 11.5–15.5)
WBC: 3.6 10*3/uL — ABNORMAL LOW (ref 4.0–10.5)
nRBC: 0 % (ref 0.0–0.2)

## 2022-10-30 LAB — TYPE AND SCREEN
ABO/RH(D): O POS
Antibody Screen: NEGATIVE
Unit division: 0
Unit division: 0

## 2022-10-30 LAB — COMPREHENSIVE METABOLIC PANEL
ALT: 20 U/L (ref 0–44)
AST: 26 U/L (ref 15–41)
Albumin: 2.9 g/dL — ABNORMAL LOW (ref 3.5–5.0)
Alkaline Phosphatase: 56 U/L (ref 38–126)
Anion gap: 9 (ref 5–15)
BUN: 29 mg/dL — ABNORMAL HIGH (ref 8–23)
CO2: 24 mmol/L (ref 22–32)
Calcium: 8.5 mg/dL — ABNORMAL LOW (ref 8.9–10.3)
Chloride: 104 mmol/L (ref 98–111)
Creatinine, Ser: 0.97 mg/dL (ref 0.44–1.00)
GFR, Estimated: 57 mL/min — ABNORMAL LOW (ref >60)
Glucose, Bld: 92 mg/dL (ref 70–99)
Potassium: 3.5 mmol/L (ref 3.5–5.1)
Sodium: 137 mmol/L (ref 135–145)
Total Bilirubin: 0.8 mg/dL (ref 0.3–1.2)
Total Protein: 6.3 g/dL — ABNORMAL LOW (ref 6.5–8.1)

## 2022-10-30 LAB — BPAM RBC
Blood Product Expiration Date: 202404252359
Blood Product Expiration Date: 202404252359
ISSUE DATE / TIME: 202403271942
ISSUE DATE / TIME: 202403272237
Unit Type and Rh: 5100
Unit Type and Rh: 5100

## 2022-10-30 LAB — MAGNESIUM: Magnesium: 1.7 mg/dL (ref 1.7–2.4)

## 2022-10-30 MED ORDER — DEXTROSE 5 % IV SOLN
10.0000 mg/kg | Freq: Two times a day (BID) | INTRAVENOUS | Status: DC
  Administered 2022-10-30 – 2022-11-02 (×7): 480 mg via INTRAVENOUS
  Filled 2022-10-30 (×8): qty 9.6

## 2022-10-30 MED ORDER — SODIUM CHLORIDE 0.9 % IV SOLN: INTRAVENOUS | Status: AC

## 2022-10-30 MED ORDER — POTASSIUM CHLORIDE CRYS ER 10 MEQ PO TBCR
20.0000 meq | EXTENDED_RELEASE_TABLET | Freq: Once | ORAL | Status: AC
  Administered 2022-10-30: 20 meq via ORAL
  Filled 2022-10-30: qty 2

## 2022-10-30 MED ORDER — MAGNESIUM SULFATE 4 GM/100ML IV SOLN
4.0000 g | Freq: Once | INTRAVENOUS | Status: AC
  Administered 2022-10-30: 4 g via INTRAVENOUS
  Filled 2022-10-30: qty 100

## 2022-10-30 NOTE — Progress Notes (Signed)
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  Patient seen in the ED, noted to be pancytopenic and also noted to be in AKI and subsequently admitted for further evaluation and management.   Assessment & Plan:   Principal Problem:   Acute renal failure superimposed on stage 3a chronic kidney disease (HCC) Active Problems:   Hypothyroidism   Essential hypertension   Rheumatoid arthritis (HCC)   Neutropenia (HCC)   Iron deficiency anemia   Large granular lymphocytic leukemia (HCC)   AKI (acute kidney injury) ([place])   Pancytopenia (HCC)   Stomatitis   Pre-diabetes   Dehydration  #1 acute renal failure superimposed on CKD stage IIIa -Likely secondary to prerenal azotemia in the setting of poor oral intake. -Creatinine noted on admission to be 1.81 with previous creatinine at 1.04 on 10/09/2022. -Urinalysis nitrite negative leukocyte negative greater than 500 glucose, negative for protein. -Renal function improved with hydration, creatinine down to 0.97.   -Continue gentle hydration.   -Supportive care.   2.  Prediabetes -Jardiance on hold with AKI. -Outpatient follow-up.  3.  Mucositis/stomatitis -Felt likely secondary to methotrexate toxicity in the setting of AKI. -Patient still with significant pain and minimal oral intake per son. -Continue Magic mouthwash with lidocaine 3 times daily.  4.  Pancytopenia -Patient with history of iron deficiency anemia at baseline, now neutropenic with thrombocytopenia. -Could be secondary to myelosuppression from methotrexate  in the setting of AKI. -Son states gives his mother methotrexate 2.5 mg tablets 3 times weekly to make up for the once a week dose of 6 mg daily. -Patient seen in consultation by oncology and patient started on Neupogen. -No sign or symptoms of infection. -Hemoglobin at 10.3 from 6.9 status post transfusion 2 units PRBCs 10/29/2022. -Follow H&H. -Repeat labs in the AM.  5.  Large granular lymphocytic leukemia -Noted to be on methotrexate which is currently on hold due to concerns for myelo suppression of stomatitis. -Oncology following.  6.  Rheumatoid arthritis -Continue to hold leflunomide.  7.  Hypertension -BP soft.   -Continue to hold ACE inhibitor in setting of AKI.   8.  Hypothyroidism -Synthroid    DVT prophylaxis: SCDs Code Status: DNR Family Communication: Updated son at bedside. Disposition: Home when clinically improved, improved oral intake, resolution of neutropenia and when cleared by hematology/oncology.  Status is: Inpatient Remains inpatient appropriate because: Severity of illness   Consultants:  Hematology/oncology: Dr. Marin Olp 10/29/2022  Procedures:  Transfuse 2 units packed red blood cells 10/29/2022  Chest x-ray 10/28/2022   Antimicrobials:  IV acyclovir 10/29/2022    Subjective: Patient sitting up in bed.  Son at bedside.  No chest pain.  No shortness of breath.  No abdominal pain.  Still with difficulty eating due to mucositis and pain with oral intake.  Per son patient with significantly minimal oral intake.    Objective: Vitals:   10/29/22 2226 10/29/22 2303 10/30/22 0137 10/30/22 1130  BP: (!) 116/52 (!) 115/55 (!) 115/57 (!) 126/55  Pulse: 92 87 84 (!) 101  Resp: 16 16 16 16   Temp: 98.9 F (37.2 C) 98 F (36.7 C) 98.2 F (  36.8 C) 98.3 F (36.8 C)  TempSrc: Oral Oral Oral Oral  SpO2: 98% 98% 99% 98%  Weight:      Height:        Intake/Output Summary (Last 24 hours) at 10/30/2022 1250 Last data filed at 10/30/2022 1032 Gross per  24 hour  Intake 940 ml  Output 1000 ml  Net -60 ml    Filed Weights   10/28/22 2147  Weight: 47.9 kg    Examination:  General exam: NAD.  Dry mucous membranes. Respiratory system: Lungs clear to auscultation bilaterally.  No wheezes, no crackles, no rhonchi.  Fair air movement.  Speaking in full sentences.  Normal respiratory effort.   Cardiovascular system: RRR with 3/6 SEM.  No JVD.  No lower extremity edema. Gastrointestinal system: Abdomen is soft, nontender, nondistended, positive bowel sounds.  No rebound.  No guarding.  Central nervous system: Alert and oriented. No focal neurological deficits. Extremities: Symmetric 5 x 5 power. Skin: No rashes, lesions or ulcers Psychiatry: Judgement and insight appear normal. Mood & affect appropriate.     Data Reviewed: I have personally reviewed following labs and imaging studies  CBC: Recent Labs  Lab 10/28/22 1630 10/29/22 0538 10/30/22 0444  WBC 2.3* 5.5 3.6*  NEUTROABS 0.1*  --  0.1*  HGB 8.8* 6.9* 10.3*  HCT 28.1* 22.4* 31.7*  MCV 83.1 83.9 83.6  PLT 86* 81* 92*     Basic Metabolic Panel: Recent Labs  Lab 10/28/22 1630 10/29/22 0538 10/30/22 0444  NA 137 135 137  K 3.8 3.6 3.5  CL 101 104 104  CO2 22 23 24   GLUCOSE 123* 94 92  BUN 53* 44* 29*  CREATININE 1.81* 1.25* 0.97  CALCIUM 8.8* 8.3* 8.5*  MG  --   --  1.7     GFR: Estimated Creatinine Clearance: 32 mL/min (by C-G formula based on SCr of 0.97 mg/dL).  Liver Function Tests: Recent Labs  Lab 10/28/22 1630 10/30/22 0444  AST 28 26  ALT 26 20  ALKPHOS 61 56  BILITOT 0.6 0.8  PROT 7.5 6.3*  ALBUMIN 3.6 2.9*     CBG: No results for input(s): "GLUCAP" in the last 168 hours.   No results found for this or any previous visit (from the past 240 hour(s)).       Radiology Studies: DG Chest Portable 1 View  Result Date: 10/28/2022 CLINICAL DATA:  weakness EXAM: PORTABLE CHEST - 1 VIEW COMPARISON:  01/30/2013 FINDINGS: Lungs are clear.  Heart size and mediastinal contours are within normal limits. No effusion. Cervical fixation hardware partially visualized. IMPRESSION: No acute cardiopulmonary disease. Electronically Signed   By: Lucrezia Europe M.D.   On: 10/28/2022 17:08        Scheduled Meds:  sodium chloride   Intravenous Once   acetaminophen  650 mg Oral Once   antiseptic oral rinse  15 mL Mouth Rinse Q6H   feeding supplement  237 mL Oral BID BM   filgrastim  480 mcg Subcutaneous Daily   folic acid  1 mg Oral Daily   influenza vaccine adjuvanted  0.5 mL Intramuscular Tomorrow-1000   levothyroxine  75 mcg Oral Q0600   magic mouthwash w/lidocaine  5 mL Oral TID   magic mouthwash  15 mL Oral QID   Continuous Infusions:  acyclovir (ZOVIRAX) 480 mg in dextrose 5 % 100 mL IVPB     magnesium sulfate bolus IVPB 4 g (10/30/22 1101)     LOS: 1 day    Time  spent: 40 minutes    Irine Seal, MD [facility]   To contact the attending provider between 7A-7P or the covering provider during after hours 7P-7A, please log into the web site [email or web address] and access using universal [place] password for that web site. If you do not have the password, please call the hospital operator.  10/30/2022, 12:50 PM

## 2022-10-30 NOTE — Progress Notes (Signed)
Mobility Specialist - Progress Note   10/30/22 1126  Mobility  Activity Ambulated with assistance in hallway  Level of Assistance Standby assist, set-up cues, supervision of patient - no hands on  Assistive Device Cane  Distance Ambulated (ft) 250 ft  Activity Response Tolerated well  Mobility Referral Yes  $Mobility charge 1 Mobility   Pt received in bed and agreeable to mobility. Pt had a slower gait today due to feeling weak from not being able to eat. No complaints during session. Pt to bed after session with all needs met & son in room.   [facility]

## 2022-10-30 NOTE — Plan of Care (Signed)
  Problem: Clinical Measurements: Goal: Ability to maintain clinical measurements within normal limits will improve Outcome: Progressing Goal: Will remain free from infection Outcome: Progressing   

## 2022-10-30 NOTE — Progress Notes (Signed)
Pharmacy Antibiotic Note  [name] [name] is a 88 y.o. female admitted on 10/28/2022 with  weakness, ARF on CKD, stomatitis likely 2/2 MTX toxicity .  PMH significant for large granular lymphocytic leukemia, RA prescribed MTX PTA. Pharmacy has been consulted for acyclovir dosing.  Today, 10/30/22 Neutropenic (ANC 0.1, WBC 3.6) Afebrile SCr = 1, CrCl 32 mL/min. AKI resolved. LR @ 75 mL/hr (order expired)  Plan: Increase acyclovir to 480 mg (10 mg/kg) IV q12h for improved renal function Resume mIVF: NS @ 75 mL/hr Follow renal function  Height: 5\' 1"  (154.9 cm) Weight: 47.9 kg (105 lb 11.2 oz) IBW/kg (Calculated) : 47.8  Temp (24hrs), Avg:98.8 F (37.1 C), Min:98 F (36.7 C), Max:100.3 F (37.9 C)  Recent Labs  Lab 10/28/22 1630 10/29/22 0538 10/30/22 0444  WBC 2.3* 5.5 3.6*  CREATININE 1.81* 1.25* 0.97    Estimated Creatinine Clearance: 32 mL/min (by C-G formula based on SCr of 0.97 mg/dL).    Allergies  Allergen Reactions   Tramadol Nausea Only   Codeine Phosphate Nausea Only    Antimicrobials this admission: acyclovir 3/27 >>   Dose adjustments this admission: 3/28 Acyclovir 10 mg/kg IV q24h >> 10 mg/kg IV q12h  Lenis Noon, PharmD 10/30/2022 12:27 PM

## 2022-10-31 ENCOUNTER — Inpatient Hospital Stay (HOSPITAL_COMMUNITY): Payer: Medicare HMO

## 2022-10-31 DIAGNOSIS — C91Z0 Other lymphoid leukemia not having achieved remission: Secondary | ICD-10-CM | POA: Diagnosis not present

## 2022-10-31 DIAGNOSIS — D509 Iron deficiency anemia, unspecified: Secondary | ICD-10-CM | POA: Diagnosis not present

## 2022-10-31 DIAGNOSIS — D61818 Other pancytopenia: Secondary | ICD-10-CM | POA: Diagnosis not present

## 2022-10-31 DIAGNOSIS — E860 Dehydration: Secondary | ICD-10-CM | POA: Diagnosis not present

## 2022-10-31 DIAGNOSIS — D709 Neutropenia, unspecified: Secondary | ICD-10-CM

## 2022-10-31 DIAGNOSIS — R5081 Fever presenting with conditions classified elsewhere: Secondary | ICD-10-CM

## 2022-10-31 DIAGNOSIS — R509 Fever, unspecified: Secondary | ICD-10-CM

## 2022-10-31 DIAGNOSIS — N179 Acute kidney failure, unspecified: Secondary | ICD-10-CM | POA: Diagnosis not present

## 2022-10-31 LAB — CBC WITH DIFFERENTIAL/PLATELET
Abs Immature Granulocytes: 0.12 10*3/uL — ABNORMAL HIGH (ref 0.00–0.07)
Basophils Absolute: 0 10*3/uL (ref 0.0–0.1)
Basophils Relative: 0 %
Eosinophils Absolute: 0 10*3/uL (ref 0.0–0.5)
Eosinophils Relative: 1 %
HCT: 31.7 % — ABNORMAL LOW (ref 36.0–46.0)
Hemoglobin: 10.1 g/dL — ABNORMAL LOW (ref 12.0–15.0)
Immature Granulocytes: 4 %
Lymphocytes Relative: 87 %
Lymphs Abs: 2.5 10*3/uL (ref 0.7–4.0)
MCH: 26.9 pg (ref 26.0–34.0)
MCHC: 31.9 g/dL (ref 30.0–36.0)
MCV: 84.5 fL (ref 80.0–100.0)
Monocytes Absolute: 0.2 10*3/uL (ref 0.1–1.0)
Monocytes Relative: 6 %
Neutro Abs: 0.1 10*3/uL — CL (ref 1.7–7.7)
Neutrophils Relative %: 2 %
Platelets: 117 10*3/uL — ABNORMAL LOW (ref 150–400)
RBC: 3.75 MIL/uL — ABNORMAL LOW (ref 3.87–5.11)
RDW: 20.1 % — ABNORMAL HIGH (ref 11.5–15.5)
WBC: 2.8 10*3/uL — ABNORMAL LOW (ref 4.0–10.5)
nRBC: 0 % (ref 0.0–0.2)

## 2022-10-31 LAB — COMPREHENSIVE METABOLIC PANEL
ALT: 20 U/L (ref 0–44)
AST: 34 U/L (ref 15–41)
Albumin: 2.8 g/dL — ABNORMAL LOW (ref 3.5–5.0)
Alkaline Phosphatase: 50 U/L (ref 38–126)
Anion gap: 7 (ref 5–15)
BUN: 17 mg/dL (ref 8–23)
CO2: 22 mmol/L (ref 22–32)
Calcium: 8.1 mg/dL — ABNORMAL LOW (ref 8.9–10.3)
Chloride: 105 mmol/L (ref 98–111)
Creatinine, Ser: 1.07 mg/dL — ABNORMAL HIGH (ref 0.44–1.00)
GFR, Estimated: 51 mL/min — ABNORMAL LOW (ref >60)
Glucose, Bld: 97 mg/dL (ref 70–99)
Potassium: 4 mmol/L (ref 3.5–5.1)
Sodium: 134 mmol/L — ABNORMAL LOW (ref 135–145)
Total Bilirubin: 0.9 mg/dL (ref 0.3–1.2)
Total Protein: 5.9 g/dL — ABNORMAL LOW (ref 6.5–8.1)

## 2022-10-31 LAB — URINALYSIS, W/ REFLEX TO CULTURE (INFECTION SUSPECTED)
Bilirubin Urine: NEGATIVE
Glucose, UA: 150 mg/dL — AB
Hgb urine dipstick: NEGATIVE
Ketones, ur: NEGATIVE mg/dL
Leukocytes,Ua: NEGATIVE
Nitrite: NEGATIVE
Protein, ur: NEGATIVE mg/dL
Specific Gravity, Urine: 1.016 (ref 1.005–1.030)
pH: 5 (ref 5.0–8.0)

## 2022-10-31 LAB — MAGNESIUM: Magnesium: 2.2 mg/dL (ref 1.7–2.4)

## 2022-10-31 MED ORDER — SODIUM CHLORIDE 0.9 % IV SOLN: 2.0000 g | INTRAVENOUS | Status: DC

## 2022-10-31 MED ORDER — SENNOSIDES-DOCUSATE SODIUM 8.6-50 MG PO TABS
1.0000 | ORAL_TABLET | Freq: Two times a day (BID) | ORAL | Status: DC
  Administered 2022-11-01 – 2022-11-07 (×10): 1 via ORAL
  Filled 2022-10-31 (×12): qty 1

## 2022-10-31 MED ORDER — POLYETHYLENE GLYCOL 3350 17 G PO PACK
17.0000 g | PACK | Freq: Every day | ORAL | Status: DC
  Administered 2022-11-07: 17 g via ORAL
  Filled 2022-10-31 (×6): qty 1

## 2022-10-31 MED ORDER — SODIUM CHLORIDE 0.9 % IV SOLN
1.0000 g | Freq: Two times a day (BID) | INTRAVENOUS | Status: DC
  Administered 2022-10-31 – 2022-11-04 (×10): 1 g via INTRAVENOUS
  Filled 2022-10-31 (×11): qty 20

## 2022-10-31 NOTE — Plan of Care (Signed)

## 2022-10-31 NOTE — Progress Notes (Signed)
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  Patient seen in the ED, noted to be pancytopenic and also noted to be in AKI and subsequently admitted for further evaluation and management.   Assessment & Plan:   Principal Problem:   Acute renal failure superimposed on stage 3a chronic kidney disease (HCC) Active Problems:   Neutropenic fever (HCC)   Hypothyroidism   Essential hypertension   Rheumatoid arthritis (HCC)   Neutropenia (HCC)   Iron deficiency anemia   Large granular lymphocytic leukemia (HCC)   AKI (acute kidney injury) ([place])   Pancytopenia (HCC)   Stomatitis   Pre-diabetes   Dehydration  #1 acute renal failure superimposed on CKD stage IIIa -Likely secondary to prerenal azotemia in the setting of poor oral intake. -Creatinine noted on admission to be 1.81 with previous creatinine at 1.04 on 10/09/2022. -Urinalysis nitrite negative leukocyte negative greater than 500 glucose, negative for protein. -Renal function improved with hydration, creatinine down to 1.07.   -Continue gentle hydration.   -Supportive care.   2.  Prediabetes -Jardiance on hold with AKI. -Outpatient follow-up.  3.  Mucositis/stomatitis -Felt likely secondary to methotrexate toxicity in the setting of AKI. -Patient still with significant pain and minimal oral intake per daughter-in-law who is at bedside.. -Continue Magic mouthwash with lidocaine 3 times daily.  4.  Pancytopenia/febrile neutropenia -Patient with history of iron deficiency anemia at baseline, now neutropenic  with thrombocytopenia. -Could be secondary to myelosuppression from methotrexate in the setting of AKI. -Son states gives his mother methotrexate 2.5 mg tablets 3 times weekly to make up for the once a week dose of 6 mg daily. -Patient seen in consultation by oncology and patient started on Neupogen. -Patient initially with no signs or symptoms of infection however overnight noted to have a temperature of 103.1. -Check blood cultures x 2, repeat chest x-ray, check a UA with cultures and sensitivities. -IV cefepime initially ordered this morning however patient started on IV Merrem per oncology. -Continue G-CSF. -Hemoglobin currently at 10.1 from 6.9 status post 2 units PRBCs 10/29/2022. -Repeat labs in the AM.  5.  Large granular lymphocytic leukemia -Noted to be on methotrexate which is currently on hold due to concerns for myelo suppression of stomatitis. -Oncology following.  6.  Rheumatoid arthritis -Continue to hold leflunomide.  7.  Hypertension -BP soft early on and as such ACE inhibitor held in the setting of AKI..    8.  Hypothyroidism -Continue home regimen Synthroid.     DVT prophylaxis: SCDs Code Status: DNR Family Communication: Updated son at bedside. Disposition: Home when clinically improved, improved oral intake, resolution of neutropenia and fever, when cleared by hematology/oncology.  Status is: Inpatient Remains inpatient appropriate because: Severity of illness   Consultants:  Hematology/oncology: Dr. Marin Olp 10/29/2022  Procedures:  Transfuse 2 units packed red blood cells 10/29/2022  Chest x-ray 10/28/2022, 10/31/2022   Antimicrobials:  IV acyclovir 10/29/2022>>> IV Merrem 10/31/2022>>>>>   Subjective: Sitting up in recliner.  Daughter-in-law at bedside.  Patient still with minimal oral intake due to mucositis.  Denies any chest pain or shortness of breath.  Denies any dysuria.  Patient noted to spike fever overnight with a Tmax of 103.1.     Objective: Vitals:   10/31/22 0122 10/31/22 0328 10/31/22 0728 10/31/22 1204  BP: (!) 129/58 (!) 122/57 126/62 (!) 129/57  Pulse: (!) 104 92 87 98  Resp: 17 18 15 14   Temp: (!) 103.1 F (39.5 C) 99.1 F (37.3 C) 98.6 F (37 C) (!) 100.6 F (38.1 C)  TempSrc: Oral Oral Oral Oral  SpO2: 95% 98% 98% 99%  Weight:      Height:        Intake/Output Summary (Last 24 hours) at 10/31/2022 1259 Last data filed at 10/31/2022 1028 Gross per 24 hour  Intake 1478 ml  Output 800 ml  Net 678 ml    Filed Weights   10/28/22 2147  Weight: 47.9 kg    Examination:  General exam: NAD.  Dry mucous membranes.  Respiratory system: CTAB.  No wheezes, no crackles, no rhonchi.  Fair air movement.  Speaking in full sentences.  Cardiovascular system: RRR with 3/6 SEM.  No JVD.  No lower extremity edema. Gastrointestinal system: Abdomen is soft, nontender, nondistended, positive bowel sounds.  No rebound.  No guarding. .  Central nervous system: Alert and oriented. No focal neurological deficits. Extremities: Symmetric 5 x 5 power. Skin: No rashes, lesions or ulcers Psychiatry: Judgement and insight appear normal. Mood & affect appropriate.     Data Reviewed: I have personally reviewed following labs and imaging studies  CBC: Recent Labs  Lab 10/28/22 1630 10/29/22 0538 10/30/22 0444 10/31/22 0438  WBC 2.3* 5.5 3.6* 2.8*  NEUTROABS 0.1*  --  0.1* 0.1*  HGB 8.8* 6.9* 10.3* 10.1*  HCT 28.1* 22.4* 31.7* 31.7*  MCV 83.1 83.9 83.6 84.5  PLT 86* 81* 92* 117*     Basic Metabolic Panel: Recent Labs  Lab 10/28/22 1630 10/29/22 0538 10/30/22 0444 10/31/22 0438  NA 137 135 137 134*  K 3.8 3.6 3.5 4.0  CL 101 104 104 105  CO2 22 23 24 22   GLUCOSE 123* 94 92 97  BUN 53* 44* 29* 17  CREATININE 1.81* 1.25* 0.97 1.07*  CALCIUM 8.8* 8.3* 8.5* 8.1*  MG  --   --  1.7 2.2     GFR: Estimated Creatinine Clearance: 29 mL/min (A) (by C-G formula based on SCr of 1.07 mg/dL (H)).  Liver  Function Tests: Recent Labs  Lab 10/28/22 1630 10/30/22 0444 10/31/22 0438  AST 28 26 34  ALT 26 20 20   ALKPHOS 61 56 50  BILITOT 0.6 0.8 0.9  PROT 7.5 6.3* 5.9*  ALBUMIN 3.6 2.9* 2.8*     CBG: No results for input(s): "GLUCAP" in the last 168 hours.   No results found for this or any previous visit (from the past 240 hour(s)).       Radiology Studies: DG Chest 2 View  Result Date: 10/31/2022 CLINICAL DATA:  Fevers. EXAM: CHEST - 2 VIEW COMPARISON:  10/28/2022 FINDINGS: Stable cardiomediastinal contours. No pleural fluid or airspace disease. No interstitial edema. Status post ACDF within the lower cervical spine. IMPRESSION: No active cardiopulmonary abnormalities. Electronically Signed   By: Kerby Moors M.D.   On: 10/31/2022 10:05        Scheduled Meds:  sodium chloride   Intravenous Once   acetaminophen  650 mg Oral Once   antiseptic oral rinse  15 mL Mouth Rinse Q6H   feeding supplement  237 mL Oral BID BM   filgrastim  480 mcg Subcutaneous Daily  folic acid  1 mg Oral Daily   influenza vaccine adjuvanted  0.5 mL Intramuscular Tomorrow-1000   levothyroxine  75 mcg Oral Q0600   magic mouthwash w/lidocaine  5 mL Oral TID   magic mouthwash  15 mL Oral QID   Continuous Infusions:  sodium chloride 75 mL/hr at 10/31/22 0347   acyclovir (ZOVIRAX) 480 mg in dextrose 5 % 100 mL IVPB 480 mg (10/31/22 0843)   meropenem (MERREM) IV 1 g (10/31/22 1026)     LOS: 2 days    Time spent: 40 minutes    Irine Seal, MD [facility]   To contact the attending provider between 7A-7P or the covering provider during after hours 7P-7A, please log into the web site [email or web address] and access using universal  password for that web site. If you do not have the password, please call the hospital operator.  10/31/2022, 12:59 PM

## 2022-10-31 NOTE — Progress Notes (Signed)
[name] [name] is doing a little bit better.  She did spike a temperature of 103.1 yesterday.  She is neutropenic.  She has not had to be on IV antibiotics at this point.  She will need to have cultures drawn.  She still has the ulcer in the mouth.  She says this may be a little bit better.  She is on Neupogen.  I am somewhat surprised  that her white cell count really not gone up any.  This is certainly somewhat concerning.  Today, her white cell count is 2.8.  Hemoglobin 10.1.  Platelet count 117,000.  At least, her hemoglobin and platelet count are improving.  Her ANC is still 100.  This is quite low.  Again I would have thought that with the Neupogen, we will be getting her white cell count better.  She has had no dysuria.  There is no cough.  Given her age, we have to be aggressive to make sure that she does not get an infection.  We need to make sure we treat any underlying infection.  Her sodium is 134.  Potassium 4.0 BUN 17 creatinine 1.07.  Calcium 8.1 with an albumin of 2.8.  She has had no bleeding.  There is no diarrhea.  She has had not as much dysphagia or odynophagia.   Her vital signs show temperature of 98.6.  Pulse 87.  Blood pressure 126/62.  Her head neck exam shows a ulcer in the right buccal mucosa.  This is about the same.  Her neck is without any adenopathy.  Her lungs sound clear bilaterally.  Cardiac exam regular rate and rhythm.  There are no murmurs.  Abdomen is soft.  Bowel sounds are present.  There is no fluid wave.  There is no palpable liver or spleen tip.  Extremity shows no clubbing, cyanosis or edema.  Skin exam shows no rashes.  Neurological exam is nonfocal.  Again, she spiked a high temperature yesterday.  Will get have to get her on IV antibiotics empirically.  Will have to send off cultures.  I probably would want a chest x-ray on her.  We may have to think about increasing the Neupogen dose.  I will have to see how her white cell count trends tomorrow.  I do  appreciate the great care she is getting from everybody on 4 E.   [name] Haw, MD  [name] 53:5

## 2022-10-31 NOTE — Progress Notes (Signed)
Mobility Specialist - Progress Note   10/31/22 1059  Mobility  Activity Ambulated with assistance in hallway  Level of Assistance Standby assist, set-up cues, supervision of patient - no hands on  Assistive Device Cane  Distance Ambulated (ft) 200 ft  Activity Response Tolerated well  Mobility Referral Yes  $Mobility charge 1 Mobility   Pt received in bed and agreed to mobility, had no c/o pain nor discomfort during session. Pt returned to chair with all needs met.  Roderick Pee Mobility Specialist

## 2022-10-31 NOTE — Progress Notes (Signed)
   10/31/22 0122  Assess: MEWS Score  Temp (!) 103.1 F (39.5 C)  BP (!) 129/58  MAP (mmHg) 78  Pulse Rate (!) 104  Resp 17  SpO2 95 %  O2 Device Room Air  Assess: MEWS Score  MEWS Temp 2  MEWS Systolic 0  MEWS Pulse 1  MEWS RR 0  MEWS LOC 0  MEWS Score 3  MEWS Score Color Yellow  Assess: if the MEWS score is Yellow or Red  Were vital signs taken at a resting state? Yes  Focused Assessment Change from prior assessment (see assessment flowsheet)  Does the patient meet 2 or more of the SIRS criteria? Yes  Does the patient have a confirmed or suspected source of infection? No  MEWS guidelines implemented  Yes, yellow  Treat  MEWS Interventions Considered administering scheduled or prn medications/treatments as ordered  Take Vital Signs  Increase Vital Sign Frequency  Yellow: Q2hr x1, continue Q4hrs until patient remains green for 12hrs  Escalate  MEWS: Escalate Yellow: Discuss with charge nurse and consider notifying provider and/or RRT  Notify: Charge Nurse/RN  Name of Charge Nurse/RN Notified Lauren Malenfant RN  Assess: SIRS CRITERIA  SIRS Temperature  1  SIRS Pulse 1  SIRS Respirations  0  SIRS WBC 0  SIRS Score Sum  2

## 2022-10-31 NOTE — Progress Notes (Addendum)
Pharmacy Antibiotic Note  [name] [name] is a 88 y.o. female admitted on 10/28/2022 with  weakness, ARF on CKD, stomatitis likely 2/2 MTX toxicity .  PMH significant for large granular lymphocytic leukemia, RA prescribed MTX PTA. Pharmacy has been consulted for acyclovir dosing. Pt is now also being ordered meropenem for FN.   Today, 10/31/22 Neutropenic (ANC 0.1, WBC 2.8) Afebrile SCr = 1.07, CrCl 29  mL/min. AKI resolved. NS  @ 75 mL/hr    Plan: Continue  acyclovir to 480 mg (10 mg/kg) IV q12h   mIVF: NS @ 75 mL/hr Meropenem 1 gr IV q12h  Follow renal function  Height: 5\' 1"  (154.9 cm) Weight: 47.9 kg (105 lb 11.2 oz) IBW/kg (Calculated) : 47.8  Temp (24hrs), Avg:100.5 F (38.1 C), Min:98.3 F (36.8 C), Max:103.1 F (39.5 C)  Recent Labs  Lab 10/28/22 1630 10/29/22 0538 10/30/22 0444 10/31/22 0438  WBC 2.3* 5.5 3.6* 2.8*  CREATININE 1.81* 1.25* 0.97 1.07*     Estimated Creatinine Clearance: 29 mL/min (A) (by C-G formula based on SCr of 1.07 mg/dL (H)).    Allergies  Allergen Reactions   Tramadol Nausea Only   Codeine Phosphate Nausea Only    Antimicrobials this admission: acyclovir 3/27 >>  Meropenem  3/29 >>   Dose adjustments this admission: 3/28 Acyclovir 10 mg/kg IV q24h >> 10 mg/kg IV q12h   Royetta Asal, PharmD, BCPS 10/31/2022 8:21 AM

## 2022-10-31 NOTE — TOC CM/SW Note (Signed)
  Transition of Care [facility]) Screening Note   Patient Details  Name: MABREY QUARANTA Date of Birth: 08/10/1934   [facility] CM/SW Contact:    Henrietta Dine, RN Phone Number: 10/31/2022, 9:20 AM    Transition of Care Department [facility]) has reviewed patient and no TOC needs have been identified at this time. We will continue to monitor patient advancement through interdisciplinary progression rounds. If new patient transition needs arise, please place a TOC consult.

## 2022-11-01 DIAGNOSIS — N179 Acute kidney failure, unspecified: Secondary | ICD-10-CM | POA: Diagnosis not present

## 2022-11-01 DIAGNOSIS — E860 Dehydration: Secondary | ICD-10-CM | POA: Diagnosis not present

## 2022-11-01 DIAGNOSIS — C91Z0 Other lymphoid leukemia not having achieved remission: Secondary | ICD-10-CM | POA: Diagnosis not present

## 2022-11-01 DIAGNOSIS — D509 Iron deficiency anemia, unspecified: Secondary | ICD-10-CM | POA: Diagnosis not present

## 2022-11-01 DIAGNOSIS — D61818 Other pancytopenia: Secondary | ICD-10-CM | POA: Diagnosis not present

## 2022-11-01 LAB — CBC WITH DIFFERENTIAL/PLATELET
Abs Immature Granulocytes: 0.03 10*3/uL (ref 0.00–0.07)
Basophils Absolute: 0 10*3/uL (ref 0.0–0.1)
Basophils Relative: 1 %
Eosinophils Absolute: 0 10*3/uL (ref 0.0–0.5)
Eosinophils Relative: 1 %
HCT: 36.3 % (ref 36.0–46.0)
Hemoglobin: 11.7 g/dL — ABNORMAL LOW (ref 12.0–15.0)
Immature Granulocytes: 1 %
Lymphocytes Relative: 83 %
Lymphs Abs: 3.4 10*3/uL (ref 0.7–4.0)
MCH: 27 pg (ref 26.0–34.0)
MCHC: 32.2 g/dL (ref 30.0–36.0)
MCV: 83.6 fL (ref 80.0–100.0)
Monocytes Absolute: 0.5 10*3/uL (ref 0.1–1.0)
Monocytes Relative: 11 %
Neutro Abs: 0.1 10*3/uL — CL (ref 1.7–7.7)
Neutrophils Relative %: 3 %
Platelets: 123 10*3/uL — ABNORMAL LOW (ref 150–400)
RBC: 4.34 MIL/uL (ref 3.87–5.11)
RDW: 21.3 % — ABNORMAL HIGH (ref 11.5–15.5)
WBC: 4 10*3/uL (ref 4.0–10.5)
nRBC: 0 % (ref 0.0–0.2)

## 2022-11-01 LAB — COMPREHENSIVE METABOLIC PANEL
ALT: 23 U/L (ref 0–44)
AST: 39 U/L (ref 15–41)
Albumin: 3 g/dL — ABNORMAL LOW (ref 3.5–5.0)
Alkaline Phosphatase: 66 U/L (ref 38–126)
Anion gap: 9 (ref 5–15)
BUN: 14 mg/dL (ref 8–23)
CO2: 22 mmol/L (ref 22–32)
Calcium: 8.5 mg/dL — ABNORMAL LOW (ref 8.9–10.3)
Chloride: 107 mmol/L (ref 98–111)
Creatinine, Ser: 0.69 mg/dL (ref 0.44–1.00)
GFR, Estimated: >60 mL/min (ref >60)
Glucose, Bld: 82 mg/dL (ref 70–99)
Potassium: 4.2 mmol/L (ref 3.5–5.1)
Sodium: 138 mmol/L (ref 135–145)
Total Bilirubin: 0.7 mg/dL (ref 0.3–1.2)
Total Protein: 6.6 g/dL (ref 6.5–8.1)

## 2022-11-01 LAB — URINE CULTURE: Culture: NO GROWTH

## 2022-11-01 MED ORDER — SODIUM CHLORIDE 0.9 % IV SOLN: INTRAVENOUS | Status: DC

## 2022-11-01 NOTE — Progress Notes (Signed)
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  Patient seen in the ED, noted to be pancytopenic and also noted to be in AKI and subsequently admitted for further evaluation and management.   Assessment & Plan:   Principal Problem:   Acute renal failure superimposed on stage 3a chronic kidney disease (HCC) Active Problems:   Neutropenic fever (HCC)   Hypothyroidism   Essential hypertension   Rheumatoid arthritis (HCC)   Neutropenia (HCC)   Iron deficiency anemia   Large granular lymphocytic leukemia (HCC)   AKI (acute kidney injury) ([place])   Pancytopenia (HCC)   Stomatitis   Pre-diabetes   Dehydration  #1 acute renal failure superimposed on CKD stage IIIa -Likely secondary to prerenal azotemia in the setting of poor oral intake. -Creatinine noted on admission to be 1.81 with previous creatinine at 1.04 on 10/09/2022. -Urinalysis nitrite negative leukocyte negative greater than 500 glucose, negative for protein. -Renal function improved with hydration, creatinine 0.69.   -Continue gentle hydration.   -Supportive care.   2.  Prediabetes -Jardiance on hold with AKI. -Outpatient follow-up.  3.  Mucositis/stomatitis -Felt likely secondary to methotrexate toxicity in the setting of AKI. -Patient still with significant pain and minimal oral intake.  -Continue Magic mouthwash with lidocaine.   4.  Pancytopenia/febrile neutropenia -Patient with history of iron deficiency anemia at baseline, now neutropenic with thrombocytopenia. -Could be secondary to  myelosuppression from methotrexate in the setting of AKI. -Son states gives his mother methotrexate 2.5 mg tablets 3 times weekly to make up for the once a week dose of 6 mg daily. -Patient seen in consultation by oncology and patient started on Neupogen. -Patient initially with no signs or symptoms of infection however noted to have a Tmax of 103.1 the night of 10/30/2022.   -Patient pancultured.   -Fever curve slowly trending down.   -Chest x-ray done negative for any acute infiltrates.   -Urinalysis unremarkable.   -Blood cultures pending.   -Leukocytosis starting to improve on G-CSF.   -Continue IV Merrem.   -Hemoglobin stable at 11.7 from 6.2, status post transfusion 2 units PRBCs 10/29/2022.    5.  Large granular lymphocytic leukemia -Noted to be on methotrexate which is currently on hold due to concerns for myelo suppression of stomatitis. -Oncology following.  6.  Rheumatoid arthritis -Continue to hold leflunomide.  7.  Hypertension -Blood pressure noted to be soft, improving.   -Continue to hold ACE inhibitor.    8.  Hypothyroidism -Synthroid.     DVT prophylaxis: SCDs Code Status: DNR Family Communication: Updated patient.  No family at bedside.  Disposition: Home when clinically improved, improved oral intake, resolution of neutropenia and fever, when cleared by hematology/oncology.  Status is: Inpatient Remains inpatient appropriate because: Severity of illness   Consultants:  Hematology/oncology: Dr. Marin Olp 10/29/2022  Procedures:  Transfuse 2 units packed red blood cells 10/29/2022  Chest x-ray 10/28/2022, 10/31/2022   Antimicrobials:  IV acyclovir 10/29/2022>>> IV Merrem 10/31/2022>>>>>   Subjective: Laying in bed.  Still with mouth pain.  Patient states still with poor oral intake.  No chest pain.  No shortness of breath, no  abdominal pain, no cough, no diarrhea, no dysuria.  Fever curve trending down.  Objective: Vitals:   11/01/22 0014 11/01/22 0422  11/01/22 0744 11/01/22 1211  BP: (!) 123/58 (!) 142/57 (!) 158/57 (!) 139/59  Pulse: 90 84 100 88  Resp: 18 18 18 17   Temp: 98.6 F (37 C) 98.2 F (36.8 C) 100.2 F (37.9 C) 97.9 F (36.6 C)  TempSrc: Oral Oral Oral Oral  SpO2: 100% 100% 98% 100%  Weight:      Height:        Intake/Output Summary (Last 24 hours) at 11/01/2022 1217 Last data filed at 11/01/2022 1118 Gross per 24 hour  Intake 2065.61 ml  Output 1000 ml  Net 1065.61 ml    Filed Weights   10/28/22 2147  Weight: 47.9 kg    Examination:  General exam: NAD.   Respiratory system: Lungs clear to auscultation bilaterally.  No wheezes, no crackles, no rhonchi.  Fair air movement.  Speaking in full sentences.  Cardiovascular system: RRR with 3/6 SEM.  No JVD.  No lower extremity edema. Gastrointestinal system: Abdomen is soft, nontender, nondistended, positive bowel sounds.  No rebound.  No guarding.  Central nervous system: Alert and oriented. No focal neurological deficits. Extremities: Symmetric 5 x 5 power. Skin: No rashes, lesions or ulcers Psychiatry: Judgement and insight appear normal. Mood & affect appropriate.     Data Reviewed: I have personally reviewed following labs and imaging studies  CBC: Recent Labs  Lab 10/28/22 1630 10/29/22 0538 10/30/22 0444 10/31/22 0438 11/01/22 0532  WBC 2.3* 5.5 3.6* 2.8* 4.0  NEUTROABS 0.1*  --  0.1* 0.1* 0.1*  HGB 8.8* 6.9* 10.3* 10.1* 11.7*  HCT 28.1* 22.4* 31.7* 31.7* 36.3  MCV 83.1 83.9 83.6 84.5 83.6  PLT 86* 81* 92* 117* 123*     Basic Metabolic Panel: Recent Labs  Lab 10/28/22 1630 10/29/22 0538 10/30/22 0444 10/31/22 0438 11/01/22 0532  NA 137 135 137 134* 138  K 3.8 3.6 3.5 4.0 4.2  CL 101 104 104 105 107  CO2 22 23 24 22 22   GLUCOSE 123* 94 92 97 82  BUN 53* 44* 29* 17 14  CREATININE 1.81* 1.25* 0.97 1.07* 0.69  CALCIUM 8.8* 8.3* 8.5* 8.1* 8.5*  MG  --   --  1.7 2.2  --      GFR: Estimated Creatinine Clearance: 38.8 mL/min (by C-G  formula based on SCr of 0.69 mg/dL).  Liver Function Tests: Recent Labs  Lab 10/28/22 1630 10/30/22 0444 10/31/22 0438 11/01/22 0532  AST 28 26 34 39  ALT 26 20 20 23   ALKPHOS 61 56 50 66  BILITOT 0.6 0.8 0.9 0.7  PROT 7.5 6.3* 5.9* 6.6  ALBUMIN 3.6 2.9* 2.8* 3.0*     CBG: No results for input(s): "GLUCAP" in the last 168 hours.   Recent Results (from the past 240 hour(s))  Culture, blood (Routine X 2) w Reflex to ID Panel     Status: None (Preliminary result)   Collection Time: 10/31/22  8:41 AM   Specimen: BLOOD  Result Value Ref Range Status   Specimen Description   Final    BLOOD BLOOD RIGHT ARM Performed at [facility] [address] [place], [place] [address]    Special Requests   Final    BOTTLES DRAWN AEROBIC ONLY Blood Culture adequate volume Performed at [facility] [address], [place], [place] [address]    Culture   Final  NO GROWTH < 24 HOURS Performed at [facility] [address] [place], [place] [address]    Report Status PENDING  Incomplete  Culture, blood (Routine X 2) w Reflex to ID Panel     Status: None (Preliminary result)   Collection Time: 10/31/22  8:43 AM   Specimen: BLOOD  Result Value Ref Range Status   Specimen Description   Final    BLOOD BLOOD RIGHT HAND Performed at [facility] [address] [place], [place] [address]    Special Requests   Final    BOTTLES DRAWN AEROBIC ONLY Blood Culture results may not be optimal due to an inadequate volume of blood received in culture bottles Performed at [facility] [address] [place], [place] [address]    Culture   Final    NO GROWTH < 24 HOURS Performed at [facility] [address], [place], [place] [address]    Report Status PENDING  Incomplete  Urine Culture (for pregnant, neutropenic or urologic patients or patients with an indwelling urinary catheter)     Status: None    Collection Time: 10/31/22 10:29 AM   Specimen: Urine, Catheterized  Result Value Ref Range Status   Specimen Description   Final    URINE, CATHETERIZED Performed at [facility] [address], [place], [place] [address]    Special Requests   Final    Immunocompromised Performed at [facility] [place] [address] [place], [place] [address]    Culture   Final    NO GROWTH Performed at [facility], [place] [address], [place], [place] [address]    Report Status 11/01/2022 FINAL  Final         Radiology Studies: DG Chest 2 View  Result Date: 10/31/2022 CLINICAL DATA:  Fevers. EXAM: CHEST - 2 VIEW COMPARISON:  10/28/2022 FINDINGS: Stable cardiomediastinal contours. No pleural fluid or airspace disease. No interstitial edema. Status post ACDF within the lower cervical spine. IMPRESSION: No active cardiopulmonary abnormalities. Electronically Signed   By: Kerby Moors M.D.   On: 10/31/2022 10:05        Scheduled Meds:  sodium chloride   Intravenous Once   acetaminophen  650 mg Oral Once   antiseptic oral rinse  15 mL Mouth Rinse Q6H   feeding supplement  237 mL Oral BID BM   filgrastim  480 mcg Subcutaneous Daily   folic acid  1 mg Oral Daily   influenza vaccine adjuvanted  0.5 mL Intramuscular Tomorrow-1000   levothyroxine  75 mcg Oral Q0600   magic mouthwash w/lidocaine  5 mL Oral TID   magic mouthwash  15 mL Oral QID   polyethylene glycol  17 g Oral Daily   senna-docusate  1 tablet Oral BID   Continuous Infusions:  sodium chloride 50 mL/hr at 11/01/22 [id number]   acyclovir (ZOVIRAX) 480 mg in dextrose 5 % 100 mL IVPB 480 mg (11/01/22 0926)   meropenem (MERREM) IV 1 g (11/01/22 0807)     LOS: 3 days    Time spent: 35 minutes    Irine Seal, MD [facility]   To contact the attending provider between 7A-7P or the covering provider during after hours 7P-7A, please log into the web site [email or web address] and access  using universal  password for that web site. If you do not have the password, please call the hospital operator.  11/01/2022, 12:17 PM

## 2022-11-01 NOTE — Progress Notes (Signed)
Mobility Specialist - Progress Note   11/01/22 1440  Mobility  Activity Ambulated with assistance in hallway  Level of Assistance Standby assist, set-up cues, supervision of patient - no hands on  Assistive Device Cane  Distance Ambulated (ft) 250 ft  Activity Response Tolerated well  Mobility Referral Yes  $Mobility charge 1 Mobility   Pt received in bed and agreeable to mobility. No complaints during session. Pt to bed after session with all needs met.     [facility]

## 2022-11-01 NOTE — Progress Notes (Signed)
[name] still has little bit of a temperature yesterday.  I think that the Tmax was 101.2.  She had a chest x-ray which was negative for any obvious infiltrate.  She has had cultures that were drawn which are negative today.  Her urine looks okay.  She is on IV antibiotics with Merrem.  She still has issues with the ulcer on the right side of her mouth.  She is eating but I think that this ulcer still bothers her a little bit.  I know she is on mouth rinses.  She is on Biotene.  I do not have back her CBC yet today.  She is on Neupogen.  She has had no diarrhea.  She has had no nausea or vomiting.  There has been no bleeding.  She has had no rashes.  Her vital signs show temperature of 98.2.  Pulse 84.  Blood pressure 142/57.  Her head and neck exam shows the oral ulcer on the right buccal mucosa.  This might be a little bit better.  Her lungs are clear bilaterally.  She has good air movement bilaterally.  Cardiac exam regular rate and rhythm.  Abdomen is soft.  Bowel sounds are present.  There is no fluid wave.  There is no palpable liver or spleen tip.  Extremity shows no clubbing, cyanosis or edema.  Skin exam shows no rashes, ecchymosis or petechia.  Neurological exam is nonfocal.  [name] is a very charming 88 year old white female.  She has LGL in association with her rheumatoid arthritis.  She has been on methotrexate.  She came in with pancytopenia.  We have stopped the methotrexate.  She has this oral ulcer.  She is still neutropenic.  Again we need to see what her blood count is.  She will continue on the Neupogen.  She will continue on Merrem.  Hopefully, we can see her white cell count start coming up now that she is off methotrexate.  She will may take several days before we see this.  I know that she is getting fantastic care from everybody upon 4 E.  I do appreciate everybody's effort.  Lattie Haw, MD  [facility] 28:5

## 2022-11-01 NOTE — Plan of Care (Signed)
  Problem: Education: Goal: Knowledge of General Education information will improve Description: Including pain rating scale, medication(s)/side effects and non-pharmacologic comfort measures Outcome: Progressing   Problem: Clinical Measurements: Goal: Ability to maintain clinical measurements within normal limits will improve Outcome: Progressing   Problem: Activity: Goal: Risk for activity intolerance will decrease Outcome: Progressing   Problem: Health Behavior/Discharge Planning: Goal: Ability to manage health-related needs will improve Outcome: Not Progressing   Problem: Nutrition: Goal: Adequate nutrition will be maintained Outcome: Not Progressing

## 2022-11-01 NOTE — Plan of Care (Signed)

## 2022-11-02 DIAGNOSIS — E860 Dehydration: Secondary | ICD-10-CM | POA: Diagnosis not present

## 2022-11-02 DIAGNOSIS — D509 Iron deficiency anemia, unspecified: Secondary | ICD-10-CM | POA: Diagnosis not present

## 2022-11-02 DIAGNOSIS — N179 Acute kidney failure, unspecified: Secondary | ICD-10-CM | POA: Diagnosis not present

## 2022-11-02 DIAGNOSIS — C91Z0 Other lymphoid leukemia not having achieved remission: Secondary | ICD-10-CM | POA: Diagnosis not present

## 2022-11-02 LAB — COMPREHENSIVE METABOLIC PANEL
ALT: 19 U/L (ref 0–44)
AST: 29 U/L (ref 15–41)
Albumin: 2.6 g/dL — ABNORMAL LOW (ref 3.5–5.0)
Alkaline Phosphatase: 56 U/L (ref 38–126)
Anion gap: 6 (ref 5–15)
BUN: 14 mg/dL (ref 8–23)
CO2: 23 mmol/L (ref 22–32)
Calcium: 8.1 mg/dL — ABNORMAL LOW (ref 8.9–10.3)
Chloride: 108 mmol/L (ref 98–111)
Creatinine, Ser: 0.77 mg/dL (ref 0.44–1.00)
GFR, Estimated: >60 mL/min (ref >60)
Glucose, Bld: 92 mg/dL (ref 70–99)
Potassium: 4.3 mmol/L (ref 3.5–5.1)
Sodium: 137 mmol/L (ref 135–145)
Total Bilirubin: 0.7 mg/dL (ref 0.3–1.2)
Total Protein: 5.4 g/dL — ABNORMAL LOW (ref 6.5–8.1)

## 2022-11-02 LAB — CBC WITH DIFFERENTIAL/PLATELET
Abs Immature Granulocytes: 0 10*3/uL (ref 0.00–0.07)
Basophils Absolute: 0 10*3/uL (ref 0.0–0.1)
Basophils Relative: 1 %
Eosinophils Absolute: 0 10*3/uL (ref 0.0–0.5)
Eosinophils Relative: 1 %
HCT: 31.2 % — ABNORMAL LOW (ref 36.0–46.0)
Hemoglobin: 9.8 g/dL — ABNORMAL LOW (ref 12.0–15.0)
Immature Granulocytes: 0 %
Lymphocytes Relative: 74 %
Lymphs Abs: 2.3 10*3/uL (ref 0.7–4.0)
MCH: 27.1 pg (ref 26.0–34.0)
MCHC: 31.4 g/dL (ref 30.0–36.0)
MCV: 86.4 fL (ref 80.0–100.0)
Monocytes Absolute: 0.6 10*3/uL (ref 0.1–1.0)
Monocytes Relative: 21 %
Neutro Abs: 0.1 10*3/uL — CL (ref 1.7–7.7)
Neutrophils Relative %: 3 %
Platelets: 129 10*3/uL — ABNORMAL LOW (ref 150–400)
RBC: 3.61 MIL/uL — ABNORMAL LOW (ref 3.87–5.11)
RDW: 21.2 % — ABNORMAL HIGH (ref 11.5–15.5)
WBC: 3.1 10*3/uL — ABNORMAL LOW (ref 4.0–10.5)
nRBC: 0 % (ref 0.0–0.2)

## 2022-11-02 LAB — MAGNESIUM: Magnesium: 1.8 mg/dL (ref 1.7–2.4)

## 2022-11-02 MED ORDER — MAGNESIUM SULFATE 2 GM/50ML IV SOLN
2.0000 g | Freq: Once | INTRAVENOUS | Status: AC
  Administered 2022-11-02: 2 g via INTRAVENOUS
  Filled 2022-11-02: qty 50

## 2022-11-02 MED ORDER — BISACODYL 10 MG RE SUPP
10.0000 mg | Freq: Once | RECTAL | Status: AC
  Administered 2022-11-02: 10 mg via RECTAL
  Filled 2022-11-02: qty 1

## 2022-11-02 MED ORDER — SODIUM CHLORIDE 0.9 % IV SOLN: INTRAVENOUS | Status: DC

## 2022-11-02 NOTE — Plan of Care (Signed)
  Problem: Education: Goal: Knowledge of General Education information will improve Description: Including pain rating scale, medication(s)/side effects and non-pharmacologic comfort measures Outcome: Progressing   Problem: Health Behavior/Discharge Planning: Goal: Ability to manage health-related needs will improve Outcome: Progressing   Problem: Clinical Measurements: Goal: Diagnostic test results will improve Outcome: Progressing Goal: Respiratory complications will improve Outcome: Progressing Goal: Cardiovascular complication will be avoided Outcome: Progressing   Problem: Activity: Goal: Risk for activity intolerance will decrease Outcome: Progressing   Problem: Nutrition: Goal: Adequate nutrition will be maintained Outcome: Progressing   

## 2022-11-02 NOTE — Progress Notes (Signed)
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  Patient seen in the ED, noted to be pancytopenic and also noted to be in AKI and subsequently admitted for further evaluation and management.   Assessment & Plan:   Principal Problem:   Acute renal failure superimposed on stage 3a chronic kidney disease (HCC) Active Problems:   Neutropenic fever (HCC)   Hypothyroidism   Essential hypertension   Rheumatoid arthritis (HCC)   Neutropenia (HCC)   Iron deficiency anemia   Large granular lymphocytic leukemia (HCC)   AKI (acute kidney injury) ([place])   Pancytopenia (HCC)   Stomatitis   Pre-diabetes   Dehydration  #1 acute renal failure superimposed on CKD stage IIIa -Likely secondary to prerenal azotemia in the setting of poor oral intake. -Creatinine noted on admission to be 1.81 with previous creatinine at 1.04 on 10/09/2022. -Urinalysis nitrite negative leukocyte negative greater than 500 glucose, negative for protein. -Renal function improved with hydration. -Creatinine currently at 0.77. -Continue gentle hydration.   -Supportive care.   2.  Prediabetes -Jardiance on hold with AKI. -Outpatient follow-up.  3.  Mucositis/stomatitis -Felt likely secondary to methotrexate toxicity in the setting of AKI. -Patient still with significant pain and minimal oral intake.  -Continue Magic mouthwash with lidocaine.  -Supportive care.  4.  Pancytopenia/febrile neutropenia -Patient with history of iron deficiency anemia at baseline, now neutropenic with thrombocytopenia. -Could  be secondary to myelosuppression from methotrexate in the setting of AKI. -Son states gives his mother methotrexate 2.5 mg tablets 3 times weekly to make up for the once a week dose of 6 mg daily. -Patient seen in consultation by oncology and patient started on Neupogen. -Patient initially with no signs or symptoms of infection however noted to have a Tmax of 103.1 the night of 10/30/2022.  -Fever curve trending down. -Patient pancultured with results pending..   -Chest x-ray done negative for any acute infiltrates.   -Urinalysis unremarkable.   -Blood cultures pending.   -Leukocytosis starting to improve on G-CSF.   -Continue IV Merrem.   -Hemoglobin stable at 9.8 from 6.2, status post transfusion 2 units PRBCs 10/29/2022.    5.  Large granular lymphocytic leukemia -Noted to be on methotrexate which is currently on hold due to concerns for myelo suppression of stomatitis. -Oncology following.  6.  Rheumatoid arthritis -Continue to hold leflunomide.  7.  Hypertension -Blood pressure noted to be soft initially on presentation however improving. -Continue to hold antihypertensive medication of ACE inhibitor.  8.  Hypothyroidism -Continue Synthroid.      DVT prophylaxis: SCDs Code Status: DNR Family Communication: Updated patient.  No family at bedside.  Disposition: Home when clinically improved, improved oral intake, resolution of neutropenia and fever, when cleared by hematology/oncology.  Status is: Inpatient Remains inpatient appropriate because: Severity of illness   Consultants:  Hematology/oncology: Dr. Marin Olp 10/29/2022  Procedures:  Transfuse 2 units packed red blood cells 10/29/2022  Chest x-ray 10/28/2022, 10/31/2022   Antimicrobials:  IV acyclovir 10/29/2022>>> IV Merrem 10/31/2022>>>>>   Subjective: Laying in bed.  States does not feel as well as she did yesterday.  Still with mild pain.  Still with poor oral intake.  No chest pain.  No abdominal pain.  States  she feels a little queasy today but not sick.  No emesis.  Fever curve trending down.   Objective: Vitals:   11/01/22 1211 11/01/22 1623 11/01/22 1935 11/02/22 0521  BP: (!) 139/59 (!) 134/57 (!) 140/54 (!) 157/70  Pulse: 88 91 89 93  Resp: 17 16 16 16   Temp: 97.9 F (36.6 C) 98.4 F (36.9 C) 97.9 F (36.6 C) 98 F (36.7 C)  TempSrc: Oral Oral Oral Oral  SpO2: 100% 98% 98% 96%  Weight:      Height:        Intake/Output Summary (Last 24 hours) at 11/02/2022 1116 Last data filed at 11/02/2022 0943 Gross per 24 hour  Intake 1270.8 ml  Output 600 ml  Net 670.8 ml    Filed Weights   10/28/22 2147  Weight: 47.9 kg    Examination:  General exam: Frail. Respiratory system: CTAB.  No wheezes, no crackles, no rhonchi.  Fair air movement.  Speaking in full sentences. Cardiovascular system: RRR with 3/6 SEM.  No JVD.  No lower extremity edema.   Gastrointestinal system: Abdomen is soft, nontender, nondistended, positive bowel sounds.  No rebound.  No guarding.  Central nervous system: Alert and oriented. No focal neurological deficits. Extremities: Symmetric 5 x 5 power. Skin: No rashes, lesions or ulcers Psychiatry: Judgement and insight appear normal. Mood & affect appropriate.     Data Reviewed: I have personally reviewed following labs and imaging studies  CBC: Recent Labs  Lab 10/28/22 1630 10/29/22 0538 10/30/22 0444 10/31/22 0438 11/01/22 0532 11/02/22 0438  WBC 2.3* 5.5 3.6* 2.8* 4.0 3.1*  NEUTROABS 0.1*  --  0.1* 0.1* 0.1* 0.1*  HGB 8.8* 6.9* 10.3* 10.1* 11.7* 9.8*  HCT 28.1* 22.4* 31.7* 31.7* 36.3 31.2*  MCV 83.1 83.9 83.6 84.5 83.6 86.4  PLT 86* 81* 92* 117* 123* 129*     Basic Metabolic Panel: Recent Labs  Lab 10/29/22 0538 10/30/22 0444 10/31/22 0438 11/01/22 0532 11/02/22 0438  NA 135 137 134* 138 137  K 3.6 3.5 4.0 4.2 4.3  CL 104 104 105 107 108  CO2 23 24 22 22 23   GLUCOSE 94 92 97 82 92  BUN 44* 29* 17 14 14   CREATININE 1.25* 0.97  1.07* 0.69 0.77  CALCIUM 8.3* 8.5* 8.1* 8.5* 8.1*  MG  --  1.7 2.2  --  1.8     GFR: Estimated Creatinine Clearance: 38.8 mL/min (by C-G formula based on SCr of 0.77 mg/dL).  Liver Function Tests: Recent Labs  Lab 10/28/22 1630 10/30/22 0444 10/31/22 0438 11/01/22 0532 11/02/22 0438  AST 28 26 34 39 29  ALT 26 20 20 23 19   ALKPHOS 61 56 50 66 56  BILITOT 0.6 0.8 0.9 0.7 0.7  PROT 7.5 6.3* 5.9* 6.6 5.4*  ALBUMIN 3.6 2.9* 2.8* 3.0* 2.6*     CBG: No results for input(s): "GLUCAP" in the last 168 hours.   Recent Results (from the past 240 hour(s))  Culture, blood (Routine X 2) w Reflex to ID Panel     Status: None (Preliminary result)   Collection Time: 10/31/22  8:41 AM   Specimen: BLOOD  Result Value Ref Range Status   Specimen Description   Final    BLOOD BLOOD RIGHT ARM Performed at [facility] [address], [place], [place] [address]    Special Requests   Final    BOTTLES  DRAWN AEROBIC ONLY Blood Culture adequate volume Performed at [facility] [address], [place], [place] [address]    Culture   Final    NO GROWTH 2 DAYS Performed at [facility] [address] [place], [place] [address]    Report Status PENDING  Incomplete  Culture, blood (Routine X 2) w Reflex to ID Panel     Status: None (Preliminary result)   Collection Time: 10/31/22  8:43 AM   Specimen: BLOOD  Result Value Ref Range Status   Specimen Description   Final    BLOOD BLOOD RIGHT HAND Performed at [facility] [address], [place], [place] [address]    Special Requests   Final    BOTTLES DRAWN AEROBIC ONLY Blood Culture results may not be optimal due to an inadequate volume of blood received in culture bottles Performed at [facility] [address] [place], [place] [address]    Culture   Final    NO GROWTH 2 DAYS Performed at [facility] [address], [place], [place]  [address]    Report Status PENDING  Incomplete  Urine Culture (for pregnant, neutropenic or urologic patients or patients with an indwelling urinary catheter)     Status: None   Collection Time: 10/31/22 10:29 AM   Specimen: Urine, Catheterized  Result Value Ref Range Status   Specimen Description   Final    URINE, CATHETERIZED Performed at [facility] [address], [place], [place] [address]    Special Requests   Final    Immunocompromised Performed at [facility], [place] [address] [place], [place] [address]    Culture   Final    NO GROWTH Performed at [facility], [place] [address], [place], [place] [address]    Report Status 11/01/2022 FINAL  Final         Radiology Studies: No results found.      Scheduled Meds:  acetaminophen  650 mg Oral Once   antiseptic oral rinse  15 mL Mouth Rinse Q6H   feeding supplement  237 mL Oral BID BM   filgrastim  480 mcg Subcutaneous Daily   folic acid  1 mg Oral Daily   influenza vaccine adjuvanted  0.5 mL Intramuscular Tomorrow-1000   levothyroxine  75 mcg Oral Q0600   magic mouthwash w/lidocaine  5 mL Oral TID   magic mouthwash  15 mL Oral QID   polyethylene glycol  17 g Oral Daily   senna-docusate  1 tablet Oral BID   Continuous Infusions:  sodium chloride 50 mL/hr at 11/02/22 0700   acyclovir (ZOVIRAX) 480 mg in dextrose 5 % 100 mL IVPB Stopped (11/01/22 2221)   meropenem (MERREM) IV 1 g (11/02/22 1049)     LOS: 4 days    Time spent: 35 minutes    Irine Seal, MD [facility]   To contact the attending provider between 7A-7P or the covering provider during after hours 7P-7A, please log into the web site [email or web address] and access using universal [place] password for that web site. If you do not have the password, please call the hospital operator.  11/02/2022, 11:16 AM

## 2022-11-02 NOTE — Evaluation (Signed)
Occupational Therapy Evaluation Patient Details Name: [name] [name] MRN: [id number] DOB: 01-31-1935 Today's Date: 11/02/2022   History of Present Illness Patient is a 88 year old female who presented with oral ulcers and decreased oral intake.patient was admitted with acute renal failure, neutropenic fever,and mucositis. 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   Clinical Impression   Patient is a 88 year old female who was admitted for above. Patient was living at home alone with PRN support  from family and neighbors. Patient currently is min guard supervision for ADLs with use of cane with decreased functional activity tolerance.  Patient would continue to benefit from skilled OT services at this time while admitted  to address noted deficits in order to improve overall safety and independence in ADLs.       Recommendations for follow up therapy are one component of a multi-disciplinary discharge planning process, led by the attending physician.  Recommendations may be updated based on patient status, additional functional criteria and insurance authorization.   Assistance Recommended at Discharge Intermittent Supervision/Assistance  Patient can return home with the following Assistance with cooking/housework;Assist for transportation;Direct supervision/assist for financial management;Help with stairs or ramp for entrance;Direct supervision/assist for medications management;A little help with bathing/dressing/bathroom    Functional Status Assessment  Patient has had a recent decline in their functional status and demonstrates the ability to make significant improvements in function in a reasonable and predictable amount of time.  Equipment Recommendations  None recommended by OT       Precautions / Restrictions Precautions Precautions: Fall Restrictions Weight Bearing Restrictions: No      Mobility Bed Mobility Overal bed mobility: Needs Assistance Bed  Mobility: Supine to Sit     Supine to sit: Supervision     General bed mobility comments: with HOB raised.          Balance Overall balance assessment: Needs assistance Sitting-balance support: Feet supported Sitting balance-Leahy Scale: Fair     Standing balance support: No upper extremity supported Standing balance-Leahy Scale: Fair           ADL either performed or assessed with clinical judgement   ADL Overall ADL's : Needs assistance/impaired   Eating/Feeding Details (indicate cue type and reason): patient not eating or drinking with pain in mouth at a 9 3/4 per patient report. Grooming: Dance movement psychotherapist;Wash/dry hands;Brushing hair;Standing;Supervision/safety Grooming Details (indicate cue type and reason): at sink with cane almost left behind on return trip to room. Upper Body Bathing: Set up;Sitting   Lower Body Bathing: Minimal assistance;Sitting/lateral leans;Sit to/from stand   Upper Body Dressing : Set up;Sitting   Lower Body Dressing: Minimal assistance;Sitting/lateral leans;Sit to/from stand   Toilet Transfer: Copy Details (indicate cue type and reason): with cane Toileting- Clothing Manipulation and Hygiene: Min guard Toileting - Clothing Manipulation Details (indicate cue type and reason): some instability noted uses cane during this session. patient does not use cane at home only out to get mail             Vision Baseline Vision/History: 1 Wears glasses Vision Assessment?: No apparent visual deficits            Pertinent Vitals/Pain Pain Assessment Pain Assessment: 0-10 Pain Score: 9  (9 3/4) Pain Location: mouth Pain Descriptors / Indicators: Grimacing Pain Intervention(s): Limited activity within patient's tolerance, Monitored during session        Extremity/Trunk Assessment Upper Extremity Assessment Upper Extremity Assessment: Defer to OT evaluation;Overall [facility] for tasks assessed   Lower  Extremity  Assessment Lower Extremity Assessment: Overall WFL for tasks assessed   Cervical / Trunk Assessment Cervical / Trunk Assessment: Normal   Communication Communication Communication: No difficulties   Cognition Arousal/Alertness: Awake/alert Behavior During Therapy: WFL for tasks assessed/performed Overall Cognitive Status: Within Functional Limits for tasks assessed           General Comments: family present in room as well. plesant and cooperative                Home Living Family/patient expects to be discharged to:: Private residence Living Arrangements: Alone Available Help at Discharge: Family;Available PRN/intermittently Type of Home: House Home Access: Level entry     Home Layout: One level     Bathroom Shower/Tub: Tub/shower unit         Home Equipment: Grab bars - tub/shower;Cane - single point   Additional Comments: pt dtr-in-law states she may go to their house for a few days      Prior Functioning/Environment Prior Level of Function : Independent/Modified Independent             Mobility Comments: carries SPC with her but doesn't use it- more for security ADLs Comments: drives/dresses/bathes on an independent basis        OT Problem List: Decreased activity tolerance;Impaired balance (sitting and/or standing);Decreased safety awareness;Decreased knowledge of use of DME or AE      OT Treatment/Interventions: Self-care/ADL training;Energy conservation;Therapeutic exercise;DME and/or AE instruction;Patient/family education;Therapeutic activities;Balance training    OT Goals(Current goals can be found in the care plan section) Acute Rehab OT Goals Patient Stated Goal: to go home OT Goal Formulation: With patient Time For Goal Achievement: 11/16/22 Potential to Achieve Goals: Fair  OT Frequency: Min 1X/week       AM-PAC OT "6 Clicks" Daily Activity     Outcome Measure Help from another person eating meals?: None (declined to eat but  could complete motions to complete self feeding tasks) Help from another person taking care of personal grooming?: None Help from another person toileting, which includes using toliet, bedpan, or urinal?: A Little Help from another person bathing (including washing, rinsing, drying)?: A Little Help from another person to put on and taking off regular upper body clothing?: A Little Help from another person to put on and taking off regular lower body clothing?: A Little 6 Click Score: 20   End of Session Equipment Utilized During Treatment: Other (comment) (cane) Nurse Communication: Other (comment) (ok to participate in session)  Activity Tolerance: Patient tolerated treatment well Patient left: in bed;with call bell/phone within reach (with PT)  OT Visit Diagnosis: Unsteadiness on feet (R26.81);Other abnormalities of gait and mobility (R26.89)                Time: [id number] OT Time Calculation (min): 12 min Charges:  OT General Charges $OT Visit: 1 Visit OT Evaluation $OT Eval Low Complexity: 1 Low  Ankit Degregorio OTR/L, MS Acute Rehabilitation Department Office# [phone]   Willa Rough 11/02/2022, 4:03 PM

## 2022-11-02 NOTE — Plan of Care (Signed)

## 2022-11-02 NOTE — Evaluation (Signed)
Physical Therapy Evaluation Patient Details Name: [name] [name] MRN: [id number] DOB: [birth year] Today's Date: 11/02/2022  History of Present Illness  Patient is a 88 year old female who presented with oral ulcers and decreased oral intake.patient was admitted with acute renal failure, neutropenic fever,and mucositis. 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  Clinical Impression  Pt admitted with above diagnosis.  Pt  presents weaker than her baseline, unsteady gait with LOB x2 during PT session. Pt would likely benefit from continued therapy to work on strengthening  and higher level balance at d/c    Pt currently with functional limitations due to the deficits listed below (see PT Problem List). Pt will benefit from acute skilled PT to increase their independence and safety with mobility to allow discharge.          Recommendations for follow up therapy are one component of a multi-disciplinary discharge planning process, led by the attending physician.  Recommendations may be updated based on patient status, additional functional criteria and insurance authorization.  Follow Up Recommendations       Assistance Recommended at Discharge Intermittent Supervision/Assistance  Patient can return home with the following  A little help with walking and/or transfers;A little help with bathing/dressing/bathroom;Help with stairs or ramp for entrance;Assist for transportation;Assistance with cooking/housework    Equipment Recommendations None recommended by PT  Recommendations for Other Services       Functional Status Assessment Patient has had a recent decline in their functional status and demonstrates the ability to make significant improvements in function in a reasonable and predictable amount of time.     Precautions / Restrictions Precautions Precautions: Fall Restrictions Weight Bearing Restrictions: No      Mobility  Bed Mobility                General bed mobility comments: EOB on arrival    Transfers Overall transfer level: Needs assistance Equipment used: Straight cane, None Transfers: Sit to/from Stand Sit to Stand: Min guard, Supervision           General transfer comment: STS x2, incr time and effort. min/guard for safety    Ambulation/Gait Ambulation/Gait assistance: Min guard Gait Distance (Feet):  (15' more) Assistive device: Straight cane, None Gait Pattern/deviations: Step-through pattern, Narrow base of support       General Gait Details: uses cane part of time, furniture surfs and lifts cane from floor at times. unsteady gait with LOB x2 and min assist for recovery  Stairs            Wheelchair Mobility    Modified Rankin (Stroke Patients Only)       Balance Overall balance assessment: Needs assistance         Standing balance support: No upper extremity supported Standing balance-Leahy Scale: Fair               High level balance activites: Turns, Backward walking High Level Balance Comments: min assist for balance with higher level tasks; able to stand with unilateral UE support and lift one leg at a time to adjust socks with min-guard;  unable to perform without UE support             Pertinent Vitals/Pain Pain Assessment Pain Assessment: 0-10 Pain Score: 9  Pain Location: mouth Pain Descriptors / Indicators: Grimacing Pain Intervention(s): Monitored during session    Home Living Family/patient expects to be discharged to:: Private residence Living Arrangements: Alone Available Help at Discharge: Family;Available PRN/intermittently Type of Home: House  Home Access: Level entry       Home Layout: One level Home Equipment: Grab bars - tub/shower;Cane - single point Additional Comments: pt dtr-in-law states she may go to their house for a few days    Prior Function Prior Level of Function : Independent/Modified Independent             Mobility Comments:  carries SPC with her but doesn't use it- more for security ADLs Comments: drives/dresses/bathes on an independent basis     Hand Dominance        Extremity/Trunk Assessment   Upper Extremity Assessment Upper Extremity Assessment: Defer to OT evaluation;Overall [facility] for tasks assessed    Lower Extremity Assessment Lower Extremity Assessment: Overall WFL for tasks assessed    Cervical / Trunk Assessment Cervical / Trunk Assessment: Normal  Communication   Communication: No difficulties  Cognition Arousal/Alertness: Awake/alert Behavior During Therapy: WFL for tasks assessed/performed                                   General Comments: family present in room as well. plesant and cooperative        General Comments      Exercises     Assessment/Plan    PT Assessment Patient needs continued PT services  PT Problem List Decreased activity tolerance;Decreased balance;Decreased mobility       PT Treatment Interventions DME instruction;Therapeutic exercise;Gait training;Functional mobility training;Therapeutic activities;Patient/family education;Balance training    PT Goals (Current goals can be found in the Care Plan section)  Acute Rehab PT Goals Patient Stated Goal: get better PT Goal Formulation: With patient Time For Goal Achievement: 11/16/22 Potential to Achieve Goals: Good    Frequency Min 3X/week     Co-evaluation               AM-PAC PT "6 Clicks" Mobility  Outcome Measure Help needed turning from your back to your side while in a flat bed without using bedrails?: A Little Help needed moving from lying on your back to sitting on the side of a flat bed without using bedrails?: A Little Help needed moving to and from a bed to a chair (including a wheelchair)?: A Little Help needed standing up from a chair using your arms (e.g., wheelchair or bedside chair)?: A Little Help needed to walk in hospital room?: A Little Help needed  climbing 3-5 steps with a railing? : A Little 6 Click Score: 18    End of Session Equipment Utilized During Treatment: Gait belt Activity Tolerance: Patient tolerated treatment well Patient left: with call bell/phone within reach;with family/visitor present;Other (comment) (bathroom) Nurse Communication: Mobility status PT Visit Diagnosis: Other abnormalities of gait and mobility (R26.89)    Time: [id number] PT Time Calculation (min) (ACUTE ONLY): 13 min   Charges:   PT Evaluation $PT Eval Low Complexity: [place], PT  Acute Rehab Dept [facility]) [phone]  11/02/2022   [facility] 11/02/2022, 3:58 PM

## 2022-11-03 ENCOUNTER — Inpatient Hospital Stay (HOSPITAL_COMMUNITY): Payer: Medicare HMO

## 2022-11-03 DIAGNOSIS — N179 Acute kidney failure, unspecified: Secondary | ICD-10-CM | POA: Diagnosis not present

## 2022-11-03 DIAGNOSIS — R5081 Fever presenting with conditions classified elsewhere: Secondary | ICD-10-CM | POA: Diagnosis not present

## 2022-11-03 DIAGNOSIS — D509 Iron deficiency anemia, unspecified: Secondary | ICD-10-CM | POA: Diagnosis not present

## 2022-11-03 DIAGNOSIS — D61818 Other pancytopenia: Secondary | ICD-10-CM | POA: Diagnosis not present

## 2022-11-03 DIAGNOSIS — K120 Recurrent oral aphthae: Secondary | ICD-10-CM | POA: Diagnosis not present

## 2022-11-03 DIAGNOSIS — D709 Neutropenia, unspecified: Secondary | ICD-10-CM | POA: Diagnosis not present

## 2022-11-03 DIAGNOSIS — E860 Dehydration: Secondary | ICD-10-CM | POA: Diagnosis not present

## 2022-11-03 DIAGNOSIS — C91Z0 Other lymphoid leukemia not having achieved remission: Secondary | ICD-10-CM | POA: Diagnosis not present

## 2022-11-03 LAB — RESPIRATORY PANEL BY PCR

## 2022-11-03 LAB — COMPREHENSIVE METABOLIC PANEL
ALT: 17 U/L (ref 0–44)
AST: 31 U/L (ref 15–41)
Albumin: 2.6 g/dL — ABNORMAL LOW (ref 3.5–5.0)
Alkaline Phosphatase: 63 U/L (ref 38–126)
Anion gap: 6 (ref 5–15)
BUN: 12 mg/dL (ref 8–23)
CO2: 22 mmol/L (ref 22–32)
Calcium: 8.1 mg/dL — ABNORMAL LOW (ref 8.9–10.3)
Chloride: 107 mmol/L (ref 98–111)
Creatinine, Ser: 0.92 mg/dL (ref 0.44–1.00)
GFR, Estimated: >60 mL/min (ref >60)
Glucose, Bld: 83 mg/dL (ref 70–99)
Potassium: 3.9 mmol/L (ref 3.5–5.1)
Sodium: 135 mmol/L (ref 135–145)
Total Bilirubin: 0.8 mg/dL (ref 0.3–1.2)
Total Protein: 5.5 g/dL — ABNORMAL LOW (ref 6.5–8.1)

## 2022-11-03 LAB — CBC WITH DIFFERENTIAL/PLATELET
Abs Immature Granulocytes: 0.01 10*3/uL (ref 0.00–0.07)
Basophils Absolute: 0 10*3/uL (ref 0.0–0.1)
Basophils Relative: 1 %
Eosinophils Absolute: 0 10*3/uL (ref 0.0–0.5)
Eosinophils Relative: 0 %
HCT: 31.1 % — ABNORMAL LOW (ref 36.0–46.0)
Hemoglobin: 10 g/dL — ABNORMAL LOW (ref 12.0–15.0)
Immature Granulocytes: 0 %
Lymphocytes Relative: 66 %
Lymphs Abs: 1.7 10*3/uL (ref 0.7–4.0)
MCH: 27.1 pg (ref 26.0–34.0)
MCHC: 32.2 g/dL (ref 30.0–36.0)
MCV: 84.3 fL (ref 80.0–100.0)
Monocytes Absolute: 0.8 10*3/uL (ref 0.1–1.0)
Monocytes Relative: 29 %
Neutro Abs: 0.1 10*3/uL — CL (ref 1.7–7.7)
Neutrophils Relative %: 4 %
Platelets: 147 10*3/uL — ABNORMAL LOW (ref 150–400)
RBC: 3.69 MIL/uL — ABNORMAL LOW (ref 3.87–5.11)
RDW: 21.5 % — ABNORMAL HIGH (ref 11.5–15.5)
WBC: 2.6 10*3/uL — ABNORMAL LOW (ref 4.0–10.5)
nRBC: 0 % (ref 0.0–0.2)

## 2022-11-03 LAB — CRYPTOCOCCAL ANTIGEN: Crypto Ag: NEGATIVE

## 2022-11-03 LAB — MAGNESIUM: Magnesium: 2 mg/dL (ref 1.7–2.4)

## 2022-11-03 LAB — LACTATE DEHYDROGENASE: LDH: 396 U/L — ABNORMAL HIGH (ref 98–192)

## 2022-11-03 MED ORDER — SARGRAMOSTIM 250 MCG IJ SOLR
500.0000 ug | Freq: Every day | INTRAMUSCULAR | Status: DC
  Administered 2022-11-03 – 2022-11-06 (×4): 500 ug via SUBCUTANEOUS
  Filled 2022-11-03 (×5): qty 2

## 2022-11-03 MED ORDER — IOHEXOL 9 MG/ML PO SOLN
500.0000 mL | ORAL | Status: AC
  Administered 2022-11-03 (×2): 500 mL via ORAL

## 2022-11-03 MED ORDER — MAGIC MOUTHWASH W/LIDOCAINE
10.0000 mL | Freq: Four times a day (QID) | ORAL | Status: DC
  Administered 2022-11-03 – 2022-11-07 (×15): 10 mL via ORAL
  Filled 2022-11-03 (×19): qty 10

## 2022-11-03 MED ORDER — VANCOMYCIN HCL 500 MG/100ML IV SOLN
500.0000 mg | INTRAVENOUS | Status: DC
  Administered 2022-11-04: 500 mg via INTRAVENOUS
  Filled 2022-11-03 (×2): qty 100

## 2022-11-03 MED ORDER — VALACYCLOVIR HCL 500 MG PO TABS
1000.0000 mg | ORAL_TABLET | Freq: Two times a day (BID) | ORAL | Status: DC
  Administered 2022-11-03 – 2022-11-04 (×3): 1000 mg via ORAL
  Filled 2022-11-03 (×4): qty 2

## 2022-11-03 MED ORDER — VANCOMYCIN HCL IN DEXTROSE 1-5 GM/200ML-% IV SOLN
1000.0000 mg | Freq: Once | INTRAVENOUS | Status: AC
  Administered 2022-11-03: 1000 mg via INTRAVENOUS
  Filled 2022-11-03: qty 200

## 2022-11-03 MED ORDER — IOHEXOL 300 MG/ML  SOLN
80.0000 mL | Freq: Once | INTRAMUSCULAR | Status: AC | PRN
  Administered 2022-11-03: 80 mL via INTRAVENOUS

## 2022-11-03 NOTE — Progress Notes (Signed)
[name] is still having problems with temperatures.  She had temperature spike of 102.5.  She is still profoundly neutropenic.  Despite Neupogen, her white cell count still is not going up.  Her white cell count is 2.6.  Hemoglobin 10.  Platelet count 147,000.  Neutrophil count is 100.  Ongoing to try her on Leukine.  This is a GM-CSF product.  Maybe, we can get the white cells up a little bit.  I will add vancomycin to the [place].  She has had negative cultures.  The ulcer in the right bucca mucosa seems to be a little bit better.  She is already on antiviral with acyclovir.  She does feel tired.  He is not eating that much.  She is taking some supplements.  I just hate that this is a real problem with her neutrophils.  This clearly is an autoimmune type of problem.  Having the rheumatoid arthritis is not helping but there is nothing that we can do about this.  Thankfully, she is not septic.  However, I worried that if the neutrophil still stay still low, that sepsis/bacteremia can become a problem.  She is not having diarrhea.  There is no nausea or vomiting.  She is having no problems with urine.  There is no dysuria.  Her vital signs are temperature 99.5.  Pulse 105.  Blood pressure 155/63.  Oxygen saturations 96%.  Her head and exam shows a oral ulcer on the right buccal mucosa.  This may be a little bit better.  She has no adenopathy in the neck.  Lungs are clear bilaterally.  Cardiac exam regular rate and rhythm.  She has no murmurs.  Abdomen is soft.  Bowel sounds are present.  There is no fluid wave.  There is no palpable liver or spleen tip.  Extremity shows no clubbing, cyanosis or edema.  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  She has LGL.  She was on methotrexate.  We are holding methotrexate now and we will discontinue this.  Maybe, the Leukine might help with her white cells a little bit better.  We will try this daily.  We will stop the Neupogen.  I think that if we get her  white cell count up and get her neutrophil count up, this ulceration in the mouth will improve and her temperatures will improve.  Hopefully, the vancomycin added to the Merrem will help bring the temperature is down.  Lattie Haw, MD  1 Brailen Macneal 1:3-4

## 2022-11-03 NOTE — Consult Note (Signed)
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 However is not hemodynamically disturbed or exhibiting sepsis physiology  Neupogen started but no improvement in neutrophil with severe chronic neutropenia. Oncology will try gm-csf   Will consider aspergillus r/o given chronic neutropenia and hx pulm nodules on 09/2022 ct chest Will also consider viral infection    No obvious sign of other localizing infection so far  Unlikely other fungal but can send endemic fungi/crypto as well   However, I agree with oncology this could be autoimmune neutropenia with neutropenia associated apthuous ulcer (which today 11/03/22 family said it is getting small - a solitary right bucal mucosa ulcer)  I do question if her leukemia plays a role currently in her symptomatology (will defer to oncology). It seems chronic anemia/neutropenia without significant change in cbc profile since her last bone marrow biopsy late 2022    Id w/u: 3/29 chest xray 3/29 bcx ngtd 4/01 bcx in progress 3/29 ucx ngtd  Plan: Repeat pan body ct scan Full respiratory viral pcr Aspergillus galactomannan  Ldh/fungitel, crypto, histo F/u bcx If all negative, will stop abx in another 2 days and observe She asked if jardiance could be associated  with current symptomatology but I am not sure; certainly I can be hold/stopped as there are other alternative for prediabetes treatment Discussed with primary team   I spent more than 80 minute reviewing data/chart, and coordinating care and >50% direct face to face time providing counseling/discussing diagnostics/treatment plan with patient   ------------------------------------------------ Principal Problem:   Acute renal failure superimposed on stage 3a chronic kidney disease Active Problems:   Hypothyroidism   Essential hypertension   Rheumatoid arthritis   Neutropenia   Iron deficiency anemia   Large granular lymphocytic leukemia   AKI (acute kidney injury)   Pancytopenia   Stomatitis   Pre-diabetes   Dehydration   Neutropenic fever    HPI: [name] [name] is a 88 y.o. female with RA, large granular lymphocytic leukemia, prediabetes, admitted for painful oral ulcer developed neutropenic fever this admission   Patient has been on low dose maintenance methotrexate weekly for her leukemia She is also on leflunomide for her RA  She has chronic bilateral LE pain and edema  She was well until a few weeks ago when jardiance was added to her medication. She then noted the painful ulcer 2 weeks prior to this admission  Outside of the ulcer she feels well  On admission, initiall afebrile, but developed fever on HD #2 Review of her cbc curve shows that she has been neutropenic since 2021, but the last 1-2 years very severe < 1000 and recently <500  She again denies headache, cough, chest pain, rash, joint pain, dysuria, diarrhea  Cbc profile also showed intermitten thrombocytopenia, chronic anemia, and this admission leukopenia  Lft normal   Despite bs Abx  meropenem and vanc added she continues to have fever. Sore getting better in size. Feels well again otherwise  No recent travel No sick contact    Family History  Problem Relation Age of Onset   Diabetes Father     Heart disease Father    Cancer Sister        breast    Heart disease Sister    Heart attack Brother    Heart attack Brother    Heart attack Brother     Social History   Tobacco Use   Smoking status: Never    Passive exposure: Never   Smokeless tobacco: Never  Vaping Use   Vaping Use: Never used  Substance Use Topics   Alcohol use: No   Drug use: No    Allergies  Allergen Reactions   Tramadol Nausea Only   Codeine Phosphate Nausea Only    Review of Systems: ROS All Other ROS was negative, except mentioned above   Past Medical History:  Diagnosis Date   Goals of care, counseling/discussion 08/06/2021   HYPERLIPIDEMIA 01/19/2007   HYPERTENSION 01/19/2007   HYPOTHYROIDISM 01/19/2007   Large granular lymphocytic leukemia ([place]) 08/06/2021   Rheumatoid arthritis(714.0) 01/19/2007       Scheduled Meds:  acetaminophen  650 mg Oral Once   antiseptic oral rinse  15 mL Mouth Rinse Q6H   feeding supplement  237 mL Oral BID BM   folic acid  1 mg Oral Daily   influenza vaccine adjuvanted  0.5 mL Intramuscular Tomorrow-1000   levothyroxine  75 mcg Oral Q0600   magic mouthwash w/lidocaine  5 mL Oral TID   magic mouthwash  15 mL Oral QID   polyethylene glycol  17 g Oral Daily   sargramostim  500 mcg Subcutaneous Daily   senna-docusate  1 tablet Oral BID   valACYclovir  1,000 mg Oral BID   Continuous Infusions:  meropenem (MERREM) IV 1 g (11/02/22 2140)   vancomycin 1,000 mg (11/03/22 1020)   [START ON 11/04/2022] vancomycin     PRN Meds:.acetaminophen   OBJECTIVE: Blood pressure (!) 149/63, pulse 89, temperature 99.9 F (37.7 C), temperature source Oral, resp. rate 16, height 5\' 1"  (1.549 m), weight 47.9 kg, SpO2 100 %.  Physical Exam  General/constitutional: no distress, pleasant HEENT: Normocephalic, PER, Conj Clear, EOMI, Oropharynx right buccal mucosa is a shallow 1 cm ulcer without purulence Neck supple CV: rrr no mrg Lungs: clear to auscultation, normal  respiratory effort Abd: Soft, Nontender Ext: trace bilateral LE edema and slight tenderness bilateral distal lower ext Skin: No Rash Neuro: nonfocal MSK: no peripheral joint swelling/tenderness/warmth; back spines nontender       Lab Results Lab Results  Component Value Date   WBC 2.6 (L) 11/03/2022   HGB 10.0 (L) 11/03/2022   HCT 31.1 (L) 11/03/2022   MCV 84.3 11/03/2022   PLT 147 (L) 11/03/2022    Lab Results  Component Value Date   CREATININE 0.92 11/03/2022   BUN 12 11/03/2022   NA 135 11/03/2022   K 3.9 11/03/2022   CL 107 11/03/2022   CO2 22 11/03/2022    Lab Results  Component Value Date   ALT 17 11/03/2022   AST 31 11/03/2022   ALKPHOS 63 11/03/2022   BILITOT 0.8 11/03/2022      Microbiology: Recent Results (from the past 240 hour(s))  Culture, blood (Routine X 2) w Reflex to ID Panel     Status: None (Preliminary result)   Collection Time: 10/31/22  8:41 AM   Specimen: BLOOD  Result Value Ref Range Status   Specimen Description   Final    BLOOD BLOOD RIGHT ARM Performed at [facility] [address], [place], [place] [address]    Special Requests   Final    BOTTLES DRAWN AEROBIC ONLY Blood Culture adequate volume Performed at [facility] [address], [place], [place] [address]    Culture   Final    NO GROWTH 3 DAYS Performed at [facility], [place] [address] [place], [place] [address]    Report Status PENDING  Incomplete  Culture, blood (Routine X 2) w Reflex to ID Panel     Status: None (Preliminary result)   Collection Time: 10/31/22  8:43 AM   Specimen: BLOOD  Result Value Ref Range Status   Specimen Description   Final    BLOOD BLOOD RIGHT HAND Performed at [facility] [address], [place], [place] [address]    Special Requests   Final    BOTTLES DRAWN AEROBIC ONLY Blood Culture results may not be optimal due to an inadequate volume of blood received in culture  bottles Performed at [facility] [address], [place], [place] [address]    Culture   Final    NO GROWTH 3 DAYS Performed at [facility], [place] [address], [place], [place] [address]    Report Status PENDING  Incomplete  Urine Culture (for pregnant, neutropenic or urologic patients or patients with an indwelling urinary catheter)     Status: None   Collection Time: 10/31/22 10:29 AM   Specimen: Urine, Catheterized  Result Value Ref Range Status   Specimen Description   Final    URINE, CATHETERIZED Performed at [facility] [address], [place], [place] [address]    Special Requests   Final    Immunocompromised Performed at [facility], [place] [address] [place], [place] [address]    Culture   Final    NO GROWTH Performed at [facility], [place] [address], [place], [place] [address]    Report Status 11/01/2022 FINAL  Final     Serology:    Imaging: If present, new imagings (plain films, ct scans, and mri) have been personally visualized and interpreted; radiology reports have been reviewed. Decision making incorporated into the Impression / Recommendations.  3/29 chest xray FINDINGS: Stable cardiomediastinal contours. No pleural fluid or airspace disease. No interstitial edema. Status post ACDF within the lower cervical spine.   IMPRESSION: No active cardiopulmonary abnormalities.   09/24/22 previous chest abd pelv ct IMPRESSION: 1. No evidence of adenopathy, including within the groins. 2. Mildly decreased sensitivity exam secondary to noncontrast technique. 3.  No acute findings. 4. Tiny bilateral upper lobe pulmonary nodules. No follow-up needed if patient is low-risk. Non-contrast chest CT can be considered in 12 months if patient is high-risk, nodule is upper lobe, and/or suspicious in morphology. This recommendation follows the consensus statement: Guidelines for Management of Incidental  Pulmonary Nodules Detected on CT Images: From the Fleischner Society 2017; Radiology 2017; 284:228-243. 5. Esophageal air fluid level suggests dysmotility or gastroesophageal reflux. 6. Coronary artery atherosclerosis. Aortic Atherosclerosis    Jabier Mutton, [facility] [place] [phone] pager    11/03/2022, 11:18 AM

## 2022-11-03 NOTE — Progress Notes (Signed)
Pharmacy Antibiotic Note  [name] [name] is a 88 y.o. female with hx RA and  large granular lymphocytic on MTX and leflunomide who presented to the ED on 10/28/2022 with c/o oral ulcer and decreased oral intake. She was found to have AKI and pancytopenia. She was started on acyclovir on 10/29/22 for mucositis and meropenem on 10/31/22 for febrile neutropenia.  Pt remains febrile and neutropenic despite being on neupogen. Pharmacy has been consulted to add vancomycin on 11/03/22.  Today, 11/03/2022: - ANC low - Tmax 102.5 - scr trending up 0.92; NS @50  ml/hr - all cultures have been negative thus far  Plan: - vancomycin 1000 mg IV x1, then 500 mg IV q24h for est AUC 445 - continue meropenem 1gm q12h - acyclovir 10 mg/kg q12h - monitor renal function closely ___________________________________________  Height: 5\' 1"  (154.9 cm) Weight: 47.9 kg (105 lb 11.2 oz) IBW/kg (Calculated) : 47.8  Temp (24hrs), Avg:99.7 F (37.6 C), Min:98.4 F (36.9 C), Max:102.5 F (39.2 C)  Recent Labs  Lab 10/30/22 0444 10/31/22 0438 11/01/22 0532 11/02/22 0438 11/03/22 0442  WBC 3.6* 2.8* 4.0 3.1* 2.6*  CREATININE 0.97 1.07* 0.69 0.77 0.92    Estimated Creatinine Clearance: 33.7 mL/min (by C-G formula based on SCr of 0.92 mg/dL).    Allergies  Allergen Reactions   Tramadol Nausea Only   Codeine Phosphate Nausea Only    3/27 Acyclovir >> 3/29 Cefepime >> 3/29 3/29 Meropenem >>  4/1 vancomycin>>  3/29 BCx x2:  3/29 UCx: neg FINAL 4/1 bcx x2:   Thank you for allowing pharmacy to be a part of this patient's care.  Lynelle Doctor 11/03/2022 8:27 AM

## 2022-11-03 NOTE — Progress Notes (Signed)
Physical Therapy Treatment Patient Details Name: [name] [name] MRN: [id number] DOB: 05-12-1935 Today's Date: 11/03/2022   History of Present Illness Patient is a 88 year old female who presented with oral ulcers and decreased oral intake.patient was admitted with acute renal failure, neutropenic fever,and mucositis. 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    PT Comments    Pt progressing steadily with PT; gait stability and tolerance. No LOB during session today. Will continue PT in acute setting, will benefit from f/u therapy at home    Recommendations for follow up therapy are one component of a multi-disciplinary discharge planning process, led by the attending physician.  Recommendations may be updated based on patient status, additional functional criteria and insurance authorization.  Follow Up Recommendations       Assistance Recommended at Discharge Intermittent Supervision/Assistance  Patient can return home with the following A little help with walking and/or transfers;A little help with bathing/dressing/bathroom;Help with stairs or ramp for entrance;Assist for transportation;Assistance with cooking/housework   Equipment Recommendations  None recommended by PT    Recommendations for Other Services       Precautions / Restrictions Precautions Precautions: Fall     Mobility  Bed Mobility Overal bed mobility: Modified Independent Bed Mobility: Supine to Sit, Sit to Supine     Supine to sit: Modified independent (Device/Increase time), HOB elevated Sit to supine: Modified independent (Device/Increase time), HOB elevated   General bed mobility comments: no physical assist, able to scoot up in bed and laterally without assist    Transfers Overall transfer level: Needs assistance Equipment used: Straight cane, None Transfers: Sit to/from Stand Sit to Stand: Min guard, Supervision           General transfer comment: for safety,  unsteady with initial stand but no overt LOB, side BOS    Ambulation/Gait Ambulation/Gait assistance: Min guard, Supervision Gait Distance (Feet): 200 Feet Assistive device: Straight cane Gait Pattern/deviations: Step-through pattern, Narrow base of support       General Gait Details: gait stability improving, no overt LOB, able to negotiate obstacles in hallway, change directions with min/guard to supervision for safety   Stairs             Wheelchair Mobility    Modified Rankin (Stroke Patients Only)       Balance     Sitting balance-Leahy Scale: Fair     Standing balance support: No upper extremity supported Standing balance-Leahy Scale: Fair               High level balance activites: Direction changes, Backward walking, Head turns High Level Balance Comments: min/guard for safety with above tasks            Cognition Arousal/Alertness: Awake/alert Behavior During Therapy: WFL for tasks assessed/performed Overall Cognitive Status: Within Functional Limits for tasks assessed                                 General Comments: family present in room as well. plesant and cooperative        Exercises      General Comments        Pertinent Vitals/Pain Pain Assessment Pain Assessment: 0-10 Pain Score: 8  Pain Location: mouth Pain Descriptors / Indicators: Grimacing Pain Intervention(s): Monitored during session    Home Living  Prior Function            PT Goals (current goals can now be found in the care plan section) Acute Rehab PT Goals Patient Stated Goal: get better PT Goal Formulation: With patient Time For Goal Achievement: 11/16/22 Potential to Achieve Goals: Good Progress towards PT goals: Progressing toward goals    Frequency    Min 3X/week      PT Plan Current plan remains appropriate    Co-evaluation              AM-PAC PT "6 Clicks" Mobility   Outcome  Measure  Help needed turning from your back to your side while in a flat bed without using bedrails?: A Little Help needed moving from lying on your back to sitting on the side of a flat bed without using bedrails?: A Little Help needed moving to and from a bed to a chair (including a wheelchair)?: A Little Help needed standing up from a chair using your arms (e.g., wheelchair or bedside chair)?: A Little Help needed to walk in hospital room?: A Little Help needed climbing 3-5 steps with a railing? : A Little 6 Click Score: 18    End of Session Equipment Utilized During Treatment: Gait belt Activity Tolerance: Patient tolerated treatment well Patient left: in bed;with call bell/phone within reach;with bed alarm set;with family/visitor present   PT Visit Diagnosis: Other abnormalities of gait and mobility (R26.89)     Time: [id number] PT Time Calculation (min) (ACUTE ONLY): 23 min  Charges:  $Gait Training: 23-37 mins                     Baxter Flattery, PT  Acute Rehab Dept ([place]) [phone]  11/03/2022    [facility] 11/03/2022, 1:07 PM

## 2022-11-03 NOTE — Progress Notes (Signed)
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  Patient seen in the ED, noted to be pancytopenic and also noted to be in AKI and subsequently admitted for further evaluation and management.   Assessment & Plan:   Principal Problem:   Acute renal failure superimposed on stage 3a chronic kidney disease Active Problems:   Neutropenic fever   Hypothyroidism   Essential hypertension   Rheumatoid arthritis   Neutropenia   Iron deficiency anemia   Large granular lymphocytic leukemia   AKI (acute kidney injury)   Pancytopenia   Stomatitis   Pre-diabetes   Dehydration  #1 acute renal failure superimposed on CKD stage IIIa -Likely secondary to prerenal azotemia in the setting of poor oral intake. -Creatinine noted on admission to be 1.81 with previous creatinine at 1.04 on 10/09/2022. -Urinalysis nitrite negative leukocyte negative greater than 500 glucose, negative for protein. -Renal function improved with hydration. -Creatinine currently at 0.92 -Continue gentle hydration.   -Supportive care.   2.  Prediabetes -Jardiance on hold with AKI. -Outpatient follow-up.  3.  Mucositis/stomatitis -Felt likely secondary to methotrexate toxicity in the setting of AKI. -Patient still with significant pain and minimal oral intake.  -Continue Magic mouthwash with lidocaine.  -Supportive care.  4.  Pancytopenia/febrile neutropenia -Patient with history of iron deficiency anemia at baseline, now neutropenic with thrombocytopenia. -Could be secondary to myelosuppression from  methotrexate in the setting of AKI. -Son states gives his mother methotrexate 2.5 mg tablets 3 times weekly to make up for the once a week dose of 6 mg daily. -Patient seen in consultation by oncology and patient started on Neupogen. -Patient initially with no signs or symptoms of infection however noted to have a Tmax of 103.1 the night of 10/30/2022.  -Fever curve was initially trending down however patient noted to have a Tmax of 102.5 at 1:06 AM today. -Patient pancultured with results pending..   -Chest x-ray done negative for any acute infiltrates.   -Urinalysis unremarkable.   -Blood cultures pending.   --Hemoglobin stable at 10.0 from 6.2, status post transfusion 2 units PRBCs 10/29/2022.   -Neutropenia fluctuating with no significant improvement since G-CSF was started.  -Oncology changing Neupogen to GM-CSF.   -Continue IV Merrem, will add IV vancomycin may need antifungal coverage.   -Due to ongoing fevers with no source despite empiric IV antibiotics, ID consulted for further evaluation and management and have CT chest abdomen and pelvis.  -ID checking respiratory viral PCR and labs for aspergillosis and fungal serologies.  -Oncology feels fever may be secondary to autoimmune neutropenia with neutropenia associated aphthous ulcer.  -Oncology and ID following and appreciate input and recommendations.    5.  Large granular lymphocytic leukemia -Noted to be on methotrexate which is currently on hold due to concerns for myelo suppression of stomatitis. -Oncology following.  6.  Rheumatoid arthritis -Continue to hold leflunomide.  7.  Hypertension -Blood pressure noted to be soft initially on presentation however improving. -Continue to hold antihypertensive medication of ACE inhibitor.  8.  Hypothyroidism -Synthroid.    DVT prophylaxis: SCDs Code Status: DNR Family Communication: Updated patient.  Updated son  and daughter-in-law at bedside.  Disposition: Home when clinically  improved, improved oral intake, resolution of neutropenia and fever, when cleared by hematology/oncology and ID.Marland Kitchen  Status is: Inpatient Remains inpatient appropriate because: Severity of illness   Consultants:  Hematology/oncology: Dr. Marin Olp 10/29/2022 ID: Dr. Gale Journey 11/03/2022  Procedures:  Transfuse 2 units packed red blood cells 10/29/2022  Chest x-ray 10/28/2022, 10/31/2022   Antimicrobials:  IV acyclovir 10/29/2022>>> IV Merrem 10/31/2022>>>>> IV vancomycin for 08/23/2022   Subjective: Patient laying in bed, son and daughter-in-law at bedside.  Patient still with significantly poor oral intake.  Patient with complaints of pain in the right buccal mucosa.  No chest pain, no shortness of breath, no cough, no abdominal pain, no diarrhea.  Patient noted to have a Tmax of 102.5 at 1:06 AM today.   Objective: Vitals:   11/03/22 0106 11/03/22 0232 11/03/22 0232 11/03/22 0740  BP: 115/89 (!) 155/63 (!) 155/63 (!) 149/63  Pulse: (!) 110 (!) 105 (!) 105 89  Resp: 20 20 20 16   Temp: (!) 102.5 F (39.2 C) 99.5 F (37.5 C) 99.5 F (37.5 C) 99.9 F (37.7 C)  TempSrc: Oral   Oral  SpO2: 97% 96% 96% 100%  Weight:      Height:        Intake/Output Summary (Last 24 hours) at 11/03/2022 1145 Last data filed at 11/03/2022 0748 Gross per 24 hour  Intake 1067.16 ml  Output 300 ml  Net 767.16 ml    Filed Weights   10/28/22 2147  Weight: 47.9 kg    Examination:  General exam: Frail. Respiratory system: Lungs clear to auscultation bilaterally.  No wheezes, no crackles, no rhonchi.  Fair air movement.  Speaking in full sentences.   Cardiovascular system: RRR with 3/6 SEM.  No JVD.  No lower extremity edema.    Gastrointestinal system: Abdomen is soft, nontender, nondistended, positive bowel sounds.  No rebound.  No guarding.  Central nervous system: Alert and oriented. No focal neurological deficits. Extremities: Symmetric 5 x 5 power. Skin: No rashes, lesions or ulcers Psychiatry:  Judgement and insight appear normal. Mood & affect appropriate.     Data Reviewed: I have personally reviewed following labs and imaging studies  CBC: Recent Labs  Lab 10/30/22 0444 10/31/22 0438 11/01/22 0532 11/02/22 0438 11/03/22 0442  WBC 3.6* 2.8* 4.0 3.1* 2.6*  NEUTROABS 0.1* 0.1* 0.1* 0.1* 0.1*  HGB 10.3* 10.1* 11.7* 9.8* 10.0*  HCT 31.7* 31.7* 36.3 31.2* 31.1*  MCV 83.6 84.5 83.6 86.4 84.3  PLT 92* 117* 123* 129* 147*     Basic Metabolic Panel: Recent Labs  Lab 10/30/22 0444 10/31/22 0438 11/01/22 0532 11/02/22 0438 11/03/22 0442  NA 137 134* 138 137 135  K 3.5 4.0 4.2 4.3 3.9  CL 104 105 107 108 107  CO2 24 22 22 23 22   GLUCOSE 92 97 82 92 83  BUN 29* 17 14 14 12   CREATININE 0.97 1.07* 0.69 0.77 0.92  CALCIUM 8.5* 8.1* 8.5* 8.1* 8.1*  MG 1.7 2.2  --  1.8 2.0     GFR: Estimated Creatinine Clearance: 33.7 mL/min (by C-G formula based on SCr of 0.92 mg/dL).  Liver Function Tests: Recent Labs  Lab 10/30/22 0444 10/31/22 0438 11/01/22 0532 11/02/22 0438 11/03/22 0442  AST 26 34 39 29 31  ALT 20 20 23 19 17   ALKPHOS 56 50 66 56 63  BILITOT 0.8 0.9 0.7 0.7 0.8  PROT 6.3* 5.9* 6.6 5.4* 5.5*  ALBUMIN 2.9* 2.8* 3.0*  2.6* 2.6*     CBG: No results for input(s): "GLUCAP" in the last 168 hours.   Recent Results (from the past 240 hour(s))  Culture, blood (Routine X 2) w Reflex to ID Panel     Status: None (Preliminary result)   Collection Time: 10/31/22  8:41 AM   Specimen: BLOOD  Result Value Ref Range Status   Specimen Description   Final    BLOOD BLOOD RIGHT ARM Performed at [facility] [address] [place], [place] [address]    Special Requests   Final    BOTTLES DRAWN AEROBIC ONLY Blood Culture adequate volume Performed at [facility] [address], [place], [place] [address]    Culture   Final    NO GROWTH 3 DAYS Performed at [facility], [place] [address], [place], [place] [address]     Report Status PENDING  Incomplete  Culture, blood (Routine X 2) w Reflex to ID Panel     Status: None (Preliminary result)   Collection Time: 10/31/22  8:43 AM   Specimen: BLOOD  Result Value Ref Range Status   Specimen Description   Final    BLOOD BLOOD RIGHT HAND Performed at [facility] [address] [place], [place] [address]    Special Requests   Final    BOTTLES DRAWN AEROBIC ONLY Blood Culture results may not be optimal due to an inadequate volume of blood received in culture bottles Performed at [facility] [address], [place], [place] [address]    Culture   Final    NO GROWTH 3 DAYS Performed at [facility], [place] [address], [place], [place] [address]    Report Status PENDING  Incomplete  Urine Culture (for pregnant, neutropenic or urologic patients or patients with an indwelling urinary catheter)     Status: None   Collection Time: 10/31/22 10:29 AM   Specimen: Urine, Catheterized  Result Value Ref Range Status   Specimen Description   Final    URINE, CATHETERIZED Performed at [facility] [address], [place], [place] [address]    Special Requests   Final    Immunocompromised Performed at [facility], [place] [address], [place], [place] [address]    Culture   Final    NO GROWTH Performed at [facility], [place] [address], [place], [place] [address]    Report Status 11/01/2022 FINAL  Final         Radiology Studies: No results found.      Scheduled Meds:  acetaminophen  650 mg Oral Once   antiseptic oral rinse  15 mL Mouth Rinse Q6H   feeding supplement  237 mL Oral BID BM   folic acid  1 mg Oral Daily   influenza vaccine adjuvanted  0.5 mL Intramuscular Tomorrow-1000   iohexol  500 mL Oral Q1H   levothyroxine  75 mcg Oral Q0600   magic mouthwash w/lidocaine  5 mL Oral TID   magic mouthwash  15 mL Oral QID   polyethylene glycol  17 g Oral Daily    sargramostim  500 mcg Subcutaneous Daily   senna-docusate  1 tablet Oral BID   valACYclovir  1,000 mg Oral BID   Continuous Infusions:  meropenem (MERREM) IV 1 g (11/02/22 2140)   [START ON 11/04/2022] vancomycin       LOS: 5 days    Time spent: 35 minutes  Irine Seal, MD [facility]   To contact the attending provider between 7A-7P or the covering provider during after hours 7P-7A, please log into the web site [email or web address] and access using universal [place] password for that web site. If you do not have the password, please call the hospital operator.  11/03/2022, 11:45 AM

## 2022-11-04 DIAGNOSIS — R5081 Fever presenting with conditions classified elsewhere: Secondary | ICD-10-CM | POA: Diagnosis not present

## 2022-11-04 DIAGNOSIS — K120 Recurrent oral aphthae: Secondary | ICD-10-CM

## 2022-11-04 DIAGNOSIS — N179 Acute kidney failure, unspecified: Secondary | ICD-10-CM | POA: Diagnosis not present

## 2022-11-04 DIAGNOSIS — C91Z0 Other lymphoid leukemia not having achieved remission: Secondary | ICD-10-CM | POA: Diagnosis not present

## 2022-11-04 DIAGNOSIS — D509 Iron deficiency anemia, unspecified: Secondary | ICD-10-CM | POA: Diagnosis not present

## 2022-11-04 DIAGNOSIS — D61818 Other pancytopenia: Secondary | ICD-10-CM | POA: Diagnosis not present

## 2022-11-04 DIAGNOSIS — E860 Dehydration: Secondary | ICD-10-CM | POA: Diagnosis not present

## 2022-11-04 DIAGNOSIS — D709 Neutropenia, unspecified: Secondary | ICD-10-CM | POA: Diagnosis not present

## 2022-11-04 LAB — COMPREHENSIVE METABOLIC PANEL
ALT: 21 U/L (ref 0–44)
AST: 44 U/L — ABNORMAL HIGH (ref 15–41)
Albumin: 2.6 g/dL — ABNORMAL LOW (ref 3.5–5.0)
Alkaline Phosphatase: 80 U/L (ref 38–126)
Anion gap: 6 (ref 5–15)
BUN: 12 mg/dL (ref 8–23)
CO2: 22 mmol/L (ref 22–32)
Calcium: 8.2 mg/dL — ABNORMAL LOW (ref 8.9–10.3)
Chloride: 106 mmol/L (ref 98–111)
Creatinine, Ser: 1.09 mg/dL — ABNORMAL HIGH (ref 0.44–1.00)
GFR, Estimated: 50 mL/min — ABNORMAL LOW (ref >60)
Glucose, Bld: 97 mg/dL (ref 70–99)
Potassium: 4.4 mmol/L (ref 3.5–5.1)
Sodium: 134 mmol/L — ABNORMAL LOW (ref 135–145)
Total Bilirubin: 1 mg/dL (ref 0.3–1.2)
Total Protein: 5.5 g/dL — ABNORMAL LOW (ref 6.5–8.1)

## 2022-11-04 LAB — CBC WITH DIFFERENTIAL/PLATELET
Abs Immature Granulocytes: 0.03 10*3/uL (ref 0.00–0.07)
Basophils Absolute: 0.1 10*3/uL (ref 0.0–0.1)
Basophils Relative: 2 %
Eosinophils Absolute: 0.1 10*3/uL (ref 0.0–0.5)
Eosinophils Relative: 1 %
HCT: 33.1 % — ABNORMAL LOW (ref 36.0–46.0)
Hemoglobin: 10.6 g/dL — ABNORMAL LOW (ref 12.0–15.0)
Immature Granulocytes: 1 %
Lymphocytes Relative: 55 %
Lymphs Abs: 1.9 10*3/uL (ref 0.7–4.0)
MCH: 26.5 pg (ref 26.0–34.0)
MCHC: 32 g/dL (ref 30.0–36.0)
MCV: 82.8 fL (ref 80.0–100.0)
Monocytes Absolute: 1.2 10*3/uL — ABNORMAL HIGH (ref 0.1–1.0)
Monocytes Relative: 35 %
Neutro Abs: 0.2 10*3/uL — CL (ref 1.7–7.7)
Neutrophils Relative %: 6 %
Platelets: 138 10*3/uL — ABNORMAL LOW (ref 150–400)
RBC: 4 MIL/uL (ref 3.87–5.11)
RDW: 22.1 % — ABNORMAL HIGH (ref 11.5–15.5)
WBC: 3.5 10*3/uL — ABNORMAL LOW (ref 4.0–10.5)
nRBC: 0.8 % — ABNORMAL HIGH (ref 0.0–0.2)

## 2022-11-04 LAB — MAGNESIUM: Magnesium: 1.8 mg/dL (ref 1.7–2.4)

## 2022-11-04 LAB — GLUCOSE, CAPILLARY: Glucose-Capillary: 87 mg/dL (ref 70–99)

## 2022-11-04 MED ORDER — ONDANSETRON HCL 4 MG/2ML IJ SOLN
4.0000 mg | Freq: Four times a day (QID) | INTRAMUSCULAR | Status: DC | PRN
  Filled 2022-11-04: qty 2

## 2022-11-04 MED ORDER — AMLODIPINE BESYLATE 5 MG PO TABS
2.5000 mg | ORAL_TABLET | Freq: Every day | ORAL | Status: DC
  Administered 2022-11-04 – 2022-11-07 (×4): 2.5 mg via ORAL
  Filled 2022-11-04 (×4): qty 1

## 2022-11-04 MED ORDER — ONDANSETRON HCL 40 MG/20ML IJ SOLN
8.0000 mg | Freq: Four times a day (QID) | INTRAMUSCULAR | Status: DC | PRN
Start: 2022-11-04 — End: 2022-11-04

## 2022-11-04 MED ORDER — IBUPROFEN 200 MG PO TABS
400.0000 mg | ORAL_TABLET | Freq: Once | ORAL | Status: AC
  Administered 2022-11-04: 400 mg via ORAL
  Filled 2022-11-04: qty 2

## 2022-11-04 MED ORDER — BENAZEPRIL HCL 5 MG PO TABS
10.0000 mg | ORAL_TABLET | Freq: Every day | ORAL | Status: DC
  Filled 2022-11-04: qty 2

## 2022-11-04 MED ORDER — SODIUM CHLORIDE 0.9 % IV SOLN: 8.0000 mg | Freq: Four times a day (QID) | INTRAVENOUS | Status: DC | PRN

## 2022-11-04 MED ORDER — VALACYCLOVIR HCL 500 MG PO TABS
1000.0000 mg | ORAL_TABLET | Freq: Every day | ORAL | Status: AC
  Administered 2022-11-05 – 2022-11-06 (×2): 1000 mg via ORAL
  Filled 2022-11-04 (×2): qty 2

## 2022-11-04 NOTE — Progress Notes (Signed)
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  Patient seen in the ED, noted to be pancytopenic and also noted to be in AKI and subsequently admitted for further evaluation and management.   Assessment & Plan:   Principal Problem:   Acute renal failure superimposed on stage 3a chronic kidney disease Active Problems:   Neutropenic fever   Hypothyroidism   Essential hypertension   Rheumatoid arthritis   Neutropenia   Iron deficiency anemia   Large granular lymphocytic leukemia   AKI (acute kidney injury)   Pancytopenia   Stomatitis   Pre-diabetes   Dehydration  #1 acute renal failure superimposed on CKD stage IIIa -Likely secondary to prerenal azotemia in the setting of poor oral intake. -Creatinine noted on admission to be 1.81 with previous creatinine at 1.04 on 10/09/2022. -Urinalysis nitrite negative leukocyte negative greater than 500 glucose, negative for protein. -Renal function improved with hydration. -Creatinine currently at 0.92 -Continue gentle hydration.   -Supportive care.   2.  Prediabetes -Jardiance on hold with AKI. -Outpatient follow-up.  3.  Mucositis/stomatitis -Felt likely secondary to methotrexate toxicity in the setting of AKI. -Patient still with significant pain and minimal oral intake.  -Continue Magic mouthwash with lidocaine.  -Supportive care.  4.  Pancytopenia/febrile neutropenia -Patient with history of iron deficiency anemia at baseline, now neutropenic with thrombocytopenia. -Could be secondary to myelosuppression from  methotrexate in the setting of AKI. -Son states gives his mother methotrexate 2.5 mg tablets 3 times weekly to make up for the once a week dose of 6 mg daily. -Patient seen in consultation by oncology and patient started on Neupogen. -Patient initially with no signs or symptoms of infection however noted to have a Tmax of 103.1 the night of 10/30/2022.  -Fever curve was initially trending down however patient still spiking fevers with a Tmax of 102.9 at approximately 5 PM on 11/03/2022.   -Patient pancultured with results pending..   -Chest x-ray done negative for any acute infiltrates.   -Urinalysis unremarkable.   -Blood cultures pending with no growth to date.   -Respiratory viral panel negative. --Hemoglobin stable at 10.6 from 10.0 from 6.2, status post transfusion 2 units PRBCs 10/29/2022.   -Neutropenia fluctuating with no significant improvement since G-CSF was started.  -Oncology changed Neupogen to GM-CSF.   -Continue IV Merrem, IV vancomycin, may need down to fungal coverage if continued fever spikes.   -Due to ongoing fevers with no source despite empiric IV antibiotics, ID consulted for further evaluation and management and ordered CT chest abdomen and pelvis which showed small bilateral pleural effusions and bibasilar atelectasis, no focal consolidation, no acute intra-abdominal or pelvic pathology, aortic atherosclerosis.   -ID ordered respiratory viral panel PCR which was negative, Fungitell pending, cryptococcal antigen negative.   -Oncology feels fever may be secondary to autoimmune neutropenia with neutropenia associated aphthous ulcer.  -Oncology is hoping that once neutropenia improves oral also will improve and patient may defervesce. -Oncology and ID following and appreciate input and recommendations.    5.  Large granular lymphocytic leukemia -Noted to be on methotrexate which is currently on hold due to concerns for  myelo suppression of stomatitis. -Oncology following.  6.   Rheumatoid arthritis -Continue to hold leflunomide.  7.  Hypertension -Blood pressure noted to be soft initially on presentation however improving. -Patient noted to have presented with acute kidney injury and as such will not resume ACE inhibitor.   -Start amlodipine 2.5 mg daily and uptitrate as needed for better blood pressure control.   8.  Hypothyroidism -Continue Synthroid.     DVT prophylaxis: SCDs Code Status: DNR Family Communication: Updated patient.  Updated son and daughter-in-law at bedside.  Disposition: Home when clinically improved, improved oral intake, resolution of neutropenia and fever, when cleared by hematology/oncology and ID.Marland Kitchen  Status is: Inpatient Remains inpatient appropriate because: Severity of illness   Consultants:  Hematology/oncology: Dr. Marin Olp 10/29/2022 ID: Dr. Gale Journey 11/03/2022  Procedures:  Transfuse 2 units packed red blood cells 10/29/2022  Chest x-ray 10/28/2022, 10/31/2022 CT chest abdomen and pelvis 11/03/2022  Antimicrobials:  IV acyclovir 10/29/2022>>> IV Merrem 10/31/2022>>>>> IV vancomycin 11/03/2022>>>>   Subjective: Patient sitting in recliner.  No chest pain.  No shortness of breath.  No abdominal pain.  Feels mouth pain is slowly improving.  Still with poor oral intake.  No diarrhea, no dysuria.  Overall starting to feel a little bit better today.  Tmax 102.9 at 5 PM 11/03/2022.  Objective: Vitals:   11/03/22 1800 11/03/22 2005 11/04/22 0450 11/04/22 0843  BP:  (!) 155/76 (!) 155/69 139/60  Pulse:  (!) 116 (!) 114 100  Resp:  19 19 16   Temp: 100.1 F (37.8 C) 100.2 F (37.9 C) (!) 100.5 F (38.1 C) 98.6 F (37 C)  TempSrc: Oral  Oral Oral  SpO2:  96% 96% 95%  Weight:      Height:        Intake/Output Summary (Last 24 hours) at 11/04/2022 1152 Last data filed at 11/04/2022 0951 Gross per 24 hour  Intake 1540.06 ml  Output 1150 ml  Net 390.06 ml    Filed Weights   10/28/22 2147  Weight: 47.9 kg    Examination:  General  exam: Frail.  Cachectic. Respiratory system: CTAB.  No wheezes, no crackles, no rhonchi.  Fair air movement.  Speaking in full sentences.  Cardiovascular system: RRR with 3/6 SEM.  No JVD.  No lower extremity edema.  Gastrointestinal system: Abdomen is soft, nontender, nondistended, positive bowel sounds.  No rebound.  No guarding.  Central nervous system: Alert and oriented. No focal neurological deficits. Extremities: Symmetric 5 x 5 power. Skin: No rashes, lesions or ulcers Psychiatry: Judgement and insight appear normal. Mood & affect appropriate.     Data Reviewed: I have personally reviewed following labs and imaging studies  CBC: Recent Labs  Lab 10/31/22 0438 11/01/22 0532 11/02/22 0438 11/03/22 0442 11/04/22 0511  WBC 2.8* 4.0 3.1* 2.6* 3.5*  NEUTROABS 0.1* 0.1* 0.1* 0.1* 0.2*  HGB 10.1* 11.7* 9.8* 10.0* 10.6*  HCT 31.7* 36.3 31.2* 31.1* 33.1*  MCV 84.5 83.6 86.4 84.3 82.8  PLT 117* 123* 129* 147* 138*     Basic Metabolic Panel: Recent Labs  Lab 10/30/22 0444 10/31/22 0438 11/01/22 0532 11/02/22 0438 11/03/22 0442 11/04/22 0511  NA 137 134* 138 137 135 134*  K 3.5 4.0 4.2 4.3 3.9 4.4  CL 104 105 107 108 107 106  CO2 24 22 22 23 22 22   GLUCOSE 92 97 82 92 83 97  BUN 29* 17 14 14 12 12   CREATININE 0.97 1.07* 0.69 0.77 0.92 1.09*  CALCIUM 8.5* 8.1* 8.5*  8.1* 8.1* 8.2*  MG 1.7 2.2  --  1.8 2.0 1.8     GFR: Estimated Creatinine Clearance: 28.5 mL/min (A) (by C-G formula based on SCr of 1.09 mg/dL (H)).  Liver Function Tests: Recent Labs  Lab 10/31/22 0438 11/01/22 0532 11/02/22 0438 11/03/22 0442 11/04/22 0511  AST 34 39 29 31 44*  ALT 20 23 19 17 21   ALKPHOS 50 66 56 63 80  BILITOT 0.9 0.7 0.7 0.8 1.0  PROT 5.9* 6.6 5.4* 5.5* 5.5*  ALBUMIN 2.8* 3.0* 2.6* 2.6* 2.6*     CBG: Recent Labs  Lab 11/04/22 0716  GLUCAP 87     Recent Results (from the past 240 hour(s))  Culture, blood (Routine X 2) w Reflex to ID Panel     Status: None  (Preliminary result)   Collection Time: 10/31/22  8:41 AM   Specimen: BLOOD  Result Value Ref Range Status   Specimen Description   Final    BLOOD BLOOD RIGHT ARM Performed at [facility] [address], [place], [place] [address]    Special Requests   Final    BOTTLES DRAWN AEROBIC ONLY Blood Culture adequate volume Performed at [facility] [address] [place], [place] [address]    Culture   Final    NO GROWTH 4 DAYS Performed at [facility], [place] [address], [place], [place] [address]    Report Status PENDING  Incomplete  Culture, blood (Routine X 2) w Reflex to ID Panel     Status: None (Preliminary result)   Collection Time: 10/31/22  8:43 AM   Specimen: BLOOD  Result Value Ref Range Status   Specimen Description   Final    BLOOD BLOOD RIGHT HAND Performed at [facility] [address] [place], [place] [address]    Special Requests   Final    BOTTLES DRAWN AEROBIC ONLY Blood Culture results may not be optimal due to an inadequate volume of blood received in culture bottles Performed at [facility] [address], [place], [place] [address]    Culture   Final    NO GROWTH 4 DAYS Performed at [facility], [place] [address] [place], [place] [address]    Report Status PENDING  Incomplete  Urine Culture (for pregnant, neutropenic or urologic patients or patients with an indwelling urinary catheter)     Status: None   Collection Time: 10/31/22 10:29 AM   Specimen: Urine, Catheterized  Result Value Ref Range Status   Specimen Description   Final    URINE, CATHETERIZED Performed at [facility] [address], [place], [place] [address]    Special Requests   Final    Immunocompromised Performed at [facility], [place] [address], [place], [place] [address]    Culture   Final    NO GROWTH Performed at [facility], [place] [address] [place], [place] [address]    Report Status 11/01/2022 FINAL  Final  Culture, blood (Routine X 2) w Reflex to ID Panel     Status: None (Preliminary result)   Collection Time: 11/03/22  7:23 AM   Specimen: BLOOD  Result Value Ref Range Status   Specimen Description   Final    BLOOD BLOOD RIGHT ARM AEROBIC BOTTLE ONLY Performed at [facility] [address], [place], [place] [address]    Special Requests   Final    BOTTLES  DRAWN AEROBIC ONLY Blood Culture results may not be optimal due to an inadequate volume of blood received in culture bottles Performed at [facility], [place] [address], [place], [place] [address]    Culture   Final    NO GROWTH < 24 HOURS Performed at [facility] [address] [place], [place] [address]    Report Status PENDING  Incomplete  Culture, blood (Routine X 2) w Reflex to ID Panel     Status: None (Preliminary result)   Collection Time: 11/03/22  7:23 AM   Specimen: BLOOD  Result Value Ref Range Status   Specimen Description   Final    BLOOD BLOOD RIGHT HAND AEROBIC BOTTLE ONLY Performed at [facility] [address] [place], [place] [address]    Special Requests   Final    BOTTLES DRAWN AEROBIC ONLY Blood Culture adequate volume Performed at [facility] [address] [place], [place] [address]    Culture   Final    NO GROWTH < 24 HOURS Performed at [facility] [address], [place], [place] [address]    Report Status PENDING  Incomplete  Respiratory (~20 pathogens) panel by PCR     Status: None   Collection Time: 11/03/22  4:07 PM   Specimen: Nasopharyngeal Swab; Respiratory  Result Value Ref Range Status   Adenovirus NOT DETECTED NOT DETECTED Final   Coronavirus 229E NOT DETECTED NOT DETECTED Final    Comment: (NOTE) The Coronavirus on the Respiratory Panel, DOES NOT test for the novel  Coronavirus (2019 nCoV)    Coronavirus HKU1 NOT  DETECTED NOT DETECTED Final   Coronavirus NL63 NOT DETECTED NOT DETECTED Final   Coronavirus OC43 NOT DETECTED NOT DETECTED Final   Metapneumovirus NOT DETECTED NOT DETECTED Final   Rhinovirus / Enterovirus NOT DETECTED NOT DETECTED Final   Influenza A NOT DETECTED NOT DETECTED Final   Influenza B NOT DETECTED NOT DETECTED Final   Parainfluenza Virus 1 NOT DETECTED NOT DETECTED Final   Parainfluenza Virus 2 NOT DETECTED NOT DETECTED Final   Parainfluenza Virus 3 NOT DETECTED NOT DETECTED Final   Parainfluenza Virus 4 NOT DETECTED NOT DETECTED Final   Respiratory Syncytial Virus NOT DETECTED NOT DETECTED Final   Bordetella pertussis NOT DETECTED NOT DETECTED Final   Bordetella Parapertussis NOT DETECTED NOT DETECTED Final   Chlamydophila pneumoniae NOT DETECTED NOT DETECTED Final   Mycoplasma pneumoniae NOT DETECTED NOT DETECTED Final    Comment: Performed at [facility], [place], [place], [place] [address]         Radiology Studies: CT CHEST ABDOMEN PELVIS W CONTRAST  Result Date: 11/03/2022 CLINICAL DATA:  Neutropenic fever. EXAM: CT CHEST, ABDOMEN, AND PELVIS WITH CONTRAST TECHNIQUE: Multidetector CT imaging of the chest, abdomen and pelvis was performed following the standard protocol during bolus administration of intravenous contrast. RADIATION DOSE REDUCTION: This exam was performed according to the departmental dose-optimization program which includes automated exposure control, adjustment of the mA and/or kV according to patient size and/or use of iterative reconstruction technique. CONTRAST:  77mL OMNIPAQUE IOHEXOL 300 MG/ML  SOLN COMPARISON:  Chest radiograph dated 10/31/2022. Chest CT dated 09/24/2022. FINDINGS: CT CHEST FINDINGS Cardiovascular: There is no cardiomegaly or pericardial effusion. Coronary vascular calcification of the LAD. Mild atherosclerotic calcification of the thoracic aorta. No aneurysmal dilatation or dissection. The origins of the great vessels  of the aortic arch and the central pulmonary arteries appear patent.  Mediastinum/Nodes: No hilar or mediastinal adenopathy. The esophagus is grossly unremarkable. No mediastinal fluid collection. Lungs/Pleura: Small bilateral pleural effusions and bibasilar atelectasis. No focal consolidation, or pneumothorax. The central airways are patent. Musculoskeletal: Degenerative changes of the spine. No acute osseous pathology. CT ABDOMEN PELVIS FINDINGS No intra-abdominal free air or free fluid. Hepatobiliary: Small cyst in the right lobe of the liver as well as additional subcentimeter hypodense lesions which are too small to characterize. There is mild biliary dilatation, post cholecystectomy. Pancreas: Unremarkable. No pancreatic ductal dilatation or surrounding inflammatory changes. Spleen: Normal in size without focal abnormality. Adrenals/Urinary Tract: The adrenal glands are unremarkable. There is no hydronephrosis on either side. There is symmetric enhancement and excretion of contrast by both kidneys. The visualized ureters and the urinary bladder appear unremarkable. Stomach/Bowel: There is no bowel obstruction or active inflammation. The appendix is not visualized with certainty. No inflammatory changes identified in the right lower quadrant. Vascular/Lymphatic: Mild aortoiliac atherosclerotic disease. The IVC is unremarkable. No portal venous gas. There is no adenopathy. Reproductive: Hysterectomy.  No adnexal masses. Other: None Musculoskeletal: Degenerative changes of the spine. No acute osseous pathology. IMPRESSION: 1. Small bilateral pleural effusions and bibasilar atelectasis. No focal consolidation. 2. No acute intra-abdominal or pelvic pathology. 3.  Aortic Atherosclerosis (ICD10-I70.0). Electronically Signed   By: Anner Crete M.D.   On: 11/03/2022 21:22        Scheduled Meds:  acetaminophen  650 mg Oral Once   amLODipine  2.5 mg Oral Daily   antiseptic oral rinse  15 mL Mouth Rinse Q6H    feeding supplement  237 mL Oral BID BM   folic acid  1 mg Oral Daily   influenza vaccine adjuvanted  0.5 mL Intramuscular Tomorrow-1000   levothyroxine  75 mcg Oral Q0600   magic mouthwash w/lidocaine  10 mL Oral QID   polyethylene glycol  17 g Oral Daily   sargramostim  500 mcg Subcutaneous Daily   senna-docusate  1 tablet Oral BID   [START ON 11/05/2022] valACYclovir  1,000 mg Oral Daily   Continuous Infusions:  meropenem (MERREM) IV 1 g (11/04/22 0907)   vancomycin 500 mg (11/04/22 1130)     LOS: 6 days    Time spent: 35 minutes    Irine Seal, MD [facility]   To contact the attending provider between 7A-7P or the covering provider during after hours 7P-7A, please log into the web site [email or web address] and access using universal [place] password for that web site. If you do not have the password, please call the hospital operator.  11/04/2022, 11:52 AM

## 2022-11-04 NOTE — Progress Notes (Signed)
PHARMACY NOTE:  ANTIMICROBIAL RENAL DOSAGE ADJUSTMENT  Current antimicrobial regimen includes a mismatch between antimicrobial dosage and estimated renal function.  As per policy approved by the Pharmacy & Therapeutics and Medical Executive Committees, the antimicrobial dosage will be adjusted accordingly.  Current antimicrobial dosage:  valacyclovir 1000 mg PO BID  Indication: mucositis   Renal Function:  Estimated Creatinine Clearance: 28.5 mL/min (A) (by C-G formula based on SCr of 1.09 mg/dL (H)). []      On intermittent HD, scheduled: []      On CRRT    Antimicrobial dosage has been changed to:  valacyclovir 1000 mg PO daily   Additional comments:   Thank you for allowing pharmacy to be a part of this patient's care.  Eliseo Gum, PharmD PGY1 Pharmacy Resident   11/04/2022  11:22 AM

## 2022-11-04 NOTE — Progress Notes (Signed)
Physical Therapy Treatment Patient Details Name: [name] [name] MRN: [id number] DOB: [birth year] Today's Date: 11/04/2022   History of Present Illness Patient is a 88 year old female who presented with oral ulcers and decreased oral intake.patient was admitted with acute renal failure, neutropenic fever,and mucositis on 10/28/22. [id number] granular lymphocytic leukemia, RA, HTN, hypothyroidism.    PT Comments    Pt with significant decline in mobility today compared to yesterday's PT note (and even this morning RN reports pt ambulated in hallway).  Pt lethargic and requiring mod A to stand with posterior lean and fatigues easily.  She was only able to tolerate transfer to and from [facility] , with 5 sit to stands.  Not able to ambulate this afternoon. Pt's HR 120's rest and up to 147 bpm with transfers, also noting pt feeling warm to touch and RN reports has been spiking fevers at times.  At current mobility level , pt may need higher level of care at d/c - updated recommendations; however, hopefully as medical status improves can progress back to hallway ambulation and initial d/c recommendations.       Recommendations for follow up therapy are one component of a multi-disciplinary discharge planning process, led by the attending physician.  Recommendations may be updated based on patient status, additional functional criteria and insurance authorization.  Follow Up Recommendations  Can patient physically be transported by private vehicle: Yes    Assistance Recommended at Discharge Frequent or constant Supervision/Assistance  Patient can return home with the following A lot of help with walking and/or transfers;A lot of help with bathing/dressing/bathroom   Equipment Recommendations  Rolling walker (2 wheels)    Recommendations for Other Services       Precautions / Restrictions Precautions Precautions: Fall     Mobility  Bed Mobility               General bed mobility comments:  In chair    Transfers Overall transfer level: Needs assistance Equipment used: None, Rolling walker (2 wheels), Straight cane Transfers: Sit to/from Stand, Bed to chair/wheelchair/BSC Sit to Stand: Mod assist   Step pivot transfers: Mod assist       General transfer comment: Performed x 5 during session (chair x 4 and bsc x 1).  Pt requiring max cues for foot placement, hand placement, scooting forward, leaning forward, and then standing completely upright.  Pt requiring mod A with tendency to posterior lean and no significant improvement throughout session.  Mod A step pivot to and from [facility] with RW - with assist for balance and RW    Ambulation/Gait               General Gait Details: Pt was unsteady, fatiguing easily, and only able to take steps to/from [facility]   Stairs             Wheelchair Mobility    Modified Rankin (Stroke Patients Only)       Balance Overall balance assessment: Needs assistance Sitting-balance support: Feet supported, Bilateral upper extremity supported Sitting balance-Leahy Scale: Poor Sitting balance - Comments: Needing UE support wtih ttendency to posterior lean   Standing balance support: Bilateral upper extremity supported Standing balance-Leahy Scale: Poor Standing balance comment: Requiring UE support and mod A with tendency to posterior lean.  Performed multiple stands with cues for posture but unable to correct.  Fatigues easily, only standing 5-20 seconds at a time  Cognition Arousal/Alertness: Lethargic Behavior During Therapy: Restless Overall Cognitive Status: Within Functional Limits for tasks assessed                                 General Comments: Pt is pleasant and cooperative but was lethargic and restless at times.  Stating she is just having a bad evening and feels "sorry."        Exercises General Exercises - Lower Extremity Ankle Circles/Pumps: AROM, Both,  10 reps, Seated Long Arc Quad: AROM, Both, 10 reps, Seated Hip Flexion/Marching: AROM, Both, 10 reps, Seated Other Exercises Other Exercises: Attempting LE exercises to improve ROM and alertness.  Pt requiring cues to complete and fatigued easily    General Comments General comments (skin integrity, edema, etc.): HR 120's rest and up to 147 bpm wtih transfers.  BP was stable throughout session 130's/80's.  Did note that pt felt warm to touch - notified RN , who reports that pt spikes a fever at times.      Pertinent Vitals/Pain Pain Assessment Pain Assessment: No/denies pain    Home Living                          Prior Function            PT Goals (current goals can now be found in the care plan section) Progress towards PT goals: Not progressing toward goals - comment (significant decline - elevated HR and temp today)    Frequency    Min 3X/week      PT Plan Discharge plan needs to be updated    Co-evaluation              AM-PAC PT "6 Clicks" Mobility   Outcome Measure  Help needed turning from your back to your side while in a flat bed without using bedrails?: A Little Help needed moving from lying on your back to sitting on the side of a flat bed without using bedrails?: A Lot Help needed moving to and from a bed to a chair (including a wheelchair)?: A Lot Help needed standing up from a chair using your arms (e.g., wheelchair or bedside chair)?: A Lot Help needed to walk in hospital room?: Total Help needed climbing 3-5 steps with a railing? : Total 6 Click Score: 11    End of Session Equipment Utilized During Treatment: Gait belt Activity Tolerance: Patient limited by fatigue (and HR) Patient left: with call bell/phone within reach;with chair alarm set;in chair Nurse Communication: Mobility status;Other (comment) (decline in status, warm to touch) PT Visit Diagnosis: Other abnormalities of gait and mobility (R26.89)     Time: [id number] PT  Time Calculation (min) (ACUTE ONLY): 23 min  Charges:  $Therapeutic Activity: 23-37 mins                     Abran Richard, PT Acute [facility] [phone]    Karlton Lemon 11/04/2022, 6:15 PM

## 2022-11-04 NOTE — Progress Notes (Signed)
Occupational Therapy Treatment Patient Details Name: [name] [name] MRN: [id number] DOB: 1935/05/14 Today's Date: 11/04/2022   History of present illness Patient is a 88 year old female who presented with oral ulcers and decreased oral intake.patient was admitted with acute renal failure, neutropenic fever,and mucositis. [id number] granular lymphocytic leukemia, RA, HTN, hypothyroidism.   OT comments  Patient was able to stand at sink in bathroom and engage in bathing/dressing tasks. Patient is progressing towards goals. Patient needed min guard for task with noted to have some unsteadiness with one LOB with min A to maintain standing balance with cane in opposite hand. Patient's discharge plan remains appropriate at this time. OT will continue to follow acutely.     Recommendations for follow up therapy are one component of a multi-disciplinary discharge planning process, led by the attending physician.  Recommendations may be updated based on patient status, additional functional criteria and insurance authorization.    Assistance Recommended at Discharge Intermittent Supervision/Assistance  Patient can return home with the following  Assistance with cooking/housework;Assist for transportation;Direct supervision/assist for financial management;Help with stairs or ramp for entrance;Direct supervision/assist for medications management;A little help with bathing/dressing/bathroom   Equipment Recommendations  None recommended by OT       Precautions / Restrictions Precautions Precautions: Fall Restrictions Weight Bearing Restrictions: No       Mobility Bed Mobility Overal bed mobility: Modified Independent                                    ADL either performed or assessed with clinical judgement   ADL Overall ADL's : Needs assistance/impaired     Grooming: Wash/dry face;Wash/dry hands;Brushing hair;Standing;Supervision/safety   Upper Body Bathing: Standing;Min  guard Upper Body Bathing Details (indicate cue type and reason): at sink Lower Body Bathing: Min guard;Sit to/from stand Lower Body Bathing Details (indicate cue type and reason): with increased time Upper Body Dressing : Min guard;Standing       Toilet Transfer: Copy Details (indicate cue type and reason): with cane                  Cognition Arousal/Alertness: Awake/alert Behavior During Therapy: WFL for tasks assessed/performed Overall Cognitive Status: Within Functional Limits for tasks assessed               General Comments: patient is plesant and cooperative, some memory deficits                   Pertinent Vitals/ Pain       Pain Assessment Pain Assessment: Faces Faces Pain Scale: Hurts little more Pain Location: mouth Pain Descriptors / Indicators: Grimacing Pain Intervention(s): Limited activity within patient's tolerance, Monitored during session         Frequency  Min 1X/week        Progress Toward Goals  OT Goals(current goals can now be found in the care plan section)  Progress towards OT goals: Progressing toward goals     Plan Discharge plan remains appropriate       AM-PAC OT "6 Clicks" Daily Activity     Outcome Measure   Help from another person eating meals?: None Help from another person taking care of personal grooming?: None Help from another person toileting, which includes using toliet, bedpan, or urinal?: A Little Help from another person bathing (including washing, rinsing, drying)?: A Little Help from another person to put on and taking  off regular upper body clothing?: None Help from another person to put on and taking off regular lower body clothing?: A Little 6 Click Score: 21    End of Session Equipment Utilized During Treatment: Other (comment) (cane)  OT Visit Diagnosis: Unsteadiness on feet (R26.81);Other abnormalities of gait and mobility (R26.89)   Activity Tolerance  Patient tolerated treatment well   Patient Left in chair;with call bell/phone within reach;with chair alarm set   Nurse Communication Other (comment) (ok to participate)        Time: [id number] OT Time Calculation (min): 20 min  Charges: OT General Charges $OT Visit: 1 Visit OT Treatments $Self Care/Home Management : 8-22 mins  Rennie Plowman, MS Acute Rehabilitation Department Office# [phone]   Willa Rough 11/04/2022, 11:15 AM

## 2022-11-04 NOTE — Progress Notes (Signed)
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 However is not hemodynamically disturbed or exhibiting sepsis physiology   Neupogen started but no improvement in neutrophil with severe chronic neutropenia. 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 It seems chronic anemia/neutropenia without significant change in cbc profile since her last bone marrow biopsy late 2022       Id w/u: 3/29 chest xray 3/29 bcx ngtd 4/01 bcx ngtd 3/29 ucx ngtd  -------------- 11/04/22 assessment Bcx continues to remain negative Fever still but ?improving curve Her oral ulcer continues to improve No other complaint Slight bump in neutrophil with gm-csf will see if it continues   Asked nursing staff to  sent aspergillus galactomannan. Chest ct rather unconcerning to suggest invasive pulm aspergillosis though. Ldh elevated but not helpful with current picture. Fungitel pending  Respiratory viral pcr negative Serum crypto ag negative  Urine histo ag pending but I have low suspicion given ct scan finding    Plan: F/u asp gm, fungitel, histo ag F/u bcx Will stop abx tomorrow if the blood culture is negative I would not recommend adding any empiric antifungal, but rather monitor Discussed with primary team   I spent more than 50 minute reviewing data/chart, and coordinating care and >50% direct face to face time providing counseling/discussing diagnostics/treatment plan with patient    Principal Problem:   Acute renal failure superimposed on stage 3a chronic kidney disease Active Problems:   Hypothyroidism   Essential hypertension   Rheumatoid arthritis   Neutropenia   Iron deficiency anemia   Large granular lymphocytic leukemia   AKI (acute kidney injury)   Pancytopenia   Stomatitis   Pre-diabetes   Dehydration   Neutropenic fever   Allergies  Allergen Reactions   Tramadol Nausea Only   Codeine Phosphate Nausea Only    Scheduled Meds:  acetaminophen  650 mg Oral Once   amLODipine  2.5 mg Oral Daily   antiseptic oral rinse  15 mL Mouth Rinse Q6H   feeding supplement  237 mL Oral  BID BM   folic acid  1 mg Oral Daily   influenza vaccine adjuvanted  0.5 mL Intramuscular Tomorrow-1000   levothyroxine  75 mcg Oral Q0600   magic mouthwash w/lidocaine  10 mL Oral QID   polyethylene glycol  17 g Oral Daily   sargramostim  500 mcg Subcutaneous Daily   senna-docusate  1 tablet Oral BID   [START ON 11/05/2022] valACYclovir  1,000 mg Oral Daily   Continuous Infusions:  meropenem (MERREM) IV 1 g (11/04/22 0907)   vancomycin 500 mg (11/04/22 1130)   PRN Meds:.acetaminophen, ondansetron (ZOFRAN) IV   SUBJECTIVE: Sore feels better in her mouth No headache, sorethroat,  n/v/diarrhea, rash, abd pain/chest pain  Fever still but appears the curve is improving  Chest ct reviewed    Review of Systems: ROS All other ROS was negative, except mentioned above     OBJECTIVE: Vitals:   11/03/22 2005 11/04/22 0450 11/04/22 0843 11/04/22 1340  BP: (!) 155/76 (!) 155/69 139/60 (!) 159/65  Pulse: (!) 116 (!) 114 100 (!) 102  Resp: 19 19 16 18   Temp: 100.2 F (37.9 C) (!) 100.5 F (38.1 C) 98.6 F (37 C) 99.9 F (37.7 C)  TempSrc:  Oral Oral   SpO2: 96% 96% 95% 99%  Weight:      Height:       Body mass index is 19.97 kg/m.  Physical Exam General/constitutional: no distress, pleasant HEENT: Normocephalic, PER, Conj Clear, EOMI, Oropharynx with stable size oral ulcer right buccal mucosa Neck supple CV: rrr no mrg Lungs: clear to auscultation, normal respiratory effort Abd: Soft, Nontender Ext: no edema Skin: No Rash Neuro: nonfocal MSK: no peripheral joint swelling/tenderness/warmth; back spines nontender      Lab Results Lab Results  Component Value Date   WBC 3.5 (L) 11/04/2022   HGB 10.6 (L) 11/04/2022   HCT 33.1 (L) 11/04/2022   MCV 82.8 11/04/2022   PLT 138 (L) 11/04/2022    Lab Results  Component Value Date   CREATININE 1.09 (H) 11/04/2022   BUN 12 11/04/2022   NA 134 (L) 11/04/2022   K 4.4 11/04/2022   CL 106 11/04/2022   CO2 22 11/04/2022    Lab Results  Component Value Date   ALT 21 11/04/2022   AST 44 (H) 11/04/2022   ALKPHOS 80 11/04/2022   BILITOT 1.0 11/04/2022      Microbiology: Recent Results (from the past 240 hour(s))  Culture, blood (Routine X 2) w Reflex to ID Panel     Status: None (Preliminary result)   Collection Time: 10/31/22  8:41 AM   Specimen: BLOOD  Result Value Ref Range Status   Specimen Description   Final    BLOOD BLOOD RIGHT ARM Performed at [facility], [place] [address], [place], [place] [address]    Special Requests   Final    BOTTLES DRAWN AEROBIC ONLY Blood  Culture adequate volume Performed at [place] [facility] [address], [place], [place] [address]    Culture   Final    NO GROWTH 4 DAYS Performed at [facility], [place] [address], [place], [place] [address]    Report Status PENDING  Incomplete  Culture, blood (Routine X 2) w Reflex to ID Panel     Status: None (Preliminary result)   Collection Time: 10/31/22  8:43 AM   Specimen: BLOOD  Result Value Ref Range Status   Specimen Description   Final    BLOOD BLOOD RIGHT  HAND Performed at [facility], [place] [address] [place], [place] [address]    Special Requests   Final    BOTTLES DRAWN AEROBIC ONLY Blood Culture results may not be optimal due to an inadequate volume of blood received in culture bottles Performed at [place] [address], [place], [place] [address]    Culture   Final    NO GROWTH 4 DAYS Performed at [facility], [place] [address], [place], [place] [address]    Report Status PENDING  Incomplete  Urine Culture (for pregnant, neutropenic or urologic patients or patients with an indwelling urinary catheter)     Status: None   Collection Time: 10/31/22 10:29 AM   Specimen: Urine, Catheterized  Result Value Ref Range Status   Specimen Description   Final    URINE, CATHETERIZED Performed at [facility] [address], [place], [place] [address]    Special Requests   Final    Immunocompromised Performed at [facility], [place] [address], [place], [place] [address]    Culture   Final    NO GROWTH Performed at [facility], [place] [address] [place], [place] [address]    Report Status 11/01/2022 FINAL  Final  Culture, blood (Routine X 2) w Reflex to ID Panel     Status: None (Preliminary result)   Collection Time: 11/03/22  7:23 AM   Specimen: BLOOD  Result Value Ref Range Status   Specimen Description   Final    BLOOD BLOOD RIGHT ARM AEROBIC BOTTLE  ONLY Performed at [facility] [address], [place], [place] [address]    Special Requests   Final    BOTTLES DRAWN AEROBIC ONLY Blood Culture results may not be optimal due to an inadequate volume of blood received in culture bottles Performed at [facility] [address] [place], [place] [address]    Culture   Final    NO GROWTH < 24 HOURS Performed at [facility] [address], [place], [place] [address]    Report Status PENDING  Incomplete  Culture, blood (Routine X 2) w Reflex to ID Panel     Status: None (Preliminary result)   Collection Time: 11/03/22  7:23 AM   Specimen: BLOOD  Result Value Ref Range Status   Specimen Description   Final    BLOOD BLOOD RIGHT HAND AEROBIC BOTTLE ONLY Performed at [facility] [address], [place], [place] [address]    Special Requests   Final    BOTTLES DRAWN AEROBIC ONLY Blood Culture adequate volume Performed at [facility] [address], [place], [place] [address]    Culture   Final    NO GROWTH < 24 HOURS Performed at [facility] [address] [place],  [address]    Report Status PENDING  Incomplete  Respiratory (~20 pathogens) panel by PCR     Status: None   Collection Time: 11/03/22  4:07 PM   Specimen: Nasopharyngeal Swab; Respiratory  Result Value Ref Range Status   Adenovirus NOT DETECTED NOT DETECTED Final   Coronavirus 229E NOT DETECTED NOT DETECTED Final    Comment: (NOTE) The Coronavirus on the Respiratory Panel, DOES NOT test for the novel  Coronavirus (2019 nCoV)    Coronavirus HKU1 NOT DETECTED NOT DETECTED Final   Coronavirus NL63 NOT DETECTED NOT DETECTED Final   Coronavirus OC43 NOT DETECTED  NOT DETECTED Final   Metapneumovirus NOT DETECTED NOT DETECTED Final   Rhinovirus / Enterovirus NOT DETECTED NOT DETECTED Final   Influenza A NOT DETECTED NOT DETECTED Final   Influenza B NOT DETECTED NOT  DETECTED Final   Parainfluenza Virus 1 NOT DETECTED NOT DETECTED Final   Parainfluenza Virus 2 NOT DETECTED NOT DETECTED Final   Parainfluenza Virus 3 NOT DETECTED NOT DETECTED Final   Parainfluenza Virus 4 NOT DETECTED NOT DETECTED Final   Respiratory Syncytial Virus NOT DETECTED NOT DETECTED Final   Bordetella pertussis NOT DETECTED NOT DETECTED Final   Bordetella Parapertussis NOT DETECTED NOT DETECTED Final   Chlamydophila pneumoniae NOT DETECTED NOT DETECTED Final   Mycoplasma pneumoniae NOT DETECTED NOT DETECTED Final    Comment: Performed at [facility], [place] [address] [place], [place] [address]     Serology:   Imaging: If present, new imagings (plain films, ct scans, and mri) have been personally visualized and interpreted; radiology reports have been reviewed. Decision making incorporated into the Impression / Recommendations.   11/04/22 chest abd pelv ct 1. Small bilateral pleural effusions and bibasilar atelectasis. No focal consolidation. 2. No acute intra-abdominal or pelvic pathology. 3.  Aortic Atherosclerosis  Jabier Mutton, [place] for [facility]ious [place] [phone] pager    11/04/2022, 1:53 PM

## 2022-11-04 NOTE — TOC Initial Note (Signed)
Transition of Care [facility]) - Initial/Assessment Note    Patient Details  Name: [name] [name] MRN: [id number] Date of Birth: 1935/08/04  Transition of Care [facility]) CM/SW Contact:    Dessa Phi, RN Phone Number: 11/04/2022, 3:43 PM  Clinical Narrative: Damaris Schooner to Todd(son), & Pam(dtr n law) about d/c plans-d/c home w/HHC-will check on [facility] agency for HHPT only they decline HHPT(ordered)-no preference-has own transport home. They will decide on location @ d/c-either patient's home or Todd's home, both in [place] or [place] [address].                 Expected Discharge Plan: [place] Barriers to Discharge: Continued Medical Work up   Patient Goals and CMS Choice Patient states their goals for this hospitalization and ongoing recovery are::  (Home) CMS Medicare.gov Compare Post Acute Care list provided to:: Patient Represenative (must comment) (Pam(dtr in law)) Choice offered to / list presented to : Adult [place] ownership interest in [facility].provided to:: Adult Children    Expected Discharge Plan and Services   Discharge Planning Services: CM Consult Post Acute Care Choice: [place] arrangements for the past 2 months: Single Family Home                                      Prior Living Arrangements/Services Living arrangements for the past 2 months: Single Family Home Lives with:: Self Patient language and need for interpreter reviewed:: Yes Do you feel safe going back to the place where you live?: Yes      Need for Family Participation in Patient Care: Yes (Comment) Care giver support system in place?: Yes (comment) Current home services: DME (cane,shower chair) Criminal Activity/Legal Involvement Pertinent to Current Situation/Hospitalization: No - Comment as needed  Activities of Daily Living Home Assistive Devices/Equipment: Cane (specify quad or straight) (straight cane) ADL Screening (condition at time  of admission) Patient's cognitive ability adequate to safely complete daily activities?: Yes Is the patient deaf or have difficulty hearing?: No Does the patient have difficulty seeing, even when wearing glasses/contacts?: No Does the patient have difficulty concentrating, remembering, or making decisions?: No Patient able to express need for assistance with ADLs?: Yes Does the patient have difficulty dressing or bathing?: No Independently performs ADLs?: Yes (appropriate for developmental age) Does the patient have difficulty walking or climbing stairs?: No Weakness of Legs: Both Weakness of Arms/Hands: Both  Permission Sought/Granted Permission sought to share information with : Case Manager Permission granted to share information with : Yes, Verbal Permission Granted  Share Information with NAME:  (Case manager)           Emotional Assessment Appearance:: Appears stated age Attitude/Demeanor/Rapport: Gracious Affect (typically observed): Accepting Orientation: : Oriented to Self, Oriented to Place, Oriented to  Time, Oriented to Situation Alcohol / Substance Use: Not Applicable Psych Involvement: No (comment)  Admission diagnosis:  AKI (acute kidney injury) [N17.9] Patient Active Problem List   Diagnosis Date Noted   Aphthous ulcer 11/04/2022   Neutropenic fever 10/31/2022   Dehydration 10/29/2022   AKI (acute kidney injury) 10/28/2022   Pancytopenia 10/28/2022   Stomatitis 10/28/2022   Pre-diabetes 10/28/2022   Protein-calorie malnutrition, severe 09/25/2022   Acute diarrhea 09/24/2022   Unintentional weight loss 09/24/2022   Impaired mental alertness 09/24/2022   Acute renal failure superimposed on stage 3a chronic kidney disease 09/24/2022   Need for  RSV immunization 04/23/2022   Prediabetes 04/23/2022   Large granular lymphocytic leukemia 08/06/2021   Goals of care, counseling/discussion 08/06/2021   Need for influenza vaccination 04/18/2021   Iron deficiency  anemia 11/20/2020   Lymphocytosis 10/16/2020   Neutropenia 10/16/2020   Paroxysmal supraventricular tachycardia 10/15/2020   Elevated LDL cholesterol level 01/12/2020   DDD (degenerative disc disease), cervical 07/13/2018   Chronic kidney disease (CKD) stage G3b 01/21/2017   History of hypothyroidism 01/20/2017   History of congestive heart failure 08/26/2016   Osteopenia of multiple sites 08/25/2016   DDD cervical spine 08/25/2016   Primary osteoarthritis of both hands 08/25/2016   High risk medication use 06/11/2016   CHF, acute 02/01/2013   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   Hypothyroidism 01/19/2007   Dyslipidemia 01/19/2007   Essential hypertension 01/19/2007   Rheumatoid arthritis 01/19/2007   PCP:  Libby Maw, MD Pharmacy:   [id number] [place], [place] [address] Phone: [phone] Fax: [phone]     Social Determinants of Health (SDOH) Social History: SDOH Screenings   Food Insecurity: No Food Insecurity (10/28/2022)  Housing: Low Risk  (10/29/2022)  Transportation Needs: No Transportation Needs (10/29/2022)  Utilities: Not At Risk (10/29/2022)  Alcohol Screen: Low Risk  (07/31/2021)  Depression (PHQ2-9): Low Risk  (08/08/2022)  Financial Resource Strain: Low Risk  (08/08/2022)  Physical Activity: Insufficiently Active (08/08/2022)  Social Connections: Moderately Isolated (07/31/2021)  Stress: No Stress Concern Present (08/08/2022)  Tobacco Use: Low Risk  (10/29/2022)   SDOH Interventions: Food Insecurity Interventions: Intervention Not Indicated Housing Interventions: Intervention Not Indicated Transportation Interventions: Intervention Not Indicated Utilities Interventions: Intervention Not Indicated   Readmission Risk Interventions    09/26/2022   10:27 AM  Readmission Risk Prevention Plan  Transportation Screening Complete  PCP or Specialist Appt within 5-7 Days Complete  Home Care  Screening Complete  Medication Review (RN CM) Complete

## 2022-11-04 NOTE — Care Management Important Message (Signed)
Important Message  Patient Details IM Letter given to the Patient. Name: [name] [name] MRN: [id number] Date of Birth: [birth year]   Medicare Important Message Given:  Yes     Kerin Salen 11/04/2022, 10:42 AM

## 2022-11-04 NOTE — Progress Notes (Signed)
Ms. Gervasi still has little of a temperature.  Her Tmax is 100.5.  She does have some sweats.  The ulcer in her mouth is slowly getting better.  I now have her on GM-CSF.  Her ANC is now 200.  Her monocytes are 35%.  Hopefully, this might be an indicator that we get her neutrophils up now.  If we just get her neutrophils up, then I think the temperatures will go away and his ulcer will heal more quickly.  She still does not have much of an appetite.  She is taking some Ensure Plus.  There is no diarrhea.  There is no rashes.  She has no bleeding.  Her labs show a sodium of 134.  Potassium 4.4.  BUN 12 creatinine 1.09.  Calcium 8.2 with an albumin of 2.6.  She really has no changes on her physical exam.  She is on broad-spectrum antibiotics.  We added vancomycin yesterday.  If she continues to have temperatures, then I think the next step would be to add an antifungal agent.  I do appreciate the great care that she is getting from everybody up on 4 E.   Lattie Haw, MD  Psalm 34:4

## 2022-11-05 ENCOUNTER — Encounter: Payer: Medicare HMO | Admitting: Dietician

## 2022-11-05 DIAGNOSIS — N179 Acute kidney failure, unspecified: Secondary | ICD-10-CM | POA: Diagnosis not present

## 2022-11-05 DIAGNOSIS — D709 Neutropenia, unspecified: Secondary | ICD-10-CM | POA: Diagnosis not present

## 2022-11-05 DIAGNOSIS — R5081 Fever presenting with conditions classified elsewhere: Secondary | ICD-10-CM | POA: Diagnosis not present

## 2022-11-05 DIAGNOSIS — D61818 Other pancytopenia: Secondary | ICD-10-CM | POA: Diagnosis not present

## 2022-11-05 DIAGNOSIS — I10 Essential (primary) hypertension: Secondary | ICD-10-CM | POA: Diagnosis not present

## 2022-11-05 LAB — CULTURE, BLOOD (ROUTINE X 2)
Culture: NO GROWTH
Culture: NO GROWTH
Special Requests: ADEQUATE

## 2022-11-05 LAB — BASIC METABOLIC PANEL
Anion gap: 7 (ref 5–15)
BUN: 20 mg/dL (ref 8–23)
CO2: 24 mmol/L (ref 22–32)
Calcium: 8.3 mg/dL — ABNORMAL LOW (ref 8.9–10.3)
Chloride: 105 mmol/L (ref 98–111)
Creatinine, Ser: 1.19 mg/dL — ABNORMAL HIGH (ref 0.44–1.00)
GFR, Estimated: 45 mL/min — ABNORMAL LOW (ref >60)
Glucose, Bld: 116 mg/dL — ABNORMAL HIGH (ref 70–99)
Potassium: 4.2 mmol/L (ref 3.5–5.1)
Sodium: 136 mmol/L (ref 135–145)

## 2022-11-05 LAB — CBC WITH DIFFERENTIAL/PLATELET
Abs Immature Granulocytes: 0.02 10*3/uL (ref 0.00–0.07)
Basophils Absolute: 0.1 10*3/uL (ref 0.0–0.1)
Basophils Relative: 2 %
Eosinophils Absolute: 0.1 10*3/uL (ref 0.0–0.5)
Eosinophils Relative: 1 %
HCT: 30.4 % — ABNORMAL LOW (ref 36.0–46.0)
Hemoglobin: 9.8 g/dL — ABNORMAL LOW (ref 12.0–15.0)
Immature Granulocytes: 1 %
Lymphocytes Relative: 59 %
Lymphs Abs: 2.4 10*3/uL (ref 0.7–4.0)
MCH: 26.9 pg (ref 26.0–34.0)
MCHC: 32.2 g/dL (ref 30.0–36.0)
MCV: 83.5 fL (ref 80.0–100.0)
Monocytes Absolute: 1.2 10*3/uL — ABNORMAL HIGH (ref 0.1–1.0)
Monocytes Relative: 29 %
Neutro Abs: 0.3 10*3/uL — CL (ref 1.7–7.7)
Neutrophils Relative %: 8 %
Platelets: 100 10*3/uL — ABNORMAL LOW (ref 150–400)
RBC: 3.64 MIL/uL — ABNORMAL LOW (ref 3.87–5.11)
RDW: 22.5 % — ABNORMAL HIGH (ref 11.5–15.5)
WBC: 4 10*3/uL (ref 4.0–10.5)
nRBC: 0.7 % — ABNORMAL HIGH (ref 0.0–0.2)

## 2022-11-05 MED ORDER — STERILE WATER FOR INJECTION IJ SOLN
INTRAMUSCULAR | Status: AC
  Filled 2022-11-05: qty 10

## 2022-11-05 MED ORDER — MEGESTROL ACETATE 400 MG/10ML PO SUSP
400.0000 mg | Freq: Two times a day (BID) | ORAL | Status: DC
  Administered 2022-11-05 – 2022-11-07 (×5): 400 mg via ORAL
  Filled 2022-11-05 (×6): qty 10

## 2022-11-05 NOTE — Progress Notes (Signed)
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 Patient seen in the ED, noted to be pancytopenic and also noted to be in AKI and subsequently admitted for further evaluation and management.  She was treated with IV fluids and renal function and improved.  She started having fevers for which she was started on broad-spectrum antibiotics.  ID was consulted.  She was also started on antivirals.  Assessment & Plan:   Febrile neutropenia Pancytopenia -Oncology and ID following.  Blood cultures negative so far.  Respiratory viral PCR negative.  CT of chest abdomen and pelvis on 11/03/2022 showed small bilateral pleural effusions and bibasilar atelectasis with no focal consolidation, no acute intra-abdominal or pelvic pathology.  Was on Vanco and meropenem till today which have been discontinued by ID.  Currently also on valacyclovir. -Tmax of 102.8 over the last 24 hours. -Currently on sargramostim as per oncology.  Monitor CBC.  Mucositis/stomatitis -Possibly secondary to methotrexate toxicity in the setting of AKI. -Continue Magic mouthwash with lidocaine.   -Slowly improving  AKI superimposed on CKD stage IIIa -Resolved with hydration.  Monitor intermittently  Large granular lymphocytic leukemia -Methotrexate on hold.  Oncology following  Rheumatoid arthritis -Continue to hold leflunomide  Hypertension -ACE inhibitor will not be resumed.  Low-dose amlodipine has been started during this hospitalization which will be continued.  Monitor blood pressure  Hypothyroidism-continue Synthroid  Prediabetes -Jardiance on hold.  Outpatient  follow-up  Physical deconditioning -PT following  DVT prophylaxis: SCDs Code Status: DNR Family Communication: None at bedside Disposition Plan: Status is: Inpatient Remains inpatient appropriate because: Of severity of illness  Consultants: Oncology/ID  Procedures: None  Antimicrobials:  Anti-infectives (From admission, onward)    Start     Dose/Rate Route Frequency Ordered Stop   11/05/22 1000  valACYclovir (VALTREX) tablet 1,000 mg        1,000 mg Oral Daily 11/04/22 1121 11/06/22 2359   11/04/22 1000  vancomycin (VANCOREADY) IVPB 500 mg/100 mL  Status:  Discontinued        500 mg 100 mL/hr over 60 Minutes Intravenous Every 24 hours 11/03/22 0844 11/05/22 0922   11/03/22 1030  valACYclovir (VALTREX) tablet 1,000 mg  Status:  Discontinued        1,000 mg Oral 2 times daily 11/03/22 0935 11/04/22 1121   11/03/22 0930  vancomycin (VANCOCIN) IVPB 1000 mg/200 mL premix        1,000 mg 200 mL/hr over 60 Minutes Intravenous  Once 11/03/22 0844 11/03/22 1120   10/31/22 1000  ceFEPIme (MAXIPIME) 2 g in sodium chloride 0.9 % 100 mL IVPB  Status:  Discontinued        2 g 200 mL/hr over 30 Minutes Intravenous Every 24 hours 10/31/22 0821 10/31/22 0843   10/31/22 1000  meropenem (MERREM) 1 g in sodium chloride 0.9 % 100 mL IVPB  Status:  Discontinued        1 g 200 mL/hr over 30 Minutes Intravenous Every 12 hours 10/31/22 0844 11/05/22 0922   10/30/22 2200  acyclovir (ZOVIRAX) 480 mg in dextrose 5 % 100 mL IVPB  Status:  Discontinued        10 mg/kg  47.9 kg 109.6 mL/hr over 60 Minutes Intravenous Every 12  hours 10/30/22 1248 11/03/22 0935   10/29/22 0800  acyclovir (ZOVIRAX) 480 mg in dextrose 5 % 100 mL IVPB  Status:  Discontinued        10 mg/kg  47.9 kg 109.6 mL/hr over 60 Minutes Intravenous Every 24 hours 10/29/22 0739 10/30/22 1248        Subjective: Patient seen and examined at bedside.  Oral intake slightly improving but still not that good.  Had fever spike  yesterday.  Mouth pain improving.  No chest pain or shortness of breath reported.  Objective: Vitals:   11/04/22 1910 11/04/22 2033 11/05/22 0441 11/05/22 0854  BP:  (!) 122/53 (!) 111/58 136/62  Pulse:  (!) 102 85 87  Resp:  17 18 16   Temp: 99.8 F (37.7 C) 98.8 F (37.1 C) (!) 97.5 F (36.4 C)   TempSrc: Oral Oral Oral   SpO2:  95% 95% 97%  Weight:      Height:        Intake/Output Summary (Last 24 hours) at 11/05/2022 1046 Last data filed at 11/04/2022 1815 Gross per 24 hour  Intake 110 ml  Output 100 ml  Net 10 ml   Filed Weights   10/28/22 2147  Weight: 47.9 kg    Examination:  General exam: Appears calm and comfortable.  On room air. Respiratory system: Bilateral decreased breath sounds at bases with some scattered crackles Cardiovascular system: S1 & S2 heard, Rate controlled Gastrointestinal system: Abdomen is nondistended, soft and nontender. Normal bowel sounds heard. Extremities: No cyanosis, clubbing, edema  Central nervous system: Alert and oriented. No focal neurological deficits. Moving extremities Skin: No rashes, lesions or ulcers Psychiatry: Flat affect.  Not agitated.    Data Reviewed: I have personally reviewed following labs and imaging studies  CBC: Recent Labs  Lab 11/01/22 0532 11/02/22 0438 11/03/22 0442 11/04/22 0511 11/05/22 0507  WBC 4.0 3.1* 2.6* 3.5* 4.0  NEUTROABS 0.1* 0.1* 0.1* 0.2* 0.3*  HGB 11.7* 9.8* 10.0* 10.6* 9.8*  HCT 36.3 31.2* 31.1* 33.1* 30.4*  MCV 83.6 86.4 84.3 82.8 83.5  PLT 123* 129* 147* 138* 123XX123*   Basic Metabolic Panel: Recent Labs  Lab 10/30/22 0444 10/31/22 0438 11/01/22 0532 11/02/22 0438 11/03/22 0442 11/04/22 0511 11/05/22 0507  NA 137 134* 138 137 135 134* 136  K 3.5 4.0 4.2 4.3 3.9 4.4 4.2  CL 104 105 107 108 107 106 105  CO2 24 22 22 23 22 22 24   GLUCOSE 92 97 82 92 83 97 116*  BUN 29* 17 14 14 12 12 20   CREATININE 0.97 1.07* 0.69 0.77 0.92 1.09* 1.19*  CALCIUM 8.5* 8.1* 8.5* 8.1* 8.1*  8.2* 8.3*  MG 1.7 2.2  --  1.8 2.0 1.8  --    GFR: Estimated Creatinine Clearance: 26.1 mL/min (A) (by C-G formula based on SCr of 1.19 mg/dL (H)). Liver Function Tests: Recent Labs  Lab 10/31/22 0438 11/01/22 0532 11/02/22 0438 11/03/22 0442 11/04/22 0511  AST 34 39 29 31 44*  ALT 20 23 19 17 21   ALKPHOS 50 66 56 63 80  BILITOT 0.9 0.7 0.7 0.8 1.0  PROT 5.9* 6.6 5.4* 5.5* 5.5*  ALBUMIN 2.8* 3.0* 2.6* 2.6* 2.6*   No results for input(s): "LIPASE", "AMYLASE" in the last 168 hours. No results for input(s): "AMMONIA" in the last 168 hours. Coagulation Profile: No results for input(s): "INR", "PROTIME" in the last 168 hours. Cardiac Enzymes: No results for input(s): "CKTOTAL", "CKMB", "CKMBINDEX", "TROPONINI" in the last 168 hours. BNP (  last 3 results) No results for input(s): "PROBNP" in the last 8760 hours. HbA1C: No results for input(s): "HGBA1C" in the last 72 hours. CBG: Recent Labs  Lab 11/04/22 0716  GLUCAP 87   Lipid Profile: No results for input(s): "CHOL", "HDL", "LDLCALC", "TRIG", "CHOLHDL", "LDLDIRECT" in the last 72 hours. Thyroid Function Tests: No results for input(s): "TSH", "T4TOTAL", "FREET4", "T3FREE", "THYROIDAB" in the last 72 hours. Anemia Panel: No results for input(s): "VITAMINB12", "FOLATE", "FERRITIN", "TIBC", "IRON", "RETICCTPCT" in the last 72 hours. Sepsis Labs: No results for input(s): "PROCALCITON", "LATICACIDVEN" in the last 168 hours.  Recent Results (from the past 240 hour(s))  Culture, blood (Routine X 2) w Reflex to ID Panel     Status: None   Collection Time: 10/31/22  8:41 AM   Specimen: BLOOD  Result Value Ref Range Status   Specimen Description   Final    BLOOD BLOOD RIGHT ARM Performed at [facility] [address], [place], [place] [address]    Special Requests   Final    BOTTLES DRAWN AEROBIC ONLY Blood Culture adequate volume Performed at [facility] [address],  [place], [place] [address]    Culture   Final    NO GROWTH 5 DAYS Performed at [facility], [place] [address], [place], [place] [address]    Report Status 11/05/2022 FINAL  Final  Culture, blood (Routine X 2) w Reflex to ID Panel     Status: None   Collection Time: 10/31/22  8:43 AM   Specimen: BLOOD  Result Value Ref Range Status   Specimen Description   Final    BLOOD BLOOD RIGHT HAND Performed at [facility] [address] [place], [place] [address]    Special Requests   Final    BOTTLES DRAWN AEROBIC ONLY Blood Culture results may not be optimal due to an inadequate volume of blood received in culture bottles Performed at [facility] [address] [place], [place] [address]    Culture   Final    NO GROWTH 5 DAYS Performed at [facility], [place] [address] [place], [place] [address]    Report Status 11/05/2022 FINAL  Final  Urine Culture (for pregnant, neutropenic or urologic patients or patients with an indwelling urinary catheter)     Status: None   Collection Time: 10/31/22 10:29 AM   Specimen: Urine, Catheterized  Result Value Ref Range Status   Specimen Description   Final    URINE, CATHETERIZED Performed at [facility], [place] [address] [place], [place] [address]    Special Requests   Final    Immunocompromised Performed at [facility], [place] [address] [place], [place] [address]    Culture   Final    NO GROWTH Performed at [facility], [place] [address], [place],  [address]    Report Status 11/01/2022 FINAL  Final  Culture, blood (Routine X 2) w Reflex to ID Panel     Status: None (Preliminary result)   Collection Time: 11/03/22  7:23 AM   Specimen: BLOOD  Result Value Ref Range Status   Specimen Description   Final    BLOOD BLOOD RIGHT ARM AEROBIC BOTTLE ONLY Performed at [facility] [address], [place],  [address]    Special  Requests   Final    BOTTLES DRAWN AEROBIC ONLY Blood Culture results may not be optimal due to an  inadequate volume of blood received in culture bottles Performed at [facility] [address] [place], [place] [address]    Culture   Final    NO GROWTH 2 DAYS Performed at [facility] [address] [place], [place] [address]    Report Status PENDING  Incomplete  Culture, blood (Routine X 2) w Reflex to ID Panel     Status: None (Preliminary result)   Collection Time: 11/03/22  7:23 AM   Specimen: BLOOD  Result Value Ref Range Status   Specimen Description   Final    BLOOD BLOOD RIGHT HAND AEROBIC BOTTLE ONLY Performed at [facility] [address] [place], [place] [address]    Special Requests   Final    BOTTLES DRAWN AEROBIC ONLY Blood Culture adequate volume Performed at [facility] [address], [place], [place] [address]    Culture   Final    NO GROWTH 2 DAYS Performed at [facility] [address], [place], [place] [address]    Report Status PENDING  Incomplete  Respiratory (~20 pathogens) panel by PCR     Status: None   Collection Time: 11/03/22  4:07 PM   Specimen: Nasopharyngeal Swab; Respiratory  Result Value Ref Range Status   Adenovirus NOT DETECTED NOT DETECTED Final   Coronavirus 229E NOT DETECTED NOT DETECTED Final    Comment: (NOTE) The Coronavirus on the Respiratory Panel, DOES NOT test for the novel  Coronavirus (2019 nCoV)    Coronavirus HKU1 NOT DETECTED NOT DETECTED Final   Coronavirus NL63 NOT DETECTED NOT DETECTED Final   Coronavirus OC43 NOT DETECTED NOT DETECTED Final   Metapneumovirus NOT DETECTED NOT DETECTED Final   Rhinovirus / Enterovirus NOT DETECTED NOT DETECTED Final   Influenza A NOT DETECTED NOT DETECTED Final   Influenza B NOT DETECTED NOT DETECTED Final   Parainfluenza Virus 1 NOT DETECTED NOT DETECTED Final   Parainfluenza Virus 2 NOT DETECTED NOT  DETECTED Final   Parainfluenza Virus 3 NOT DETECTED NOT DETECTED Final   Parainfluenza Virus 4 NOT DETECTED NOT DETECTED Final   Respiratory Syncytial Virus NOT DETECTED NOT DETECTED Final   Bordetella pertussis NOT DETECTED NOT DETECTED Final   Bordetella Parapertussis NOT DETECTED NOT DETECTED Final   Chlamydophila pneumoniae NOT DETECTED NOT DETECTED Final   Mycoplasma pneumoniae NOT DETECTED NOT DETECTED Final    Comment: Performed at [facility], [place]. [address], [place], [place] [address]         Radiology Studies: CT CHEST ABDOMEN PELVIS W CONTRAST  Result Date: 11/03/2022 CLINICAL DATA:  Neutropenic fever. EXAM: CT CHEST, ABDOMEN, AND PELVIS WITH CONTRAST TECHNIQUE: Multidetector CT imaging of the chest, abdomen and pelvis was performed following the standard protocol during bolus administration of intravenous contrast. RADIATION DOSE REDUCTION: This exam was performed according to the departmental dose-optimization program which includes automated exposure control, adjustment of the mA and/or kV according to patient size and/or use of iterative reconstruction technique. CONTRAST:  88mL OMNIPAQUE IOHEXOL 300 MG/ML  SOLN COMPARISON:  Chest radiograph dated 10/31/2022. Chest CT dated 09/24/2022. FINDINGS: CT CHEST FINDINGS Cardiovascular: There is no cardiomegaly or pericardial effusion. Coronary vascular calcification of the LAD. Mild atherosclerotic calcification of the thoracic aorta. No aneurysmal dilatation or dissection. The origins of the great vessels of the aortic arch and the central pulmonary arteries appear patent. Mediastinum/Nodes: No hilar or mediastinal adenopathy. The esophagus is grossly unremarkable. No mediastinal fluid collection. Lungs/Pleura:  Small bilateral pleural effusions and bibasilar atelectasis. No focal consolidation, or pneumothorax. The central airways are patent. Musculoskeletal: Degenerative changes of the spine. No acute osseous pathology. CT ABDOMEN  PELVIS FINDINGS No intra-abdominal free air or free fluid. Hepatobiliary: Small cyst in the right lobe of the liver as well as additional subcentimeter hypodense lesions which are too small to characterize. There is mild biliary dilatation, post cholecystectomy. Pancreas: Unremarkable. No pancreatic ductal dilatation or surrounding inflammatory changes. Spleen: Normal in size without focal abnormality. Adrenals/Urinary Tract: The adrenal glands are unremarkable. There is no hydronephrosis on either side. There is symmetric enhancement and excretion of contrast by both kidneys. The visualized ureters and the urinary bladder appear unremarkable. Stomach/Bowel: There is no bowel obstruction or active inflammation. The appendix is not visualized with certainty. No inflammatory changes identified in the right lower quadrant. Vascular/Lymphatic: Mild aortoiliac atherosclerotic disease. The IVC is unremarkable. No portal venous gas. There is no adenopathy. Reproductive: Hysterectomy.  No adnexal masses. Other: None Musculoskeletal: Degenerative changes of the spine. No acute osseous pathology. IMPRESSION: 1. Small bilateral pleural effusions and bibasilar atelectasis. No focal consolidation. 2. No acute intra-abdominal or pelvic pathology. 3.  Aortic Atherosclerosis (ICD10-I70.0). Electronically Signed   By: Anner Crete M.D.   On: 11/03/2022 21:22        Scheduled Meds:  acetaminophen  650 mg Oral Once   amLODipine  2.5 mg Oral Daily   antiseptic oral rinse  15 mL Mouth Rinse Q6H   feeding supplement  237 mL Oral BID BM   folic acid  1 mg Oral Daily   influenza vaccine adjuvanted  0.5 mL Intramuscular Tomorrow-1000   levothyroxine  75 mcg Oral Q0600   magic mouthwash w/lidocaine  10 mL Oral QID   megestrol  400 mg Oral BID   polyethylene glycol  17 g Oral Daily   sargramostim  500 mcg Subcutaneous Daily   senna-docusate  1 tablet Oral BID   valACYclovir  1,000 mg Oral Daily   Continuous  Infusions:        Aline August, MD [facility] 11/05/2022, 10:46 AM

## 2022-11-05 NOTE — NC FL2 (Signed)
MEDICAID FL2 LEVEL OF CARE FORM     IDENTIFICATION  Patient Name: [name] [name] Birthdate: Nov 15, 1934 Sex: female Admission Date (Current Location): 10/28/2022  [facility] and [place] Number:  Herbalist and Address:  [facility],  [place], [place]      Provider Number: [id number]  Attending Physician Name and Address:  Aline August, MD  Relative Name and Phone Number:   [name] Digangi([phone]    Current Level of Care: Hospital Recommended Level of Care: [place] Prior Approval Number:    Date Approved/Denied:   PASRR Number:  ([id number])  Discharge Plan: SNF    Current Diagnoses: Patient Active Problem List   Diagnosis Date Noted   Aphthous ulcer 11/04/2022   Neutropenic fever 10/31/2022   Dehydration 10/29/2022   AKI (acute kidney injury) 10/28/2022   Pancytopenia 10/28/2022   Stomatitis 10/28/2022   Pre-diabetes 10/28/2022   Protein-calorie malnutrition, severe 09/25/2022   Acute diarrhea 09/24/2022   Unintentional weight loss 09/24/2022   Impaired mental alertness 09/24/2022   Acute renal failure superimposed on stage 3a chronic kidney disease 09/24/2022   Need for RSV immunization 04/23/2022   Prediabetes 04/23/2022   Large granular lymphocytic leukemia 08/06/2021   Goals of care, counseling/discussion 08/06/2021   Need for influenza vaccination 04/18/2021   Iron deficiency anemia 11/20/2020   Lymphocytosis 10/16/2020   Neutropenia 10/16/2020   Paroxysmal supraventricular tachycardia 10/15/2020   Elevated LDL cholesterol level 01/12/2020   DDD (degenerative disc disease), cervical 07/13/2018   Chronic kidney disease (CKD) stage G3b 01/21/2017   History of hypothyroidism 01/20/2017   History of congestive heart failure 08/26/2016   Osteopenia of multiple sites 08/25/2016   DDD cervical spine 08/25/2016   Primary osteoarthritis of both hands 08/25/2016   High risk  medication use 06/11/2016   CHF, acute 02/01/2013   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   Hypothyroidism 01/19/2007   Dyslipidemia 01/19/2007   Essential hypertension 01/19/2007   Rheumatoid arthritis 01/19/2007    Orientation RESPIRATION BLADDER Height & Weight     Self, Time, Situation, Place  Normal Continent Weight: 47.9 kg Height:  5\' 1"  (154.9 cm)  BEHAVIORAL SYMPTOMS/MOOD NEUROLOGICAL BOWEL NUTRITION STATUS      Continent Diet (Regular)  AMBULATORY STATUS COMMUNICATION OF NEEDS Skin   Limited Assist Verbally Normal                       Personal Care Assistance Level of Assistance  Bathing, Feeding, Dressing Bathing Assistance: Limited assistance Feeding assistance: Limited assistance Dressing Assistance: Limited assistance     Functional Limitations Info  Sight, Hearing, Speech Sight Info: Impaired (eyeglasses) Hearing Info: Adequate Speech Info: Adequate    SPECIAL CARE FACTORS FREQUENCY  PT (By licensed PT), OT (By licensed OT)     PT Frequency:  (5x week) OT Frequency:  (5x week)            Contractures Contractures Info: Not present    Additional Factors Info  Code Status, Allergies Code Status Info:  (DNR) Allergies Info:  (Tramadol/codeine phosphate)           Current Medications (11/05/2022):  This is the current hospital active medication list Current Facility-Administered Medications  Medication Dose Route Frequency Provider Last Rate Last Admin   acetaminophen (TYLENOL) tablet 650 mg  650 mg Oral Q6H PRN Tu, Ching T, DO   650 mg at 11/04/22 1807   acetaminophen (TYLENOL) tablet 650 mg  650  mg Oral Once Eugenie Filler, MD       amLODipine (NORVASC) tablet 2.5 mg  2.5 mg Oral Daily Eugenie Filler, MD   2.5 mg at 11/05/22 0856   antiseptic oral rinse (BIOTENE) solution 15 mL  15 mL Mouth Rinse Q6H Volanda Napoleon, MD   15 mL at 11/05/22 0554   feeding supplement (ENSURE ENLIVE / ENSURE PLUS) liquid 237 mL  237 mL Oral BID BM Tu,  Ching T, DO   237 mL at [id number]   folic acid (FOLVITE) tablet 1 mg  1 mg Oral Daily Tu, Ching T, DO   1 mg at 11/05/22 0856   influenza vaccine adjuvanted (FLUAD) injection 0.5 mL  0.5 mL Intramuscular Tomorrow-1000 Tu, Ching T, DO       levothyroxine (SYNTHROID) tablet 75 mcg  75 mcg Oral Q0600 Tu, Ching T, DO   75 mcg at 11/05/22 0554   magic mouthwash w/lidocaine  10 mL Oral QID Eugenie Filler, MD   10 mL at 11/05/22 0900   megestrol ([name]) 400 MG/10ML suspension 400 mg  400 mg Oral BID Volanda Napoleon, MD   400 mg at 11/05/22 0855   ondansetron (ZOFRAN) injection 4 mg  4 mg Intravenous Q6H PRN Volanda Napoleon, MD       polyethylene glycol (MIRALAX / GLYCOLAX) packet 17 g  17 g Oral Daily Eugenie Filler, MD       sargramostim (LEUKINE) injection 500 mcg  500 mcg Subcutaneous Daily Volanda Napoleon, MD   500 mcg at 11/05/22 [id number]   senna-docusate (Senokot-S) tablet 1 tablet  1 tablet Oral BID Eugenie Filler, MD   1 tablet at 11/05/22 [id number]   valACYclovir (VALTREX) tablet 1,000 mg  1,000 mg Oral Daily Vu, Trung T, MD   1,000 mg at 11/05/22 [id number]     Discharge Medications: Please see discharge summary for a list of discharge medications.  Relevant Imaging Results:  Relevant Lab Results:   Additional Information  (ss#[phone];current.y on leukine/sargramostin 536mcg sq qd(not sure if will be on @ d/c))  Westen Dinino, Juliann Pulse, RN

## 2022-11-05 NOTE — TOC Progression Note (Signed)
Transition of Care [facility]) - Progression Note    Patient Details  Name: [name] [name] MRN: [id number] Date of Birth: 01/14/1935  Transition of Care [facility]) CM/SW Contact  Chosen Geske, Juliann Pulse, RN Phone Number: 11/05/2022, 11:36 AM  Clinical Narrative:  Noted PT recc ST SNF-Todd(Son) agree to fax out for ST SNF-await bed offers.    Expected Discharge Plan: [place] Barriers to Discharge: Continued Medical Work up  Expected Discharge Plan and Services   Discharge Planning Services: CM Consult Post Acute Care Choice: [place] arrangements for the past 2 months: Single Family Home                                       Social Determinants of Health (SDOH) Interventions SDOH Screenings   Food Insecurity: No Food Insecurity (10/28/2022)  Housing: Low Risk  (10/29/2022)  Transportation Needs: No Transportation Needs (10/29/2022)  Utilities: Not At Risk (10/29/2022)  Alcohol Screen: Low Risk  (07/31/2021)  Depression (PHQ2-9): Low Risk  (08/08/2022)  Financial Resource Strain: Low Risk  (08/08/2022)  Physical Activity: Insufficiently Active (08/08/2022)  Social Connections: Moderately Isolated (07/31/2021)  Stress: No Stress Concern Present (08/08/2022)  Tobacco Use: Low Risk  (10/29/2022)    Readmission Risk Interventions    11/04/2022    3:46 PM 09/26/2022   10:27 AM  Readmission Risk Prevention Plan  Transportation Screening Complete Complete  PCP or Specialist Appt within 5-7 Days  Complete  PCP or Specialist Appt within 3-5 Days Complete   Home Care Screening  Complete  Medication Review (RN CM)  Complete  HRI or [place] Complete   Social Work Consult for [place] Planning/Counseling Complete   Palliative Care Screening Complete   Medication Review Press photographer) Complete

## 2022-11-05 NOTE — Progress Notes (Signed)
Physical Therapy Treatment Patient Details Name: [name] [name] MRN: [id number] DOB: May 18, 1935 Today's Date: 11/05/2022   History of Present Illness Patient is a 88 year old female who presented with oral ulcers and decreased oral intake.patient was admitted with acute renal failure, neutropenic fever,and mucositis on 10/28/22. 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    PT Comments    Pt reports feeling better today. She appears to be doing better today compared to yesterday's notes. Min guard to ambulate to bathroom then 40 feet in hallway. No LOB. Pt does fatigue easily. She requires some encouragement to progress mobility. Son present during session. Will continue to follow.     Recommendations for follow up therapy are one component of a multi-disciplinary discharge planning process, led by the attending physician.  Recommendations may be updated based on patient status, additional functional criteria and insurance authorization.  Follow Up Recommendations       Assistance Recommended at Discharge Frequent or constant Supervision/Assistance  Patient can return home with the following Assist for transportation;Help with stairs or ramp for entrance;Assistance with cooking/housework;A little help with walking and/or transfers;A little help with bathing/dressing/bathroom   Equipment Recommendations  Rolling walker (2 wheels)    Recommendations for Other Services       Precautions / Restrictions Precautions Precautions: Fall Restrictions Weight Bearing Restrictions: No     Mobility  Bed Mobility Overal bed mobility: Modified Independent       Supine to sit: HOB elevated, Modified independent (Device/Increase time) Sit to supine: HOB elevated, Modified independent (Device/Increase time)   General bed mobility comments: increased time.    Transfers Overall transfer level: Needs assistance Equipment used: Rolling walker (2 wheels) Transfers: Sit  to/from Stand Sit to Stand: Supervision           General transfer comment: Supv for safety. Cues for hand placement. Increased time.    Ambulation/Gait Ambulation/Gait assistance: Min guard Gait Distance (Feet): 40 Feet Assistive device: Rolling walker (2 wheels) Gait Pattern/deviations: Step-through pattern, Decreased stride length       General Gait Details: Slow gait speed. Fatigues easily. No LOB.   Stairs             Wheelchair Mobility    Modified Rankin (Stroke Patients Only)       Balance Overall balance assessment: Needs assistance           Standing balance-Leahy Scale: Fair Standing balance comment: Pt was able to take steps over to sink from toilet with UE support-no LOB. Turned around to grab RW-no LOB                            Cognition Arousal/Alertness: Awake/alert Behavior During Therapy: WFL for tasks assessed/performed Overall Cognitive Status: Within Functional Limits for tasks assessed                                 General Comments: some short term memory deficits        Exercises      General Comments        Pertinent Vitals/Pain Pain Assessment Pain Assessment: Faces Faces Pain Scale: Hurts little more Pain Location: mouth Pain Descriptors / Indicators: Discomfort Pain Intervention(s): Monitored during session    Home Living  Prior Function            PT Goals (current goals can now be found in the care plan section) Progress towards PT goals: Progressing toward goals    Frequency    Min 3X/week      PT Plan Current plan remains appropriate    Co-evaluation              AM-PAC PT "6 Clicks" Mobility   Outcome Measure  Help needed turning from your back to your side while in a flat bed without using bedrails?: None Help needed moving from lying on your back to sitting on the side of a flat bed without using bedrails?: None Help  needed moving to and from a bed to a chair (including a wheelchair)?: A Little Help needed standing up from a chair using your arms (e.g., wheelchair or bedside chair)?: A Little Help needed to walk in hospital room?: A Little Help needed climbing 3-5 steps with a railing? : A Lot 6 Click Score: 19    End of Session Equipment Utilized During Treatment: Gait belt Activity Tolerance: Patient limited by fatigue Patient left: in bed;with call bell/phone within reach;with bed alarm set;with family/visitor present   PT Visit Diagnosis: Muscle weakness (generalized) (M62.81)     Time: [id number] PT Time Calculation (min) (ACUTE ONLY): 19 min  Charges:  $Gait Training: 8-22 mins                         Doreatha Massed, PT Acute Rehabilitation  Office: [phone]

## 2022-11-05 NOTE — Progress Notes (Signed)
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 However is not hemodynamically disturbed or exhibiting sepsis physiology   Neupogen started but no improvement in neutrophil with severe chronic neutropenia. 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 It seems chronic anemia/neutropenia without significant change in cbc profile since her last bone marrow biopsy late 2022       Id w/u: 3/29 chest xray 3/29 bcx ngtd 3/29 ucx ngtd 4/01 bcx ngtd 4/01 crypto ag serum negative 4/01 respiratory viral pcr negative 4/01 fungitell in process 4/01 urine histo ag in process 4/01 aspergillus GM ordered, not yet sent  Imaging: 4/01 panbody ct -- no new or worsening or mention  of pulm nodules as previously seen on 09/2022 chest ct. No other acute intraabd process. Small bilateral pleural effusion  -------------- 4/03 assessment Ongoing fever No new sx   Continued slow improvement in wbc/neutrophil with gm-csf   I do not see microbiological/imaging/clinical suggestion of an infectious etiology. I suspect this is either related to an autoimmune process with her neutropenia/ulcer. There was question of methotrexate suppressing her marrow although it is rather low dose and she has had chronic neutropenia for several years.   I would stop empiric antibiotics and monitor. Wouldn't restart unless hemodynamic compromise. There is no role for empiric antifungal in this patient (candida bsi, or invasive aspergillosis would be expectation but as above we have not seen evidence of)    Plan: F/u asp gm Stop vanc and meropenem; no role for empiric antifungal at this time Reasonable to finish 7 day acyclovir/valacyclovir Monitor; wouldn't add back antimicrobial unless hemodynamic compromise or evidence of true infection clinically based on culture/symptoms/imaging Will see once again this week Dr Juleen China back tomorrow/Friday Discussed plan/result/labs/imaging with son/brother/patient Discussed with primary team   I spent more than 50 minute reviewing data/chart, and coordinating care and >50% direct face to face time providing counseling/discussing diagnostics/treatment plan with patient    Principal Problem:   Acute renal failure superimposed on stage 3a chronic kidney disease Active  Problems:   Hypothyroidism   Essential hypertension   Rheumatoid arthritis   Neutropenia   Iron deficiency anemia   Large granular lymphocytic leukemia   AKI (acute kidney injury)   Pancytopenia   Stomatitis   Pre-diabetes   Dehydration   Neutropenic fever   Aphthous ulcer   Allergies  Allergen Reactions   Tramadol Nausea Only   Codeine Phosphate Nausea Only    Scheduled  Meds:  acetaminophen  650 mg Oral Once   amLODipine  2.5 mg Oral Daily   antiseptic oral rinse  15 mL Mouth Rinse Q6H   feeding supplement  237 mL Oral BID BM   folic acid  1 mg Oral Daily   influenza vaccine adjuvanted  0.5 mL Intramuscular Tomorrow-1000   levothyroxine  75 mcg Oral Q0600   magic mouthwash w/lidocaine  10 mL Oral QID   megestrol  400 mg Oral BID   polyethylene glycol  17 g Oral Daily   sargramostim  500 mcg Subcutaneous Daily   senna-docusate  1 tablet Oral BID   valACYclovir  1,000 mg Oral Daily   Continuous Infusions:  meropenem (MERREM) IV 1 g (11/04/22 2144)   vancomycin 500 mg (11/04/22 1130)   PRN Meds:.acetaminophen, ondansetron (ZOFRAN) IV   SUBJECTIVE: Sore feels better in her mouth No headache, sorethroat, n/v/diarrhea, rash, abd pain/chest pain  Fever still but appears the curve is improving  Chest ct reviewed    Review of Systems: ROS All other ROS was negative, except mentioned above     OBJECTIVE: Vitals:   11/04/22 1910 11/04/22 2033 11/05/22 0441 11/05/22 0854  BP:  (!) 122/53 (!) 111/58 136/62  Pulse:  (!) 102 85 87  Resp:  17 18 16   Temp: 99.8 F (37.7 C) 98.8 F (37.1 C) (!) 97.5 F (36.4 C)   TempSrc: Oral Oral Oral   SpO2:  95% 95% 97%  Weight:      Height:       Body mass index is 19.97 kg/m.  Physical Exam General/constitutional: no distress, pleasant HEENT: Normocephalic, PER, Conj Clear, EOMI, Oropharynx with stable size oral ulcer right buccal mucosa Neck supple CV: rrr no mrg Lungs: clear to auscultation, normal respiratory effort Abd: Soft, Nontender Ext: no edema Skin: No Rash Neuro: nonfocal MSK: no peripheral joint swelling/tenderness/warmth; back spines nontender      Lab Results Lab Results  Component Value Date   WBC 4.0 11/05/2022   HGB 9.8 (L) 11/05/2022   HCT 30.4 (L) 11/05/2022   MCV 83.5 11/05/2022   PLT 100 (L) 11/05/2022    Lab Results  Component Value Date   CREATININE 1.19  (H) 11/05/2022   BUN 20 11/05/2022   NA 136 11/05/2022   K 4.2 11/05/2022   CL 105 11/05/2022   CO2 24 11/05/2022    Lab Results  Component Value Date   ALT 21 11/04/2022   AST 44 (H) 11/04/2022   ALKPHOS 80 11/04/2022   BILITOT 1.0 11/04/2022      Microbiology: Recent Results (from the past 240 hour(s))  Culture, blood (Routine X 2) w Reflex to ID Panel     Status: None   Collection Time: 10/31/22  8:41 AM   Specimen: BLOOD  Result Value Ref Range Status   Specimen Description   Final    BLOOD BLOOD RIGHT ARM Performed at [facility], [place] [address], [place], [place] [address]    Special Requests   Final    BOTTLES DRAWN AEROBIC  ONLY Blood Culture adequate volume Performed at [facility] [address], [place], [place] [address]    Culture   Final    NO GROWTH 5 DAYS Performed at [facility], [place] [address] [place], [place] [address]    Report Status 11/05/2022 FINAL  Final  Culture, blood (Routine X 2) w Reflex to ID Panel     Status: None   Collection Time: 10/31/22  8:43 AM   Specimen: BLOOD  Result Value Ref Range Status   Specimen Description   Final    BLOOD BLOOD RIGHT HAND Performed at [facility] [address], [place], [place] [address]    Special Requests   Final    BOTTLES DRAWN AEROBIC ONLY Blood Culture results may not be optimal due to an inadequate volume of blood received in culture bottles Performed at [facility] [address], [place], [place] [address]    Culture   Final    NO GROWTH 5 DAYS Performed at [facility], [place] [address], [place], [place] [address]    Report Status 11/05/2022 FINAL  Final  Urine Culture (for pregnant, neutropenic or urologic patients or patients with an indwelling urinary catheter)     Status: None   Collection Time: 10/31/22 10:29 AM   Specimen: Urine, Catheterized  Result Value Ref Range Status   Specimen  Description   Final    URINE, CATHETERIZED Performed at [facility], [place] [address] [place], [place] [address]    Special Requests   Final    Immunocompromised Performed at [facility], [place] [address], [place], [place] [address]    Culture   Final    NO GROWTH Performed at [facility], [place] [address], [place], [place] [address]    Report Status 11/01/2022 FINAL  Final  Culture, blood (Routine X 2) w Reflex to ID Panel     Status: None (Preliminary result)   Collection Time: 11/03/22  7:23 AM   Specimen: BLOOD  Result Value Ref Range Status   Specimen Description   Final    BLOOD BLOOD RIGHT ARM AEROBIC BOTTLE ONLY Performed at [facility] [address], [place], [place] [address]    Special Requests   Final    BOTTLES DRAWN AEROBIC ONLY Blood Culture results may not be optimal due to an inadequate volume of blood received in culture bottles Performed at [facility] [address] [place], [place] [address]    Culture   Final    NO GROWTH 2 DAYS Performed at [facility] [address] [place], [place] [address]    Report Status PENDING  Incomplete  Culture, blood (Routine X 2) w Reflex to ID Panel     Status: None (Preliminary result)   Collection Time: 11/03/22  7:23 AM   Specimen: BLOOD  Result Value Ref Range Status   Specimen Description   Final    BLOOD BLOOD RIGHT HAND AEROBIC BOTTLE ONLY Performed at [facility] [address], [place], [place] [address]    Special Requests   Final    BOTTLES DRAWN AEROBIC ONLY Blood Culture adequate volume Performed at [facility] [address], [place], [place] [address]    Culture   Final    NO GROWTH 2 DAYS Performed at [facility] [address], [place],  [address]  Report Status PENDING  Incomplete  Respiratory (~20 pathogens) panel by PCR     Status: None    Collection Time: 11/03/22  4:07 PM   Specimen: Nasopharyngeal Swab; Respiratory  Result Value Ref Range Status   Adenovirus NOT DETECTED NOT DETECTED Final   Coronavirus 229E NOT DETECTED NOT DETECTED Final    Comment: (NOTE) The Coronavirus on the Respiratory Panel, DOES NOT test for the novel  Coronavirus (2019 nCoV)    Coronavirus HKU1 NOT DETECTED NOT DETECTED Final   Coronavirus NL63 NOT DETECTED NOT DETECTED Final   Coronavirus OC43 NOT DETECTED NOT DETECTED Final   Metapneumovirus NOT DETECTED NOT DETECTED Final   Rhinovirus / Enterovirus NOT DETECTED NOT DETECTED Final   Influenza A NOT DETECTED NOT DETECTED Final   Influenza B NOT DETECTED NOT DETECTED Final   Parainfluenza Virus 1 NOT DETECTED NOT DETECTED Final   Parainfluenza Virus 2 NOT DETECTED NOT DETECTED Final   Parainfluenza Virus 3 NOT DETECTED NOT DETECTED Final   Parainfluenza Virus 4 NOT DETECTED NOT DETECTED Final   Respiratory Syncytial Virus NOT DETECTED NOT DETECTED Final   Bordetella pertussis NOT DETECTED NOT DETECTED Final   Bordetella Parapertussis NOT DETECTED NOT DETECTED Final   Chlamydophila pneumoniae NOT DETECTED NOT DETECTED Final   Mycoplasma pneumoniae NOT DETECTED NOT DETECTED Final    Comment: Performed at [facility], [address] [place], [place] [address]     Serology:   Imaging: If present, new imagings (plain films, ct scans, and mri) have been personally visualized and interpreted; radiology reports have been reviewed. Decision making incorporated into the Impression / Recommendations.   11/04/22 chest abd pelv ct 1. Small bilateral pleural effusions and bibasilar atelectasis. No focal consolidation. 2. No acute intra-abdominal or pelvic pathology. 3.  Aortic Atherosclerosis  Jabier Mutton, [facility] for Infectious [place] [phone] pager    11/05/2022, 9:11 AM

## 2022-11-05 NOTE — Progress Notes (Signed)
[name] [name] still is having temperature spikes.  She was up to 102.5 yesterday.  Her white cell count is slowly trending up.  This morning, her neutrophil count is 300.  She continues on the GM-CSF.  She is not eating much.  She has no appetite.  I will try her on some [name] elixir to see if this may help.  The ulcer in her mouth does seem to be improving a little bit.  She does not complain of much pain from the ulcer.  To me, I think her nutritional state is good to be a factor.  We really need to get her to taking a little bit more.  I realize that she is quite mature and that she is somewhat on the more frail side.  She has had no bleeding.  There has been no diarrhea.  She has had no cough or shortness of breath.  She has had no rashes.  There has been no headache.  Her vital signs show temperature 97.5.  Pulse 85.  Blood pressure 111/58.  Her head and neck exam shows the ulceration on the right buccal mucosa.  This does seem to be improving.  There is no adenopathy in the neck.  Lungs are clear bilaterally.  Cardiac exam regular rate and rhythm.  She has 1/6 systolic ejection murmur.  Abdomen is soft.  Bowel sounds are present.  There is no fluid wave.  There is no palpable liver or spleen tip.  Extremity shows no clubbing, cyanosis or edema.  Neurological exam is nonfocal.  Skin exam shows no rashes, ecchymosis or petechia.  [name] [name] is still dealing with the neutropenia and fever.  Again, cultures are all negative.  She is on antibiotics with vancomycin/Merrem/acyclovir.  The white cell count is slowly trending upward.  We will continue the GM-CSF.  We may still need to add an antifungal if she continues to have temperature spikes.  Hopefully, [name] will help with the appetite stimulation.  I do appreciate the great care that the staff on 4 E. are given her.  Lattie Haw, MD  [name] 6:9

## 2022-11-06 ENCOUNTER — Telehealth: Payer: Self-pay | Admitting: Family Medicine

## 2022-11-06 DIAGNOSIS — K121 Other forms of stomatitis: Secondary | ICD-10-CM | POA: Diagnosis not present

## 2022-11-06 DIAGNOSIS — D61818 Other pancytopenia: Secondary | ICD-10-CM | POA: Diagnosis not present

## 2022-11-06 DIAGNOSIS — N179 Acute kidney failure, unspecified: Secondary | ICD-10-CM | POA: Diagnosis not present

## 2022-11-06 DIAGNOSIS — C91Z0 Other lymphoid leukemia not having achieved remission: Secondary | ICD-10-CM | POA: Diagnosis not present

## 2022-11-06 LAB — CBC WITH DIFFERENTIAL/PLATELET
Abs Immature Granulocytes: 0.02 10*3/uL (ref 0.00–0.07)
Basophils Absolute: 0.1 10*3/uL (ref 0.0–0.1)
Basophils Relative: 2 %
Eosinophils Absolute: 0 10*3/uL (ref 0.0–0.5)
Eosinophils Relative: 1 %
HCT: 30.7 % — ABNORMAL LOW (ref 36.0–46.0)
Hemoglobin: 9.9 g/dL — ABNORMAL LOW (ref 12.0–15.0)
Immature Granulocytes: 0 %
Lymphocytes Relative: 58 %
Lymphs Abs: 2.8 10*3/uL (ref 0.7–4.0)
MCH: 26.8 pg (ref 26.0–34.0)
MCHC: 32.2 g/dL (ref 30.0–36.0)
MCV: 83 fL (ref 80.0–100.0)
Monocytes Absolute: 1 10*3/uL (ref 0.1–1.0)
Monocytes Relative: 21 %
Neutro Abs: 0.9 10*3/uL — ABNORMAL LOW (ref 1.7–7.7)
Neutrophils Relative %: 18 %
Platelets: 83 10*3/uL — ABNORMAL LOW (ref 150–400)
RBC: 3.7 MIL/uL — ABNORMAL LOW (ref 3.87–5.11)
RDW: 23 % — ABNORMAL HIGH (ref 11.5–15.5)
WBC: 4.7 10*3/uL (ref 4.0–10.5)
nRBC: 0.8 % — ABNORMAL HIGH (ref 0.0–0.2)

## 2022-11-06 LAB — BASIC METABOLIC PANEL
Anion gap: 5 (ref 5–15)
BUN: 25 mg/dL — ABNORMAL HIGH (ref 8–23)
CO2: 23 mmol/L (ref 22–32)
Calcium: 8.1 mg/dL — ABNORMAL LOW (ref 8.9–10.3)
Chloride: 107 mmol/L (ref 98–111)
Creatinine, Ser: 0.91 mg/dL (ref 0.44–1.00)
GFR, Estimated: >60 mL/min (ref >60)
Glucose, Bld: 112 mg/dL — ABNORMAL HIGH (ref 70–99)
Potassium: 3.9 mmol/L (ref 3.5–5.1)
Sodium: 135 mmol/L (ref 135–145)

## 2022-11-06 LAB — MAGNESIUM: Magnesium: 2 mg/dL (ref 1.7–2.4)

## 2022-11-06 MED ORDER — STERILE WATER FOR INJECTION IJ SOLN
INTRAMUSCULAR | Status: AC
  Administered 2022-11-06: 10 mL
  Filled 2022-11-06: qty 10

## 2022-11-06 NOTE — Progress Notes (Addendum)
Finally, her neutrophils are slightly come up.  The Leukine is working.  Her ANC is now 900.  Her temperature curve is coming down.  The ulcer in her mouth is healing up.  She is off IV antibiotics.  She still on Valtrex.  She is still not eating much.  We started her on [name] yesterday.  We really need to get her to eat a little bit better.  She is getting out of bed and sitting in the chair.  She had labs this morning.  Her sodium 135.  Potassium 3.9.  BUN 25 creatinine 0.91.  Calcium 8.1.  Her white count is 4.7.  Hemoglobin 9.9.  Platelet count 83,000.  I think that some of the lower platelets might be from the Lupron itself.  Her vital signs are temperature 97.5.  Pulse 96.  Blood pressure 126/59.  Her head and neck exam shows nice healing of the oral ulcer on the right buccal mucosa.  There is no adenopathy in the neck.  Lungs are clear bilaterally.  Cardiac exam regular rate and rhythm.  Abdomen soft.  Bowel sounds are present.  There is no fluid wave.  Extremity shows no clubbing, cyanosis or edema.  Neurological exam is nonfocal.  I am just happy that her neutrophils are starting to come up.  Again, the GM-CSF is finally working.  With her high monocytes, hopefully this will mean that the neutrophils will continue to rise.  It would be nice to get her neutrophils above 1500 before the Leukine is stopped.  I think the real question then would be how quickly the neutrophils will start to drop.  At least, we will get her immune system back to where it needs to be.  Hopefully, she will eat better.  We have her on the [name] elixir.  I think she can eat better, this will also improve her immune system.  I do appreciate everybody's help on 4 E.   Lattie Haw, MD  Darlyn Chamber 17:14

## 2022-11-06 NOTE — Progress Notes (Signed)
Mobility Specialist - Progress Note   11/06/22 1003  Mobility  Activity Ambulated with assistance in hallway  Level of Assistance Standby assist, set-up cues, supervision of patient - no hands on  Assistive Device Cane  Distance Ambulated (ft) 250 ft  Activity Response Tolerated well  Mobility Referral Yes  $Mobility charge 1 Mobility   Pt received in bed and agreeable to mobility. No complaints during session. Pt to recliner after session with all needs met.    [facility]

## 2022-11-06 NOTE — TOC Progression Note (Signed)
Transition of Care [facility]) - Progression Note    Patient Details  Name: [name] [name] MRN: [id number] Date of Birth: [birth year]  Transition of Care [facility]) CM/SW Contact  Lateefa Crosby, Juliann Pulse, RN Phone Number: 11/06/2022, 3:13 PM  Clinical Narrative:  Damaris Schooner to Todd(son) about d/c plans-he prefers to discuss w/spouse about [facility] already following would recc HHPT/OT/csw.     Expected Discharge Plan: [place] Barriers to Discharge: Continued Medical Work up  Expected Discharge Plan and Services   Discharge Planning Services: CM Consult Post Acute Care Choice: [place] arrangements for the past 2 months: Single Family Home                                       Social Determinants of Health (SDOH) Interventions SDOH Screenings   Food Insecurity: No Food Insecurity (10/28/2022)  Housing: Low Risk  (10/29/2022)  Transportation Needs: No Transportation Needs (10/29/2022)  Utilities: Not At Risk (10/29/2022)  Alcohol Screen: Low Risk  (07/31/2021)  Depression (PHQ2-9): Low Risk  (08/08/2022)  Financial Resource Strain: Low Risk  (08/08/2022)  Physical Activity: Insufficiently Active (08/08/2022)  Social Connections: Moderately Isolated (07/31/2021)  Stress: No Stress Concern Present (08/08/2022)  Tobacco Use: Low Risk  (10/29/2022)    Readmission Risk Interventions    11/04/2022    3:46 PM 09/26/2022   10:27 AM  Readmission Risk Prevention Plan  Transportation Screening Complete Complete  PCP or Specialist Appt within 5-7 Days  Complete  PCP or Specialist Appt within 3-5 Days Complete   Home Care Screening  Complete  Medication Review (RN CM)  Complete  HRI or [place] Complete   Social Work Consult for [place] Planning/Counseling Complete   Palliative Care Screening Complete   Medication Review Press photographer) Complete

## 2022-11-06 NOTE — TOC Progression Note (Signed)
Transition of Care [facility] - Progression Note    Patient Details  Name: [name] [name] MRN: [id number] Date of Birth: 1935/06/29  [facility]) CM/SW Contact  Khameron Gruenwald, Juliann Pulse, RN Phone Number: 11/06/2022, 1:52 PM  Clinical Narrative:  Awaiting choice prior auth.     Expected Discharge Plan: [place] Barriers to Discharge: Continued Medical Work up  Expected Discharge Plan and Services   Discharge Planning Services: CM Consult Post Acute Care Choice: [place] arrangements for the past 2 months: Single Family Home                                       Social Determinants of Health (SDOH) Interventions SDOH Screenings   Food Insecurity: No Food Insecurity (10/28/2022)  Housing: Low Risk  (10/29/2022)  Transportation Needs: No Transportation Needs (10/29/2022)  Utilities: Not At Risk (10/29/2022)  Alcohol Screen: Low Risk  (07/31/2021)  Depression (PHQ2-9): Low Risk  (08/08/2022)  Financial Resource Strain: Low Risk  (08/08/2022)  Physical Activity: Insufficiently Active (08/08/2022)  Social Connections: Moderately Isolated (07/31/2021)  Stress: No Stress Concern Present (08/08/2022)  Tobacco Use: Low Risk  (10/29/2022)    Readmission Risk Interventions    11/04/2022    3:46 PM 09/26/2022   10:27 AM  Readmission Risk Prevention Plan  Transportation Screening Complete Complete  PCP or Specialist Appt within 5-7 Days  Complete  PCP or Specialist Appt within 3-5 Days Complete   Home Care Screening  Complete  Medication Review (RN CM)  Complete  HRI or [place] Complete   Social Work Consult for [place] Planning/Counseling Complete   Palliative Care Screening Complete   Medication Review Press photographer) Complete

## 2022-11-06 NOTE — Progress Notes (Signed)
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 Patient seen in the ED, noted to be pancytopenic and also noted to be in AKI and subsequently admitted for further evaluation and management.  She was treated with IV fluids and renal function and improved.  She started having fevers for which she was started on broad-spectrum antibiotics.  ID was consulted.  She was also started on antivirals.  Assessment & Plan:   Febrile neutropenia Pancytopenia -Oncology and ID following.  Blood cultures negative so far.  Respiratory viral PCR negative.  CT of chest abdomen and pelvis on 11/03/2022 showed small bilateral pleural effusions and bibasilar atelectasis with no focal consolidation, no acute intra-abdominal or pelvic pathology.  Vanco and meropenem have been discontinued by ID on 11/05/2022.  Currently on valacyclovir: ID recommended to finish 7-day course. -No temperature spike over the last 24 hours. -Currently on sargramostim as per oncology.  Monitor CBC: ANC improving to 900 today.  Mucositis/stomatitis -Possibly secondary to methotrexate toxicity in the setting of AKI. -Continue Magic mouthwash with lidocaine.   -Slowly improving  AKI superimposed on CKD stage IIIa -Resolved with hydration.  Monitor intermittently  Large granular lymphocytic leukemia -Methotrexate on hold.  Oncology following  Rheumatoid arthritis -Continue to hold leflunomide  Hypertension -ACE inhibitor will not be resumed.  Low-dose amlodipine has been started during this hospitalization which will be continued.  Monitor blood pressure  Hypothyroidism-continue  Synthroid  Prediabetes -Jardiance on hold.  Outpatient follow-up  Physical deconditioning -PT now recommending SNF placement.  TOC has been consulted.  Poor oral intake -Patient has been started on [name].  Consult nutrition.  DVT prophylaxis: SCDs Code Status: DNR Family Communication: None at bedside Disposition Plan: Status is: Inpatient Remains inpatient appropriate because: Of severity of illness.  Need for SNF placement.  Consultants: Oncology/ID  Procedures: None  Antimicrobials:  Anti-infectives (From admission, onward)    Start     Dose/Rate Route Frequency Ordered Stop   11/05/22 1000  valACYclovir (VALTREX) tablet 1,000 mg        1,000 mg Oral Daily 11/04/22 1121 11/06/22 2359   11/04/22 1000  vancomycin (VANCOREADY) IVPB 500 mg/100 mL  Status:  Discontinued        500 mg 100 mL/hr over 60 Minutes Intravenous Every 24 hours 11/03/22 0844 11/05/22 0922   11/03/22 1030  valACYclovir (VALTREX) tablet 1,000 mg  Status:  Discontinued        1,000 mg Oral 2 times daily 11/03/22 0935 11/04/22 1121   11/03/22 0930  vancomycin (VANCOCIN) IVPB 1000 mg/200 mL premix        1,000 mg 200 mL/hr over 60 Minutes Intravenous  Once 11/03/22 0844 11/03/22 1120   10/31/22 1000  ceFEPIme (MAXIPIME) 2 g in sodium chloride 0.9 % 100 mL IVPB  Status:  Discontinued        2 g 200 mL/hr over 30 Minutes Intravenous Every 24 hours 10/31/22 0821 10/31/22 0843   10/31/22 1000  meropenem (MERREM) 1 g in sodium chloride 0.9 % 100 mL IVPB  Status:  Discontinued        1 g 200 mL/hr over 30 Minutes Intravenous Every 12 hours 10/31/22 0844 11/05/22 0922   10/30/22 2200  acyclovir (ZOVIRAX)  480 mg in dextrose 5 % 100 mL IVPB  Status:  Discontinued        10 mg/kg  47.9 kg 109.6 mL/hr over 60 Minutes Intravenous Every 12 hours 10/30/22 1248 11/03/22 0935   10/29/22 0800  acyclovir (ZOVIRAX) 480 mg in dextrose 5 % 100 mL IVPB  Status:  Discontinued        10 mg/kg  47.9 kg 109.6 mL/hr over 60  Minutes Intravenous Every 24 hours 10/29/22 0739 10/30/22 1248        Subjective: Patient seen and examined at bedside.  Appetite and oral intake slightly improving but still not that good.  Mouth pain continues to improve.  No fever, vomiting, shortness of breath reported. Objective: Vitals:   11/05/22 1242 11/05/22 2122 11/05/22 2300 11/06/22 0454  BP: 119/62 (!) 143/68  (!) 126/59  Pulse: 99 (!) 113 (!) 103 96  Resp: 18 16  15   Temp: 97.8 F (36.6 C) 100.3 F (37.9 C) 99 F (37.2 C) (!) 97.5 F (36.4 C)  TempSrc: Oral Oral Oral Oral  SpO2: 98% 97%  97%  Weight:      Height:        Intake/Output Summary (Last 24 hours) at 11/06/2022 0748 Last data filed at 11/06/2022 0545 Gross per 24 hour  Intake 300 ml  Output 750 ml  Net -450 ml    Filed Weights   10/28/22 2147  Weight: 47.9 kg    Examination:  General: Currently on room air.  No distress.  Looks chronically ill and deconditioned. ENT/neck: No thyromegaly.  JVD is not elevated  respiratory: Decreased breath sounds at bases bilaterally with some crackles; no wheezing  CVS: S1-S2 heard, rate controlled currently Abdominal: Soft, nontender, slightly distended; no organomegaly, bowel sounds are heard Extremities: Trace lower extremity edema; no cyanosis  CNS: Awake and alert.  Slow to respond, poor historian.  No focal neurologic deficit.  Moves extremities Lymph: No obvious lymphadenopathy Skin: No obvious ecchymosis/lesions  psych: Currently not agitated.  Mostly flat affect. musculoskeletal: No obvious joint swelling/deformity     Data Reviewed: I have personally reviewed following labs and imaging studies  CBC: Recent Labs  Lab 11/02/22 0438 11/03/22 0442 11/04/22 0511 11/05/22 0507 11/06/22 0500  WBC 3.1* 2.6* 3.5* 4.0 4.7  NEUTROABS 0.1* 0.1* 0.2* 0.3* 0.9*  HGB 9.8* 10.0* 10.6* 9.8* 9.9*  HCT 31.2* 31.1* 33.1* 30.4* 30.7*  MCV 86.4 84.3 82.8 83.5 83.0  PLT 129* 147* 138* 100* 83*    Basic  Metabolic Panel: Recent Labs  Lab 10/31/22 0438 11/01/22 0532 11/02/22 0438 11/03/22 0442 11/04/22 0511 11/05/22 0507 11/06/22 0500  NA 134*   < > 137 135 134* 136 135  K 4.0   < > 4.3 3.9 4.4 4.2 3.9  CL 105   < > 108 107 106 105 107  CO2 22   < > 23 22 22 24 23   GLUCOSE 97   < > 92 83 97 116* 112*  BUN 17   < > 14 12 12 20  25*  CREATININE 1.07*   < > 0.77 0.92 1.09* 1.19* 0.91  CALCIUM 8.1*   < > 8.1* 8.1* 8.2* 8.3* 8.1*  MG 2.2  --  1.8 2.0 1.8  --  2.0   < > = values in this interval not displayed.    GFR: Estimated Creatinine Clearance: 34.1 mL/min (by C-G formula based on SCr of 0.91 mg/dL). Liver Function Tests: Recent Labs  Lab 10/31/22 [phone] 11/01/22 0532  11/02/22 0438 11/03/22 0442 11/04/22 0511  AST 34 39 29 31 44*  ALT 20 23 19 17 21   ALKPHOS 50 66 56 63 80  BILITOT 0.9 0.7 0.7 0.8 1.0  PROT 5.9* 6.6 5.4* 5.5* 5.5*  ALBUMIN 2.8* 3.0* 2.6* 2.6* 2.6*    No results for input(s): "LIPASE", "AMYLASE" in the last 168 hours. No results for input(s): "AMMONIA" in the last 168 hours. Coagulation Profile: No results for input(s): "INR", "PROTIME" in the last 168 hours. Cardiac Enzymes: No results for input(s): "CKTOTAL", "CKMB", "CKMBINDEX", "TROPONINI" in the last 168 hours. BNP (last 3 results) No results for input(s): "PROBNP" in the last 8760 hours. HbA1C: No results for input(s): "HGBA1C" in the last 72 hours. CBG: Recent Labs  Lab 11/04/22 0716  GLUCAP 87    Lipid Profile: No results for input(s): "CHOL", "HDL", "LDLCALC", "TRIG", "CHOLHDL", "LDLDIRECT" in the last 72 hours. Thyroid Function Tests: No results for input(s): "TSH", "T4TOTAL", "FREET4", "T3FREE", "THYROIDAB" in the last 72 hours. Anemia Panel: No results for input(s): "VITAMINB12", "FOLATE", "FERRITIN", "TIBC", "IRON", "RETICCTPCT" in the last 72 hours. Sepsis Labs: No results for input(s): "PROCALCITON", "LATICACIDVEN" in the last 168 hours.  Recent Results (from the past 240  hour(s))  Culture, blood (Routine X 2) w Reflex to ID Panel     Status: None   Collection Time: 10/31/22  8:41 AM   Specimen: BLOOD  Result Value Ref Range Status   Specimen Description   Final    BLOOD BLOOD RIGHT ARM Performed at [facility] [address] [place], [place] [address]    Special Requests   Final    BOTTLES DRAWN AEROBIC ONLY Blood Culture adequate volume Performed at [facility] [address], [place], [place] [address]    Culture   Final    NO GROWTH 5 DAYS Performed at [facility], [place] [address], [place], [place] [address]    Report Status 11/05/2022 FINAL  Final  Culture, blood (Routine X 2) w Reflex to ID Panel     Status: None   Collection Time: 10/31/22  8:43 AM   Specimen: BLOOD  Result Value Ref Range Status   Specimen Description   Final    BLOOD BLOOD RIGHT HAND Performed at [facility] [address] [place], [place] [address]    Special Requests   Final    BOTTLES DRAWN AEROBIC ONLY Blood Culture results may not be optimal due to an inadequate volume of blood received in culture bottles Performed at [facility] [address] [place], [place] [address]    Culture   Final    NO GROWTH 5 DAYS Performed at [facility], [place] [address], [place], [place] [address]    Report Status 11/05/2022 FINAL  Final  Urine Culture (for pregnant, neutropenic or urologic patients or patients with an indwelling urinary catheter)     Status: None   Collection Time: 10/31/22 10:29 AM   Specimen: Urine, Catheterized  Result Value Ref Range Status   Specimen Description   Final    URINE, CATHETERIZED Performed at [facility], [place] [address], [place], [place] [address]    Special Requests   Final    Immunocompromised Performed at [facility], [place] [address], [place], [place] [address]    Culture   Final    NO  GROWTH Performed at [facility], [place] [address], [place], [place]  [id number]    Report Status 11/01/2022 FINAL  Final  Culture, blood (Routine X 2) w Reflex to ID Panel     Status: None (Preliminary result)   Collection Time: 11/03/22  7:23 AM   Specimen: BLOOD  Result Value Ref Range Status   Specimen Description   Final    BLOOD BLOOD RIGHT ARM AEROBIC BOTTLE ONLY Performed at [facility] [address] [place], [place] [address]    Special Requests   Final    BOTTLES DRAWN AEROBIC ONLY Blood Culture results may not be optimal due to an inadequate volume of blood received in culture bottles Performed at [facility] [address] [place], [place] [address]    Culture   Final    NO GROWTH 2 DAYS Performed at [facility] [address] [place], [place] [address]    Report Status PENDING  Incomplete  Culture, blood (Routine X 2) w Reflex to ID Panel     Status: None (Preliminary result)   Collection Time: 11/03/22  7:23 AM   Specimen: BLOOD  Result Value Ref Range Status   Specimen Description   Final    BLOOD BLOOD RIGHT HAND AEROBIC BOTTLE ONLY Performed at [facility] [address] [place], [place] [address]    Special Requests   Final    BOTTLES DRAWN AEROBIC ONLY Blood Culture adequate volume Performed at [facility] [address] [place], [place] [address]    Culture   Final    NO GROWTH 2 DAYS Performed at [facility] [address] [place], [place] [address]    Report Status PENDING  Incomplete  Respiratory (~20 pathogens) panel by PCR     Status: None   Collection Time: 11/03/22  4:07 PM   Specimen: Nasopharyngeal Swab; Respiratory  Result Value Ref Range Status   Adenovirus NOT DETECTED NOT DETECTED Final   Coronavirus 229E NOT DETECTED NOT DETECTED Final    Comment: (NOTE) The Coronavirus on the Respiratory Panel, DOES NOT test for the novel   Coronavirus (2019 nCoV)    Coronavirus HKU1 NOT DETECTED NOT DETECTED Final   Coronavirus NL63 NOT DETECTED NOT DETECTED Final   Coronavirus OC43 NOT DETECTED NOT DETECTED Final   Metapneumovirus NOT DETECTED NOT DETECTED Final   Rhinovirus / Enterovirus NOT DETECTED NOT DETECTED Final   Influenza A NOT DETECTED NOT DETECTED Final   Influenza B NOT DETECTED NOT DETECTED Final   Parainfluenza Virus 1 NOT DETECTED NOT DETECTED Final   Parainfluenza Virus 2 NOT DETECTED NOT DETECTED Final   Parainfluenza Virus 3 NOT DETECTED NOT DETECTED Final   Parainfluenza Virus 4 NOT DETECTED NOT DETECTED Final   Respiratory Syncytial Virus NOT DETECTED NOT DETECTED Final   Bordetella pertussis NOT DETECTED NOT DETECTED Final   Bordetella Parapertussis NOT DETECTED NOT DETECTED Final   Chlamydophila pneumoniae NOT DETECTED NOT DETECTED Final   Mycoplasma pneumoniae NOT DETECTED NOT DETECTED Final    Comment: Performed at [facility], [place]. [address] [place], [place] [address]         Radiology Studies: No results found.      Scheduled Meds:  acetaminophen  650 mg Oral Once   amLODipine  2.5 mg Oral Daily   antiseptic oral rinse  15 mL Mouth Rinse Q6H   feeding supplement  237 mL Oral BID BM   folic acid  1 mg Oral Daily   influenza vaccine  adjuvanted  0.5 mL Intramuscular Tomorrow-1000   levothyroxine  75 mcg Oral Q0600   magic mouthwash w/lidocaine  10 mL Oral QID   megestrol  400 mg Oral BID   polyethylene glycol  17 g Oral Daily   sargramostim  500 mcg Subcutaneous Daily   senna-docusate  1 tablet Oral BID   valACYclovir  1,000 mg Oral Daily   Continuous Infusions:        Aline August, MD [facility] 11/06/2022, 7:48 AM

## 2022-11-06 NOTE — Telephone Encounter (Signed)
Pts son called she is at [facility] since the 26th. He doesn't know if she will be getting out in time.

## 2022-11-07 DIAGNOSIS — M05721 Rheumatoid arthritis with rheumatoid factor of right elbow without organ or systems involvement: Secondary | ICD-10-CM

## 2022-11-07 DIAGNOSIS — M0579 Rheumatoid arthritis with rheumatoid factor of multiple sites without organ or systems involvement: Secondary | ICD-10-CM | POA: Diagnosis not present

## 2022-11-07 DIAGNOSIS — D61818 Other pancytopenia: Secondary | ICD-10-CM | POA: Diagnosis not present

## 2022-11-07 DIAGNOSIS — C91Z0 Other lymphoid leukemia not having achieved remission: Secondary | ICD-10-CM | POA: Diagnosis not present

## 2022-11-07 DIAGNOSIS — N1831 Chronic kidney disease, stage 3a: Secondary | ICD-10-CM | POA: Diagnosis not present

## 2022-11-07 DIAGNOSIS — K121 Other forms of stomatitis: Secondary | ICD-10-CM | POA: Diagnosis not present

## 2022-11-07 DIAGNOSIS — D709 Neutropenia, unspecified: Secondary | ICD-10-CM | POA: Diagnosis not present

## 2022-11-07 DIAGNOSIS — R5081 Fever presenting with conditions classified elsewhere: Secondary | ICD-10-CM | POA: Diagnosis not present

## 2022-11-07 DIAGNOSIS — N179 Acute kidney failure, unspecified: Secondary | ICD-10-CM | POA: Diagnosis not present

## 2022-11-07 LAB — CBC WITH DIFFERENTIAL/PLATELET
Abs Immature Granulocytes: 0.06 10*3/uL (ref 0.00–0.07)
Basophils Absolute: 0.1 10*3/uL (ref 0.0–0.1)
Basophils Relative: 2 %
Eosinophils Absolute: 0.1 10*3/uL (ref 0.0–0.5)
Eosinophils Relative: 2 %
HCT: 29.6 % — ABNORMAL LOW (ref 36.0–46.0)
Hemoglobin: 9.7 g/dL — ABNORMAL LOW (ref 12.0–15.0)
Immature Granulocytes: 1 %
Lymphocytes Relative: 45 %
Lymphs Abs: 3 10*3/uL (ref 0.7–4.0)
MCH: 27 pg (ref 26.0–34.0)
MCHC: 32.8 g/dL (ref 30.0–36.0)
MCV: 82.5 fL (ref 80.0–100.0)
Monocytes Absolute: 1.4 10*3/uL — ABNORMAL HIGH (ref 0.1–1.0)
Monocytes Relative: 22 %
Neutro Abs: 1.8 10*3/uL (ref 1.7–7.7)
Neutrophils Relative %: 28 %
Platelets: 79 10*3/uL — ABNORMAL LOW (ref 150–400)
RBC: 3.59 MIL/uL — ABNORMAL LOW (ref 3.87–5.11)
RDW: 22.5 % — ABNORMAL HIGH (ref 11.5–15.5)
WBC: 6.5 10*3/uL (ref 4.0–10.5)
nRBC: 0.6 % — ABNORMAL HIGH (ref 0.0–0.2)

## 2022-11-07 LAB — FUNGITELL BETA-D-GLUCAN
Fungitell Value:: <31.25 pg/mL
Result Name:: NEGATIVE

## 2022-11-07 LAB — BASIC METABOLIC PANEL
Anion gap: 4 — ABNORMAL LOW (ref 5–15)
BUN: 22 mg/dL (ref 8–23)
CO2: 24 mmol/L (ref 22–32)
Calcium: 8.1 mg/dL — ABNORMAL LOW (ref 8.9–10.3)
Chloride: 108 mmol/L (ref 98–111)
Creatinine, Ser: 0.92 mg/dL (ref 0.44–1.00)
GFR, Estimated: >60 mL/min (ref >60)
Glucose, Bld: 112 mg/dL — ABNORMAL HIGH (ref 70–99)
Potassium: 3.9 mmol/L (ref 3.5–5.1)
Sodium: 136 mmol/L (ref 135–145)

## 2022-11-07 LAB — MAGNESIUM: Magnesium: 1.9 mg/dL (ref 1.7–2.4)

## 2022-11-07 MED ORDER — POLYETHYLENE GLYCOL 3350 17 G PO PACK: 17.0000 g | PACK | Freq: Every day | ORAL | 0 refills | Status: DC | PRN

## 2022-11-07 MED ORDER — AMLODIPINE BESYLATE 2.5 MG PO TABS: 2.5000 mg | ORAL_TABLET | Freq: Every day | ORAL | 0 refills | Status: DC

## 2022-11-07 MED ORDER — SENNOSIDES-DOCUSATE SODIUM 8.6-50 MG PO TABS: 1.0000 | ORAL_TABLET | Freq: Two times a day (BID) | ORAL | 0 refills | Status: DC

## 2022-11-07 MED ORDER — LIDOCAINE VISCOUS HCL 2 % MT SOLN: 10.0000 mL | Freq: Four times a day (QID) | OROMUCOSAL | 1 refills | Status: DC

## 2022-11-07 MED ORDER — MEGESTROL ACETATE 400 MG/10ML PO SUSP
400.0000 mg | Freq: Two times a day (BID) | ORAL | 0 refills | Status: DC
Start: 2022-11-07 — End: 2022-11-17

## 2022-11-07 MED ORDER — FOLIC ACID 1 MG PO TABS: 1.0000 mg | ORAL_TABLET | Freq: Every day | ORAL | 0 refills | Status: DC

## 2022-11-07 NOTE — Progress Notes (Signed)
Ms. Clifft is doing very nicely.  She really has had no issues.  Her white cell counts come up quite nicely.  The ulceration on the right buccal mucosa is now healed..  She is off antibiotics.  I think she may have had little bit temperature last night.  Her white cell count is now 6.5.  Her absolute neutrophil count is 1800.  Hemoglobin 9.7.  Platelet count 79,000.  Again I think the platelet count is little bit lower because of the Leukine as she is received.  She is eating a little bit better.  She is on [name] elixir.  She definitely needs this as an outpatient.  I am just happy that her white cell count came up so nicely.  I do worry that it will start dropping once we stop the Leukine.  She is going to have to be followed closely as an outpatient.  She has had no diarrhea.  There is no cough.  She has had no nausea or vomiting.  There has been no rashes.  Her vital signs show temperature 97.8.  Pulse 91.  Blood pressure 121/62.  Her head neck exam shows healing of the oral ulcer.  Lungs are clear bilaterally.  Cardiac exam regular rate and rhythm with no murmurs.  Abdomen is soft.  Bowel sounds are present.  There is no guarding or rebound tenderness.  Extremity shows no clubbing, cyanosis or edema.  Skin exam shows no rashes.  Neurological exam is nonfocal.  All cultures have been negative.  Again she had a little bit of a temperature spike yesterday.  She her white cell counts come up nicely.  She is no longer neutropenic.  I think that she probably could be discharged.  She must be on [name] elixir for when she is discharged.  She really needs someone to stimulate her appetite.  We will likely have to get her back to the office about a week or so after she is discharged to follow-up with her blood count.  I do appreciate the great help that we have gotten from everybody up on 4 E.  Christin Bach, MD  1 Cor 15:58

## 2022-11-07 NOTE — Care Management Important Message (Signed)
Important Message  Patient Details IM Letter given. Name: [name] [name] MRN: [id number] Date of Birth: 29-Mar-1935   Medicare Important Message Given:  Yes     Caren Macadam 11/07/2022, 12:49 PM

## 2022-11-07 NOTE — Telephone Encounter (Signed)
Returned patients son call no answer LMTCB tried alternative number but it was not in service.

## 2022-11-07 NOTE — Telephone Encounter (Signed)
Spoke with patients son per Tawanna Cooler he was calling to let Dr. Doreene Burke know that patient was not going to be able to make it to her appointment that was scheduled on 11/10/22 due to patient still being in the hospital and they are not sure when she will be discharged. Tod agrees to give [place] a call to schedule follow up appointment when patient is home and up to coming in for follow up.

## 2022-11-07 NOTE — Progress Notes (Signed)
[facility]  Date of Admission:  10/28/2022           Reason for visit: Follow up on fevers  Current antibiotics: Valtrex completed   ASSESSMENT:    88 y.o. female admitted with:  Neutropenic fever Large granular lymphocytic leukemia on methotrexate maintenance Rheumatoid arthritis Oral ulcers status post Valtrex course CKD 3   RECOMMENDATIONS:    She has completed course of Valtrex x 7 days Fevers continue but may be slight improvement in fever curve with improvement in neutropenia.  ANC today 1.8 No clear infectious etiology at this time Continue to monitor off antibiotics Please call as needed   Principal Problem:   Acute renal failure superimposed on stage 3a chronic kidney disease Active Problems:   Hypothyroidism   Essential hypertension   Rheumatoid arthritis   Neutropenia   Iron deficiency anemia   Large granular lymphocytic leukemia   AKI (acute kidney injury)   Pancytopenia   Stomatitis   Pre-diabetes   Dehydration   Neutropenic fever   Aphthous ulcer    MEDICATIONS:    Scheduled Meds:  acetaminophen  650 mg Oral Once   amLODipine  2.5 mg Oral Daily   antiseptic oral rinse  15 mL Mouth Rinse Q6H   feeding supplement  237 mL Oral BID BM   folic acid  1 mg Oral Daily   influenza vaccine adjuvanted  0.5 mL Intramuscular Tomorrow-1000   levothyroxine  75 mcg Oral Q0600   magic mouthwash w/lidocaine  10 mL Oral QID   megestrol  400 mg Oral BID   polyethylene glycol  17 g Oral Daily   sargramostim  500 mcg Subcutaneous Daily   senna-docusate  1 tablet Oral BID   Continuous Infusions: PRN Meds:.acetaminophen, ondansetron (ZOFRAN) IV  SUBJECTIVE:   24 hour events:  No acute events noted Fevers continue, however, possibly slight improvement in curve No new symptoms reported ANC up to 1.8 She remains off antimicrobials  She is feeling a "whole lot better".  Her mouth is improved and she has been able to  tolerate PO.   Review of Systems  All other systems reviewed and are negative.     OBJECTIVE:   Blood pressure 121/62, pulse 91, temperature 97.8 F (36.6 C), temperature source Oral, resp. rate 20, height 5\' 1"  (1.549 m), weight 47.9 kg, SpO2 100 %. Body mass index is 19.97 kg/m.  Physical Exam Constitutional:      Appearance: Normal appearance.  HENT:     Head: Normocephalic and atraumatic.  Neurological:     General: No focal deficit present.     Mental Status: She is alert and oriented to person, place, and time.  Psychiatric:        Mood and Affect: Mood normal.        Behavior: Behavior normal.      Lab Results: Lab Results  Component Value Date   WBC 6.5 11/07/2022   HGB 9.7 (L) 11/07/2022   HCT 29.6 (L) 11/07/2022   MCV 82.5 11/07/2022   PLT 79 (L) 11/07/2022    Lab Results  Component Value Date   NA 136 11/07/2022   K 3.9 11/07/2022   CO2 24 11/07/2022   GLUCOSE 112 (H) 11/07/2022   BUN 22 11/07/2022   CREATININE 0.92 11/07/2022   CALCIUM 8.1 (L) 11/07/2022   GFRNONAA >60 11/07/2022   GFRAA 50 (L) 08/09/2020    Lab Results  Component Value Date   ALT  21 11/04/2022   AST 44 (H) 11/04/2022   ALKPHOS 80 11/04/2022   BILITOT 1.0 11/04/2022    No results found for: "CRP"     Component Value Date/Time   ESRSEDRATE 57 (H) 07/15/2021 0926     I have reviewed the micro and lab results in Epic.  Imaging: No results found.   Imaging independently reviewed in Epic.    Vedia Coffer for Infectious Disease [facility] [phone] pager 11/07/2022, 7:57 AM

## 2022-11-07 NOTE — Progress Notes (Signed)
Physical Therapy Treatment Patient Details Name: [name] [name] MRN: [id number] DOB: August 20, 1934 Today's Date: 11/07/2022   History of Present Illness Patient is a 88 year old female who presented with oral ulcers and decreased oral intake.patient was admitted with acute renal failure, neutropenic fever,and mucositis on 10/28/22. UDJ:SHFWY granular lymphocytic leukemia, RA, HTN, hypothyroidism.    PT Comments    Pt with significant improvements today.  Able to ambulate 250' with cane and supervision. Remainder of session focused on standing balance activities, sit to stands for strengthening, and posture exercises to promote increased independence at home with adls, transfers, and iadls.  Pt tolerated well.  Pt will continue to benefit from acute PT services to advance to baseline.  Updated d/c recommendations based on good progress today.    Recommendations for follow up therapy are one component of a multi-disciplinary discharge planning process, led by the attending physician.  Recommendations may be updated based on patient status, additional functional criteria and insurance authorization.  Follow Up Recommendations  Can patient physically be transported by private vehicle: Yes    Assistance Recommended at Discharge Intermittent Supervision/Assistance  Patient can return home with the following A little help with walking and/or transfers;A little help with bathing/dressing/bathroom;Assistance with cooking/housework;Help with stairs or ramp for entrance   Equipment Recommendations  Rolling walker (2 wheels)    Recommendations for Other Services       Precautions / Restrictions Precautions Precautions: Fall     Mobility  Bed Mobility Overal bed mobility: Modified Independent Bed Mobility: Supine to Sit     Supine to sit: Modified independent (Device/Increase time), HOB elevated          Transfers Overall transfer level: Needs assistance Equipment used: Straight cane,  None Transfers: Sit to/from Stand Sit to Stand: Supervision           General transfer comment: Supervision for safety; performed with and without cane with good hand placement; performed 5 x STS for exercise and 5 additional stands during session    Ambulation/Gait Ambulation/Gait assistance: Min guard, Supervision Gait Distance (Feet): 250 Feet Assistive device: Straight cane Gait Pattern/deviations: Step-to pattern, Decreased stride length, Trunk flexed Gait velocity: decreased     General Gait Details: min cues to stand straight; ambulated slowly but no LOB   Stairs             Wheelchair Mobility    Modified Rankin (Stroke Patients Only)       Balance Overall balance assessment: Needs assistance Sitting-balance support: Feet supported, No upper extremity supported Sitting balance-Leahy Scale: Good     Standing balance support: No upper extremity supported Standing balance-Leahy Scale: Good Standing balance comment: Cane to ambulate but participated in balance task (see below)               High Level Balance Comments: Standing marching (low clearance) x 10 min g without UE support; Standing reaching all directions for targets outside BOS bil UE x8; picking 4" tall object up from floor x 5            Cognition Arousal/Alertness: Awake/alert Behavior During Therapy: WFL for tasks assessed/performed Overall Cognitive Status: No family/caregiver present to determine baseline cognitive functioning                                 General Comments: some short term memory deficits        Exercises Other Exercises Other Exercises:  STS x 5 Other Exercises: In standing reaching arms overhead with deep breath and standing straight x 10 - reports feeling good stretch in abd and hip flexor    General Comments General comments (skin integrity, edema, etc.): HR 90's rest and 106 bpm walking      Pertinent Vitals/Pain Pain  Assessment Pain Assessment: No/denies pain    Home Living                          Prior Function            PT Goals (current goals can now be found in the care plan section) Progress towards PT goals: Progressing toward goals    Frequency    Min 3X/week      PT Plan Discharge plan needs to be updated    Co-evaluation              AM-PAC PT "6 Clicks" Mobility   Outcome Measure  Help needed turning from your back to your side while in a flat bed without using bedrails?: None Help needed moving from lying on your back to sitting on the side of a flat bed without using bedrails?: None Help needed moving to and from a bed to a chair (including a wheelchair)?: A Little Help needed standing up from a chair using your arms (e.g., wheelchair or bedside chair)?: A Little Help needed to walk in hospital room?: A Little Help needed climbing 3-5 steps with a railing? : A Little 6 Click Score: 20    End of Session Equipment Utilized During Treatment: Gait belt Activity Tolerance: Patient tolerated treatment well Patient left: with call bell/phone within reach;with chair alarm set;in chair Nurse Communication: Mobility status PT Visit Diagnosis: Muscle weakness (generalized) (M62.81)     Time: 6295-2841 PT Time Calculation (min) (ACUTE ONLY): 23 min  Charges:  $Gait Training: 8-22 mins $Neuromuscular Re-education: 8-22 mins                     Anise Salvo, PT Acute Rehab [facility] [phone]    Rayetta Humphrey 11/07/2022, 12:45 PM

## 2022-11-07 NOTE — Plan of Care (Signed)

## 2022-11-07 NOTE — TOC Transition Note (Signed)
Transition of Care [facility]) - CM/SW Discharge Note   Patient Details  Name: [name] [name] MRN: [id number] Date of Birth: 14-Feb-1935  Transition of Care [facility]) CM/SW Contact:  Adrian Prows, RN Phone Number: 11/07/2022, 4:20 PM   Clinical Narrative:    Pt declined RW; HHPT arranged w/ Iantha Fallen; spoke w/ amy; pt's son [name] notified; contact information for agency placed in follow up provider section of d/c instructions; no TOC needs.   Final next level of care: Home w Home Health Services Barriers to Discharge: No Barriers Identified   Patient Goals and CMS Choice CMS Medicare.gov Compare Post Acute Care list provided to:: Patient Represenative (must comment) (Pam(dtr in law)) Choice offered to / list presented to : Adult Children  Discharge Placement                         Discharge Plan and Services Additional resources added to the After Visit Summary for     Discharge Planning Services: CM Consult Post Acute Care Choice: Home Health                    HH Arranged: PT [facility] Agency: [facility] Date [facility] Agency Contacted: 11/07/22 Time HH Agency Contacted: 1619 Representative spoke with at [facility] Agency: Amy Hyatt  Social Determinants of Health (SDOH) Interventions SDOH Screenings   Food Insecurity: No Food Insecurity (10/28/2022)  Housing: Low Risk  (10/29/2022)  Transportation Needs: No Transportation Needs (10/29/2022)  Utilities: Not At Risk (10/29/2022)  Alcohol Screen: Low Risk  (07/31/2021)  Depression (PHQ2-9): Low Risk  (08/08/2022)  Financial Resource Strain: Low Risk  (08/08/2022)  Physical Activity: Insufficiently Active (08/08/2022)  Social Connections: Moderately Isolated (07/31/2021)  Stress: No Stress Concern Present (08/08/2022)  Tobacco Use: Low Risk  (10/29/2022)     Readmission Risk Interventions    11/04/2022    3:46 PM 09/26/2022   10:27 AM  Readmission Risk Prevention Plan  Transportation Screening Complete Complete  PCP or  Specialist Appt within 5-7 Days  Complete  PCP or Specialist Appt within 3-5 Days Complete   Home Care Screening  Complete  Medication Review (RN CM)  Complete  HRI or Home Care Consult Complete   Social Work Consult for Recovery Care Planning/Counseling Complete   Palliative Care Screening Complete   Medication Review Oceanographer) Complete

## 2022-11-07 NOTE — Progress Notes (Signed)
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 Patient seen in the ED, noted to be pancytopenic and also noted to be in AKI and subsequently admitted for further evaluation and management.  She was treated with IV fluids and renal function and improved.  She started having fevers for which she was started on broad-spectrum antibiotics.  ID was consulted.  She was also started on antivirals.  Assessment & Plan:   Febrile neutropenia Pancytopenia -Oncology and ID following.  Blood cultures negative so far.  Respiratory viral PCR negative.  CT of chest abdomen and pelvis on 11/03/2022 showed small bilateral pleural effusions and bibasilar atelectasis with no focal consolidation, no acute intra-abdominal or pelvic pathology.  Vanco and meropenem have been discontinued by ID on 11/05/2022.  ID recommended to finish 7-day course of antivirals. -Temperature max of 101.4 over the last 4 hours. -Off sargramostim.  Monitor CBC: ANC improving to 1800 today.  Mucositis/stomatitis -Possibly secondary to methotrexate toxicity in the setting of AKI. -Continue Magic mouthwash with lidocaine.   -Slowly improving  AKI superimposed on CKD stage IIIa -Resolved with hydration.  Monitor intermittently  Large granular lymphocytic leukemia -Methotrexate on hold.  Oncology following  Rheumatoid arthritis -Continue to hold leflunomide  Hypertension -ACE inhibitor will not be resumed.  Low-dose amlodipine has been started during this hospitalization which will be continued.  Monitor blood pressure  Hypothyroidism-continue Synthroid  Prediabetes -Jardiance on hold.   Outpatient follow-up  Physical deconditioning -PT now recommending SNF placement.  TOC has been consulted.  Poor oral intake -Patient has been started on [name].  Consult nutrition.  DVT prophylaxis: SCDs Code Status: DNR Family Communication: None at bedside Disposition Plan: Status is: Inpatient Remains inpatient appropriate because: Of severity of illness.  Need for SNF placement.  Consultants: Oncology/ID  Procedures: None  Antimicrobials:  Anti-infectives (From admission, onward)    Start     Dose/Rate Route Frequency Ordered Stop   11/05/22 1000  valACYclovir (VALTREX) tablet 1,000 mg        1,000 mg Oral Daily 11/04/22 1121 11/06/22 0907   11/04/22 1000  vancomycin (VANCOREADY) IVPB 500 mg/100 mL  Status:  Discontinued        500 mg 100 mL/hr over 60 Minutes Intravenous Every 24 hours 11/03/22 0844 11/05/22 0922   11/03/22 1030  valACYclovir (VALTREX) tablet 1,000 mg  Status:  Discontinued        1,000 mg Oral 2 times daily 11/03/22 0935 11/04/22 1121   11/03/22 0930  vancomycin (VANCOCIN) IVPB 1000 mg/200 mL premix        1,000 mg 200 mL/hr over 60 Minutes Intravenous  Once 11/03/22 0844 11/03/22 1120   10/31/22 1000  ceFEPIme (MAXIPIME) 2 g in sodium chloride 0.9 % 100 mL IVPB  Status:  Discontinued        2 g 200 mL/hr over 30 Minutes Intravenous Every 24 hours 10/31/22 0821 10/31/22 0843   10/31/22 1000  meropenem (MERREM) 1 g in sodium chloride 0.9 % 100 mL IVPB  Status:  Discontinued        1 g 200 mL/hr over 30 Minutes Intravenous Every 12 hours 10/31/22 0844 11/05/22 0922   10/30/22 2200  acyclovir (ZOVIRAX) 480 mg in dextrose  5 % 100 mL IVPB  Status:  Discontinued        10 mg/kg  47.9 kg 109.6 mL/hr over 60 Minutes Intravenous Every 12 hours 10/30/22 1248 11/03/22 0935   10/29/22 0800  acyclovir (ZOVIRAX) 480 mg in dextrose 5 % 100 mL IVPB  Status:  Discontinued        10 mg/kg  47.9 kg 109.6 mL/hr over 60 Minutes Intravenous Every 24 hours 10/29/22 0739  10/30/22 1248        Subjective: Patient seen and examined at bedside.  No shortness of breath, vomiting, chest pain reported.  Oral intake slightly improving.  Had fever yesterday evening.   Objective: Vitals:   11/06/22 2021 11/06/22 2132 11/07/22 0200 11/07/22 0427  BP: 127/69   121/62  Pulse: (!) 111   91  Resp: 20  19 20   Temp: (!) 101.4 F (38.6 C) (!) 100.9 F (38.3 C)  97.8 F (36.6 C)  TempSrc: Oral   Oral  SpO2: 96%   100%  Weight:      Height:        Intake/Output Summary (Last 24 hours) at 11/07/2022 1026 Last data filed at 11/07/2022 0500 Gross per 24 hour  Intake 240 ml  Output 700 ml  Net -460 ml    Filed Weights   10/28/22 2147  Weight: 47.9 kg    Examination:  General: No acute distress.  On room air currently.  Looks chronically ill and deconditioned. ENT/neck: No obvious JVD elevation or palpable neck masses  respiratory: Bilaterally reemergence of bases with scattered crackles  CVS: Rate mostly controlled; S1 and S2 are heard Abdominal: Soft, nontender, mildly distended; no organomegaly, bowel sounds are heard normally Extremities: No clubbing or cyanosis CNS: Alert and oriented.  Still slow to respond, poor historian.  No focal neurologic deficit.  Able to move extremities Lymph: No cervical lymphadenopathy Skin: No obvious rashes/petechiae psych: No signs of agitation.  Mostly flat affect  musculoskeletal: No obvious joint swelling/deformity     Data Reviewed: I have personally reviewed following labs and imaging studies  CBC: Recent Labs  Lab 11/03/22 0442 11/04/22 0511 11/05/22 0507 11/06/22 0500 11/07/22 0450  WBC 2.6* 3.5* 4.0 4.7 6.5  NEUTROABS 0.1* 0.2* 0.3* 0.9* 1.8  HGB 10.0* 10.6* 9.8* 9.9* 9.7*  HCT 31.1* 33.1* 30.4* 30.7* 29.6*  MCV 84.3 82.8 83.5 83.0 82.5  PLT 147* 138* 100* 83* 79*    Basic Metabolic Panel: Recent Labs  Lab 11/02/22 0438 11/03/22 0442 11/04/22 0511 11/05/22 0507 11/06/22 0500 11/07/22 0450   NA 137 135 134* 136 135 136  K 4.3 3.9 4.4 4.2 3.9 3.9  CL 108 107 106 105 107 108  CO2 23 22 22 24 23 24   GLUCOSE 92 83 97 116* 112* 112*  BUN 14 12 12 20  25* 22  CREATININE 0.77 0.92 1.09* 1.19* 0.91 0.92  CALCIUM 8.1* 8.1* 8.2* 8.3* 8.1* 8.1*  MG 1.8 2.0 1.8  --  2.0 1.9    GFR: Estimated Creatinine Clearance: 33.7 mL/min (by C-G formula based on SCr of 0.92 mg/dL). Liver Function Tests: Recent Labs  Lab 11/01/22 0532 11/02/22 0438 11/03/22 0442 11/04/22 0511  AST 39 29 31 44*  ALT 23 19 17 21   ALKPHOS 66 56 63 80  BILITOT 0.7 0.7 0.8 1.0  PROT 6.6 5.4* 5.5* 5.5*  ALBUMIN 3.0* 2.6* 2.6* 2.6*    No results for input(s): "LIPASE", "AMYLASE" in the last 168 hours. No results for input(s): "AMMONIA"  in the last 168 hours. Coagulation Profile: No results for input(s): "INR", "PROTIME" in the last 168 hours. Cardiac Enzymes: No results for input(s): "CKTOTAL", "CKMB", "CKMBINDEX", "TROPONINI" in the last 168 hours. BNP (last 3 results) No results for input(s): "PROBNP" in the last 8760 hours. HbA1C: No results for input(s): "HGBA1C" in the last 72 hours. CBG: Recent Labs  Lab 11/04/22 0716  GLUCAP 87    Lipid Profile: No results for input(s): "CHOL", "HDL", "LDLCALC", "TRIG", "CHOLHDL", "LDLDIRECT" in the last 72 hours. Thyroid Function Tests: No results for input(s): "TSH", "T4TOTAL", "FREET4", "T3FREE", "THYROIDAB" in the last 72 hours. Anemia Panel: No results for input(s): "VITAMINB12", "FOLATE", "FERRITIN", "TIBC", "IRON", "RETICCTPCT" in the last 72 hours. Sepsis Labs: No results for input(s): "PROCALCITON", "LATICACIDVEN" in the last 168 hours.  Recent Results (from the past 240 hour(s))  Culture, blood (Routine X 2) w Reflex to ID Panel     Status: None   Collection Time: 10/31/22  8:41 AM   Specimen: BLOOD  Result Value Ref Range Status   Specimen Description   Final    BLOOD BLOOD RIGHT ARM Performed at [facility], [address], [place], [place] [address]    Special Requests   Final    BOTTLES DRAWN AEROBIC ONLY Blood Culture adequate volume Performed at [facility], [address] [place], [place] [address]    Culture   Final    NO GROWTH 5 DAYS Performed at [facility], [address], [place], [place] [address]    Report Status 11/05/2022 FINAL  Final  Culture, blood (Routine X 2) w Reflex to ID Panel     Status: None   Collection Time: 10/31/22  8:43 AM   Specimen: BLOOD  Result Value Ref Range Status   Specimen Description   Final    BLOOD BLOOD RIGHT HAND Performed at [facility], [address], [place], [place] [address]    Special Requests   Final    BOTTLES DRAWN AEROBIC ONLY Blood Culture results may not be optimal due to an inadequate volume of blood received in culture bottles Performed at [facility], [address], [place], [place] [address]    Culture   Final    NO GROWTH 5 DAYS Performed at [facility], [address], [place], [place] [address]    Report Status 11/05/2022 FINAL  Final  Urine Culture (for pregnant, neutropenic or urologic patients or patients with an indwelling urinary catheter)     Status: None   Collection Time: 10/31/22 10:29 AM   Specimen: Urine, Catheterized  Result Value Ref Range Status   Specimen Description   Final    URINE, CATHETERIZED Performed at [facility], [address], [place], [place] [address]    Special Requests   Final    Immunocompromised Performed at [facility], [address] [place], [place] [address]    Culture   Final    NO GROWTH Performed at [facility], [address], [place], [place] [address]    Report Status 11/01/2022 FINAL  Final  Culture, blood (Routine X 2) w Reflex to ID Panel     Status: None (Preliminary result)   Collection Time: 11/03/22  7:23 AM   Specimen: BLOOD  Result Value Ref  Range Status   Specimen Description   Final    BLOOD BLOOD RIGHT ARM AEROBIC BOTTLE ONLY Performed at [facility], [address], [place], [place] [address]    Special Requests   Final    BOTTLES DRAWN AEROBIC ONLY Blood Culture results may not be optimal due to an inadequate volume of blood received in culture bottles Performed at [facility], [address], [place], [place] [address]    Culture   Final    NO GROWTH 3 DAYS Performed at [facility], [address], [place], [place] [address]    Report Status PENDING  Incomplete  Culture, blood (Routine X 2) w Reflex to ID Panel     Status: None (Preliminary result)   Collection Time: 11/03/22  7:23 AM   Specimen: BLOOD  Result Value Ref Range Status   Specimen Description   Final    BLOOD BLOOD RIGHT HAND AEROBIC BOTTLE ONLY Performed at [facility], [address], [place], [place] [address]    Special Requests   Final    BOTTLES DRAWN AEROBIC ONLY Blood Culture adequate volume Performed at [facility], [address] [place], [place] [address]    Culture   Final    NO GROWTH 3 DAYS Performed at [facility], [address] [place], [place] [address]    Report Status PENDING  Incomplete  Respiratory (~20 pathogens) panel by PCR     Status: None   Collection Time: 11/03/22  4:07 PM   Specimen: Nasopharyngeal Swab; Respiratory  Result Value Ref Range Status   Adenovirus NOT DETECTED NOT DETECTED Final   Coronavirus 229E NOT DETECTED NOT DETECTED Final    Comment: (NOTE) The Coronavirus on the Respiratory Panel, DOES NOT test for the novel  Coronavirus (2019 nCoV)    Coronavirus HKU1 NOT DETECTED NOT DETECTED Final   Coronavirus NL63 NOT DETECTED NOT DETECTED Final   Coronavirus OC43 NOT DETECTED NOT DETECTED Final   Metapneumovirus NOT DETECTED NOT DETECTED Final   Rhinovirus / Enterovirus NOT DETECTED NOT DETECTED Final    Influenza A NOT DETECTED NOT DETECTED Final   Influenza B NOT DETECTED NOT DETECTED Final   Parainfluenza Virus 1 NOT DETECTED NOT DETECTED Final   Parainfluenza Virus 2 NOT DETECTED NOT DETECTED Final   Parainfluenza Virus 3 NOT DETECTED NOT DETECTED Final   Parainfluenza Virus 4 NOT DETECTED NOT DETECTED Final   Respiratory Syncytial Virus NOT DETECTED NOT DETECTED Final   Bordetella pertussis NOT DETECTED NOT DETECTED Final   Bordetella Parapertussis NOT DETECTED NOT DETECTED Final   Chlamydophila pneumoniae NOT DETECTED NOT DETECTED Final   Mycoplasma pneumoniae NOT DETECTED NOT DETECTED Final    Comment: Performed at [facility], [address] [place], [place] [address]         Radiology Studies: No results found.      Scheduled Meds:  acetaminophen  650 mg Oral Once   amLODipine  2.5 mg Oral Daily   antiseptic oral rinse  15 mL Mouth Rinse Q6H   feeding supplement  237 mL Oral BID BM   folic acid  1 mg Oral Daily   influenza vaccine adjuvanted  0.5 mL Intramuscular Tomorrow-1000   levothyroxine  75 mcg Oral Q0600   magic mouthwash w/lidocaine  10 mL Oral QID   megestrol  400 mg Oral BID   polyethylene glycol  17 g Oral Daily   senna-docusate  1 tablet Oral BID   Continuous Infusions:        Glade Lloyd, MD [facility] 11/07/2022, 10:26  AM

## 2022-11-07 NOTE — Discharge Summary (Signed)
Physician Discharge Summary  [name] [name] DVV:[id number] DOB: October 30, 1934 DOA: 10/28/2022  PCP: Mliss Sax, MD  Admit date: 10/28/2022 Discharge date: 11/07/2022  Admitted From: Home Disposition: Home  Recommendations for Outpatient Follow-up:  Follow up with PCP in 1 week with repeat CBC/BMP Outpatient follow-up with oncology. Recommend outpatient evaluation and follow-up by palliative care if condition does not improve Follow up in ED if symptoms worsen or new appear   Home Health: Home with PT/OT.  Family refused SNF placement. 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 Patient seen in the ED, noted to be pancytopenic and also noted to be in AKI and subsequently admitted for further evaluation and management. She was treated with IV fluids and renal function and improved. She started having fevers for which she was started on broad-spectrum antibiotics. ID was consulted. She was also started on antivirals.  During the hospitalization, her condition has gradually improved.  She has completed a course of antibiotics and antivirals and ID has discontinued all antibiotics at this time.  She is still having intermittent fevers: ID attributes this to neutropenia and improvement in neutropenia.  Oncology has cleared the patient for discharge.  Discharge patient home today.  Outpatient follow-up with PCP and oncology.  Discharge Diagnoses:   Febrile neutropenia Pancytopenia -Oncology and ID following.  Blood cultures negative so far.  Respiratory viral PCR negative.  CT of chest abdomen and pelvis on 11/03/2022 showed small bilateral pleural  effusions and bibasilar atelectasis with no focal consolidation, no acute intra-abdominal or pelvic pathology.  Vanco and meropenem have been discontinued by ID on 11/05/2022.  ID recommended to finish 7-day course of antivirals which patient has completed and antivirals have been discontinued. -Temperature max of 101.4 over the last 24 hours. She is still having intermittent fevers: ID attributes this to neutropenia and improvement in neutropenia. -Off sargramostim.  ANC improving to 1800 today. -ID and oncology have cleared the patient for discharge.  Discharge patient home today.  Outpatient follow-up with PCP and oncology.   Mucositis/stomatitis -Possibly secondary to methotrexate toxicity in the setting of AKI. -Continue Magic mouthwash with lidocaine.   -Slowly improving   AKI superimposed on CKD stage IIIa -Resolved with hydration.  Outpatient follow-up.   Large granular lymphocytic leukemia -Methotrexate on hold.  Outpatient follow-up with oncology regarding timing of resumption of methotrexate   Rheumatoid arthritis -Continue to hold leflunomide till reevaluation by hematology/oncology   Hypertension -ACE inhibitor will not be resumed.  Low-dose amlodipine has been started during this hospitalization which will be continued.  Outpatient follow-up  Hypothyroidism-continue Synthroid   Prediabetes -Jardiance on hold.  Resume on discharge  Physical deconditioning -PT now recommending SNF placement.  Family refusing SNF placement.  Will need home health PT/OT.    Poor oral intake -Patient has been started on [name].  This will be continued on discharge. -If condition does not improve on discharge, recommend palliative care evaluation and follow-up. Discharge Instructions  Discharge Instructions     Diet general   Complete by: As directed    Increase activity slowly   Complete by: As directed       Allergies as of 11/07/2022       Reactions   Tramadol Nausea Only    Codeine  Phosphate Nausea Only        Medication List     STOP taking these medications    benazepril 5 MG tablet Commonly known as: LOTENSIN   leflunomide 10 MG tablet Commonly known as: ARAVA   methotrexate 10 MG tablet Commonly known as: RHEUMATREX   methotrexate 2.5 MG tablet Commonly known as: RHEUMATREX   pravastatin 20 MG tablet Commonly known as: PRAVACHOL   sitaGLIPtin 25 MG tablet Commonly known as: JANUVIA       TAKE these medications    acetaminophen 650 MG CR tablet Commonly known as: TYLENOL Take 1,300 mg by mouth as needed for pain.   acetaminophen 325 MG tablet Commonly known as: TYLENOL Take 650 mg by mouth as needed for headache.   amLODipine 2.5 MG tablet Commonly known as: NORVASC Take 1 tablet (2.5 mg total) by mouth daily. Start taking on: November 08, 2022   empagliflozin 10 MG Tabs tablet Commonly known as: Jardiance Take 1 tablet (10 mg total) by mouth daily before breakfast.   folic acid 1 MG tablet Commonly known as: FOLVITE Take 1 tablet (1 mg total) by mouth daily. Start taking on: November 08, 2022   levothyroxine 75 MCG tablet Commonly known as: SYNTHROID Take 1 tablet (75 mcg total) by mouth daily before breakfast.   magic mouthwash (lidocaine, diphenhydrAMINE, alum & mag hydroxide) suspension Swish and spit 10 mLs 4 (four) times daily.   megestrol 400 MG/10ML suspension Commonly known as: [name] Take 10 mLs (400 mg total) by mouth 2 (two) times daily.   polyethylene glycol 17 g packet Commonly known as: MIRALAX / GLYCOLAX Take 17 g by mouth daily as needed.   senna-docusate 8.6-50 MG tablet Commonly known as: Senokot-S Take 1 tablet by mouth 2 (two) times daily.   SYSTANE OP Place 1 drop into both eyes as needed (dry eye).        Follow-up Information     Mliss Sax, MD. Schedule an appointment as soon as possible for a visit in 1 week(s).   Specialty: Family Medicine Why: with repeat  cbc/bmp Contact information: [address] [place] [address] [phone]         Josph Macho, MD. Schedule an appointment as soon as possible for a visit in 1 week(s).   Specialty: Oncology Contact information: [address] [place] [address] [phone]                Allergies  Allergen Reactions   Tramadol Nausea Only   Codeine Phosphate Nausea Only    Consultations: Oncology/ID    Procedures/Studies: CT CHEST ABDOMEN PELVIS W CONTRAST  Result Date: 11/03/2022 CLINICAL DATA:  Neutropenic fever. EXAM: CT CHEST, ABDOMEN, AND PELVIS WITH CONTRAST TECHNIQUE: Multidetector CT imaging of the chest, abdomen and pelvis was performed following the standard protocol during bolus administration of intravenous contrast. RADIATION DOSE REDUCTION: This exam was performed according to the departmental dose-optimization program which includes automated exposure control, adjustment of the mA and/or kV according to patient size and/or use of iterative reconstruction technique. CONTRAST:  80mL OMNIPAQUE IOHEXOL 300 MG/ML  SOLN COMPARISON:  Chest radiograph dated 10/31/2022. Chest CT dated 09/24/2022. FINDINGS: CT CHEST FINDINGS Cardiovascular: There is no cardiomegaly or pericardial effusion. Coronary vascular calcification of the LAD. Mild atherosclerotic calcification of the thoracic aorta. No aneurysmal dilatation or dissection. The origins of the great vessels of the aortic arch and the central pulmonary arteries appear patent. Mediastinum/Nodes: No hilar or  mediastinal adenopathy. The esophagus is grossly unremarkable. No mediastinal fluid collection. Lungs/Pleura: Small bilateral pleural effusions and bibasilar atelectasis. No focal consolidation, or pneumothorax. The central airways are patent. Musculoskeletal: Degenerative changes of the spine. No acute osseous pathology. CT ABDOMEN PELVIS FINDINGS No intra-abdominal free air or free fluid.  Hepatobiliary: Small cyst in the right lobe of the liver as well as additional subcentimeter hypodense lesions which are too small to characterize. There is mild biliary dilatation, post cholecystectomy. Pancreas: Unremarkable. No pancreatic ductal dilatation or surrounding inflammatory changes. Spleen: Normal in size without focal abnormality. Adrenals/Urinary Tract: The adrenal glands are unremarkable. There is no hydronephrosis on either side. There is symmetric enhancement and excretion of contrast by both kidneys. The visualized ureters and the urinary bladder appear unremarkable. Stomach/Bowel: There is no bowel obstruction or active inflammation. The appendix is not visualized with certainty. No inflammatory changes identified in the right lower quadrant. Vascular/Lymphatic: Mild aortoiliac atherosclerotic disease. The IVC is unremarkable. No portal venous gas. There is no adenopathy. Reproductive: Hysterectomy.  No adnexal masses. Other: None Musculoskeletal: Degenerative changes of the spine. No acute osseous pathology. IMPRESSION: 1. Small bilateral pleural effusions and bibasilar atelectasis. No focal consolidation. 2. No acute intra-abdominal or pelvic pathology. 3.  Aortic Atherosclerosis (ICD10-I70.0). Electronically Signed   By: Elgie Collard M.D.   On: 11/03/2022 21:22   DG Chest 2 View  Result Date: 10/31/2022 CLINICAL DATA:  Fevers. EXAM: CHEST - 2 VIEW COMPARISON:  10/28/2022 FINDINGS: Stable cardiomediastinal contours. No pleural fluid or airspace disease. No interstitial edema. Status post ACDF within the lower cervical spine. IMPRESSION: No active cardiopulmonary abnormalities. Electronically Signed   By: Signa Kell M.D.   On: 10/31/2022 10:05   DG Chest Portable 1 View  Result Date: 10/28/2022 CLINICAL DATA:  weakness EXAM: PORTABLE CHEST - 1 VIEW COMPARISON:  01/30/2013 FINDINGS: Lungs are clear. Heart size and mediastinal contours are within normal limits. No effusion.  Cervical fixation hardware partially visualized. IMPRESSION: No acute cardiopulmonary disease. Electronically Signed   By: Corlis Leak M.D.   On: 10/28/2022 17:08      Subjective: Patient seen and examined at bedside.  No shortness of breath, vomiting, chest pain reported.  Oral intake slightly improving.  Had fever yesterday evening.   Discharge Exam: Vitals:   11/07/22 0200 11/07/22 0427  BP:  121/62  Pulse:  91  Resp: 19 20  Temp:  97.8 F (36.6 C)  SpO2:  100%    General: No acute distress.  On room air currently.  Looks chronically ill and deconditioned. ENT/neck: No obvious JVD elevation or palpable neck masses  respiratory: Bilaterally reemergence of bases with scattered crackles  CVS: Rate mostly controlled; S1 and S2 are heard Abdominal: Soft, nontender, mildly distended; no organomegaly, bowel sounds are heard normally Extremities: No clubbing or cyanosis CNS: Alert and oriented.  Still slow to respond, poor historian.  No focal neurologic deficit.  Able to move extremities Lymph: No cervical lymphadenopathy Skin: No obvious rashes/petechiae psych: No signs of agitation.  Mostly flat affect  musculoskeletal: No obvious joint swelling/deformity    The results of significant diagnostics from this hospitalization (including imaging, microbiology, ancillary and laboratory) are listed below for reference.     Microbiology: Recent Results (from the past 240 hour(s))  Culture, blood (Routine X 2) w Reflex to ID Panel     Status: None   Collection Time: 10/31/22  8:41 AM   Specimen: BLOOD  Result Value Ref Range Status   Specimen  Description   Final    BLOOD BLOOD RIGHT ARM Performed at [facility], [address], [place], [place] [address]    Special Requests   Final    BOTTLES DRAWN AEROBIC ONLY Blood Culture adequate volume Performed at [facility], [address], [place], [place] [address]    Culture   Final    NO GROWTH  5 DAYS Performed at [facility], [address], [place], [place] [address]    Report Status 11/05/2022 FINAL  Final  Culture, blood (Routine X 2) w Reflex to ID Panel     Status: None   Collection Time: 10/31/22  8:43 AM   Specimen: BLOOD  Result Value Ref Range Status   Specimen Description   Final    BLOOD BLOOD RIGHT HAND Performed at [facility], [address] [place], [place] [address]    Special Requests   Final    BOTTLES DRAWN AEROBIC ONLY Blood Culture results may not be optimal due to an inadequate volume of blood received in culture bottles Performed at [facility], [address], [place], [place] [address]    Culture   Final    NO GROWTH 5 DAYS Performed at [facility], [address], [place], [place] [address]    Report Status 11/05/2022 FINAL  Final  Urine Culture (for pregnant, neutropenic or urologic patients or patients with an indwelling urinary catheter)     Status: None   Collection Time: 10/31/22 10:29 AM   Specimen: Urine, Catheterized  Result Value Ref Range Status   Specimen Description   Final    URINE, CATHETERIZED Performed at [facility], [address], [place], [place] [address]    Special Requests   Final    Immunocompromised Performed at [facility], [address], [place], [place] [address]    Culture   Final    NO GROWTH Performed at [facility], [address] [place], [place] [address]    Report Status 11/01/2022 FINAL  Final  Culture, blood (Routine X 2) w Reflex to ID Panel     Status: None (Preliminary result)   Collection Time: 11/03/22  7:23 AM   Specimen: BLOOD  Result Value Ref Range Status   Specimen Description   Final    BLOOD BLOOD RIGHT ARM AEROBIC BOTTLE ONLY Performed at [facility], [address] [place], [place] [address]    Special Requests   Final    BOTTLES DRAWN AEROBIC ONLY Blood  Culture results may not be optimal due to an inadequate volume of blood received in culture bottles Performed at [facility], [address] [place], [place] [address]    Culture   Final    NO GROWTH 4 DAYS Performed at [facility], [address], [place], [place] [address]    Report Status PENDING  Incomplete  Culture, blood (Routine X 2) w Reflex to ID Panel     Status: None (Preliminary result)   Collection Time: 11/03/22  7:23 AM   Specimen: BLOOD  Result Value Ref Range Status   Specimen Description   Final    BLOOD BLOOD RIGHT HAND AEROBIC BOTTLE ONLY Performed at [facility], [address], [place], [place] [address]    Special Requests   Final    BOTTLES DRAWN AEROBIC ONLY Blood Culture adequate volume Performed at [facility], [address] [place], [place] [address]    Culture   Final    NO GROWTH 4 DAYS Performed at [facility], [address], [place], [place] [address]    Report Status PENDING  Incomplete  Respiratory (~20 pathogens) panel by PCR     Status: None   Collection Time: 11/03/22  4:07 PM   Specimen: Nasopharyngeal Swab; Respiratory  Result Value Ref Range Status   Adenovirus NOT DETECTED NOT DETECTED Final   Coronavirus 229E NOT DETECTED NOT DETECTED Final    Comment: (NOTE) The Coronavirus on the Respiratory Panel, DOES NOT test for the novel  Coronavirus (2019 nCoV)    Coronavirus HKU1 NOT DETECTED NOT DETECTED Final   Coronavirus NL63 NOT DETECTED NOT DETECTED Final   Coronavirus OC43 NOT DETECTED NOT DETECTED Final   Metapneumovirus NOT DETECTED NOT DETECTED Final   Rhinovirus / Enterovirus NOT DETECTED NOT DETECTED Final   Influenza A NOT DETECTED NOT DETECTED Final   Influenza B NOT DETECTED NOT DETECTED Final   Parainfluenza Virus 1 NOT DETECTED NOT DETECTED Final   Parainfluenza Virus 2 NOT DETECTED NOT DETECTED Final   Parainfluenza Virus 3 NOT DETECTED NOT  DETECTED Final   Parainfluenza Virus 4 NOT DETECTED NOT DETECTED Final   Respiratory Syncytial Virus NOT DETECTED NOT DETECTED Final   Bordetella pertussis NOT DETECTED NOT DETECTED Final   Bordetella Parapertussis NOT DETECTED NOT DETECTED Final   Chlamydophila pneumoniae NOT DETECTED NOT DETECTED Final   Mycoplasma pneumoniae NOT DETECTED NOT DETECTED Final    Comment: Performed at [facility], [address] [place], [place] [address]     Labs: BNP (last 3 results) No results for input(s): "BNP" in the last 8760 hours. Basic Metabolic Panel: Recent Labs  Lab 11/02/22 0438 11/03/22 0442 11/04/22 0511 11/05/22 0507 11/06/22 0500 11/07/22 0450  NA 137 135 134* 136 135 136  K 4.3 3.9 4.4 4.2 3.9 3.9  CL 108 107 106 105 107 108  CO2 23 22 22 24 23 24   GLUCOSE 92 83 97 116* 112* 112*  BUN 14 12 12 20  25* 22  CREATININE 0.77 0.92 1.09* 1.19* 0.91 0.92  CALCIUM 8.1* 8.1* 8.2* 8.3* 8.1* 8.1*  MG 1.8 2.0 1.8  --  2.0 1.9   Liver Function Tests: Recent Labs  Lab 11/01/22 0532 11/02/22 0438 11/03/22 0442 11/04/22 0511  AST 39 29 31 44*  ALT 23 19 17 21   ALKPHOS 66 56 63 80  BILITOT 0.7 0.7 0.8 1.0  PROT 6.6 5.4* 5.5* 5.5*  ALBUMIN 3.0* 2.6* 2.6* 2.6*   No results for input(s): "LIPASE", "AMYLASE" in the last 168 hours. No results for input(s): "AMMONIA" in the last 168 hours. CBC: Recent Labs  Lab 11/03/22 0442 11/04/22 0511 11/05/22 0507 11/06/22 0500 11/07/22 0450  WBC 2.6* 3.5* 4.0 4.7 6.5  NEUTROABS 0.1* 0.2* 0.3* 0.9* 1.8  HGB 10.0* 10.6* 9.8* 9.9* 9.7*  HCT 31.1* 33.1* 30.4* 30.7* 29.6*  MCV 84.3 82.8 83.5 83.0 82.5  PLT 147* 138* 100* 83* 79*   Cardiac Enzymes: No results for input(s): "CKTOTAL", "CKMB", "CKMBINDEX", "TROPONINI" in the last 168 hours. BNP: Invalid input(s): "POCBNP" CBG: Recent Labs  Lab 11/04/22 0716  GLUCAP 87   D-Dimer No results for input(s): "DDIMER" in the last 72 hours. Hgb A1c No results for input(s): "HGBA1C"  in the last 72 hours. Lipid Profile No results for input(s): "CHOL", "HDL", "LDLCALC", "TRIG", "CHOLHDL", "LDLDIRECT" in the last  72 hours. Thyroid function studies No results for input(s): "TSH", "T4TOTAL", "T3FREE", "THYROIDAB" in the last 72 hours.  Invalid input(s): "FREET3" Anemia work up No results for input(s): "VITAMINB12", "FOLATE", "FERRITIN", "TIBC", "IRON", "RETICCTPCT" in the last 72 hours. Urinalysis    Component Value Date/Time   COLORURINE YELLOW 10/31/2022 1029   APPEARANCEUR CLEAR 10/31/2022 1029   LABSPEC 1.016 10/31/2022 1029   PHURINE 5.0 10/31/2022 1029   GLUCOSEU 150 (A) 10/31/2022 1029   GLUCOSEU NEGATIVE 04/23/2022 1104   HGBUR NEGATIVE 10/31/2022 1029   BILIRUBINUR NEGATIVE 10/31/2022 1029   KETONESUR NEGATIVE 10/31/2022 1029   PROTEINUR NEGATIVE 10/31/2022 1029   UROBILINOGEN 0.2 04/23/2022 1104   NITRITE NEGATIVE 10/31/2022 1029   LEUKOCYTESUR NEGATIVE 10/31/2022 1029   Sepsis Labs Recent Labs  Lab 11/04/22 0511 11/05/22 0507 11/06/22 0500 11/07/22 0450  WBC 3.5* 4.0 4.7 6.5   Microbiology Recent Results (from the past 240 hour(s))  Culture, blood (Routine X 2) w Reflex to ID Panel     Status: None   Collection Time: 10/31/22  8:41 AM   Specimen: BLOOD  Result Value Ref Range Status   Specimen Description   Final    BLOOD BLOOD RIGHT ARM Performed at [facility], [address], [place], [place] [address]    Special Requests   Final    BOTTLES DRAWN AEROBIC ONLY Blood Culture adequate volume Performed at [facility], [address] [place], [place] [address]    Culture   Final    NO GROWTH 5 DAYS Performed at [facility], [address], [place], [place] [address]    Report Status 11/05/2022 FINAL  Final  Culture, blood (Routine X 2) w Reflex to ID Panel     Status: None   Collection Time: 10/31/22  8:43 AM   Specimen: BLOOD  Result Value Ref Range Status   Specimen Description    Final    BLOOD BLOOD RIGHT HAND Performed at [facility], [address], [place], [place] [address]    Special Requests   Final    BOTTLES DRAWN AEROBIC ONLY Blood Culture results may not be optimal due to an inadequate volume of blood received in culture bottles Performed at [facility], [address], [place], [place] [address]    Culture   Final    NO GROWTH 5 DAYS Performed at [facility], [address] [place], [place] [address]    Report Status 11/05/2022 FINAL  Final  Urine Culture (for pregnant, neutropenic or urologic patients or patients with an indwelling urinary catheter)     Status: None   Collection Time: 10/31/22 10:29 AM   Specimen: Urine, Catheterized  Result Value Ref Range Status   Specimen Description   Final    URINE, CATHETERIZED Performed at [facility], [address], [place], [place] [address]    Special Requests   Final    Immunocompromised Performed at [facility], [address] [place], [place] [address]    Culture   Final    NO GROWTH Performed at [facility], [address], [place], [place] [address]    Report Status 11/01/2022 FINAL  Final  Culture, blood (Routine X 2) w Reflex to ID Panel     Status: None (Preliminary result)   Collection Time: 11/03/22  7:23 AM   Specimen: BLOOD  Result Value Ref Range Status   Specimen Description  Final    BLOOD BLOOD RIGHT ARM AEROBIC BOTTLE ONLY Performed at [facility], [address] [place], [place] [address]    Special Requests   Final    BOTTLES DRAWN AEROBIC ONLY Blood Culture results may not be optimal due to an inadequate volume of blood received in culture bottles Performed at [facility], [address] [place], [place] [address]    Culture   Final    NO GROWTH 4 DAYS Performed at [facility], [address] [place], [place] [address]     Report Status PENDING  Incomplete  Culture, blood (Routine X 2) w Reflex to ID Panel     Status: None (Preliminary result)   Collection Time: 11/03/22  7:23 AM   Specimen: BLOOD  Result Value Ref Range Status   Specimen Description   Final    BLOOD BLOOD RIGHT HAND AEROBIC BOTTLE ONLY Performed at [facility], [address] [place], [place] [address]    Special Requests   Final    BOTTLES DRAWN AEROBIC ONLY Blood Culture adequate volume Performed at [facility], [address] [place], [place] [address]    Culture   Final    NO GROWTH 4 DAYS Performed at [facility], [address] [place], [place] [address]    Report Status PENDING  Incomplete  Respiratory (~20 pathogens) panel by PCR     Status: None   Collection Time: 11/03/22  4:07 PM   Specimen: Nasopharyngeal Swab; Respiratory  Result Value Ref Range Status   Adenovirus NOT DETECTED NOT DETECTED Final   Coronavirus 229E NOT DETECTED NOT DETECTED Final    Comment: (NOTE) The Coronavirus on the Respiratory Panel, DOES NOT test for the novel  Coronavirus (2019 nCoV)    Coronavirus HKU1 NOT DETECTED NOT DETECTED Final   Coronavirus NL63 NOT DETECTED NOT DETECTED Final   Coronavirus OC43 NOT DETECTED NOT DETECTED Final   Metapneumovirus NOT DETECTED NOT DETECTED Final   Rhinovirus / Enterovirus NOT DETECTED NOT DETECTED Final   Influenza A NOT DETECTED NOT DETECTED Final   Influenza B NOT DETECTED NOT DETECTED Final   Parainfluenza Virus 1 NOT DETECTED NOT DETECTED Final   Parainfluenza Virus 2 NOT DETECTED NOT DETECTED Final   Parainfluenza Virus 3 NOT DETECTED NOT DETECTED Final   Parainfluenza Virus 4 NOT DETECTED NOT DETECTED Final   Respiratory Syncytial Virus NOT DETECTED NOT DETECTED Final   Bordetella pertussis NOT DETECTED NOT DETECTED Final   Bordetella Parapertussis NOT DETECTED NOT DETECTED Final   Chlamydophila pneumoniae NOT DETECTED NOT DETECTED Final    Mycoplasma pneumoniae NOT DETECTED NOT DETECTED Final    Comment: Performed at [facility], [address] [place], [place] [address]     Time coordinating discharge: 35 minutes  SIGNED:   Glade LloydKshitiz Ayumi Wangerin, MD  [facility] 11/07/2022, 11:16 AM

## 2022-11-07 NOTE — Progress Notes (Signed)
AVS and discharge instructions reviewed w/ patient and patient's son and daughter-in-law. All parties verbalized understanding and had no further questions. Patient's son is the patient's transportation to home.

## 2022-11-07 NOTE — TOC Progression Note (Addendum)
Transition of Care [facility]) - Progression Note    Patient Details  Name: [name] [name] MRN: [id number] Date of Birth: 18-Aug-1934  Transition of Care [facility]) CM/SW Contact  Adrian Prows, RN Phone Number: 11/07/2022, 10:57 AM  Clinical Narrative:    Sherron Monday w/ pt's son Nelva Hoar to offer bed choices; he says he is leaning toward pt going home w/ HH; pt followed by [facility]; Mr Bullion says pt will do better in home environment and their is family available to provide 24 hr care; LVM for Rod Mae at agency to discuss; awaiting return call; pt's son says pt will d/c to [address] [place], [place] [address], and he will contact [facility] so he can determine inf pt will resume services w/ agency; awaiting return call.  -1450- order received for RW; spoke w/ pt in room and she declined; pt says she does just fine with her cane; Dr Hanley Ben notified.  -1452- called pt's son to see if he had contacted [facility] agency; he says he as been in a meeting and will reach out to agency; explained time sensitivity of matter; he verbalized understanding and says he will call back shortly; awaiting return call.  -1520- notified pt's son has been unable to reach his POC at agency; no response for Belenda Cruise; called main office and spoke Karie Kirks; she transferred this [facility] to [facility] [phone]); spoke w/ Marylu Lund; Marylu Lund and pt's son placed on 3-way call; she says [facility] is out of their area; will attempt to secure another agency and notify pt's son.  -1549- called Amy at [place] she says agency can provide HHPT; LVM for pt's son Tawanna Cooler agency contact information placed in d/c instructions; no TOC needs.  Expected Discharge Plan: Skilled Nursing Facility Barriers to Discharge: Continued Medical Work up  Expected Discharge Plan and Services   Discharge Planning Services: CM Consult Post Acute Care Choice: Home Health Living arrangements for the past 2 months: Single Family Home                                        Social Determinants of Health (SDOH) Interventions SDOH Screenings   Food Insecurity: No Food Insecurity (10/28/2022)  Housing: Low Risk  (10/29/2022)  Transportation Needs: No Transportation Needs (10/29/2022)  Utilities: Not At Risk (10/29/2022)  Alcohol Screen: Low Risk  (07/31/2021)  Depression (PHQ2-9): Low Risk  (08/08/2022)  Financial Resource Strain: Low Risk  (08/08/2022)  Physical Activity: Insufficiently Active (08/08/2022)  Social Connections: Moderately Isolated (07/31/2021)  Stress: No Stress Concern Present (08/08/2022)  Tobacco Use: Low Risk  (10/29/2022)    Readmission Risk Interventions    11/04/2022    3:46 PM 09/26/2022   10:27 AM  Readmission Risk Prevention Plan  Transportation Screening Complete Complete  PCP or Specialist Appt within 5-7 Days  Complete  PCP or Specialist Appt within 3-5 Days Complete   Home Care Screening  Complete  Medication Review (RN CM)  Complete  HRI or Home Care Consult Complete   Social Work Consult for Recovery Care Planning/Counseling Complete   Palliative Care Screening Complete   Medication Review Oceanographer) Complete

## 2022-11-08 LAB — CULTURE, BLOOD (ROUTINE X 2)
Culture: NO GROWTH
Culture: NO GROWTH
Special Requests: ADEQUATE

## 2022-11-09 LAB — HISTOPLASMA ANTIGEN, URINE: Histoplasma Antigen, urine: NEGATIVE (ref <0.5)

## 2022-11-10 ENCOUNTER — Ambulatory Visit: Payer: Medicare HMO | Admitting: Family Medicine

## 2022-11-10 ENCOUNTER — Telehealth: Payer: Self-pay

## 2022-11-10 NOTE — Transitions of Care (Post Inpatient/ED Visit) (Signed)
   11/10/2022  Name: YOUSRA [name] MRN: [id number] DOB: 1934-12-27  Today's TOC FU Call Status: Today's TOC FU Call Status:: Successful TOC FU Call Competed TOC FU Call Complete Date: 11/10/22  Transition Care Management Follow-up Telephone Call Date of Discharge: 11/07/22 Discharge Facility: [facility]) Type of Discharge: Inpatient Admission Primary Inpatient Discharge Diagnosis:: Febrile Neutropenia How have [name] [name] been since [name] [name] were released from the hospital?: Better (Patient;s son states she is eating better and today seems much improved) Any questions or concerns?: No  Items Reviewed: Did [name] [name] receive and understand the discharge instructions provided?: Yes Medications obtained and verified?: Yes (Medications Reviewed) Any new allergies since your discharge?: No Dietary orders reviewed?: Yes Type of Diet Ordered:: Regular Do [name] [name] have support at home?: Yes (son- Tawanna Cooler) People in Home: child(ren), adult Name of Support/Comfort Primary Source: [facility] and Equipment/Supplies: Were Home Health Services Ordered?: Yes Name of Home Health Agency:: [phone] Has Agency set up a time to come to your home?: Yes First Home Health Visit Date: 11/11/22 Any new equipment or medical supplies ordered?: NA  Functional Questionnaire: Do [name] [name] need assistance with bathing/showering or dressing?: Yes Do [name] [name] need assistance with meal preparation?: Yes Do [name] [name] need assistance with eating?: No Do [name] [name] have difficulty maintaining continence: No  Follow up appointments reviewed: PCP Follow-up appointment confirmed?: Yes Date of PCP follow-up appointment?: 11/18/22 (Som wants to resschedule, sent message to clinic) Follow-up Provider: Dr. Doreene Burke [facility] Follow-up appointment confirmed?: No Reason Specialist Follow-Up Not Confirmed: Patient has Specialist Provider Number and will Call for Appointment Do [name] [name] need transportation to your follow-up appointment?: No Do [name] [name] understand  care options if your condition(s) worsen?: Yes-patient verbalized understanding  SDOH Interventions Today    Flowsheet Row Most Recent Value  SDOH Interventions   Food Insecurity Interventions Intervention Not Indicated  Housing Interventions Intervention Not Indicated  Transportation Interventions Intervention Not Indicated      Jodelle Gross, RN, BSN, CCM Care Management Coordinator [facility] Phone: [phone]/Fax: [phone]

## 2022-11-12 ENCOUNTER — Telehealth: Payer: Self-pay | Admitting: Family Medicine

## 2022-11-12 ENCOUNTER — Inpatient Hospital Stay: Payer: Medicare HMO | Attending: Hematology & Oncology | Admitting: Dietician

## 2022-11-12 ENCOUNTER — Inpatient Hospital Stay: Payer: Medicare HMO

## 2022-11-12 ENCOUNTER — Encounter: Payer: Self-pay | Admitting: Family

## 2022-11-12 ENCOUNTER — Other Ambulatory Visit: Payer: Medicare HMO | Admitting: Licensed Clinical Social Worker

## 2022-11-12 ENCOUNTER — Inpatient Hospital Stay: Payer: Medicare HMO | Admitting: Family

## 2022-11-12 ENCOUNTER — Inpatient Hospital Stay: Payer: Medicare HMO | Admitting: Licensed Clinical Social Worker

## 2022-11-12 VITALS — BP 136/43 | HR 88 | Temp 98.8°F | Resp 16 | Wt 108.0 lb

## 2022-11-12 DIAGNOSIS — Z79899 Other long term (current) drug therapy: Secondary | ICD-10-CM | POA: Insufficient documentation

## 2022-11-12 DIAGNOSIS — N1832 Chronic kidney disease, stage 3b: Secondary | ICD-10-CM

## 2022-11-12 DIAGNOSIS — D509 Iron deficiency anemia, unspecified: Secondary | ICD-10-CM | POA: Diagnosis not present

## 2022-11-12 DIAGNOSIS — C91Z0 Other lymphoid leukemia not having achieved remission: Secondary | ICD-10-CM | POA: Insufficient documentation

## 2022-11-12 DIAGNOSIS — M069 Rheumatoid arthritis, unspecified: Secondary | ICD-10-CM | POA: Insufficient documentation

## 2022-11-12 DIAGNOSIS — I129 Hypertensive chronic kidney disease with stage 1 through stage 4 chronic kidney disease, or unspecified chronic kidney disease: Secondary | ICD-10-CM | POA: Diagnosis not present

## 2022-11-12 DIAGNOSIS — D649 Anemia, unspecified: Secondary | ICD-10-CM

## 2022-11-12 DIAGNOSIS — E039 Hypothyroidism, unspecified: Secondary | ICD-10-CM | POA: Diagnosis not present

## 2022-11-12 DIAGNOSIS — R110 Nausea: Secondary | ICD-10-CM

## 2022-11-12 DIAGNOSIS — N1831 Chronic kidney disease, stage 3a: Secondary | ICD-10-CM | POA: Diagnosis not present

## 2022-11-12 LAB — CBC WITH DIFFERENTIAL (CANCER CENTER ONLY)
Abs Immature Granulocytes: 0.02 10*3/uL (ref 0.00–0.07)
Basophils Absolute: 0.2 10*3/uL — ABNORMAL HIGH (ref 0.0–0.1)
Basophils Relative: 2 %
Eosinophils Absolute: 0.1 10*3/uL (ref 0.0–0.5)
Eosinophils Relative: 1 %
HCT: 32.3 % — ABNORMAL LOW (ref 36.0–46.0)
Hemoglobin: 10.3 g/dL — ABNORMAL LOW (ref 12.0–15.0)
Immature Granulocytes: 0 %
Lymphocytes Relative: 50 %
Lymphs Abs: 4.1 10*3/uL — ABNORMAL HIGH (ref 0.7–4.0)
MCH: 27.4 pg (ref 26.0–34.0)
MCHC: 31.9 g/dL (ref 30.0–36.0)
MCV: 85.9 fL (ref 80.0–100.0)
Monocytes Absolute: 1 10*3/uL (ref 0.1–1.0)
Monocytes Relative: 12 %
Neutro Abs: 2.9 10*3/uL (ref 1.7–7.7)
Neutrophils Relative %: 35 %
Platelet Count: 304 10*3/uL (ref 150–400)
RBC: 3.76 MIL/uL — ABNORMAL LOW (ref 3.87–5.11)
RDW: 26 % — ABNORMAL HIGH (ref 11.5–15.5)
WBC Count: 8.3 10*3/uL (ref 4.0–10.5)
nRBC: 0 % (ref 0.0–0.2)

## 2022-11-12 LAB — CMP (CANCER CENTER ONLY)
ALT: 15 U/L (ref 0–44)
AST: 20 U/L (ref 15–41)
Albumin: 3.4 g/dL — ABNORMAL LOW (ref 3.5–5.0)
Alkaline Phosphatase: 61 U/L (ref 38–126)
Anion gap: 8 (ref 5–15)
BUN: 16 mg/dL (ref 8–23)
CO2: 24 mmol/L (ref 22–32)
Calcium: 9.4 mg/dL (ref 8.9–10.3)
Chloride: 109 mmol/L (ref 98–111)
Creatinine: 0.94 mg/dL (ref 0.44–1.00)
GFR, Estimated: 59 mL/min — ABNORMAL LOW (ref >60)
Glucose, Bld: 133 mg/dL — ABNORMAL HIGH (ref 70–99)
Potassium: 4.9 mmol/L (ref 3.5–5.1)
Sodium: 141 mmol/L (ref 135–145)
Total Bilirubin: 0.7 mg/dL (ref 0.3–1.2)
Total Protein: 6.7 g/dL (ref 6.5–8.1)

## 2022-11-12 LAB — LACTATE DEHYDROGENASE: LDH: 252 U/L — ABNORMAL HIGH (ref 98–192)

## 2022-11-12 LAB — TSH: TSH: 22.821 u[IU]/mL — ABNORMAL HIGH (ref 0.350–4.500)

## 2022-11-12 LAB — SAVE SMEAR(SSMR), FOR PROVIDER SLIDE REVIEW

## 2022-11-12 NOTE — Progress Notes (Signed)
[facility] CSW Progress Note  Visual merchandiser met with patient and her son, Tawanna Cooler.  Per the request of [employer], [address].  Provided lists of area Adult Care Centers and congregate meal sites.  Todd expressed his concern for patient's decreased appetite.  He is working with patient's medical team to address.  He also disclosed other life stressors.  CSW provided active listening and supportive counseling.    Pascal Lux Somya Jauregui, LCSW

## 2022-11-12 NOTE — Progress Notes (Signed)
Hematology and Oncology Follow Up Visit  [name] [name] [id number] 12/11/1934 88 y.o. 11/12/2022   Principle Diagnosis:  Large granular lymphocytic leukemia Rheumatoid arthritis   Current Therapy:        Methotrexate 20 mg p.o. weekly- start on 07/27/2021 - stopped 11/07/2022 d/y pancytopenia    Interim History:  [name] [name] is here today with her son for follow-up. She is doing fairly well since returning home from the hospital with pancytopenia. She was treated successfully with Leukine.  WBC count is 8.3, ANC 2.9, Hgb 10.3, MCV 85 and platelets 304.  Methotrexate and Arava have been on hold since her hospitalization.  She stayed with her son for 5 days after discharge and then returned to her own home and is living independently. Her biggest issue at this time is eating well. She notes that the [name] has helped increase her appetite and she is doing her best to stay well hydrated. Her weight is stable at 108 lbs.  She denies fever, chills, n/v, cough, rash, dizziness, SOB, chest pain, palpitations, abdominal pain or changes in bowel or bladder habits.  No blood loss noted. No abnormal bruising, no petechiae.  She has mild swelling in her feet and ankles that improves with propping up her feet. Pedal pulses are 2+.  No falls or syncope reported.   ECOG Performance Status: 1 - Symptomatic but completely ambulatory  Medications:  Allergies as of 11/12/2022       Reactions   Tramadol Nausea Only   Codeine Phosphate Nausea Only        Medication List        Accurate as of November 12, 2022  2:41 PM. If you have any questions, ask your nurse or doctor.          acetaminophen 650 MG CR tablet Commonly known as: TYLENOL Take 1,300 mg by mouth as needed for pain.   acetaminophen 325 MG tablet Commonly known as: TYLENOL Take 650 mg by mouth as needed for headache.   amLODipine 2.5 MG tablet Commonly known as: NORVASC Take 1 tablet (2.5 mg total) by mouth daily.    empagliflozin 10 MG Tabs tablet Commonly known as: Jardiance Take 1 tablet (10 mg total) by mouth daily before breakfast.   folic acid 1 MG tablet Commonly known as: FOLVITE Take 1 tablet (1 mg total) by mouth daily.   levothyroxine 75 MCG tablet Commonly known as: SYNTHROID Take 1 tablet (75 mcg total) by mouth daily before breakfast.   magic mouthwash (lidocaine, diphenhydrAMINE, alum & mag hydroxide) suspension Swish and spit 10 mLs 4 (four) times daily.   megestrol 400 MG/10ML suspension Commonly known as: [name] Take 10 mLs (400 mg total) by mouth 2 (two) times daily.   polyethylene glycol 17 g packet Commonly known as: MIRALAX / GLYCOLAX Take 17 g by mouth daily as needed.   senna-docusate 8.6-50 MG tablet Commonly known as: Senokot-S Take 1 tablet by mouth 2 (two) times daily.   SYSTANE OP Place 1 drop into both eyes as needed (dry eye).        Allergies:  Allergies  Allergen Reactions   Tramadol Nausea Only   Codeine Phosphate Nausea Only    Past Medical History, Surgical history, Social history, and Family History were reviewed and updated.  Review of Systems: All other 10 point review of systems is negative.   Physical Exam:  weight is 108 lb (49 kg). Her oral temperature is 98.8 F (37.1 C). Her blood pressure is  136/43 (abnormal) and her pulse is 88. Her respiration is 16 and oxygen saturation is 100%.   Wt Readings from Last 3 Encounters:  11/12/22 108 lb (49 kg)  10/28/22 105 lb 11.2 oz (47.9 kg)  10/09/22 112 lb 12.8 oz (51.2 kg)    Ocular: Sclerae unicteric, pupils equal, round and reactive to light Ear-nose-throat: Oropharynx clear, dentition fair Lymphatic: No cervical or supraclavicular adenopathy Lungs no rales or rhonchi, good excursion bilaterally Heart regular rate and rhythm, no murmur appreciated Abd soft, nontender, positive bowel sounds MSK no focal spinal tenderness, no joint edema Neuro: non-focal, well-oriented,  appropriate affect Breasts: Deferred   Lab Results  Component Value Date   WBC 8.3 11/12/2022   HGB 10.3 (L) 11/12/2022   HCT 32.3 (L) 11/12/2022   MCV 85.9 11/12/2022   PLT 304 11/12/2022   Lab Results  Component Value Date   FERRITIN 266 09/25/2022   IRON 28 09/25/2022   TIBC 193 (L) 09/25/2022   UIBC 165 09/25/2022   IRONPCTSAT 15 09/25/2022   Lab Results  Component Value Date   RETICCTPCT 1.6 09/25/2022   RBC 3.76 (L) 11/12/2022   No results found for: "KPAFRELGTCHN", "LAMBDASER", "KAPLAMBRATIO" Lab Results  Component Value Date   IGGSERUM 1,266 07/15/2021   IGA 424 (H) 07/15/2021   IGMSERUM 194 07/15/2021   No results found for: "TOTALPROTELP", "ALBUMINELP", "A1GS", "A2GS", "BETS", "BETA2SER", "GAMS", "MSPIKE", "SPEI"   Chemistry      Component Value Date/Time   NA 136 11/07/2022 0450   NA 141 02/03/2019 1129   K 3.9 11/07/2022 0450   CL 108 11/07/2022 0450   CO2 24 11/07/2022 0450   BUN 22 11/07/2022 0450   BUN 31 (H) 02/03/2019 1129   CREATININE 0.92 11/07/2022 0450   CREATININE 1.20 (H) 10/03/2022 1226   CREATININE 1.00 (H) 05/20/2021 1545      Component Value Date/Time   CALCIUM 8.1 (L) 11/07/2022 0450   ALKPHOS 80 11/04/2022 0511   AST 44 (H) 11/04/2022 0511   AST 70 (H) 10/03/2022 1226   ALT 21 11/04/2022 0511   ALT 45 (H) 10/03/2022 1226   BILITOT 1.0 11/04/2022 0511   BILITOT 0.6 10/03/2022 1226       Impression and Plan: [name] [name] is a very pleasant 88 yo caucasian female with history of both Large granular lymphocytic leukemia and RA.  She will continue to hold the methotrexate per Dr. Myna HidalgoEnnever.  Ok to restart Arava per MD.  Counts at this time remain stable! We will see her back for follow-up in 1 month.   Eileen StanfordSarah Carter, NP 4/10/20242:41 PM

## 2022-11-12 NOTE — Telephone Encounter (Signed)
Verbal given written will be faxed over

## 2022-11-12 NOTE — Progress Notes (Signed)
Nutrition Follow Up:   Called patient's son Tawanna Cooler to follow up on PO intake after recently discharged from the hospital.  She has been in and out twice since we last spoke.  Mucositis was severe and she stopped eating as pain too severe.  Son took her in right after d/c last hospitalization to his home and she was doing quite well.  He reports she'll tell you not hungry but then been eating pizza, sausage egg biscuit, spaghetti and salad.  Still getting Ensure (at least one at lunch, now Strawberry her favorite).  She has gone back home not eating quite as well when by herself.  He has cameras and can monitor. Home health starting today, and he's trying to get his uncle to go over to eat with her.  Megase is laid out with pill packs so hoping she'll take it.  Most days bed is made, she dresses, she'll reheat soups and cleans up her own dishes.  She's drinking water he checks case to monitor.  He's getting her tested by PCP for dementia.  Recently been through putting in-laws in facility for dementia so he's familiar with differing forms and responses.   Medications: Megase, MMW  Labs: 11/07/22  Hgb 9.7  Anthropometrics: Weight loss continues down 9# past month  Height: 61" Weight:  10/28/22  105.7# 10/03/22  114# BMI: 19.97   Estimated Energy Needs  Kcals: 1400-1700 Protein: 58-72 g Fluid: 2 L   NUTRITION DIAGNOSIS:    Severe Malnutrition related to chronic illness (leukemia) as evidenced by moderate fat depletion, severe muscle depletion, energy intake < or equal to 75% for > or equal to 1 month. Continues   GOAL:    1-2 pounds gain per month  MONITOR:    PO intake, Labs, Weight trends,   Intervention:   Encouraged nutrient dense snack items to have for her.  Encouraged reaching out to family and friends to offer frequent visits especially at mealtimes.     Will ask CSW to call son for resources on potential congregate meal programs.  Emailed tips sheets for high protein, high  calorie snacking and soft moist protein foods to [email or web address].   Next Visit: Remote follow up next month after MD follow up   Gennaro Africa, RDN, LDN Registered Dietitian, [facility] Part Time Remote (Usual office hours: Tuesday-Thursday) Mobile: [phone]   Mobile: [phone] Remote Office: [phone]

## 2022-11-12 NOTE — Telephone Encounter (Signed)
Betsy from [facility] called and her # is [phone] is requesting   1-  physical therapy for 2x for 2 wk  And 1 x week for 3 weeks     2- speech therapy evaluation cognition

## 2022-11-13 ENCOUNTER — Other Ambulatory Visit: Payer: Self-pay | Admitting: Physician Assistant

## 2022-11-13 ENCOUNTER — Telehealth: Payer: Self-pay | Admitting: *Deleted

## 2022-11-13 ENCOUNTER — Other Ambulatory Visit: Payer: Self-pay | Admitting: *Deleted

## 2022-11-13 DIAGNOSIS — Z8639 Personal history of other endocrine, nutritional and metabolic disease: Secondary | ICD-10-CM

## 2022-11-13 MED ORDER — LEVOTHYROXINE SODIUM 88 MCG PO TABS
88.0000 ug | ORAL_TABLET | Freq: Every day | ORAL | 1 refills | Status: DC
Start: 2022-11-13 — End: 2022-12-09

## 2022-11-13 NOTE — Telephone Encounter (Signed)
-----   Message from Josph Macho, MD sent at 11/13/2022  6:39 AM EDT ----- Please call and let her know that the thyroid is still underactive.  She needs to be on higher dose of Synthroid.  Please call in 88 mcg p.o. daily.  Please let her family doctor know about this who manages her hypothyroidism.  Thanks.  Cindee Lame

## 2022-11-13 NOTE — Telephone Encounter (Signed)
As noted below by Dr. Myna Hidalgo, I informed the son that the thyroid is underactive and I will be calling in a new dose of Levothyroxine. Pharmacy verified by son. He will tell his mom of the dose change..from 75 mcg to 88 mcg po daily. He verbalized understanding.

## 2022-11-14 ENCOUNTER — Telehealth: Payer: Self-pay | Admitting: Family Medicine

## 2022-11-14 NOTE — Telephone Encounter (Signed)
Home Health verbal orders Caller Name:Gina Agency Name: [facility] for Speech therapy  Callback number: [phone], vm secure  Requesting OT/PT/Skilled nursing/Social Work/Speech:Speech therapy appt pt and son,Todd are wanting moved from the week of 11/16/22 to 11/23/22.  Reason:  Frequency:  Please forward to [facility] pool or providers CMA

## 2022-11-14 NOTE — Telephone Encounter (Signed)
OK verbal orders? Please advise 

## 2022-11-15 ENCOUNTER — Other Ambulatory Visit: Payer: Self-pay | Admitting: Family Medicine

## 2022-11-15 DIAGNOSIS — I10 Essential (primary) hypertension: Secondary | ICD-10-CM

## 2022-11-15 DIAGNOSIS — N1832 Chronic kidney disease, stage 3b: Secondary | ICD-10-CM

## 2022-11-17 ENCOUNTER — Encounter: Payer: Self-pay | Admitting: Family Medicine

## 2022-11-17 ENCOUNTER — Ambulatory Visit (INDEPENDENT_AMBULATORY_CARE_PROVIDER_SITE_OTHER): Payer: Medicare HMO | Admitting: Family Medicine

## 2022-11-17 ENCOUNTER — Other Ambulatory Visit: Payer: Self-pay | Admitting: Family Medicine

## 2022-11-17 VITALS — BP 130/60 | HR 88 | Temp 98.2°F | Ht 61.0 in | Wt 111.8 lb

## 2022-11-17 DIAGNOSIS — N1832 Chronic kidney disease, stage 3b: Secondary | ICD-10-CM | POA: Diagnosis not present

## 2022-11-17 DIAGNOSIS — K5901 Slow transit constipation: Secondary | ICD-10-CM

## 2022-11-17 DIAGNOSIS — N182 Chronic kidney disease, stage 2 (mild): Secondary | ICD-10-CM

## 2022-11-17 DIAGNOSIS — E039 Hypothyroidism, unspecified: Secondary | ICD-10-CM

## 2022-11-17 DIAGNOSIS — R7303 Prediabetes: Secondary | ICD-10-CM

## 2022-11-17 DIAGNOSIS — R634 Abnormal weight loss: Secondary | ICD-10-CM

## 2022-11-17 DIAGNOSIS — I10 Essential (primary) hypertension: Secondary | ICD-10-CM

## 2022-11-17 MED ORDER — BENAZEPRIL HCL 5 MG PO TABS: ORAL_TABLET | ORAL | 0 refills | Status: DC

## 2022-11-17 MED ORDER — POLYETHYLENE GLYCOL 3350 17 G PO PACK
17.0000 g | PACK | Freq: Every day | ORAL | 0 refills | Status: DC | PRN
Start: 2022-11-17 — End: 2022-11-17

## 2022-11-17 NOTE — Progress Notes (Signed)
Established Patient Office Visit   Subjective:  Patient ID: [name] [name], female    DOB: 07-Jan-1935  Age: 88 y.o. MRN: [id number]  Chief Complaint  Patient presents with   Hospitalization Follow-up    HPI Encounter Diagnoses  Name Primary?   Prediabetes Yes   Acquired hypothyroidism    Essential hypertension    Unintentional weight loss    Constipation by delayed colonic transit    CKD (chronic kidney disease) stage 2, GFR 60-89 ml/min    Essential (primary) hypertension    Chronic kidney disease, stage 3b    For follow-up of the above status post hospital for AKI.  Benzapril was held and she is only taking 2 and half milligrams of amlodipine.  She was discharged home with her son who accompanies her today.  She has had 3 minor falls at his house.  She wanted to go back home to her house.  She continues to live alone.  Her son is checking on her daily.  TSH was found to be significantly elevated.  Oncology increased her levothyroxine dosage.     Review of Systems  Constitutional: Negative.   HENT: Negative.    Eyes:  Negative for blurred vision, discharge and redness.  Respiratory: Negative.    Cardiovascular: Negative.   Gastrointestinal:  Negative for abdominal pain.  Genitourinary: Negative.   Musculoskeletal: Negative.  Negative for myalgias.  Skin:  Negative for rash.  Neurological:  Negative for tingling, loss of consciousness and weakness.  Endo/Heme/Allergies:  Negative for polydipsia.     Current Outpatient Medications:    acetaminophen (TYLENOL) 325 MG tablet, Take 650 mg by mouth as needed for headache., Disp: , Rfl:    acetaminophen (TYLENOL) 650 MG CR tablet, Take 1,300 mg by mouth as needed for pain., Disp: , Rfl:    amLODipine (NORVASC) 2.5 MG tablet, Take 1 tablet (2.5 mg total) by mouth daily., Disp: 30 tablet, Rfl: 0   empagliflozin (JARDIANCE) 10 MG TABS tablet, Take 1 tablet (10 mg total) by mouth daily before breakfast., Disp: 30 tablet, Rfl: 2    folic acid (FOLVITE) 1 MG tablet, Take 1 tablet (1 mg total) by mouth daily., Disp: 30 tablet, Rfl: 0   levothyroxine (SYNTHROID) 88 MCG tablet, Take 1 tablet (88 mcg total) by mouth daily before breakfast., Disp: 30 tablet, Rfl: 1   magic mouthwash (lidocaine, diphenhydrAMINE, alum & mag hydroxide) suspension, Swish and spit 10 mLs 4 (four) times daily., Disp: 360 mL, Rfl: 1   Polyethyl Glycol-Propyl Glycol (SYSTANE OP), Place 1 drop into both eyes as needed (dry eye)., Disp: , Rfl:    senna-docusate (SENOKOT-S) 8.6-50 MG tablet, Take 1 tablet by mouth 2 (two) times daily., Disp: 30 tablet, Rfl: 0   benazepril (LOTENSIN) 5 MG tablet, Take 1 tablet daily., Disp: 90 tablet, Rfl: 0   polyethylene glycol (MIRALAX / GLYCOLAX) 17 g packet, Take 17 g by mouth daily as needed., Disp: 14 each, Rfl: 0   Objective:     BP 130/60 (BP Location: Left Arm, Patient Position: Sitting, Cuff Size: Normal)   Pulse 88   Temp 98.2 F (36.8 C) (Temporal)   Ht 5\' 1"  (1.549 m)   Wt 111 lb 12.8 oz (50.7 kg)   SpO2 99%   BMI 21.12 kg/m    Physical Exam Constitutional:      General: She is not in acute distress.    Appearance: Normal appearance. She is not ill-appearing, toxic-appearing or diaphoretic.  HENT:  Head: Normocephalic and atraumatic.     Right Ear: External ear normal.     Left Ear: External ear normal.  Eyes:     General: No scleral icterus.       Right eye: No discharge.        Left eye: No discharge.     Extraocular Movements: Extraocular movements intact.     Conjunctiva/sclera: Conjunctivae normal.  Cardiovascular:     Rate and Rhythm: Normal rate and regular rhythm.  Pulmonary:     Effort: Pulmonary effort is normal. No respiratory distress.     Breath sounds: Normal breath sounds.  Skin:    General: Skin is warm and dry.  Neurological:     Mental Status: She is alert and oriented to person, place, and time.  Psychiatric:        Mood and Affect: Mood normal.        Behavior:  Behavior normal.      No results found for any visits on 11/17/22.    The ASCVD Risk score (Arnett DK, et al., 2019) failed to calculate for the following reasons:   The 2019 ASCVD risk score is only valid for ages 44 to 15    Assessment & Plan:   Prediabetes  Acquired hypothyroidism  Essential hypertension  Unintentional weight loss  Constipation by delayed colonic transit -     Polyethylene Glycol 3350; Take 17 g by mouth daily as needed.  Dispense: 14 each; Refill: 0  CKD (chronic kidney disease) stage 2, GFR 60-89 ml/min  Essential (primary) hypertension -     Benazepril HCl; Take 1 tablet daily.  Dispense: 90 tablet; Refill: 0  Chronic kidney disease, stage 3b -     Benazepril HCl; Take 1 tablet daily.  Dispense: 90 tablet; Refill: 0    Return in about 6 weeks (around 12/29/2022), or HOLD AMLODIPINE AND RESTART BENZAPRIL. USE GLUCERNA FOR SUPPLEMENT.Marland Kitchen  Hold amlodipine.  Lower Lotensin dose 5 mg daily.  Believe that elevation in TSH is more to do with missed doses of levothyroxine.  Continue with higher dose.  Recheck TSH in 6 weeks.   Mliss Sax, MD

## 2022-11-18 ENCOUNTER — Inpatient Hospital Stay: Payer: Medicare HMO | Admitting: Family Medicine

## 2022-11-18 NOTE — Telephone Encounter (Signed)
Verbal OK orders given

## 2022-11-20 NOTE — Progress Notes (Signed)
Yes, the pancytopenia is due to MTX!!!    Cindee Lame

## 2022-11-26 ENCOUNTER — Telehealth: Payer: Self-pay | Admitting: Family Medicine

## 2022-11-27 NOTE — Telephone Encounter (Signed)
error 

## 2022-12-03 ENCOUNTER — Inpatient Hospital Stay: Payer: Medicare HMO | Attending: Hematology & Oncology | Admitting: Dietician

## 2022-12-03 ENCOUNTER — Telehealth: Payer: Self-pay | Admitting: Dietician

## 2022-12-03 DIAGNOSIS — M069 Rheumatoid arthritis, unspecified: Secondary | ICD-10-CM | POA: Insufficient documentation

## 2022-12-03 DIAGNOSIS — C91Z0 Other lymphoid leukemia not having achieved remission: Secondary | ICD-10-CM | POA: Insufficient documentation

## 2022-12-03 DIAGNOSIS — Z79899 Other long term (current) drug therapy: Secondary | ICD-10-CM | POA: Insufficient documentation

## 2022-12-03 NOTE — Progress Notes (Signed)
Nutrition Follow Up:   Patient's son Tawanna Cooler returned call. He reports appetite is much better. He now has an automatic pill dispenser that was recommended by OT has had it for a week and he follows up with call. He thinks with the better management of her meds she's eating more.  SW put mom on list for meals on wheels, mom too leery of going out without someone she knows so congregate meal sites not an option. Still drinking Ensure.  She's drinking water he checks case to monitor.  He's still concerned with her dementia. He's hopeful that when her nutritional status and blood counts normalize she'll respond mentally.  Medications: Folvite  Labs: 11/12/22  Albumin 3.4, Hgb 10.3 (improving)  Anthropometrics: Weight rebounded 5#  Height: 61" Weight:  11/17/22  111.8# 11/12/22  108# 10/28/22  105.7# 10/03/22  114# BMI: 19.97   Estimated Energy Needs  Kcals: 1400-1700 Protein: 58-72 g Fluid: 2 L   NUTRITION DIAGNOSIS:    Severe Malnutrition related to chronic illness (leukemia) as evidenced by moderate fat depletion, severe muscle depletion, energy intake < or equal to 75% for > or equal to 1 month. Improving   GOAL:   Weight maintenance  MONITOR:    PO intake, Labs, Weight trends,   Intervention:   Encouraged nutrient dense snack items to have for her.  Encouraged continued family visits especially at mealtimes.  Offered resources, declined need at this time.     Next Visit: Remote follow up next month    Gennaro Africa, RDN, LDN Registered Dietitian, [facility] Part Time Remote (Usual office hours: Tuesday-Thursday) Mobile: [phone]   Mobile: [phone] Remote Office: [phone]

## 2022-12-03 NOTE — Telephone Encounter (Signed)
Attempted to reach patient's son for a scheduled remote nutrition follow up. Provided my cell# on voice mail and in text to return call or text back a good time to talk next week. Gennaro Africa, RDN, LDN Registered Dietitian, [facility] Part Time Remote (Usual office hours: Tuesday-Thursday) Cell: [phone]

## 2022-12-09 ENCOUNTER — Other Ambulatory Visit: Payer: Self-pay | Admitting: Hematology & Oncology

## 2022-12-09 DIAGNOSIS — Z8639 Personal history of other endocrine, nutritional and metabolic disease: Secondary | ICD-10-CM

## 2022-12-16 ENCOUNTER — Inpatient Hospital Stay: Payer: Medicare HMO | Admitting: Family

## 2022-12-16 ENCOUNTER — Other Ambulatory Visit: Payer: Self-pay

## 2022-12-16 ENCOUNTER — Inpatient Hospital Stay: Payer: Medicare HMO

## 2022-12-16 ENCOUNTER — Telehealth: Payer: Self-pay

## 2022-12-16 ENCOUNTER — Encounter: Payer: Self-pay | Admitting: Family

## 2022-12-16 VITALS — BP 130/45 | HR 87 | Temp 98.3°F | Resp 16 | Ht 61.5 in | Wt 108.0 lb

## 2022-12-16 DIAGNOSIS — C91Z0 Other lymphoid leukemia not having achieved remission: Secondary | ICD-10-CM

## 2022-12-16 DIAGNOSIS — Z79899 Other long term (current) drug therapy: Secondary | ICD-10-CM | POA: Diagnosis not present

## 2022-12-16 DIAGNOSIS — M069 Rheumatoid arthritis, unspecified: Secondary | ICD-10-CM | POA: Diagnosis not present

## 2022-12-16 DIAGNOSIS — D649 Anemia, unspecified: Secondary | ICD-10-CM

## 2022-12-16 DIAGNOSIS — D708 Other neutropenia: Secondary | ICD-10-CM

## 2022-12-16 LAB — CBC WITH DIFFERENTIAL (CANCER CENTER ONLY)
Abs Immature Granulocytes: 0.01 10*3/uL (ref 0.00–0.07)
Basophils Absolute: 0 10*3/uL (ref 0.0–0.1)
Basophils Relative: 1 %
Eosinophils Absolute: 0 10*3/uL (ref 0.0–0.5)
Eosinophils Relative: 1 %
HCT: 36.8 % (ref 36.0–46.0)
Hemoglobin: 11 g/dL — ABNORMAL LOW (ref 12.0–15.0)
Immature Granulocytes: 0 %
Lymphocytes Relative: 85 %
Lymphs Abs: 4.8 10*3/uL — ABNORMAL HIGH (ref 0.7–4.0)
MCH: 28.4 pg (ref 26.0–34.0)
MCHC: 29.9 g/dL — ABNORMAL LOW (ref 30.0–36.0)
MCV: 95.1 fL (ref 80.0–100.0)
Monocytes Absolute: 0.4 10*3/uL (ref 0.1–1.0)
Monocytes Relative: 8 %
Neutro Abs: 0.3 10*3/uL — CL (ref 1.7–7.7)
Neutrophils Relative %: 5 %
Platelet Count: 213 10*3/uL (ref 150–400)
RBC: 3.87 MIL/uL (ref 3.87–5.11)
RDW: 16.8 % — ABNORMAL HIGH (ref 11.5–15.5)
Smear Review: ADEQUATE
WBC Count: 5.6 10*3/uL (ref 4.0–10.5)
WBC Morphology: ABNORMAL
nRBC: 0 % (ref 0.0–0.2)

## 2022-12-16 LAB — CMP (CANCER CENTER ONLY)
ALT: 17 U/L (ref 0–44)
AST: 24 U/L (ref 15–41)
Albumin: 3.9 g/dL (ref 3.5–5.0)
Alkaline Phosphatase: 68 U/L (ref 38–126)
Anion gap: 11 (ref 5–15)
BUN: 23 mg/dL (ref 8–23)
CO2: 23 mmol/L (ref 22–32)
Calcium: 9 mg/dL (ref 8.9–10.3)
Chloride: 105 mmol/L (ref 98–111)
Creatinine: 1.05 mg/dL — ABNORMAL HIGH (ref 0.44–1.00)
GFR, Estimated: 52 mL/min — ABNORMAL LOW (ref >60)
Glucose, Bld: 96 mg/dL (ref 70–99)
Potassium: 4.6 mmol/L (ref 3.5–5.1)
Sodium: 139 mmol/L (ref 135–145)
Total Bilirubin: 0.4 mg/dL (ref 0.3–1.2)
Total Protein: 8.4 g/dL — ABNORMAL HIGH (ref 6.5–8.1)

## 2022-12-16 LAB — SAVE SMEAR(SSMR), FOR PROVIDER SLIDE REVIEW

## 2022-12-16 LAB — LACTATE DEHYDROGENASE: LDH: 161 U/L (ref 98–192)

## 2022-12-16 NOTE — Telephone Encounter (Signed)
Critical anc of 0.3 received from lab, NP informed.

## 2022-12-16 NOTE — Progress Notes (Unsigned)
Hematology and Oncology Follow Up Visit  [name] [name] [id number] 1935/07/10 88 y.o. 12/16/2022   Principle Diagnosis:  Large granular lymphocytic leukemia Rheumatoid arthritis   Current Therapy:        Methotrexate 20 mg p.o. weekly- start on 07/27/2021 - stopped 11/07/2022 d/y pancytopenia    Interim History:  [name] [name] is here today with her son for follow-up. She is doing well and has no complaints at this time.  WBC count is 5.6, ANC is down to 0.3, Hgb 11.0, MCV 95 and platelets 213.  No issue with infection. No fever, chills, n/v, cough, rash, dizziness, SOB, chest pain, palpitations, abdominal pain or changes in bowel or bladder habits.  No blood loss noted. No abnormal bruising, no petechiae.  No swelling, tenderness, numbness or tingling in her extremities.  No falls or syncope reported.  Since starting synthroid she is eating a little better. She admits that she needs to better hydrate throughout the day. Her weight is stable at 111 lbs.   ECOG Performance Status: 1 - Symptomatic but completely ambulatory  Medications:  Allergies as of 12/16/2022       Reactions   Tramadol Nausea Only   Codeine Phosphate Nausea Only        Medication List        Accurate as of Dec 16, 2022  2:28 PM. If you have any questions, ask your nurse or doctor.          acetaminophen 650 MG CR tablet Commonly known as: TYLENOL Take 1,300 mg by mouth as needed for pain.   acetaminophen 325 MG tablet Commonly known as: TYLENOL Take 650 mg by mouth as needed for headache.   amLODipine 2.5 MG tablet Commonly known as: NORVASC Take 1 tablet (2.5 mg total) by mouth daily.   benazepril 5 MG tablet Commonly known as: LOTENSIN Take 1 tablet daily.   empagliflozin 10 MG Tabs tablet Commonly known as: Jardiance Take 1 tablet (10 mg total) by mouth daily before breakfast.   folic acid 1 MG tablet Commonly known as: FOLVITE Take 1 tablet (1 mg total) by mouth daily.    levothyroxine 88 MCG tablet Commonly known as: SYNTHROID TAKE 1 TABLET BY MOUTH DAILY BEFORE BREAKFAST.   magic mouthwash (lidocaine, diphenhydrAMINE, alum & mag hydroxide) suspension Swish and spit 10 mLs 4 (four) times daily.   polyethylene glycol powder 17 GM/SCOOP powder Commonly known as: GLYCOLAX/MIRALAX DISSOLVE 17 GRAMS IN 8 OZ OF FLUID LIQUID DRINK DAILY AS DIRECTED   senna-docusate 8.6-50 MG tablet Commonly known as: Senokot-S Take 1 tablet by mouth 2 (two) times daily.        Allergies:  Allergies  Allergen Reactions   Tramadol Nausea Only   Codeine Phosphate Nausea Only    Past Medical History, Surgical history, Social history, and Family History were reviewed and updated.  Review of Systems: All other 10 point review of systems is negative.   Physical Exam:  height is 5' 1.5" (1.562 m) and weight is 108 lb (49 kg). Her oral temperature is 98.3 F (36.8 C). Her blood pressure is 130/45 (abnormal) and her pulse is 87. Her respiration is 16 and oxygen saturation is 100%.   Wt Readings from Last 3 Encounters:  12/16/22 108 lb (49 kg)  11/17/22 111 lb 12.8 oz (50.7 kg)  11/12/22 108 lb (49 kg)    Ocular: Sclerae unicteric, pupils equal, round and reactive to light Ear-nose-throat: Oropharynx clear, dentition fair Lymphatic: No cervical or supraclavicular adenopathy  Lungs no rales or rhonchi, good excursion bilaterally Heart regular rate and rhythm, no murmur appreciated Abd soft, nontender, positive bowel sounds MSK no focal spinal tenderness, no joint edema Neuro: non-focal, well-oriented, appropriate affect Breasts: Deferred   Lab Results  Component Value Date   WBC 5.6 12/16/2022   HGB 11.0 (L) 12/16/2022   HCT 36.8 12/16/2022   MCV 95.1 12/16/2022   PLT 213 12/16/2022   Lab Results  Component Value Date   FERRITIN 266 09/25/2022   IRON 28 09/25/2022   TIBC 193 (L) 09/25/2022   UIBC 165 09/25/2022   IRONPCTSAT 15 09/25/2022   Lab Results   Component Value Date   RETICCTPCT 1.6 09/25/2022   RBC 3.87 12/16/2022   No results found for: "KPAFRELGTCHN", "LAMBDASER", "KAPLAMBRATIO" Lab Results  Component Value Date   IGGSERUM 1,266 07/15/2021   IGA 424 (H) 07/15/2021   IGMSERUM 194 07/15/2021   No results found for: "TOTALPROTELP", "ALBUMINELP", "A1GS", "A2GS", "BETS", "BETA2SER", "GAMS", "MSPIKE", "SPEI"   Chemistry      Component Value Date/Time   NA 139 12/16/2022 1340   NA 141 02/03/2019 1129   K 4.6 12/16/2022 1340   CL 105 12/16/2022 1340   CO2 23 12/16/2022 1340   BUN 23 12/16/2022 1340   BUN 31 (H) 02/03/2019 1129   CREATININE 1.05 (H) 12/16/2022 1340   CREATININE 1.00 (H) 05/20/2021 1545      Component Value Date/Time   CALCIUM 9.0 12/16/2022 1340   ALKPHOS 68 12/16/2022 1340   AST 24 12/16/2022 1340   ALT 17 12/16/2022 1340   BILITOT 0.4 12/16/2022 1340       Impression and Plan: [name] [name] is a very pleasant 88 yo caucasian female with history of both Large granular lymphocytic leukemia and RA.  We will get her set up for Leukine injection 3 days in a row asap. Dr. Myna Hidalgo and pharmacy working on entering order set for our infusion center.  Lab recheck in 1 week.  Follow-up in 2 weeks.   [name], NP 5/14/20242:28 PM

## 2022-12-17 ENCOUNTER — Other Ambulatory Visit: Payer: Self-pay | Admitting: Hematology & Oncology

## 2022-12-17 ENCOUNTER — Encounter: Payer: Self-pay | Admitting: Family

## 2022-12-17 NOTE — Progress Notes (Signed)
Office Visit Note  Patient: [name] [name]             Date of Birth: June 12, 1935           MRN: [id number]             PCP: Mliss Sax, MD Referring: Mliss Sax,* Visit Date: 12/31/2022 Occupation: @GUAROCC @  Subjective:  Medication monitoring   History of Present Illness: [name] [name] is a 88 y.o. female with history of seropositive rheumatoid arthritis and osteoarthritis.  Patient  is prescribed Arava 10 mg 1 tablet by mouth daily for rheumatoid arthritis.  She was advised to discontinue methotrexate on 11/07/2022 due to pancytopenia.  Methotrexate was previously being prescribed by Dr. Myna Hidalgo for LGL leukemia.  The patient was accompanied by and her son today in the office.  According to her son she had been taking methotrexate more frequently than prescribed.  She has since got an automated pill dispenser which has been helpful for her to keep up with her medications as prescribed. Patient had 3 leukine injections and will be having 3 more injections next week on Monday, Tuesday, and Wednesday.   She denies any signs or symptoms of a rheumatoid arthritis flare.  She is not currently experiencing any joint pain.  She has not had any nocturnal symptoms.  She denies any morning stiffness.    Activities of Daily Living:  Patient reports morning stiffness for 0 minutes.   Patient Denies nocturnal pain.  Difficulty dressing/grooming: Denies Difficulty climbing stairs: Denies Difficulty getting out of chair: Denies Difficulty using hands for taps, buttons, cutlery, and/or writing: Denies  Review of Systems  Constitutional:  Negative for fatigue.  HENT:  Negative for mouth sores and mouth dryness.   Eyes:  Negative for dryness.  Respiratory:  Negative for shortness of breath.   Cardiovascular:  Negative for chest pain and palpitations.  Gastrointestinal:  Negative for blood in stool, constipation and diarrhea.  Endocrine: Negative for increased urination.   Genitourinary:  Negative for involuntary urination.  Musculoskeletal:  Negative for joint pain, gait problem, joint pain, joint swelling, myalgias, muscle weakness, morning stiffness, muscle tenderness and myalgias.  Skin:  Negative for color change, rash, hair loss and sensitivity to sunlight.  Allergic/Immunologic: Negative for susceptible to infections.  Neurological:  Negative for dizziness and headaches.  Hematological:  Negative for swollen glands.  Psychiatric/Behavioral:  Negative for depressed mood and sleep disturbance. The patient is not nervous/anxious.     PMFS History:  Patient Active Problem List   Diagnosis Date Noted   Constipation by delayed colonic transit 11/17/2022   Aphthous ulcer 11/04/2022   Neutropenic fever (HCC) 10/31/2022   Dehydration 10/29/2022   AKI (acute kidney injury) (HCC) 10/28/2022   Pancytopenia (HCC) 10/28/2022   Stomatitis 10/28/2022   Pre-diabetes 10/28/2022   Protein-calorie malnutrition, severe 09/25/2022   Acute diarrhea 09/24/2022   Unintentional weight loss 09/24/2022   Impaired mental alertness 09/24/2022   Acute renal failure superimposed on stage 3a chronic kidney disease (HCC) 09/24/2022   Need for RSV immunization 04/23/2022   Prediabetes 04/23/2022   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   Goals of care, counseling/discussion 08/06/2021   Need for influenza vaccination 04/18/2021   Iron deficiency anemia 11/20/2020   Lymphocytosis 10/16/2020   Neutropenia (HCC) 10/16/2020   Paroxysmal supraventricular tachycardia 10/15/2020   Elevated LDL cholesterol level 01/12/2020   DDD (degenerative disc disease), cervical 07/13/2018   Chronic kidney disease (CKD) stage G3b 01/21/2017  History of hypothyroidism 01/20/2017   History of congestive heart failure 08/26/2016   Osteopenia of multiple sites 08/25/2016   DDD cervical spine 08/25/2016   Primary osteoarthritis of both hands 08/25/2016   High risk medication use  06/11/2016   CHF, acute (HCC) 02/01/2013   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   Hypothyroidism 01/19/2007   Dyslipidemia 01/19/2007   Essential hypertension 01/19/2007   Rheumatoid arthritis (HCC) 01/19/2007    Past Medical History:  Diagnosis Date   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   Rheumatoid arthritis(714.0) 01/19/2007    Family History  Problem Relation Age of Onset   Diabetes Father    Heart disease Father    Cancer Sister        breast    Heart disease Sister    Heart attack Brother    Heart attack Brother    Heart attack Brother    Past Surgical History:  Procedure Laterality Date   ABDOMINAL HYSTERECTOMY     APPENDECTOMY     CHOLECYSTECTOMY     NECK SURGERY     Social History   Social History Narrative   Not on file   Immunization History  Administered Date(s) Administered   Fluad Quad(high Dose 65+) 05/06/2019, 04/18/2021   Influenza,inj,Quad PF,6+ Mos 07/06/2013   Pneumococcal Conjugate-13 07/06/2013   Pneumococcal Polysaccharide-23 04/09/2011, 06/21/2012   Tdap 04/09/2011     Objective: Vital Signs: BP (!) 120/58 (BP Location: Left Arm, Patient Position: Sitting, Cuff Size: Normal)   Pulse 85   Resp 14   Ht 5\' 1"  (1.549 m)   Wt 113 lb 12.8 oz (51.6 kg)   BMI 21.50 kg/m    Physical Exam Vitals and nursing note reviewed.  Constitutional:      Appearance: She is well-developed.  HENT:     Head: Normocephalic and atraumatic.  Eyes:     Conjunctiva/sclera: Conjunctivae normal.  Cardiovascular:     Rate and Rhythm: Normal rate and regular rhythm.     Heart sounds: Normal heart sounds.  Pulmonary:     Effort: Pulmonary effort is normal.     Breath sounds: Normal breath sounds.  Abdominal:     General: Bowel sounds are normal.     Palpations: Abdomen is soft.  Musculoskeletal:     Cervical back:  Normal range of motion.  Lymphadenopathy:     Cervical: No cervical adenopathy.  Skin:    General: Skin is warm and dry.     Capillary Refill: Capillary refill takes less than 2 seconds.  Neurological:     Mental Status: She is alert and oriented to person, place, and time.  Psychiatric:        Behavior: Behavior normal.      Musculoskeletal Exam: C-spine has limited range of motion with lateral rotation.  Shoulder joints, elbow joints, wrist joints, MCPs, PIPs, DIPs have good range of motion with no synovitis.  Complete fist formation bilaterally.  Hip joints have good range of motion with no groin pain.  Knee joints have good range of motion with no warmth or effusion.  Ankle joints have good range of motion with no tenderness or joint swelling.  CDAI Exam: CDAI Score: -- Patient Global: 0 mm; Provider Global: 0 mm Swollen: --; Tender: -- Joint Exam 12/31/2022   No joint exam has been documented for this visit   There is currently no information documented on  the homunculus. Go to the Rheumatology activity and complete the homunculus joint exam.  Investigation: No additional findings.  Imaging: No results found.  Recent Labs: Lab Results  Component Value Date   WBC 5.6 12/16/2022   HGB 11.0 (L) 12/16/2022   PLT 213 12/16/2022   NA 139 12/16/2022   K 4.6 12/16/2022   CL 105 12/16/2022   CO2 23 12/16/2022   GLUCOSE 96 12/16/2022   BUN 23 12/16/2022   CREATININE 1.05 (H) 12/16/2022   BILITOT 0.4 12/16/2022   ALKPHOS 68 12/16/2022   AST 24 12/16/2022   ALT 17 12/16/2022   PROT 8.4 (H) 12/16/2022   ALBUMIN 3.9 12/16/2022   CALCIUM 9.0 12/16/2022   GFRAA 50 (L) 08/09/2020    Speciality Comments: PLQ Eye Exam: 10/04/2019 @ [facility]  Prior therapy: Methotrexate (lung injury) NOT CURRENTLY ON PLQ  Procedures:  No procedures performed Allergies: Tramadol and Codeine phosphate   Assessment / Plan:     Visit Diagnoses: Rheumatoid arthritis involving  multiple sites with positive rheumatoid factor (HCC): She has no joint tenderness or synovitis on examination today.  She has not had any signs or symptoms of a rheumatoid arthritis flare.  She has not been experiencing any morning stiffness, nocturnal pain, or difficulty with ADLs.  She remains on Arava 10 mg 1 tablet by mouth daily.  She is tolerating low-dose Arava without any side effects or recurrent infections.  She will be having close lab monitoring ordered by Dr. Myna Hidalgo. No medication changes will be made at this time. She was advised to notify us if she develops signs or symptoms of a flare.  She will follow-up in the office in 4 months or sooner if needed.  High risk medication use - Arava 10 mg 1 tablet by mouth daily for rheumatoid arthritis. Methotrexate was previously prescribed by Dr. Myna Hidalgo for LGL leukemia from December 2022 till 11/07/2022.  Methotrexate was discontinued due to pancytopenia. CBC and CMP updated on 12/16/22.  Absolute neutrophils were significantly low-0.3.  She will be having close lab monitoring with her oncologist.  Scheduled for leukine injections x3 days next week.  Discussed the importance of holding arava  if she develops signs or symptoms of an infection and to resume once the infection has completely cleared.   Primary osteoarthritis of both hands: No tenderness or inflammation noted.  Complete fist formation bilaterally.  DDD (degenerative disc disease), cervical: Limited range of motion with lateral rotation.  Trochanteric bursitis, right hip: Resolved.  Osteopenia of multiple sites: DEXA ordered by Dr. Roswell Nickel completed yet.   Chronic kidney disease (CKD) stage G3b: Creatinine 1.05 and GFR 52 on 12/16/22.   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   Methotrexate was previously prescribed by Dr. Myna Hidalgo for LGL leukemia from December 2022 till 11/07/2022.  Methotrexate was discontinued due to pancytopenia. According to  the patient's son she was taking methotrexate on a daily basis at a higher dose than prescribed.  She now has a pill dispenser which has been helpful in keeping her medications organized. She was given the okay to resume Arava on 11/12/2022. Reviewed oncology office visit note from 12/16/2022: Leukine injections x 3 days scheduled for next week.  She will continue to require close lab monitoring.  Other neutropenia [facility]): Under the care of Dr. Myna Hidalgo. She is scheduled for Leukine injections x3 days next week.  She will continue to require close lab monitoring.   Other medical conditions are listed as follows:  History of iron deficiency anemia  History of congestive heart failure  Essential hypertension: Blood pressure was 120/58 today in the office.   Dyslipidemia  History of hypothyroidism  Orders: No orders of the defined types were placed in this encounter.  No orders of the defined types were placed in this encounter.    Follow-Up Instructions: Return in about 4 months (around 05/03/2023) for Rheumatoid arthritis, Osteoarthritis.   Gearldine Bienenstock, PA-C  Note - This record has been created using Dragon software.  Chart creation errors have been sought, but may not always  have been located. Such creation errors do not reflect on  the standard of medical care.

## 2022-12-19 ENCOUNTER — Other Ambulatory Visit: Payer: Self-pay | Admitting: Family

## 2022-12-19 ENCOUNTER — Other Ambulatory Visit: Payer: Self-pay | Admitting: Pharmacist

## 2022-12-19 ENCOUNTER — Other Ambulatory Visit: Payer: Self-pay | Admitting: Hematology & Oncology

## 2022-12-30 ENCOUNTER — Encounter: Payer: Self-pay | Admitting: Family Medicine

## 2022-12-30 ENCOUNTER — Ambulatory Visit (INDEPENDENT_AMBULATORY_CARE_PROVIDER_SITE_OTHER): Payer: Medicare HMO | Admitting: Family Medicine

## 2022-12-30 VITALS — BP 128/64 | HR 87 | Temp 98.3°F | Ht 61.0 in | Wt 113.8 lb

## 2022-12-30 DIAGNOSIS — E039 Hypothyroidism, unspecified: Secondary | ICD-10-CM

## 2022-12-30 DIAGNOSIS — I10 Essential (primary) hypertension: Secondary | ICD-10-CM | POA: Diagnosis not present

## 2022-12-30 DIAGNOSIS — R7303 Prediabetes: Secondary | ICD-10-CM | POA: Diagnosis not present

## 2022-12-30 LAB — TSH: TSH: 0.9 u[IU]/mL (ref 0.35–5.50)

## 2022-12-30 NOTE — Progress Notes (Signed)
Established Patient Office Visit   Subjective:  Patient ID: [name] [name], female    DOB: [birth year]  Age: 88 y.o. MRN: [id number]  Chief Complaint  Patient presents with   Medical Management of Chronic Issues    6 week follow up, no concerns. Patient fasting.     HPI Encounter Diagnoses  Name Primary?   Prediabetes Yes   Acquired hypothyroidism    Essential hypertension    Follow-up of the above.  Blood pressure has been stable on 5 mg of Lotensin without amlodipine.  She is accompanied by her son today who is laying down her pills through an Dance movement psychotherapist daily.  She has been living independently well.  Meals on Wheels is involved.  She has been able to gain 5 pounds.  Continues 10 mg of Jardiance.  Continues 88 mcg of levothyroxine.   Review of Systems  Constitutional: Negative.   HENT: Negative.    Eyes:  Negative for blurred vision, discharge and redness.  Respiratory: Negative.    Cardiovascular: Negative.   Gastrointestinal:  Negative for abdominal pain.  Genitourinary: Negative.   Musculoskeletal: Negative.  Negative for myalgias.  Skin:  Negative for rash.  Neurological:  Negative for tingling, loss of consciousness and weakness.  Endo/Heme/Allergies:  Negative for polydipsia.     Current Outpatient Medications:    acetaminophen (TYLENOL) 325 MG tablet, Take 650 mg by mouth as needed for headache., Disp: , Rfl:    benazepril (LOTENSIN) 5 MG tablet, Take 1 tablet daily., Disp: 90 tablet, Rfl: 0   empagliflozin (JARDIANCE) 10 MG TABS tablet, Take 1 tablet (10 mg total) by mouth daily before breakfast., Disp: 30 tablet, Rfl: 2   leflunomide (ARAVA) 10 MG tablet, Take 10 mg by mouth daily., Disp: , Rfl:    levothyroxine (SYNTHROID) 88 MCG tablet, TAKE 1 TABLET BY MOUTH DAILY BEFORE BREAKFAST., Disp: 90 tablet, Rfl: 1   senna-docusate (SENOKOT-S) 8.6-50 MG tablet, Take 1 tablet by mouth 2 (two) times daily. (Patient not taking: Reported on 12/30/2022), Disp: 30 tablet,  Rfl: 0   Objective:     BP 128/64 (BP Location: Right Arm, Patient Position: Sitting, Cuff Size: Normal)   Pulse 87   Temp 98.3 F (36.8 C) (Temporal)   Ht 5\' 1"  (1.549 m)   Wt 113 lb 12.8 oz (51.6 kg)   SpO2 97%   BMI 21.50 kg/m  Wt Readings from Last 3 Encounters:  12/30/22 113 lb 12.8 oz (51.6 kg)  12/16/22 108 lb (49 kg)  11/17/22 111 lb 12.8 oz (50.7 kg)      Physical Exam Constitutional:      General: She is not in acute distress.    Appearance: Normal appearance. She is not ill-appearing, toxic-appearing or diaphoretic.  HENT:     Head: Normocephalic and atraumatic.     Right Ear: External ear normal.     Left Ear: External ear normal.     Mouth/Throat:     Mouth: Mucous membranes are moist.     Pharynx: Oropharynx is clear. No oropharyngeal exudate or posterior oropharyngeal erythema.  Eyes:     General: No scleral icterus.       Right eye: No discharge.        Left eye: No discharge.     Extraocular Movements: Extraocular movements intact.     Conjunctiva/sclera: Conjunctivae normal.     Pupils: Pupils are equal, round, and reactive to light.  Cardiovascular:     Rate and Rhythm: Normal rate and  regular rhythm.     Heart sounds: Murmur heard.  Pulmonary:     Effort: Pulmonary effort is normal. No respiratory distress.     Breath sounds: Normal breath sounds.  Abdominal:     General: Bowel sounds are normal.  Musculoskeletal:     Cervical back: No rigidity or tenderness.  Skin:    General: Skin is warm and dry.  Neurological:     Mental Status: She is alert and oriented to person, place, and time.  Psychiatric:        Mood and Affect: Mood normal.        Behavior: Behavior normal.      No results found for any visits on 12/30/22.    The ASCVD Risk score (Arnett DK, et al., 2019) failed to calculate for the following reasons:   The 2019 ASCVD risk score is only valid for ages 30 to 44    Assessment & Plan:   Prediabetes  Acquired  hypothyroidism -     TSH  Essential hypertension    Return in about 3 months (around 04/01/2023).  Continue current medications.  Follow-up in 3 months.  Mliss Sax, MD

## 2022-12-31 ENCOUNTER — Ambulatory Visit: Payer: Medicare HMO | Attending: Physician Assistant | Admitting: Physician Assistant

## 2022-12-31 ENCOUNTER — Encounter: Payer: Self-pay | Admitting: Physician Assistant

## 2022-12-31 VITALS — BP 120/58 | HR 85 | Resp 14 | Ht 61.0 in | Wt 113.8 lb

## 2022-12-31 DIAGNOSIS — M19041 Primary osteoarthritis, right hand: Secondary | ICD-10-CM

## 2022-12-31 DIAGNOSIS — M7061 Trochanteric bursitis, right hip: Secondary | ICD-10-CM | POA: Diagnosis not present

## 2022-12-31 DIAGNOSIS — I10 Essential (primary) hypertension: Secondary | ICD-10-CM

## 2022-12-31 DIAGNOSIS — M0579 Rheumatoid arthritis with rheumatoid factor of multiple sites without organ or systems involvement: Secondary | ICD-10-CM

## 2022-12-31 DIAGNOSIS — M5030 Other cervical disc degeneration, unspecified cervical region: Secondary | ICD-10-CM

## 2022-12-31 DIAGNOSIS — D708 Other neutropenia: Secondary | ICD-10-CM | POA: Diagnosis not present

## 2022-12-31 DIAGNOSIS — Z79899 Other long term (current) drug therapy: Secondary | ICD-10-CM

## 2022-12-31 DIAGNOSIS — Z862 Personal history of diseases of the blood and blood-forming organs and certain disorders involving the immune mechanism: Secondary | ICD-10-CM

## 2022-12-31 DIAGNOSIS — N1832 Chronic kidney disease, stage 3b: Secondary | ICD-10-CM | POA: Diagnosis not present

## 2022-12-31 DIAGNOSIS — Z8679 Personal history of other diseases of the circulatory system: Secondary | ICD-10-CM

## 2022-12-31 DIAGNOSIS — E785 Hyperlipidemia, unspecified: Secondary | ICD-10-CM

## 2022-12-31 DIAGNOSIS — M19042 Primary osteoarthritis, left hand: Secondary | ICD-10-CM

## 2022-12-31 DIAGNOSIS — M8589 Other specified disorders of bone density and structure, multiple sites: Secondary | ICD-10-CM | POA: Diagnosis not present

## 2022-12-31 DIAGNOSIS — Z8639 Personal history of other endocrine, nutritional and metabolic disease: Secondary | ICD-10-CM

## 2022-12-31 DIAGNOSIS — C91Z0 Other lymphoid leukemia not having achieved remission: Secondary | ICD-10-CM | POA: Diagnosis not present

## 2023-01-05 ENCOUNTER — Inpatient Hospital Stay: Payer: Medicare HMO | Attending: Hematology & Oncology

## 2023-01-05 ENCOUNTER — Inpatient Hospital Stay: Payer: Medicare HMO

## 2023-01-05 VITALS — BP 150/45 | HR 83 | Temp 97.9°F | Resp 18

## 2023-01-05 DIAGNOSIS — D61818 Other pancytopenia: Secondary | ICD-10-CM | POA: Insufficient documentation

## 2023-01-05 DIAGNOSIS — C91Z0 Other lymphoid leukemia not having achieved remission: Secondary | ICD-10-CM | POA: Diagnosis not present

## 2023-01-05 DIAGNOSIS — M069 Rheumatoid arthritis, unspecified: Secondary | ICD-10-CM | POA: Diagnosis not present

## 2023-01-05 DIAGNOSIS — D708 Other neutropenia: Secondary | ICD-10-CM

## 2023-01-05 DIAGNOSIS — D509 Iron deficiency anemia, unspecified: Secondary | ICD-10-CM

## 2023-01-05 LAB — CBC WITH DIFFERENTIAL (CANCER CENTER ONLY)
Abs Immature Granulocytes: 0 10*3/uL (ref 0.00–0.07)
Basophils Absolute: 0.1 10*3/uL (ref 0.0–0.1)
Basophils Relative: 1 %
Eosinophils Absolute: 0 10*3/uL (ref 0.0–0.5)
Eosinophils Relative: 0 %
HCT: 36.9 % (ref 36.0–46.0)
Hemoglobin: 11.1 g/dL — ABNORMAL LOW (ref 12.0–15.0)
Immature Granulocytes: 0 %
Lymphocytes Relative: 90 %
Lymphs Abs: 9 10*3/uL — ABNORMAL HIGH (ref 0.7–4.0)
MCH: 28.3 pg (ref 26.0–34.0)
MCHC: 30.1 g/dL (ref 30.0–36.0)
MCV: 94.1 fL (ref 80.0–100.0)
Monocytes Absolute: 0.6 10*3/uL (ref 0.1–1.0)
Monocytes Relative: 6 %
Neutro Abs: 0.3 10*3/uL — CL (ref 1.7–7.7)
Neutrophils Relative %: 3 %
Platelet Count: 170 10*3/uL (ref 150–400)
RBC: 3.92 MIL/uL (ref 3.87–5.11)
RDW: 13.9 % (ref 11.5–15.5)
Smear Review: NORMAL
WBC Count: 9.9 10*3/uL (ref 4.0–10.5)
nRBC: 0 % (ref 0.0–0.2)

## 2023-01-05 LAB — CMP (CANCER CENTER ONLY)
ALT: 12 U/L (ref 0–44)
AST: 20 U/L (ref 15–41)
Albumin: 4.1 g/dL (ref 3.5–5.0)
Alkaline Phosphatase: 66 U/L (ref 38–126)
Anion gap: 6 (ref 5–15)
BUN: 23 mg/dL (ref 8–23)
CO2: 29 mmol/L (ref 22–32)
Calcium: 9.9 mg/dL (ref 8.9–10.3)
Chloride: 105 mmol/L (ref 98–111)
Creatinine: 1.19 mg/dL — ABNORMAL HIGH (ref 0.44–1.00)
GFR, Estimated: 45 mL/min — ABNORMAL LOW (ref >60)
Glucose, Bld: 122 mg/dL — ABNORMAL HIGH (ref 70–99)
Potassium: 4.7 mmol/L (ref 3.5–5.1)
Sodium: 140 mmol/L (ref 135–145)
Total Bilirubin: 0.4 mg/dL (ref 0.3–1.2)
Total Protein: 7.8 g/dL (ref 6.5–8.1)

## 2023-01-05 LAB — LACTATE DEHYDROGENASE: LDH: 182 U/L (ref 98–192)

## 2023-01-05 LAB — SAVE SMEAR(SSMR), FOR PROVIDER SLIDE REVIEW

## 2023-01-05 MED ORDER — SARGRAMOSTIM 250 MCG IJ SOLR
250.0000 ug | Freq: Every day | INTRAMUSCULAR | Status: DC
  Administered 2023-01-05: 250 ug via SUBCUTANEOUS
  Filled 2023-01-05: qty 1

## 2023-01-05 NOTE — Patient Instructions (Signed)
Sargramostim Injection What is this medication? SARGRAMOSTIM (sar GRAM oh stim) lowers the risk of infection in people who are receiving chemotherapy. It works by Systems analyst make more white blood cells, which protects your body from infection. It may also be used to help people who have been exposed to high doses of radiation. It can be used to help prepare your body before a stem cell transplant. It can also be used after a stem cell transplant to promote faster recovery. It works by helping your bone marrow make and release stem cells into the blood. This medicine may be used for other purposes; ask your health care provider or pharmacist if you have questions. COMMON BRAND NAME(S): Leukine What should I tell my care team before I take this medication? They need to know if you have any of these conditions: Heart disease Lung or breathing disease, such as asthma or COPD An unusual or allergic reaction to sargramostim, yeast products, benzyl alcohol, other medications, foods, dyes, or preservatives Pregnant or trying to get pregnant Breast-feeding How should I use this medication? This medication is injected under the skin or into a vein. It is usually given by your care team in a hospital or clinic setting. It may also be given at home. If you get this medication at home, you will be taught how to prepare and give it. Use exactly as directed. Take it as directed on the prescription label at the same time every day. Keep taking it unless your care team tells you to stop. It is important that you put your used needles and syringes in a special sharps container. Do not put them in a trash can. If you do not have a sharps container, call your pharmacist or care team to get one. Talk to your care team about the use of this medication in children. While this medication may be prescribed for children as young as 83 years of age for selected conditions, precautions do apply. Overdosage: If you think  you have taken too much of this medicine contact a poison control center or emergency room at once. NOTE: This medicine is only for you. Do not share this medicine with others. What if I miss a dose? If you get this medication at the hospital or clinic: It is important not to miss your dose. Call your care team if you are unable to keep an appointment. If you give yourself this medication at home: If you miss a dose, take it as soon as you can. If it is almost time for your next dose, take only that dose. Do not take double or extra doses. Call your care team with questions. What may interact with this medication? Lithium Steroid medications, such as prednisone or cortisone This list may not describe all possible interactions. Give your health care provider a list of all the medicines, herbs, non-prescription drugs, or dietary supplements you use. Also tell them if you smoke, drink alcohol, or use illegal drugs. Some items may interact with your medicine. What should I watch for while using this medication? Visit your care team for regular checks on your progress. Tell your care team if your symptoms do not start to get better or if they get worse. You may need blood work done while you are taking this medication. Talk to your care team before breast-feeding. Changes to your treatment plan may be needed. What side effects may I notice from receiving this medication? Side effects that you should report to your  care team as soon as possible: Allergic reactions--skin rash, itching, hives, swelling of the face, lips, tongue, or throat Capillary leak syndrome--stomach or muscle pain, unusual weakness or fatigue, feeling faint or lightheaded, decrease in the amount of urine, swelling of the ankles, hands, or feet, trouble breathing Heart rhythm changes--fast or irregular heartbeat, dizziness, feeling faint or lightheaded, chest pain, trouble breathing High white blood cell level--fever, fatigue, trouble  breathing, night sweats, change in vision, weight loss Infusion reactions--chest pain, shortness of breath or trouble breathing, feeling faint or lightheaded Side effects that usually do not require medical attention (report to your care team if they continue or are bothersome): Bone pain Diarrhea Nausea Skin rash Stomach pain Vomiting Weight loss This list may not describe all possible side effects. Call your doctor for medical advice about side effects. You may report side effects to FDA at 1-800-FDA-1088. Where should I keep my medication? Keep out of the reach of children and pets. Store in the refrigerator. Do not freeze. Throw away any unused medication after 20 days. To get rid of medications that are no longer wanted or have expired: Take the medication to a medication take-back program. Check with your pharmacy or law enforcement to find a location. If you cannot return the medication, ask your pharmacist or care team how to get rid of this medication safely. NOTE: This sheet is a summary. It may not cover all possible information. If you have questions about this medicine, talk to your doctor, pharmacist, or health care provider.  2024 Elsevier/Gold Standard (2021-12-12 00:00:00)

## 2023-01-05 NOTE — Progress Notes (Signed)
Critical result received from lab of ANC 0.3 [name] Stanford NP aware. No new orders received. Neutropenic precautions reviewed with patient who verbalized understanding and had no further questions.

## 2023-01-06 ENCOUNTER — Inpatient Hospital Stay: Payer: Medicare HMO

## 2023-01-06 VITALS — BP 121/56 | HR 96 | Temp 97.9°F | Resp 18

## 2023-01-06 DIAGNOSIS — C91Z0 Other lymphoid leukemia not having achieved remission: Secondary | ICD-10-CM | POA: Diagnosis not present

## 2023-01-06 DIAGNOSIS — D61818 Other pancytopenia: Secondary | ICD-10-CM | POA: Diagnosis not present

## 2023-01-06 DIAGNOSIS — D509 Iron deficiency anemia, unspecified: Secondary | ICD-10-CM

## 2023-01-06 DIAGNOSIS — M069 Rheumatoid arthritis, unspecified: Secondary | ICD-10-CM | POA: Diagnosis not present

## 2023-01-06 MED ORDER — SARGRAMOSTIM 250 MCG IJ SOLR
250.0000 ug | Freq: Every day | INTRAMUSCULAR | Status: DC
  Administered 2023-01-06: 250 ug via SUBCUTANEOUS
  Filled 2023-01-06: qty 1

## 2023-01-06 NOTE — Patient Instructions (Signed)
Sargramostim Injection What is this medication? SARGRAMOSTIM (sar GRAM oh stim) lowers the risk of infection in people who are receiving chemotherapy. It works by helping your body make more white blood cells, which protects your body from infection. It may also be used to help people who have been exposed to high doses of radiation. It can be used to help prepare your body before a stem cell transplant. It can also be used after a stem cell transplant to promote faster recovery. It works by helping your bone marrow make and release stem cells into the blood. This medicine may be used for other purposes; ask your health care provider or pharmacist if you have questions. COMMON BRAND NAME(S): Leukine What should I tell my care team before I take this medication? They need to know if you have any of these conditions: Heart disease Lung or breathing disease, such as asthma or COPD An unusual or allergic reaction to sargramostim, yeast products, benzyl alcohol, other medications, foods, dyes, or preservatives Pregnant or trying to get pregnant Breast-feeding How should I use this medication? This medication is injected under the skin or into a vein. It is usually given by your care team in a hospital or clinic setting. It may also be given at home. If you get this medication at home, you will be taught how to prepare and give it. Use exactly as directed. Take it as directed on the prescription label at the same time every day. Keep taking it unless your care team tells you to stop. It is important that you put your used needles and syringes in a special sharps container. Do not put them in a trash can. If you do not have a sharps container, call your pharmacist or care team to get one. Talk to your care team about the use of this medication in children. While this medication may be prescribed for children as young as 2 years of age for selected conditions, precautions do apply. Overdosage: If you think  you have taken too much of this medicine contact a poison control center or emergency room at once. NOTE: This medicine is only for you. Do not share this medicine with others. What if I miss a dose? If you get this medication at the hospital or clinic: It is important not to miss your dose. Call your care team if you are unable to keep an appointment. If you give yourself this medication at home: If you miss a dose, take it as soon as you can. If it is almost time for your next dose, take only that dose. Do not take double or extra doses. Call your care team with questions. What may interact with this medication? Lithium Steroid medications, such as prednisone or cortisone This list may not describe all possible interactions. Give your health care provider a list of all the medicines, herbs, non-prescription drugs, or dietary supplements you use. Also tell them if you smoke, drink alcohol, or use illegal drugs. Some items may interact with your medicine. What should I watch for while using this medication? Visit your care team for regular checks on your progress. Tell your care team if your symptoms do not start to get better or if they get worse. You may need blood work done while you are taking this medication. Talk to your care team before breast-feeding. Changes to your treatment plan may be needed. What side effects may I notice from receiving this medication? Side effects that you should report to your   care team as soon as possible: Allergic reactions--skin rash, itching, hives, swelling of the face, lips, tongue, or throat Capillary leak syndrome--stomach or muscle pain, unusual weakness or fatigue, feeling faint or lightheaded, decrease in the amount of urine, swelling of the ankles, hands, or feet, trouble breathing Heart rhythm changes--fast or irregular heartbeat, dizziness, feeling faint or lightheaded, chest pain, trouble breathing High white blood cell level--fever, fatigue, trouble  breathing, night sweats, change in vision, weight loss Infusion reactions--chest pain, shortness of breath or trouble breathing, feeling faint or lightheaded Side effects that usually do not require medical attention (report to your care team if they continue or are bothersome): Bone pain Diarrhea Nausea Skin rash Stomach pain Vomiting Weight loss This list may not describe all possible side effects. Call your doctor for medical advice about side effects. You may report side effects to FDA at 1-800-FDA-1088. Where should I keep my medication? Keep out of the reach of children and pets. Store in the refrigerator. Do not freeze. Throw away any unused medication after 20 days. To get rid of medications that are no longer wanted or have expired: Take the medication to a medication take-back program. Check with your pharmacy or law enforcement to find a location. If you cannot return the medication, ask your pharmacist or care team how to get rid of this medication safely. NOTE: This sheet is a summary. It may not cover all possible information. If you have questions about this medicine, talk to your doctor, pharmacist, or health care provider.  2024 Elsevier/Gold Standard (2021-12-12 00:00:00)  

## 2023-01-07 ENCOUNTER — Ambulatory Visit: Payer: Medicare HMO | Admitting: Dietician

## 2023-01-07 ENCOUNTER — Telehealth: Payer: Self-pay | Admitting: Dietician

## 2023-01-07 ENCOUNTER — Inpatient Hospital Stay: Payer: Medicare HMO

## 2023-01-07 VITALS — BP 141/50 | HR 95 | Temp 97.9°F | Resp 19

## 2023-01-07 DIAGNOSIS — M069 Rheumatoid arthritis, unspecified: Secondary | ICD-10-CM | POA: Diagnosis not present

## 2023-01-07 DIAGNOSIS — D61818 Other pancytopenia: Secondary | ICD-10-CM | POA: Diagnosis not present

## 2023-01-07 DIAGNOSIS — D509 Iron deficiency anemia, unspecified: Secondary | ICD-10-CM

## 2023-01-07 DIAGNOSIS — C91Z0 Other lymphoid leukemia not having achieved remission: Secondary | ICD-10-CM | POA: Diagnosis not present

## 2023-01-07 MED ORDER — SARGRAMOSTIM 250 MCG IJ SOLR
250.0000 ug | Freq: Every day | INTRAMUSCULAR | Status: DC
  Administered 2023-01-07: 250 ug via SUBCUTANEOUS
  Filled 2023-01-07: qty 1

## 2023-01-07 NOTE — Telephone Encounter (Signed)
Attempted to reach patient's son [name] [name] for a scheduled remote nutrition consult. Provided my cell# on voice mail to return call for a follow up nutrition consult.  Gennaro Africa, RDN, LDN Registered Dietitian, [facility] Part Time Remote (Usual office hours: Tuesday-Thursday) Cell: [phone]

## 2023-01-07 NOTE — Progress Notes (Signed)
Nutrition Follow Up:   Patient's son Tawanna Cooler returned call. He reports appetite is much better.  Monia Pouch has sent her some fresh food meals that she's enjoying. Unfortunately he doesn't know how long they will continue.  He said a representative from [place] called and said she qualified for her meal delivery program.  She is only drinking her ensure when she doesn't feel like eating but she has plenty of chocolate and strawberry in her home. Her brother is also with her more frequently.    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 Improving with weight gain and improved appetite.    GOAL:   Weight maintenance  MONITOR:    PO intake, Labs, Weight trends,   Intervention:  Encouraged continued family visits especially at mealtimes.  Offered resources, declined need at this time.     Next Visit: PRN at provider or son's request   Gennaro Africa, RDN, LDN Registered Dietitian, [facility] Part Time Remote (Usual office hours: Tuesday-Thursday) Mobile: [phone]   Mobile: [phone] Remote Office: [phone]

## 2023-01-10 ENCOUNTER — Other Ambulatory Visit: Payer: Self-pay | Admitting: Family Medicine

## 2023-01-10 DIAGNOSIS — R7303 Prediabetes: Secondary | ICD-10-CM

## 2023-01-10 DIAGNOSIS — N1832 Chronic kidney disease, stage 3b: Secondary | ICD-10-CM

## 2023-01-12 ENCOUNTER — Encounter: Payer: Self-pay | Admitting: Hematology & Oncology

## 2023-01-12 ENCOUNTER — Inpatient Hospital Stay: Payer: Medicare HMO

## 2023-01-12 ENCOUNTER — Inpatient Hospital Stay: Payer: Medicare HMO | Admitting: Hematology & Oncology

## 2023-01-12 VITALS — BP 139/47 | HR 87 | Temp 98.4°F | Resp 20 | Wt 113.4 lb

## 2023-01-12 DIAGNOSIS — C91Z0 Other lymphoid leukemia not having achieved remission: Secondary | ICD-10-CM

## 2023-01-12 DIAGNOSIS — D61818 Other pancytopenia: Secondary | ICD-10-CM | POA: Diagnosis not present

## 2023-01-12 DIAGNOSIS — M069 Rheumatoid arthritis, unspecified: Secondary | ICD-10-CM | POA: Diagnosis not present

## 2023-01-12 DIAGNOSIS — D708 Other neutropenia: Secondary | ICD-10-CM

## 2023-01-12 LAB — CMP (CANCER CENTER ONLY)
ALT: 7 U/L (ref 0–44)
AST: 13 U/L — ABNORMAL LOW (ref 15–41)
Albumin: 3.9 g/dL (ref 3.5–5.0)
Alkaline Phosphatase: 67 U/L (ref 38–126)
Anion gap: 8 (ref 5–15)
BUN: 20 mg/dL (ref 8–23)
CO2: 30 mmol/L (ref 22–32)
Calcium: 9.6 mg/dL (ref 8.9–10.3)
Chloride: 105 mmol/L (ref 98–111)
Creatinine: 1.13 mg/dL — ABNORMAL HIGH (ref 0.44–1.00)
GFR, Estimated: 47 mL/min — ABNORMAL LOW (ref >60)
Glucose, Bld: 110 mg/dL — ABNORMAL HIGH (ref 70–99)
Potassium: 4.2 mmol/L (ref 3.5–5.1)
Sodium: 143 mmol/L (ref 135–145)
Total Bilirubin: 0.4 mg/dL (ref 0.3–1.2)
Total Protein: 7.3 g/dL (ref 6.5–8.1)

## 2023-01-12 LAB — CBC WITH DIFFERENTIAL (CANCER CENTER ONLY)
Abs Immature Granulocytes: 0 10*3/uL (ref 0.00–0.07)
Basophils Absolute: 0.1 10*3/uL (ref 0.0–0.1)
Basophils Relative: 1 %
Eosinophils Absolute: 0 10*3/uL (ref 0.0–0.5)
Eosinophils Relative: 0 %
HCT: 37 % (ref 36.0–46.0)
Hemoglobin: 11.3 g/dL — ABNORMAL LOW (ref 12.0–15.0)
Immature Granulocytes: 0 %
Lymphocytes Relative: 88 %
Lymphs Abs: 8.8 10*3/uL — ABNORMAL HIGH (ref 0.7–4.0)
MCH: 28.4 pg (ref 26.0–34.0)
MCHC: 30.5 g/dL (ref 30.0–36.0)
MCV: 93 fL (ref 80.0–100.0)
Monocytes Absolute: 0.8 10*3/uL (ref 0.1–1.0)
Monocytes Relative: 8 %
Neutro Abs: 0.3 10*3/uL — CL (ref 1.7–7.7)
Neutrophils Relative %: 3 %
Platelet Count: 166 10*3/uL (ref 150–400)
RBC: 3.98 MIL/uL (ref 3.87–5.11)
RDW: 13.4 % (ref 11.5–15.5)
Smear Review: NORMAL
WBC Count: 10.2 10*3/uL (ref 4.0–10.5)
nRBC: 0 % (ref 0.0–0.2)

## 2023-01-12 LAB — LACTATE DEHYDROGENASE: LDH: 165 U/L (ref 98–192)

## 2023-01-12 LAB — SAVE SMEAR(SSMR), FOR PROVIDER SLIDE REVIEW

## 2023-01-12 NOTE — Progress Notes (Signed)
Hematology and Oncology Follow Up Visit  [name] [name] [id number] September 30, 1934 88 y.o. 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 weekly- start on 07/27/2021 - stopped 11/07/2022 d/y pancytopenia    Interim History:  [name] [name] is here today with her son for follow-up.  She is doing okay.  She feels pretty good.  She seems to be eating okay.  She did receive Leukine 3 days last week.  Unfortunately, her white blood cell count still is not much better.  Her white cell count is 10.2.  Her ANC is 300.  I did state the fact that she still has profound neutropenia.  She has had no fever.  She has had no rashes.  There has been no bleeding.  She has had no cough or shortness of breath.  She has had no mouth sores.  Overall, I would have said that her performance status is probably ECOG 2.    Medications:  Allergies as of 01/12/2023       Reactions   Tramadol Nausea Only   Codeine Phosphate Nausea Only        Medication List        Accurate as of January 12, 2023  1:49 PM. If you have any questions, ask your nurse or doctor.          acetaminophen 325 MG tablet Commonly known as: TYLENOL Take 650 mg by mouth as needed for headache.   benazepril 5 MG tablet Commonly known as: LOTENSIN Take 1 tablet daily.   Jardiance 10 MG Tabs tablet Generic drug: empagliflozin TAKE 1 TABLET BY MOUTH DAILY BEFORE BREAKFAST.   leflunomide 10 MG tablet Commonly known as: ARAVA Take 10 mg by mouth daily.   levothyroxine 88 MCG tablet Commonly known as: SYNTHROID TAKE 1 TABLET BY MOUTH DAILY BEFORE BREAKFAST.   senna-docusate 8.6-50 MG tablet Commonly known as: Senokot-S Take 1 tablet by mouth 2 (two) times daily.        Allergies:  Allergies  Allergen Reactions   Tramadol Nausea Only   Codeine Phosphate Nausea Only    Past Medical History, Surgical history, Social history,  and Family History were reviewed and updated.  Review of Systems: Review of Systems  Constitutional: Negative.   HENT: Negative.    Eyes: Negative.   Respiratory: Negative.    Cardiovascular: Negative.   Gastrointestinal: Negative.   Genitourinary: Negative.   Musculoskeletal: Negative.   Skin: Negative.   Neurological: Negative.   Endo/Heme/Allergies: Negative.   Psychiatric/Behavioral: Negative.       Physical Exam:  weight is 113 lb 6.4 oz (51.4 kg). Her oral temperature is 98.4 F (36.9 C). Her blood pressure is 139/47 (abnormal) and her pulse is 87. Her respiration is 20 and oxygen saturation is 100%.   Wt Readings from Last 3 Encounters:  01/12/23 113 lb 6.4 oz (51.4 kg)  12/31/22 113 lb 12.8 oz (51.6 kg)  12/30/22 113 lb 12.8 oz (51.6 kg)   Physical Exam Vitals reviewed.  HENT:     Head: Normocephalic and atraumatic.  Eyes:     Pupils: Pupils are equal, round, and reactive to light.  Cardiovascular:     Rate and Rhythm: Normal rate and regular rhythm.     Heart sounds: Normal heart sounds.  Pulmonary:     Effort: Pulmonary effort is normal.     Breath sounds: Normal breath sounds.  Abdominal:     General: Bowel sounds are normal.     Palpations: Abdomen is soft.  Musculoskeletal:        General: No tenderness or deformity. Normal range of motion.     Cervical back: Normal range of motion.  Lymphadenopathy:     Cervical: No cervical adenopathy.  Skin:    General: Skin is warm and dry.     Findings: No erythema or rash.  Neurological:     Mental Status: She is alert and oriented to person, place, and time.  Psychiatric:        Behavior: Behavior normal.        Thought Content: Thought content normal.        Judgment: Judgment normal.     Lab Results  Component Value Date   WBC 10.2 01/12/2023   HGB 11.3 (L) 01/12/2023   HCT 37.0 01/12/2023   MCV 93.0 01/12/2023   PLT 166 01/12/2023   Lab Results  Component Value Date   FERRITIN 266 09/25/2022    IRON 28 09/25/2022   TIBC 193 (L) 09/25/2022   UIBC 165 09/25/2022   IRONPCTSAT 15 09/25/2022   Lab Results  Component Value Date   RETICCTPCT 1.6 09/25/2022   RBC 3.98 01/12/2023   No results found for: "KPAFRELGTCHN", "LAMBDASER", "KAPLAMBRATIO" Lab Results  Component Value Date   IGGSERUM 1,266 07/15/2021   IGA 424 (H) 07/15/2021   IGMSERUM 194 07/15/2021   No results found for: "TOTALPROTELP", "ALBUMINELP", "A1GS", "A2GS", "BETS", "BETA2SER", "GAMS", "MSPIKE", "SPEI"   Chemistry      Component Value Date/Time   NA 140 01/05/2023 1345   NA 141 02/03/2019 1129   K 4.7 01/05/2023 1345   CL 105 01/05/2023 1345   CO2 29 01/05/2023 1345   BUN 23 01/05/2023 1345   BUN 31 (H) 02/03/2019 1129   CREATININE 1.19 (H) 01/05/2023 1345   CREATININE 1.00 (H) 05/20/2021 1545      Component Value Date/Time   CALCIUM 9.9 01/05/2023 1345   ALKPHOS 66 01/05/2023 1345   AST 20 01/05/2023 1345   ALT 12 01/05/2023 1345   BILITOT 0.4 01/05/2023 1345       Impression and Plan: [name] [name] is a very pleasant 88 yo caucasian female with history of both Large granular lymphocytic leukemia and RA.   Again, she has a profound leukopenia.  She responded very well when she was in the hospital with Leukine.  She is asymptomatic right now.  I think for right now, we just have to follow her weekly with lab work.  I think this might be the best way for [place] to evaluate her.  I am unsure if we need to increase the dose of Leukine we will give this to her.  This is incredibly challenging.  I realize that she is mature.  She is doing quite well.  She is not symptomatic with the neutropenia.  However, I do worry that if she does become febrile, she will not have much of a immune system to fight any potential infection.  I would like to see her back in about 3 or 4 weeks.  [name] Macho, MD 6/10/20241:49 PM

## 2023-02-12 LAB — HM DIABETES EYE EXAM

## 2023-02-17 ENCOUNTER — Encounter: Payer: Self-pay | Admitting: Family Medicine

## 2023-02-26 ENCOUNTER — Inpatient Hospital Stay (HOSPITAL_BASED_OUTPATIENT_CLINIC_OR_DEPARTMENT_OTHER): Payer: Medicare HMO | Admitting: Hematology & Oncology

## 2023-02-26 ENCOUNTER — Encounter: Payer: Self-pay | Admitting: Hematology & Oncology

## 2023-02-26 ENCOUNTER — Other Ambulatory Visit: Payer: Self-pay

## 2023-02-26 ENCOUNTER — Inpatient Hospital Stay: Payer: Medicare HMO | Attending: Hematology & Oncology

## 2023-02-26 ENCOUNTER — Inpatient Hospital Stay: Payer: Medicare HMO

## 2023-02-26 VITALS — BP 123/47 | HR 80 | Temp 97.9°F | Resp 18 | Ht 61.5 in | Wt 112.0 lb

## 2023-02-26 DIAGNOSIS — D61818 Other pancytopenia: Secondary | ICD-10-CM | POA: Diagnosis not present

## 2023-02-26 DIAGNOSIS — Z5189 Encounter for other specified aftercare: Secondary | ICD-10-CM | POA: Insufficient documentation

## 2023-02-26 DIAGNOSIS — D708 Other neutropenia: Secondary | ICD-10-CM | POA: Diagnosis not present

## 2023-02-26 DIAGNOSIS — C91Z0 Other lymphoid leukemia not having achieved remission: Secondary | ICD-10-CM | POA: Diagnosis not present

## 2023-02-26 DIAGNOSIS — D509 Iron deficiency anemia, unspecified: Secondary | ICD-10-CM

## 2023-02-26 LAB — CMP (CANCER CENTER ONLY)
ALT: 19 U/L (ref 0–44)
AST: 25 U/L (ref 15–41)
Albumin: 3.9 g/dL (ref 3.5–5.0)
Alkaline Phosphatase: 76 U/L (ref 38–126)
Anion gap: 9 (ref 5–15)
BUN: 33 mg/dL — ABNORMAL HIGH (ref 8–23)
CO2: 28 mmol/L (ref 22–32)
Calcium: 9.9 mg/dL (ref 8.9–10.3)
Chloride: 104 mmol/L (ref 98–111)
Creatinine: 0.96 mg/dL (ref 0.44–1.00)
GFR, Estimated: 58 mL/min — ABNORMAL LOW (ref >60)
Glucose, Bld: 122 mg/dL — ABNORMAL HIGH (ref 70–99)
Potassium: 4.2 mmol/L (ref 3.5–5.1)
Sodium: 141 mmol/L (ref 135–145)
Total Bilirubin: 0.3 mg/dL (ref 0.3–1.2)
Total Protein: 7.6 g/dL (ref 6.5–8.1)

## 2023-02-26 LAB — CBC WITH DIFFERENTIAL (CANCER CENTER ONLY)
Abs Immature Granulocytes: 0.01 10*3/uL (ref 0.00–0.07)
Basophils Absolute: 0.1 10*3/uL (ref 0.0–0.1)
Basophils Relative: 0 %
Eosinophils Absolute: 0 10*3/uL (ref 0.0–0.5)
Eosinophils Relative: 0 %
HCT: 39.1 % (ref 36.0–46.0)
Hemoglobin: 11.8 g/dL — ABNORMAL LOW (ref 12.0–15.0)
Immature Granulocytes: 0 %
Lymphocytes Relative: 94 %
Lymphs Abs: 13.5 10*3/uL — ABNORMAL HIGH (ref 0.7–4.0)
MCH: 26.3 pg (ref 26.0–34.0)
MCHC: 30.2 g/dL (ref 30.0–36.0)
MCV: 87.1 fL (ref 80.0–100.0)
Monocytes Absolute: 0.7 10*3/uL (ref 0.1–1.0)
Monocytes Relative: 5 %
Neutro Abs: 0.2 10*3/uL — CL (ref 1.7–7.7)
Neutrophils Relative %: 1 %
Platelet Count: 147 10*3/uL — ABNORMAL LOW (ref 150–400)
RBC: 4.49 MIL/uL (ref 3.87–5.11)
RDW: 13.6 % (ref 11.5–15.5)
Smear Review: NORMAL
WBC Count: 14.5 10*3/uL — ABNORMAL HIGH (ref 4.0–10.5)
nRBC: 0 % (ref 0.0–0.2)

## 2023-02-26 MED ORDER — PEGFILGRASTIM-JMDB 6 MG/0.6ML ~~LOC~~ SOSY
6.0000 mg | PREFILLED_SYRINGE | Freq: Once | SUBCUTANEOUS | Status: AC
  Administered 2023-02-26: 6 mg via SUBCUTANEOUS
  Filled 2023-02-26: qty 0.6

## 2023-02-26 NOTE — Patient Instructions (Signed)

## 2023-02-26 NOTE — Progress Notes (Signed)
Hematology and Oncology Follow Up Visit  EDELL MESENBRINK [id number] [birth year] 88 y.o. 02/26/2023   Principle Diagnosis:  Large granular lymphocytic leukemia Rheumatoid arthritis   Current Therapy:        Methotrexate 20 mg p.o. weekly- start on 07/27/2021 - stopped 11/07/2022 d/y pancytopenia  GM-CSF/G-CSF q 2-3 week   Interim History:  Ms. Duffee is here today with her son for follow-up.  Is been quite a while since we last saw her.  Will get have to make sure that we get her in lab work often so that we can try to help with her white cell count.  She seems to be managing.  Unfortunate, she just is not eating much.  She lives by herself.  Whenever she goes out with family or friends, she does seem to eat better.  Unfortunately, we are trying to give her GM-CSF.  I am not sure why we cannot do this in the office.  I will have to work on this.  She has had no problems with infections.  She has had no problems with cough or shortness of breath.  She has had no nausea or vomiting.  There has been no change in bowel or bladder habits.  She has had no leg swelling.  She has had no bleeding.  Overall, her performance status is ECOG 2.   As Medications:  Allergies as of 02/26/2023       Reactions   Tramadol Nausea Only   Codeine Phosphate Nausea Only        Medication List        Accurate as of February 26, 2023  3:00 PM. If you have any questions, ask your nurse or doctor.          acetaminophen 325 MG tablet Commonly known as: TYLENOL Take 650 mg by mouth as needed for headache.   benazepril 5 MG tablet Commonly known as: LOTENSIN Take 1 tablet daily.   Jardiance 10 MG Tabs tablet Generic drug: empagliflozin TAKE 1 TABLET BY MOUTH DAILY BEFORE BREAKFAST.   leflunomide 10 MG tablet Commonly known as: ARAVA Take 10 mg by mouth daily.   levothyroxine 88 MCG tablet Commonly known as: SYNTHROID TAKE 1 TABLET BY MOUTH DAILY BEFORE BREAKFAST.   senna-docusate 8.6-50  MG tablet Commonly known as: Senokot-S Take 1 tablet by mouth 2 (two) times daily.        Allergies:  Allergies  Allergen Reactions   Tramadol Nausea Only   Codeine Phosphate Nausea Only    Past Medical History, Surgical history, Social history, and Family History were reviewed and updated.  Review of Systems: Review of Systems  Constitutional: Negative.   HENT: Negative.    Eyes: Negative.   Respiratory: Negative.    Cardiovascular: Negative.   Gastrointestinal: Negative.   Genitourinary: Negative.   Musculoskeletal: Negative.   Skin: Negative.   Neurological: Negative.   Endo/Heme/Allergies: Negative.   Psychiatric/Behavioral: Negative.       Physical Exam:  height is 5' 1.5" (1.562 m) and weight is 112 lb (50.8 kg). Her oral temperature is 97.9 F (36.6 C). Her blood pressure is 123/47 (abnormal) and her pulse is 80. Her respiration is 18 and oxygen saturation is 100%.   Wt Readings from Last 3 Encounters:  02/26/23 112 lb (50.8 kg)  01/12/23 113 lb 6.4 oz (51.4 kg)  12/31/22 113 lb 12.8 oz (51.6 kg)   Physical Exam Vitals reviewed.  HENT:     Head: Normocephalic and atraumatic.  Eyes:     Pupils: Pupils are equal, round, and reactive to light.  Cardiovascular:     Rate and Rhythm: Normal rate and regular rhythm.     Heart sounds: Normal heart sounds.  Pulmonary:     Effort: Pulmonary effort is normal.     Breath sounds: Normal breath sounds.  Abdominal:     General: Bowel sounds are normal.     Palpations: Abdomen is soft.  Musculoskeletal:        General: No tenderness or deformity. Normal range of motion.     Cervical back: Normal range of motion.  Lymphadenopathy:     Cervical: No cervical adenopathy.  Skin:    General: Skin is warm and dry.     Findings: No erythema or rash.  Neurological:     Mental Status: She is alert and oriented to person, place, and time.  Psychiatric:        Behavior: Behavior normal.        Thought Content: Thought  content normal.        Judgment: Judgment normal.    Lab Results  Component Value Date   WBC 14.5 (H) 02/26/2023   HGB 11.8 (L) 02/26/2023   HCT 39.1 02/26/2023   MCV 87.1 02/26/2023   PLT 147 (L) 02/26/2023   Lab Results  Component Value Date   FERRITIN 266 09/25/2022   IRON 28 09/25/2022   TIBC 193 (L) 09/25/2022   UIBC 165 09/25/2022   IRONPCTSAT 15 09/25/2022   Lab Results  Component Value Date   RETICCTPCT 1.6 09/25/2022   RBC 4.49 02/26/2023   No results found for: "KPAFRELGTCHN", "LAMBDASER", "KAPLAMBRATIO" Lab Results  Component Value Date   IGGSERUM 1,266 07/15/2021   IGA 424 (H) 07/15/2021   IGMSERUM 194 07/15/2021   No results found for: "TOTALPROTELP", "ALBUMINELP", "A1GS", "A2GS", "BETS", "BETA2SER", "GAMS", "MSPIKE", "SPEI"   Chemistry      Component Value Date/Time   NA 143 01/12/2023 1321   NA 141 02/03/2019 1129   K 4.2 01/12/2023 1321   CL 105 01/12/2023 1321   CO2 30 01/12/2023 1321   BUN 20 01/12/2023 1321   BUN 31 (H) 02/03/2019 1129   CREATININE 1.13 (H) 01/12/2023 1321   CREATININE 1.00 (H) 05/20/2021 1545      Component Value Date/Time   CALCIUM 9.6 01/12/2023 1321   ALKPHOS 67 01/12/2023 1321   AST 13 (L) 01/12/2023 1321   ALT 7 01/12/2023 1321   BILITOT 0.4 01/12/2023 1321       Impression and Plan: Ms. 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   Again, she has a profound leukopenia.  She responded very well when she was in the hospital with Leukine.  I will try her on G-CSF.  Again, we need to try to get her the GM-CSF if we can.  I forgot to mention that I sister passed on.  Now she and her brother are the only ones that are left in her family.  I know this is difficult for her.  I would like to try to get her back here in about 2 weeks or so.   Josph Macho, MD 7/25/20243:00 PM

## 2023-02-26 NOTE — Progress Notes (Signed)
Critical result received from lab of ANC 0.2 Dr. Marin Olp aware and no new orders received.

## 2023-03-12 ENCOUNTER — Other Ambulatory Visit: Payer: Self-pay | Admitting: Hematology & Oncology

## 2023-03-12 DIAGNOSIS — Z8639 Personal history of other endocrine, nutritional and metabolic disease: Secondary | ICD-10-CM

## 2023-03-16 ENCOUNTER — Encounter: Payer: Self-pay | Admitting: Hematology & Oncology

## 2023-03-16 ENCOUNTER — Inpatient Hospital Stay: Payer: Medicare HMO | Attending: Hematology & Oncology

## 2023-03-16 ENCOUNTER — Inpatient Hospital Stay: Payer: Medicare HMO | Admitting: Hematology & Oncology

## 2023-03-16 ENCOUNTER — Inpatient Hospital Stay: Payer: Medicare HMO

## 2023-03-16 ENCOUNTER — Other Ambulatory Visit: Payer: Self-pay

## 2023-03-16 VITALS — BP 145/45 | HR 85 | Temp 98.1°F | Resp 18 | Ht 61.5 in | Wt 111.0 lb

## 2023-03-16 DIAGNOSIS — C91Z0 Other lymphoid leukemia not having achieved remission: Secondary | ICD-10-CM | POA: Insufficient documentation

## 2023-03-16 DIAGNOSIS — D708 Other neutropenia: Secondary | ICD-10-CM

## 2023-03-16 DIAGNOSIS — M069 Rheumatoid arthritis, unspecified: Secondary | ICD-10-CM | POA: Insufficient documentation

## 2023-03-16 DIAGNOSIS — Z79899 Other long term (current) drug therapy: Secondary | ICD-10-CM | POA: Insufficient documentation

## 2023-03-16 LAB — CMP (CANCER CENTER ONLY)
ALT: 12 U/L (ref 0–44)
AST: 21 U/L (ref 15–41)
Albumin: 3.6 g/dL (ref 3.5–5.0)
Alkaline Phosphatase: 65 U/L (ref 38–126)
Anion gap: 8 (ref 5–15)
BUN: 33 mg/dL — ABNORMAL HIGH (ref 8–23)
CO2: 23 mmol/L (ref 22–32)
Calcium: 9.2 mg/dL (ref 8.9–10.3)
Chloride: 106 mmol/L (ref 98–111)
Creatinine: 0.99 mg/dL (ref 0.44–1.00)
GFR, Estimated: 56 mL/min — ABNORMAL LOW (ref >60)
Glucose, Bld: 99 mg/dL (ref 70–99)
Potassium: 4.2 mmol/L (ref 3.5–5.1)
Sodium: 137 mmol/L (ref 135–145)
Total Bilirubin: 0.4 mg/dL (ref 0.3–1.2)
Total Protein: 7.4 g/dL (ref 6.5–8.1)

## 2023-03-16 LAB — CBC WITH DIFFERENTIAL (CANCER CENTER ONLY)
Abs Immature Granulocytes: 0.01 10*3/uL (ref 0.00–0.07)
Basophils Absolute: 0.1 10*3/uL (ref 0.0–0.1)
Basophils Relative: 1 %
Eosinophils Absolute: 0 10*3/uL (ref 0.0–0.5)
Eosinophils Relative: 0 %
HCT: 36.5 % (ref 36.0–46.0)
Hemoglobin: 11.3 g/dL — ABNORMAL LOW (ref 12.0–15.0)
Immature Granulocytes: 0 %
Lymphocytes Relative: 83 %
Lymphs Abs: 11.9 10*3/uL — ABNORMAL HIGH (ref 0.7–4.0)
MCH: 25.2 pg — ABNORMAL LOW (ref 26.0–34.0)
MCHC: 31 g/dL (ref 30.0–36.0)
MCV: 81.5 fL (ref 80.0–100.0)
Monocytes Absolute: 0.8 10*3/uL (ref 0.1–1.0)
Monocytes Relative: 6 %
Neutro Abs: 1.3 10*3/uL — ABNORMAL LOW (ref 1.7–7.7)
Neutrophils Relative %: 10 %
Platelet Count: 178 10*3/uL (ref 150–400)
RBC: 4.48 MIL/uL (ref 3.87–5.11)
RDW: 15.2 % (ref 11.5–15.5)
Smear Review: NORMAL
WBC Count: 14.2 10*3/uL — ABNORMAL HIGH (ref 4.0–10.5)
nRBC: 0 % (ref 0.0–0.2)

## 2023-03-16 LAB — SAVE SMEAR(SSMR), FOR PROVIDER SLIDE REVIEW

## 2023-03-16 LAB — PREALBUMIN: Prealbumin: 20 mg/dL (ref 18–38)

## 2023-03-16 NOTE — Progress Notes (Signed)
Hematology and Oncology Follow Up Visit  BRAYTON BROXSON [id number] 01/24/1935 88 y.o. 03/16/2023   Principle Diagnosis:  Large granular lymphocytic leukemia Rheumatoid arthritis   Current Therapy:        Methotrexate 20 mg p.o. weekly- start on 07/27/2021 - stopped 11/07/2022 d/y pancytopenia  GM-CSF/G-CSF q 2-3 week   Interim History:  Ms. Cesaro is here today with her son for follow-up.  As always, she is doing pretty well.  She really has had no complaints.  She is not eating all that much.  Again she has not eating much for quite a while.  She has had no issues with nausea or vomiting.  She has had no problems with fever.  There has been no change in bowel or bladder habits.  She has had no cough.  We do give her G-CSF.  I am not sure how much this really is helping [place] out in all honesty.  I know her white cell count goes up but then her neutrophils disappear.  There has been no bleeding.  She has had no rashes.  Overall, I would say that her performance status is probably ECOG 2. .     Medications:  Allergies as of 03/16/2023       Reactions   Tramadol Nausea Only   Codeine Phosphate Nausea Only        Medication List        Accurate as of March 16, 2023  2:08 PM. If you have any questions, ask your nurse or doctor.          acetaminophen 325 MG tablet Commonly known as: TYLENOL Take 650 mg by mouth as needed for headache.   benazepril 5 MG tablet Commonly known as: LOTENSIN Take 1 tablet daily.   Jardiance 10 MG Tabs tablet Generic drug: empagliflozin TAKE 1 TABLET BY MOUTH DAILY BEFORE BREAKFAST.   leflunomide 10 MG tablet Commonly known as: ARAVA Take 10 mg by mouth daily.   levothyroxine 88 MCG tablet Commonly known as: SYNTHROID TAKE 1 TABLET BY MOUTH EVERY DAY BEFORE BREAKFAST   senna-docusate 8.6-50 MG tablet Commonly known as: Senokot-S Take 1 tablet by mouth 2 (two) times daily.        Allergies:  Allergies  Allergen Reactions    Tramadol Nausea Only   Codeine Phosphate Nausea Only    Past Medical History, Surgical history, Social history, and Family History were reviewed and updated.  Review of Systems: Review of Systems  Constitutional: Negative.   HENT: Negative.    Eyes: Negative.   Respiratory: Negative.    Cardiovascular: Negative.   Gastrointestinal: Negative.   Genitourinary: Negative.   Musculoskeletal: Negative.   Skin: Negative.   Neurological: Negative.   Endo/Heme/Allergies: Negative.   Psychiatric/Behavioral: Negative.       Physical Exam:  height is 5' 1.5" (1.562 m) and weight is 111 lb (50.3 kg). Her oral temperature is 98.1 F (36.7 C). Her blood pressure is 145/45 (abnormal) and her pulse is 85. Her respiration is 18 and oxygen saturation is 98%.   Wt Readings from Last 3 Encounters:  03/16/23 111 lb (50.3 kg)  02/26/23 112 lb (50.8 kg)  01/12/23 113 lb 6.4 oz (51.4 kg)   Physical Exam Vitals reviewed.  HENT:     Head: Normocephalic and atraumatic.  Eyes:     Pupils: Pupils are equal, round, and reactive to light.  Cardiovascular:     Rate and Rhythm: Normal rate and regular rhythm.  Heart sounds: Normal heart sounds.  Pulmonary:     Effort: Pulmonary effort is normal.     Breath sounds: Normal breath sounds.  Abdominal:     General: Bowel sounds are normal.     Palpations: Abdomen is soft.  Musculoskeletal:        General: No tenderness or deformity. Normal range of motion.     Cervical back: Normal range of motion.  Lymphadenopathy:     Cervical: No cervical adenopathy.  Skin:    General: Skin is warm and dry.     Findings: No erythema or rash.  Neurological:     Mental Status: She is alert and oriented to person, place, and time.  Psychiatric:        Behavior: Behavior normal.        Thought Content: Thought content normal.        Judgment: Judgment normal.     Lab Results  Component Value Date   WBC 14.2 (H) 03/16/2023   HGB 11.3 (L) 03/16/2023    HCT 36.5 03/16/2023   MCV 81.5 03/16/2023   PLT 178 03/16/2023   Lab Results  Component Value Date   FERRITIN 266 09/25/2022   IRON 28 09/25/2022   TIBC 193 (L) 09/25/2022   UIBC 165 09/25/2022   IRONPCTSAT 15 09/25/2022   Lab Results  Component Value Date   RETICCTPCT 1.6 09/25/2022   RBC 4.48 03/16/2023   No results found for: "KPAFRELGTCHN", "LAMBDASER", "KAPLAMBRATIO" Lab Results  Component Value Date   IGGSERUM 1,266 07/15/2021   IGA 424 (H) 07/15/2021   IGMSERUM 194 07/15/2021   No results found for: "TOTALPROTELP", "ALBUMINELP", "A1GS", "A2GS", "BETS", "BETA2SER", "GAMS", "MSPIKE", "SPEI"   Chemistry      Component Value Date/Time   NA 141 02/26/2023 1427   NA 141 02/03/2019 1129   K 4.2 02/26/2023 1427   CL 104 02/26/2023 1427   CO2 28 02/26/2023 1427   BUN 33 (H) 02/26/2023 1427   BUN 31 (H) 02/03/2019 1129   CREATININE 0.96 02/26/2023 1427   CREATININE 1.00 (H) 05/20/2021 1545      Component Value Date/Time   CALCIUM 9.9 02/26/2023 1427   ALKPHOS 76 02/26/2023 1427   AST 25 02/26/2023 1427   ALT 19 02/26/2023 1427   BILITOT 0.3 02/26/2023 1427       Impression and Plan: Ms. Pangan is a very pleasant 88 yo caucasian female with history of both Large granular lymphocytic leukemia and RA.   Again, she has a profound leukopenia.  She responded very well when she was in the hospital with Leukine.  For right now, I think we can just follow her along.  Again, she has had no problems with infections.  1 option we might consider would be cyclosporine A  I think there has been some data send this might be helpful in those patients with these episodes of profound leukopenia.  Right now, I would just hold off on this.  We will plan to get her back in another month now.  I think this would be reasonable.   Josph Macho, MD 8/12/20242:08 PM

## 2023-04-01 ENCOUNTER — Ambulatory Visit (INDEPENDENT_AMBULATORY_CARE_PROVIDER_SITE_OTHER): Payer: Medicare HMO | Admitting: Family Medicine

## 2023-04-01 ENCOUNTER — Encounter: Payer: Self-pay | Admitting: Family Medicine

## 2023-04-01 VITALS — BP 130/60 | HR 84 | Temp 98.1°F | Ht 61.0 in | Wt 113.2 lb

## 2023-04-01 DIAGNOSIS — I10 Essential (primary) hypertension: Secondary | ICD-10-CM

## 2023-04-01 DIAGNOSIS — E039 Hypothyroidism, unspecified: Secondary | ICD-10-CM | POA: Diagnosis not present

## 2023-04-01 DIAGNOSIS — R7303 Prediabetes: Secondary | ICD-10-CM | POA: Diagnosis not present

## 2023-04-01 DIAGNOSIS — N182 Chronic kidney disease, stage 2 (mild): Secondary | ICD-10-CM | POA: Diagnosis not present

## 2023-04-01 LAB — HEMOGLOBIN A1C: Hgb A1c MFr Bld: 5.9 % (ref 4.6–6.5)

## 2023-04-01 NOTE — Progress Notes (Signed)
Established Patient Office Visit   Subjective:  Patient ID: [name] [name], female    DOB: 11/01/1934  Age: 88 y.o. MRN: [id number]  Chief Complaint  Patient presents with   Medical Management of Chronic Issues    3 mont follow up. Pt is fasting. No concerns pt states she is doing well.     HPI Encounter Diagnoses  Name Primary?   Prediabetes Yes   Acquired hypothyroidism    Essential hypertension    CKD (chronic kidney disease) stage 2, GFR 60-89 ml/min    For follow-up of above.  Continues to do well and live independently.  Son is present today.  They are using a pill dispenser at home for her with an alarm on it.  Seems to be working well for her she has been quite compliant with her medications.  Appetite has been okay.  Weight has been stable.  Ongoing follow-up with oncology for lymphocytic leukemia.   Review of Systems  Constitutional: Negative.   HENT: Negative.    Eyes:  Negative for blurred vision, discharge and redness.  Respiratory: Negative.    Cardiovascular: Negative.   Gastrointestinal:  Negative for abdominal pain.  Genitourinary: Negative.   Musculoskeletal: Negative.  Negative for myalgias.  Skin:  Negative for rash.  Neurological:  Negative for tingling, loss of consciousness and weakness.  Endo/Heme/Allergies:  Negative for polydipsia.     Current Outpatient Medications:    acetaminophen (TYLENOL) 325 MG tablet, Take 650 mg by mouth as needed for headache., Disp: , Rfl:    benazepril (LOTENSIN) 5 MG tablet, Take 1 tablet daily., Disp: 90 tablet, Rfl: 0   folic acid (FOLVITE) 1 MG tablet, Take 1 mg by mouth daily., Disp: , Rfl:    JARDIANCE 10 MG TABS tablet, TAKE 1 TABLET BY MOUTH DAILY BEFORE BREAKFAST., Disp: 30 tablet, Rfl: 2   leflunomide (ARAVA) 10 MG tablet, Take 10 mg by mouth daily., Disp: , Rfl:    levothyroxine (SYNTHROID) 88 MCG tablet, TAKE 1 TABLET BY MOUTH EVERY DAY BEFORE BREAKFAST, Disp: 90 tablet, Rfl: 1   senna-docusate  (SENOKOT-S) 8.6-50 MG tablet, Take 1 tablet by mouth 2 (two) times daily., Disp: 30 tablet, Rfl: 0   Objective:     BP 130/60   Pulse 84   Temp 98.1 F (36.7 C)   Ht 5\' 1"  (1.549 m)   Wt 113 lb 3.2 oz (51.3 kg)   SpO2 99%   BMI 21.39 kg/m  Wt Readings from Last 3 Encounters:  04/01/23 113 lb 3.2 oz (51.3 kg)  03/16/23 111 lb (50.3 kg)  02/26/23 112 lb (50.8 kg)      Physical Exam Constitutional:      General: She is not in acute distress.    Appearance: Normal appearance. She is not ill-appearing, toxic-appearing or diaphoretic.  HENT:     Head: Normocephalic and atraumatic.     Right Ear: External ear normal.     Left Ear: External ear normal.  Eyes:     General: No scleral icterus.       Right eye: No discharge.        Left eye: No discharge.     Extraocular Movements: Extraocular movements intact.     Conjunctiva/sclera: Conjunctivae normal.  Cardiovascular:     Rate and Rhythm: Normal rate and regular rhythm.  Pulmonary:     Effort: Pulmonary effort is normal. No respiratory distress.     Breath sounds: No wheezing, rhonchi or rales.  Skin:  General: Skin is warm and dry.  Neurological:     Mental Status: She is alert and oriented to person, place, and time.  Psychiatric:        Mood and Affect: Mood normal.        Behavior: Behavior normal.      No results found for any visits on 04/01/23.    The ASCVD Risk score (Arnett DK, et al., 2019) failed to calculate for the following reasons:   The 2019 ASCVD risk score is only valid for ages 12 to 31    Assessment & Plan:   Prediabetes -     Hemoglobin A1c  Acquired hypothyroidism  Essential hypertension  CKD (chronic kidney disease) stage 2, GFR 60-89 ml/min    Return in about 3 months (around 07/02/2023).  Recent labs from oncology reviewed.  CBC and CMP have been stable.  Hypothyroidism well-controlled with current dose of levothyroxine.  Blood pressure well-controlled on Lotensin.   Continues to do well with the Jardiance.  Checking A1c today.  Mliss Sax, MD

## 2023-04-14 ENCOUNTER — Inpatient Hospital Stay (HOSPITAL_BASED_OUTPATIENT_CLINIC_OR_DEPARTMENT_OTHER): Payer: Medicare HMO | Admitting: Hematology & Oncology

## 2023-04-14 ENCOUNTER — Inpatient Hospital Stay: Payer: Medicare HMO | Attending: Hematology & Oncology

## 2023-04-14 ENCOUNTER — Other Ambulatory Visit: Payer: Self-pay | Admitting: Family Medicine

## 2023-04-14 ENCOUNTER — Encounter: Payer: Self-pay | Admitting: Hematology & Oncology

## 2023-04-14 VITALS — BP 148/49 | HR 88 | Temp 98.1°F | Resp 17 | Wt 112.1 lb

## 2023-04-14 DIAGNOSIS — C91Z0 Other lymphoid leukemia not having achieved remission: Secondary | ICD-10-CM | POA: Insufficient documentation

## 2023-04-14 DIAGNOSIS — R7303 Prediabetes: Secondary | ICD-10-CM

## 2023-04-14 DIAGNOSIS — N1832 Chronic kidney disease, stage 3b: Secondary | ICD-10-CM

## 2023-04-14 LAB — LACTATE DEHYDROGENASE: LDH: 200 U/L — ABNORMAL HIGH (ref 98–192)

## 2023-04-14 LAB — SAVE SMEAR(SSMR), FOR PROVIDER SLIDE REVIEW

## 2023-04-14 LAB — CBC WITH DIFFERENTIAL (CANCER CENTER ONLY)
Abs Immature Granulocytes: 0 10*3/uL (ref 0.00–0.07)
Basophils Absolute: 0.1 10*3/uL (ref 0.0–0.1)
Basophils Relative: 1 %
Eosinophils Absolute: 0 10*3/uL (ref 0.0–0.5)
Eosinophils Relative: 0 %
HCT: 37.9 % (ref 36.0–46.0)
Hemoglobin: 11.3 g/dL — ABNORMAL LOW (ref 12.0–15.0)
Immature Granulocytes: 0 %
Lymphocytes Relative: 88 %
Lymphs Abs: 10.2 10*3/uL — ABNORMAL HIGH (ref 0.7–4.0)
MCH: 25.3 pg — ABNORMAL LOW (ref 26.0–34.0)
MCHC: 29.8 g/dL — ABNORMAL LOW (ref 30.0–36.0)
MCV: 84.8 fL (ref 80.0–100.0)
Monocytes Absolute: 0.7 10*3/uL (ref 0.1–1.0)
Monocytes Relative: 6 %
Neutro Abs: 0.5 10*3/uL — ABNORMAL LOW (ref 1.7–7.7)
Neutrophils Relative %: 5 %
Platelet Count: 161 10*3/uL (ref 150–400)
RBC: 4.47 MIL/uL (ref 3.87–5.11)
RDW: 16.6 % — ABNORMAL HIGH (ref 11.5–15.5)
Smear Review: NORMAL
WBC Count: 11.5 10*3/uL — ABNORMAL HIGH (ref 4.0–10.5)
nRBC: 0 % (ref 0.0–0.2)

## 2023-04-14 LAB — CMP (CANCER CENTER ONLY)
ALT: 10 U/L (ref 0–44)
AST: 19 U/L (ref 15–41)
Albumin: 4 g/dL (ref 3.5–5.0)
Alkaline Phosphatase: 80 U/L (ref 38–126)
Anion gap: 7 (ref 5–15)
BUN: 26 mg/dL — ABNORMAL HIGH (ref 8–23)
CO2: 27 mmol/L (ref 22–32)
Calcium: 9.9 mg/dL (ref 8.9–10.3)
Chloride: 106 mmol/L (ref 98–111)
Creatinine: 1.13 mg/dL — ABNORMAL HIGH (ref 0.44–1.00)
GFR, Estimated: 47 mL/min — ABNORMAL LOW (ref >60)
Glucose, Bld: 103 mg/dL — ABNORMAL HIGH (ref 70–99)
Potassium: 5.4 mmol/L — ABNORMAL HIGH (ref 3.5–5.1)
Sodium: 140 mmol/L (ref 135–145)
Total Bilirubin: 0.5 mg/dL (ref 0.3–1.2)
Total Protein: 8.1 g/dL (ref 6.5–8.1)

## 2023-04-14 NOTE — Progress Notes (Signed)
Hematology and Oncology Follow Up Visit  [name] [name] [id number] 1934-10-05 88 y.o. 04/14/2023   Principle Diagnosis:  Large granular lymphocytic leukemia Rheumatoid arthritis   Current Therapy:        Methotrexate 20 mg p.o. weekly- start on 07/27/2021 - stopped 11/07/2022 d/y pancytopenia  GM-CSF/G-CSF q 2-3 week   Interim History:  [name] [name] is here today with her son for follow-up.  She is doing okay.  She still has the cognitive issues.  Because of this, she really does not eat all that much.  She might eat 1 meal a day.  She has had no problem with infections.  There is no fever.  There is no rash.  She has had no change in bowel or bladder habits.  She has had no mouth sores.  She has had no leg rashes.  She has had some leg swelling.  There is been no diarrhea.  Overall, I would say that her performance status is probably ECOG 2-3.   Medications:  Allergies as of 04/14/2023       Reactions   Tramadol Nausea Only   Codeine Phosphate Nausea Only        Medication List        Accurate as of April 14, 2023  1:15 PM. If you have any questions, ask your nurse or doctor.          acetaminophen 325 MG tablet Commonly known as: TYLENOL Take 650 mg by mouth as needed for headache.   benazepril 5 MG tablet Commonly known as: LOTENSIN Take 1 tablet daily.   folic acid 1 MG tablet Commonly known as: FOLVITE Take 1 mg by mouth daily.   Jardiance 10 MG Tabs tablet Generic drug: empagliflozin TAKE 1 TABLET BY MOUTH EVERY DAY BEFORE BREAKFAST What changed: See the new instructions. Changed by: Mliss Sax   leflunomide 10 MG tablet Commonly known as: ARAVA Take 10 mg by mouth daily.   levothyroxine 88 MCG tablet Commonly known as: SYNTHROID TAKE 1 TABLET BY MOUTH EVERY DAY BEFORE BREAKFAST   senna-docusate 8.6-50 MG tablet Commonly known as: Senokot-S Take 1 tablet by mouth 2 (two) times daily.        Allergies:  Allergies   Allergen Reactions   Tramadol Nausea Only   Codeine Phosphate Nausea Only    Past Medical History, Surgical history, Social history, and Family History were reviewed and updated.  Review of Systems: Review of Systems  Constitutional: Negative.   HENT: Negative.    Eyes: Negative.   Respiratory: Negative.    Cardiovascular: Negative.   Gastrointestinal: Negative.   Genitourinary: Negative.   Musculoskeletal: Negative.   Skin: Negative.   Neurological: Negative.   Endo/Heme/Allergies: Negative.   Psychiatric/Behavioral: Negative.       Physical Exam:  weight is 112 lb 1.9 oz (50.9 kg). Her oral temperature is 98.1 F (36.7 C). Her blood pressure is 148/49 (abnormal) and her pulse is 88. Her respiration is 17 and oxygen saturation is 99%.   Wt Readings from Last 3 Encounters:  04/14/23 112 lb 1.9 oz (50.9 kg)  04/01/23 113 lb 3.2 oz (51.3 kg)  03/16/23 111 lb (50.3 kg)   Physical Exam Vitals reviewed.  HENT:     Head: Normocephalic and atraumatic.  Eyes:     Pupils: Pupils are equal, round, and reactive to light.  Cardiovascular:     Rate and Rhythm: Normal rate and regular rhythm.     Heart sounds: Normal heart sounds.  Pulmonary:     Effort: Pulmonary effort is normal.     Breath sounds: Normal breath sounds.  Abdominal:     General: Bowel sounds are normal.     Palpations: Abdomen is soft.  Musculoskeletal:        General: No tenderness or deformity. Normal range of motion.     Cervical back: Normal range of motion.  Lymphadenopathy:     Cervical: No cervical adenopathy.  Skin:    General: Skin is warm and dry.     Findings: No erythema or rash.  Neurological:     Mental Status: She is alert and oriented to person, place, and time.  Psychiatric:        Behavior: Behavior normal.        Thought Content: Thought content normal.        Judgment: Judgment normal.     Lab Results  Component Value Date   WBC 11.5 (H) 04/14/2023   HGB 11.3 (L) 04/14/2023    HCT 37.9 04/14/2023   MCV 84.8 04/14/2023   PLT 161 04/14/2023   Lab Results  Component Value Date   FERRITIN 266 09/25/2022   IRON 28 09/25/2022   TIBC 193 (L) 09/25/2022   UIBC 165 09/25/2022   IRONPCTSAT 15 09/25/2022   Lab Results  Component Value Date   RETICCTPCT 1.6 09/25/2022   RBC 4.47 04/14/2023   No results found for: "KPAFRELGTCHN", "LAMBDASER", "KAPLAMBRATIO" Lab Results  Component Value Date   IGGSERUM 1,266 07/15/2021   IGA 424 (H) 07/15/2021   IGMSERUM 194 07/15/2021   No results found for: "TOTALPROTELP", "ALBUMINELP", "A1GS", "A2GS", "BETS", "BETA2SER", "GAMS", "MSPIKE", "SPEI"   Chemistry      Component Value Date/Time   NA 140 04/14/2023 1150   NA 141 02/03/2019 1129   K 5.4 (H) 04/14/2023 1150   CL 106 04/14/2023 1150   CO2 27 04/14/2023 1150   BUN 26 (H) 04/14/2023 1150   BUN 31 (H) 02/03/2019 1129   CREATININE 1.13 (H) 04/14/2023 1150   CREATININE 1.00 (H) 05/20/2021 1545      Component Value Date/Time   CALCIUM 9.9 04/14/2023 1150   ALKPHOS 80 04/14/2023 1150   AST 19 04/14/2023 1150   ALT 10 04/14/2023 1150   BILITOT 0.5 04/14/2023 1150       Impression and Plan: [name] [name] is a very pleasant 88 yo caucasian female with history of both Large granular lymphocytic leukemia and RA.   She is still quite neutropenic.  However, her ANC is 500 which is actually not bad for her..  I do not think we need to give her any G-CSF today.  Again, I really think that she ate better at that her blood count would be better.  I do still know this will happen because of her cognitive decline.  I know that her son is doing the best that he is doing to try to help her out.  I would like to see her back in 1 month.  Hopefully, we can try to get her blood counts a bit better so that she will be able to enjoy the Holiday season.    Josph Macho, MD 9/10/20241:15 PM

## 2023-04-17 ENCOUNTER — Other Ambulatory Visit: Payer: Self-pay | Admitting: Family Medicine

## 2023-04-17 DIAGNOSIS — N1832 Chronic kidney disease, stage 3b: Secondary | ICD-10-CM

## 2023-04-17 DIAGNOSIS — I10 Essential (primary) hypertension: Secondary | ICD-10-CM

## 2023-04-22 NOTE — Progress Notes (Addendum)
Office Visit Note  Patient: [name] [name]             Date of Birth: Jul 18, 1935           MRN: [id number]             PCP: Mliss Sax, MD Referring: Mliss Sax,* Visit Date: 05/05/2023 Occupation: @GUAROCC @  Subjective:  Medication monitoring   History of Present Illness: [name] [name] is a 88 y.o. female with history of seropositive rheumatoid arthritis and osteoarthritis.  Patient remains on Arava 10 mg 1 tablet daily for rheumatoid arthritis.  She is tolerating Arava without any side effects and has not missed any doses recently.  She denies any signs or symptoms of a rheumatoid arthritis flare.  She has not had any morning stiffness, joint pain, or joint swelling.  She denies any recent or recurrent infections.  Activities of Daily Living:  Patient reports morning stiffness for 0 minutes  Patient Denies nocturnal pain.  Difficulty dressing/grooming: Denies Difficulty climbing stairs: Denies Difficulty getting out of chair: Denies Difficulty using hands for taps, buttons, cutlery, and/or writing: Denies  Review of Systems  Constitutional:  Negative for fatigue.  HENT:  Negative for mouth sores, mouth dryness and nose dryness.   Eyes:  Negative for pain, visual disturbance and dryness.  Respiratory:  Negative for cough, hemoptysis, shortness of breath and difficulty breathing.   Cardiovascular:  Negative for chest pain, palpitations, hypertension and swelling in legs/feet.  Gastrointestinal:  Negative for blood in stool, constipation and diarrhea.  Endocrine: Negative for increased urination.  Genitourinary:  Negative for painful urination.  Musculoskeletal:  Negative for joint pain, joint pain, joint swelling, myalgias, muscle weakness, morning stiffness, muscle tenderness and myalgias.  Skin:  Negative for color change, pallor, rash, hair loss, nodules/bumps, skin tightness, ulcers and sensitivity to sunlight.  Allergic/Immunologic: Negative for  susceptible to infections.  Neurological:  Negative for dizziness, numbness, headaches and weakness.  Hematological:  Negative for swollen glands.  Psychiatric/Behavioral:  Negative for depressed mood and sleep disturbance. The patient is not nervous/anxious.     PMFS History:  Patient Active Problem List   Diagnosis Date Noted   Constipation by delayed colonic transit 11/17/2022   Aphthous ulcer 11/04/2022   Neutropenic fever (HCC) 10/31/2022   Dehydration 10/29/2022   AKI (acute kidney injury) (HCC) 10/28/2022   Pancytopenia (HCC) 10/28/2022   Stomatitis 10/28/2022   Pre-diabetes 10/28/2022   Protein-calorie malnutrition, severe 09/25/2022   Acute diarrhea 09/24/2022   Unintentional weight loss 09/24/2022   Impaired mental alertness 09/24/2022   Acute renal failure superimposed on stage 3a chronic kidney disease (HCC) 09/24/2022   Need for RSV immunization 04/23/2022   Prediabetes 04/23/2022   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   Goals of care, counseling/discussion 08/06/2021   Need for influenza vaccination 04/18/2021   Iron deficiency anemia 11/20/2020   Lymphocytosis 10/16/2020   Neutropenia (HCC) 10/16/2020   Paroxysmal supraventricular tachycardia (HCC) 10/15/2020   Elevated LDL cholesterol level 01/12/2020   DDD (degenerative disc disease), cervical 07/13/2018   Chronic kidney disease (CKD) stage G3b 01/21/2017   History of hypothyroidism 01/20/2017   History of congestive heart failure 08/26/2016   Osteopenia of multiple sites 08/25/2016   DDD cervical spine 08/25/2016   Primary osteoarthritis of both hands 08/25/2016   High risk medication use 06/11/2016   CHF, acute (HCC) 02/01/2013   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   Rheumatoid arthritis(714.0) 01/19/2007    Family History  Problem Relation Age of Onset   Diabetes Father    Heart disease Father    Cancer Sister        breast    Heart disease Sister    Heart attack Brother    Heart attack Brother    Heart attack Brother    Past Surgical History:  Procedure Laterality Date   ABDOMINAL HYSTERECTOMY     APPENDECTOMY     CHOLECYSTECTOMY     NECK SURGERY     Social History   Social History Narrative   Not on file   Immunization History  Administered Date(s) Administered   Fluad Quad(high Dose 65+) 05/06/2019, 04/18/2021   Influenza,inj,Quad PF,6+ Mos 07/06/2013   Pneumococcal Conjugate-13 07/06/2013   Pneumococcal Polysaccharide-23 04/09/2011, 06/21/2012   Tdap 04/09/2011     Objective: Vital Signs: BP 137/62 (BP Location: Left Arm, Patient Position: Sitting, Cuff Size: Normal)   Pulse 82   Resp 13   Ht 5\' 2"  (1.575 m)   Wt 113 lb 6.4 oz (51.4 kg)   BMI 20.74 kg/m    Physical Exam Vitals and nursing note reviewed.  Constitutional:      Appearance: She is well-developed.  HENT:     Head: Normocephalic and atraumatic.  Eyes:     Conjunctiva/sclera: Conjunctivae normal.  Cardiovascular:     Rate and Rhythm: Normal rate and regular rhythm.     Heart sounds: Murmur heard.  Pulmonary:     Effort: Pulmonary effort is normal.     Breath sounds: Normal breath sounds.  Abdominal:     General: Bowel sounds are normal.     Palpations: Abdomen is soft.  Musculoskeletal:     Cervical back: Normal range of motion.  Skin:    General: Skin is warm and dry.     Capillary Refill: Capillary refill takes less than 2 seconds.  Neurological:     Mental Status: She is alert and oriented to person, place, and time.  Psychiatric:        Behavior: Behavior normal.      Musculoskeletal Exam: C-spine has  limited range of motion.  Sternoclavicular joint thickening noted bilaterally.  No midline spinal tenderness.  Shoulder joints have good range of motion.  Elbow joints, wrist joints, MCPs, PIPs, DIPs have good range of motion with no synovitis.  Hip joints have good range of motion with no groin pain.  Knee joints have good range of motion with no warmth or effusion.  Ankle joints have good range of motion with no tenderness or joint swelling.  CDAI Exam: CDAI Score: -- Patient Global: 0 / 100; Provider Global: 0 / 100 Swollen: --; Tender: -- Joint Exam 05/05/2023   No joint exam has been documented for this visit   There is currently no information documented on the homunculus. Go to the Rheumatology activity and complete the homunculus joint exam.  Investigation: No additional findings.  Imaging: No results found.  Recent Labs: Lab Results  Component Value Date   WBC 11.5 (H) 04/14/2023   HGB 11.3 (L) 04/14/2023   PLT 161 04/14/2023   NA 140 04/14/2023   K 5.4 (H) 04/14/2023   CL 106 04/14/2023  CO2 27 04/14/2023   GLUCOSE 103 (H) 04/14/2023   BUN 26 (H) 04/14/2023   CREATININE 1.13 (H) 04/14/2023   BILITOT 0.5 04/14/2023   ALKPHOS 80 04/14/2023   AST 19 04/14/2023   ALT 10 04/14/2023   PROT 8.1 04/14/2023   ALBUMIN 4.0 04/14/2023   CALCIUM 9.9 04/14/2023   GFRAA 50 (L) 08/09/2020    Speciality Comments: PLQ Eye Exam: 10/04/2019 @ [facility]  Prior therapy: Methotrexate (lung injury) NOT CURRENTLY ON PLQ  Procedures:  No procedures performed Allergies: Tramadol and Codeine phosphate   Assessment / Plan:     Visit Diagnoses: Rheumatoid arthritis involving multiple sites with positive rheumatoid factor (HCC): She has no synovitis on examination today.  She has not had any signs or symptoms of a rheumatoid arthritis flare.  She has clinically been doing well taking Arava 10 mg 1 tablet by mouth daily.  She is tolerating Arava without any side effects and has  not missed any doses recently.  She has not been experiencing any morning stiffness, nocturnal pain, or difficulty with ADLs.  She will remain on low-dose Arava 10 mg 1 tablet by mouth daily with close lab monitoring.  She was advised to notify us if she develop signs or symptoms of a flare. She will follow up in the office in 5 months or sooner if needed.    High risk medication use - Arava 10 mg 1 tablet daily for rheumatoid arthritis.   MTX previously prescribed by Dr. Myna Hidalgo for LGL leukemia from 12/22- 11/07/2022.MTX d/c-pancytopenia. CBC and CMP updated on 04/14/23.  Her next lab work will be due in December and every 3 months.  No recent or recurrent infections.  Discussed the importance of holding arava if she develops signs or symptoms of an infection and to resume once the infection has completely cleared.   Primary osteoarthritis of both hands: She has no synovitis on examination today.  Complete fist formation bilaterally.  DDD (degenerative disc disease), cervical: Limited range of motion with lateral rotation.  Trochanteric bursitis, right hip: Not currently symptomatic.  Osteopenia of multiple sites - DEXA ordered by Dr. Roswell Nickel completed yet.  Chronic kidney disease (CKD) stage G3b: Creatinine was 1.13 and GFR was 47 on 04/14/2023.  She will continue to require close lab monitoring.   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   Methotrexate-- LGL leukemia from December 2022 till 11/07/2022.  Methotrexate was discontinued due to pancytopenia. Reviewed office visit note from 04/14/2023.  Updated lab work was performed on 04/14/2023. Next follow up visit scheduled 05/13/23.   Other neutropenia (HCC): Absolute neutrophils 0.5 on 04/14/23.    Other medical conditions are listed as follows:  History of iron deficiency anemia  History of congestive heart failure  Essential hypertension  Dyslipidemia  History of  hypothyroidism  Lymphocytosis  Orders: No orders of the defined types were placed in this encounter.  No orders of the defined types were placed in this encounter.   Follow-Up Instructions: Return in about 5 months (around 10/03/2023) for Rheumatoid arthritis.   Gearldine Bienenstock, PA-C  Note - This record has been created using Dragon software.  Chart creation errors have been sought, but may not always  have been located. Such creation errors do not reflect on  the standard of medical care.

## 2023-05-05 ENCOUNTER — Encounter: Payer: Self-pay | Admitting: Physician Assistant

## 2023-05-05 ENCOUNTER — Ambulatory Visit: Payer: Medicare HMO | Attending: Physician Assistant | Admitting: Physician Assistant

## 2023-05-05 VITALS — BP 137/62 | HR 82 | Resp 13 | Ht 62.0 in | Wt 113.4 lb

## 2023-05-05 DIAGNOSIS — M0579 Rheumatoid arthritis with rheumatoid factor of multiple sites without organ or systems involvement: Secondary | ICD-10-CM | POA: Diagnosis not present

## 2023-05-05 DIAGNOSIS — M7061 Trochanteric bursitis, right hip: Secondary | ICD-10-CM

## 2023-05-05 DIAGNOSIS — N1832 Chronic kidney disease, stage 3b: Secondary | ICD-10-CM

## 2023-05-05 DIAGNOSIS — E785 Hyperlipidemia, unspecified: Secondary | ICD-10-CM

## 2023-05-05 DIAGNOSIS — Z862 Personal history of diseases of the blood and blood-forming organs and certain disorders involving the immune mechanism: Secondary | ICD-10-CM

## 2023-05-05 DIAGNOSIS — Z8679 Personal history of other diseases of the circulatory system: Secondary | ICD-10-CM | POA: Diagnosis not present

## 2023-05-05 DIAGNOSIS — M19041 Primary osteoarthritis, right hand: Secondary | ICD-10-CM

## 2023-05-05 DIAGNOSIS — D708 Other neutropenia: Secondary | ICD-10-CM | POA: Diagnosis not present

## 2023-05-05 DIAGNOSIS — I10 Essential (primary) hypertension: Secondary | ICD-10-CM | POA: Diagnosis not present

## 2023-05-05 DIAGNOSIS — M5030 Other cervical disc degeneration, unspecified cervical region: Secondary | ICD-10-CM | POA: Diagnosis not present

## 2023-05-05 DIAGNOSIS — Z8639 Personal history of other endocrine, nutritional and metabolic disease: Secondary | ICD-10-CM

## 2023-05-05 DIAGNOSIS — M8589 Other specified disorders of bone density and structure, multiple sites: Secondary | ICD-10-CM | POA: Diagnosis not present

## 2023-05-05 DIAGNOSIS — Z79899 Other long term (current) drug therapy: Secondary | ICD-10-CM | POA: Diagnosis not present

## 2023-05-05 DIAGNOSIS — M19042 Primary osteoarthritis, left hand: Secondary | ICD-10-CM

## 2023-05-05 DIAGNOSIS — D72820 Lymphocytosis (symptomatic): Secondary | ICD-10-CM

## 2023-05-05 DIAGNOSIS — C91Z0 Other lymphoid leukemia not having achieved remission: Secondary | ICD-10-CM

## 2023-05-05 NOTE — Patient Instructions (Signed)
Standing Labs We placed an order today for your standing lab work.   Please have your standing labs drawn in December and every 3 months   Please have your labs drawn 2 weeks prior to your appointment so that the provider can discuss your lab results at your appointment, if possible.  Please note that you may see your imaging and lab results in MyChart before we have reviewed them. We will contact you once all results are reviewed. Please allow our office up to 72 hours to thoroughly review all of the results before contacting the office for clarification of your results.  WALK-IN LAB HOURS  Monday through Thursday from 8:00 am -12:30 pm and 1:00 pm-5:00 pm and Friday from 8:00 am-12:00 pm.  Patients with office visits requiring labs will be seen before walk-in labs.  You may encounter longer than normal wait times. Please allow additional time. Wait times may be shorter on  Monday and Thursday afternoons.  We do not book appointments for walk-in labs. We appreciate your patience and understanding with our staff.   Labs are drawn by [facility]. Please bring your co-pay at the time of your lab draw.  You may receive a bill from [facility] for your lab work.  Please note if you are on Hydroxychloroquine and and an order has been placed for a Hydroxychloroquine level,  you will need to have it drawn 4 hours or more after your last dose.  If you wish to have your labs drawn at another location, please call the office 24 hours in advance so we can fax the orders.  The office is located at [address], [place], [place] [address]   If you have any questions regarding directions or hours of operation,  please call [phone].   As a reminder, please drink plenty of water prior to coming for your lab work. Thanks!

## 2023-05-13 ENCOUNTER — Inpatient Hospital Stay (HOSPITAL_BASED_OUTPATIENT_CLINIC_OR_DEPARTMENT_OTHER): Payer: Medicare HMO | Admitting: Hematology & Oncology

## 2023-05-13 ENCOUNTER — Inpatient Hospital Stay: Payer: Medicare HMO | Attending: Hematology & Oncology

## 2023-05-13 ENCOUNTER — Encounter: Payer: Self-pay | Admitting: Hematology & Oncology

## 2023-05-13 ENCOUNTER — Telehealth: Payer: Self-pay

## 2023-05-13 VITALS — BP 145/45 | HR 85 | Temp 98.1°F | Resp 20 | Ht 61.5 in | Wt 114.0 lb

## 2023-05-13 DIAGNOSIS — M069 Rheumatoid arthritis, unspecified: Secondary | ICD-10-CM | POA: Diagnosis not present

## 2023-05-13 DIAGNOSIS — D709 Neutropenia, unspecified: Secondary | ICD-10-CM | POA: Diagnosis not present

## 2023-05-13 DIAGNOSIS — Z79899 Other long term (current) drug therapy: Secondary | ICD-10-CM | POA: Diagnosis not present

## 2023-05-13 DIAGNOSIS — C91Z0 Other lymphoid leukemia not having achieved remission: Secondary | ICD-10-CM

## 2023-05-13 LAB — CBC WITH DIFFERENTIAL (CANCER CENTER ONLY)
Abs Immature Granulocytes: 0 10*3/uL (ref 0.00–0.07)
Basophils Absolute: 0.1 10*3/uL (ref 0.0–0.1)
Basophils Relative: 1 %
Eosinophils Absolute: 0 10*3/uL (ref 0.0–0.5)
Eosinophils Relative: 0 %
HCT: 37.6 % (ref 36.0–46.0)
Hemoglobin: 11.5 g/dL — ABNORMAL LOW (ref 12.0–15.0)
Immature Granulocytes: 0 %
Lymphocytes Relative: 87 %
Lymphs Abs: 7.2 10*3/uL — ABNORMAL HIGH (ref 0.7–4.0)
MCH: 25.6 pg — ABNORMAL LOW (ref 26.0–34.0)
MCHC: 30.6 g/dL (ref 30.0–36.0)
MCV: 83.6 fL (ref 80.0–100.0)
Monocytes Absolute: 0.7 10*3/uL (ref 0.1–1.0)
Monocytes Relative: 8 %
Neutro Abs: 0.4 10*3/uL — CL (ref 1.7–7.7)
Neutrophils Relative %: 4 %
Platelet Count: 163 10*3/uL (ref 150–400)
RBC: 4.5 MIL/uL (ref 3.87–5.11)
RDW: 16.4 % — ABNORMAL HIGH (ref 11.5–15.5)
Smear Review: NORMAL
WBC Count: 8.4 10*3/uL (ref 4.0–10.5)
nRBC: 0 % (ref 0.0–0.2)

## 2023-05-13 LAB — CMP (CANCER CENTER ONLY)
ALT: 10 U/L (ref 0–44)
AST: 21 U/L (ref 15–41)
Albumin: 3.6 g/dL (ref 3.5–5.0)
Alkaline Phosphatase: 70 U/L (ref 38–126)
Anion gap: 7 (ref 5–15)
BUN: 31 mg/dL — ABNORMAL HIGH (ref 8–23)
CO2: 26 mmol/L (ref 22–32)
Calcium: 9.4 mg/dL (ref 8.9–10.3)
Chloride: 106 mmol/L (ref 98–111)
Creatinine: 0.95 mg/dL (ref 0.44–1.00)
GFR, Estimated: 58 mL/min — ABNORMAL LOW (ref >60)
Glucose, Bld: 91 mg/dL (ref 70–99)
Potassium: 5 mmol/L (ref 3.5–5.1)
Sodium: 139 mmol/L (ref 135–145)
Total Bilirubin: 0.4 mg/dL (ref 0.3–1.2)
Total Protein: 7.7 g/dL (ref 6.5–8.1)

## 2023-05-13 LAB — LACTATE DEHYDROGENASE: LDH: 181 U/L (ref 98–192)

## 2023-05-13 LAB — SAVE SMEAR(SSMR), FOR PROVIDER SLIDE REVIEW

## 2023-05-13 MED ORDER — MEGESTROL ACETATE 400 MG/10ML PO SUSP
400.0000 mg | Freq: Every day | ORAL | 3 refills | Status: DC
Start: 2023-05-13 — End: 2024-03-03

## 2023-05-13 NOTE — Telephone Encounter (Signed)
Critical ANC of 0.38 received from lab, MD notified.

## 2023-05-13 NOTE — Progress Notes (Signed)
BP remains elevated, has not taken med this morning, due at 10am.  She does not have a way to monitor BP at home.  Son fills her automatic med dispenser.  Encouraged compliance with meds.

## 2023-05-13 NOTE — Progress Notes (Signed)
Hematology and Oncology Follow Up Visit  LATINYA FORSETH [id number] April 20, 1935 88 y.o. 05/13/2023   Principle Diagnosis:  Large granular lymphocytic leukemia Rheumatoid arthritis   Current Therapy:        Methotrexate 20 mg p.o. weekly- start on 07/27/2021 - stopped 11/07/2022 d/t pancytopenia  GM-CSF/G-CSF q 2-3 week   Interim History:  Ms. Miner is here today with her son for follow-up.  She is doing okay.  She still has the cognitive issues.  Because of this, she really does not eat all that much.  She might eat 1 meal a day.  I will go ahead and get her on some [name] elixir.  Lets see if this might help with her appetite.  She has had no problem with infections.  There is no fever.  There is no rash.  She has had no change in bowel or bladder habits.  She has had no mouth sores.  She has had no leg rashes.  She has had some leg swelling.  There is been no diarrhea.  Overall, I would say that her performance status is probably ECOG 2-3.   Medications:  Allergies as of 05/13/2023       Reactions   Tramadol Nausea Only   Codeine Phosphate Nausea Only        Medication List        Accurate as of May 13, 2023 10:51 AM. If you have any questions, ask your nurse or doctor.          acetaminophen 325 MG tablet Commonly known as: TYLENOL Take 650 mg by mouth as needed for headache.   benazepril 5 MG tablet Commonly known as: LOTENSIN TAKE 1 TABLET DAILY   folic acid 1 MG tablet Commonly known as: FOLVITE Take 1 mg by mouth daily.   Jardiance 10 MG Tabs tablet Generic drug: empagliflozin TAKE 1 TABLET BY MOUTH EVERY DAY BEFORE BREAKFAST   leflunomide 10 MG tablet Commonly known as: ARAVA Take 10 mg by mouth daily.   levothyroxine 88 MCG tablet Commonly known as: SYNTHROID TAKE 1 TABLET BY MOUTH EVERY DAY BEFORE BREAKFAST   senna-docusate 8.6-50 MG tablet Commonly known as: Senokot-S Take 1 tablet by mouth 2 (two) times daily.        Allergies:   Allergies  Allergen Reactions   Tramadol Nausea Only   Codeine Phosphate Nausea Only    Past Medical History, Surgical history, Social history, and Family History were reviewed and updated.  Review of Systems: Review of Systems  Constitutional: Negative.   HENT: Negative.    Eyes: Negative.   Respiratory: Negative.    Cardiovascular: Negative.   Gastrointestinal: Negative.   Genitourinary: Negative.   Musculoskeletal: Negative.   Skin: Negative.   Neurological: Negative.   Endo/Heme/Allergies: Negative.   Psychiatric/Behavioral: Negative.       Physical Exam:  height is 5' 1.5" (1.562 m) and weight is 114 lb (51.7 kg). Her oral temperature is 98.1 F (36.7 C). Her blood pressure is 145/45 (abnormal) and her pulse is 85. Her respiration is 20 and oxygen saturation is 100%.   Wt Readings from Last 3 Encounters:  05/13/23 114 lb (51.7 kg)  05/05/23 113 lb 6.4 oz (51.4 kg)  04/14/23 112 lb 1.9 oz (50.9 kg)   Physical Exam Vitals reviewed.  HENT:     Head: Normocephalic and atraumatic.  Eyes:     Pupils: Pupils are equal, round, and reactive to light.  Cardiovascular:     Rate and  Rhythm: Normal rate and regular rhythm.     Heart sounds: Normal heart sounds.  Pulmonary:     Effort: Pulmonary effort is normal.     Breath sounds: Normal breath sounds.  Abdominal:     General: Bowel sounds are normal.     Palpations: Abdomen is soft.  Musculoskeletal:        General: No tenderness or deformity. Normal range of motion.     Cervical back: Normal range of motion.  Lymphadenopathy:     Cervical: No cervical adenopathy.  Skin:    General: Skin is warm and dry.     Findings: No erythema or rash.  Neurological:     Mental Status: She is alert and oriented to person, place, and time.  Psychiatric:        Behavior: Behavior normal.        Thought Content: Thought content normal.        Judgment: Judgment normal.     Lab Results  Component Value Date   WBC 8.4  05/13/2023   HGB 11.5 (L) 05/13/2023   HCT 37.6 05/13/2023   MCV 83.6 05/13/2023   PLT 163 05/13/2023   Lab Results  Component Value Date   FERRITIN 266 09/25/2022   IRON 28 09/25/2022   TIBC 193 (L) 09/25/2022   UIBC 165 09/25/2022   IRONPCTSAT 15 09/25/2022   Lab Results  Component Value Date   RETICCTPCT 1.6 09/25/2022   RBC 4.50 05/13/2023   No results found for: "KPAFRELGTCHN", "LAMBDASER", "KAPLAMBRATIO" Lab Results  Component Value Date   IGGSERUM 1,266 07/15/2021   IGA 424 (H) 07/15/2021   IGMSERUM 194 07/15/2021   No results found for: "TOTALPROTELP", "ALBUMINELP", "A1GS", "A2GS", "BETS", "BETA2SER", "GAMS", "MSPIKE", "SPEI"   Chemistry      Component Value Date/Time   NA 139 05/13/2023 0926   NA 141 02/03/2019 1129   K 5.0 05/13/2023 0926   CL 106 05/13/2023 0926   CO2 26 05/13/2023 0926   BUN 31 (H) 05/13/2023 0926   BUN 31 (H) 02/03/2019 1129   CREATININE 0.95 05/13/2023 0926   CREATININE 1.00 (H) 05/20/2021 1545      Component Value Date/Time   CALCIUM 9.4 05/13/2023 0926   ALKPHOS 70 05/13/2023 0926   AST 21 05/13/2023 0926   ALT 10 05/13/2023 0926   BILITOT 0.4 05/13/2023 0926       Impression and Plan: Ms. Walkinshaw is a very pleasant 88 yo caucasian female with history of both Large granular lymphocytic leukemia and RA.   She is still quite neutropenic.  I will hold on the G-CSF today.  I am just not sure how much this will really help [place] out.  Maybe, if we get her to get better nutritional intake, this might help her bone marrow and with her immunity.  We will still plan to follow her up.  I will have her come back in about a month or so.  Josph Macho, MD 10/9/202410:51 AM

## 2023-06-18 ENCOUNTER — Inpatient Hospital Stay: Payer: Medicare HMO | Attending: Hematology & Oncology

## 2023-06-18 ENCOUNTER — Inpatient Hospital Stay: Payer: Medicare HMO

## 2023-06-18 ENCOUNTER — Encounter: Payer: Self-pay | Admitting: Hematology & Oncology

## 2023-06-18 ENCOUNTER — Inpatient Hospital Stay: Payer: Medicare HMO | Admitting: Hematology & Oncology

## 2023-06-18 VITALS — BP 133/40 | HR 81 | Temp 98.7°F | Resp 20 | Ht 61.5 in | Wt 122.1 lb

## 2023-06-18 DIAGNOSIS — C91Z0 Other lymphoid leukemia not having achieved remission: Secondary | ICD-10-CM | POA: Insufficient documentation

## 2023-06-18 DIAGNOSIS — Z79899 Other long term (current) drug therapy: Secondary | ICD-10-CM | POA: Diagnosis not present

## 2023-06-18 DIAGNOSIS — M069 Rheumatoid arthritis, unspecified: Secondary | ICD-10-CM | POA: Diagnosis not present

## 2023-06-18 DIAGNOSIS — D709 Neutropenia, unspecified: Secondary | ICD-10-CM | POA: Diagnosis not present

## 2023-06-18 LAB — CBC WITH DIFFERENTIAL (CANCER CENTER ONLY)
Abs Immature Granulocytes: 0.01 10*3/uL (ref 0.00–0.07)
Basophils Absolute: 0 10*3/uL (ref 0.0–0.1)
Basophils Relative: 0 %
Eosinophils Absolute: 0 10*3/uL (ref 0.0–0.5)
Eosinophils Relative: 0 %
HCT: 35.9 % — ABNORMAL LOW (ref 36.0–46.0)
Hemoglobin: 11 g/dL — ABNORMAL LOW (ref 12.0–15.0)
Immature Granulocytes: 0 %
Lymphocytes Relative: 87 %
Lymphs Abs: 12 10*3/uL — ABNORMAL HIGH (ref 0.7–4.0)
MCH: 25.6 pg — ABNORMAL LOW (ref 26.0–34.0)
MCHC: 30.6 g/dL (ref 30.0–36.0)
MCV: 83.5 fL (ref 80.0–100.0)
Monocytes Absolute: 1 10*3/uL (ref 0.1–1.0)
Monocytes Relative: 7 %
Neutro Abs: 0.8 10*3/uL — ABNORMAL LOW (ref 1.7–7.7)
Neutrophils Relative %: 6 %
Platelet Count: 172 10*3/uL (ref 150–400)
RBC: 4.3 MIL/uL (ref 3.87–5.11)
RDW: 15.4 % (ref 11.5–15.5)
Smear Review: NORMAL
WBC Count: 13.8 10*3/uL — ABNORMAL HIGH (ref 4.0–10.5)
nRBC: 0 % (ref 0.0–0.2)

## 2023-06-18 LAB — CMP (CANCER CENTER ONLY)
ALT: 10 U/L (ref 0–44)
AST: 15 U/L (ref 15–41)
Albumin: 4.3 g/dL (ref 3.5–5.0)
Alkaline Phosphatase: 61 U/L (ref 38–126)
Anion gap: 9 (ref 5–15)
BUN: 39 mg/dL — ABNORMAL HIGH (ref 8–23)
CO2: 23 mmol/L (ref 22–32)
Calcium: 10 mg/dL (ref 8.9–10.3)
Chloride: 108 mmol/L (ref 98–111)
Creatinine: 1.41 mg/dL — ABNORMAL HIGH (ref 0.44–1.00)
GFR, Estimated: 36 mL/min — ABNORMAL LOW (ref >60)
Glucose, Bld: 94 mg/dL (ref 70–99)
Potassium: 5.4 mmol/L — ABNORMAL HIGH (ref 3.5–5.1)
Sodium: 140 mmol/L (ref 135–145)
Total Bilirubin: 0.4 mg/dL (ref <1.2)
Total Protein: 7.9 g/dL (ref 6.5–8.1)

## 2023-06-18 LAB — LACTATE DEHYDROGENASE: LDH: 192 U/L (ref 98–192)

## 2023-06-18 LAB — SAVE SMEAR(SSMR), FOR PROVIDER SLIDE REVIEW

## 2023-06-18 NOTE — Progress Notes (Signed)
Hematology and Oncology Follow Up Visit  [name] [name] [id number] [birth year] 88 y.o. 06/18/2023   Principle Diagnosis:  Large granular lymphocytic leukemia Rheumatoid arthritis   Current Therapy:        Methotrexate 20 mg p.o. weekly- start on 07/27/2021 - stopped 11/07/2022 d/t pancytopenia  GM-CSF/G-CSF q 2-3 week   Interim History:  [name] [name] is here today with her son for follow-up.  She is looking a lot better.  She has gained weight.  She is eating a little bit better.  She is taking the [name] elixir more on schedule.  She has had no obvious bleeding.  There is been no fever.  She has had no cough or shortness of breath.  She will be at her son's for Thanksgiving.  Hopefully, she will have a nice Thanksgiving meal.  She has had no rashes.  Overall, I would say that her performance status is probably ECOG 2.    Medications:  Allergies as of 06/18/2023       Reactions   Tramadol Nausea Only   Codeine Phosphate Nausea Only        Medication List        Accurate as of June 18, 2023 12:27 PM. If you have any questions, ask your nurse or doctor.          acetaminophen 325 MG tablet Commonly known as: TYLENOL Take 650 mg by mouth as needed for headache.   benazepril 5 MG tablet Commonly known as: LOTENSIN TAKE 1 TABLET DAILY   folic acid 1 MG tablet Commonly known as: FOLVITE Take 1 mg by mouth daily.   Jardiance 10 MG Tabs tablet Generic drug: empagliflozin TAKE 1 TABLET BY MOUTH EVERY DAY BEFORE BREAKFAST   leflunomide 10 MG tablet Commonly known as: ARAVA Take 10 mg by mouth daily.   levothyroxine 88 MCG tablet Commonly known as: SYNTHROID TAKE 1 TABLET BY MOUTH EVERY DAY BEFORE BREAKFAST   megestrol 400 MG/10ML suspension Commonly known as: [name] Take 10 mLs (400 mg total) by mouth daily.   senna-docusate 8.6-50 MG tablet Commonly known as: Senokot-S Take 1 tablet by mouth 2 (two) times daily.        Allergies:  Allergies   Allergen Reactions   Tramadol Nausea Only   Codeine Phosphate Nausea Only    Past Medical History, Surgical history, Social history, and Family History were reviewed and updated.  Review of Systems: Review of Systems  Constitutional: Negative.   HENT: Negative.    Eyes: Negative.   Respiratory: Negative.    Cardiovascular: Negative.   Gastrointestinal: Negative.   Genitourinary: Negative.   Musculoskeletal: Negative.   Skin: Negative.   Neurological: Negative.   Endo/Heme/Allergies: Negative.   Psychiatric/Behavioral: Negative.       Physical Exam:  height is 5' 1.5" (1.562 m) and weight is 122 lb 1.9 oz (55.4 kg). Her oral temperature is 98.7 F (37.1 C). Her blood pressure is 133/40 (abnormal) and her pulse is 81. Her respiration is 20 and oxygen saturation is 100%.   Wt Readings from Last 3 Encounters:  06/18/23 122 lb 1.9 oz (55.4 kg)  05/13/23 114 lb (51.7 kg)  05/05/23 113 lb 6.4 oz (51.4 kg)   Physical Exam Vitals reviewed.  HENT:     Head: Normocephalic and atraumatic.  Eyes:     Pupils: Pupils are equal, round, and reactive to light.  Cardiovascular:     Rate and Rhythm: Normal rate and regular rhythm.     Heart  sounds: Normal heart sounds.  Pulmonary:     Effort: Pulmonary effort is normal.     Breath sounds: Normal breath sounds.  Abdominal:     General: Bowel sounds are normal.     Palpations: Abdomen is soft.  Musculoskeletal:        General: No tenderness or deformity. Normal range of motion.     Cervical back: Normal range of motion.  Lymphadenopathy:     Cervical: No cervical adenopathy.  Skin:    General: Skin is warm and dry.     Findings: No erythema or rash.  Neurological:     Mental Status: She is alert and oriented to person, place, and time.  Psychiatric:        Behavior: Behavior normal.        Thought Content: Thought content normal.        Judgment: Judgment normal.     Lab Results  Component Value Date   WBC 13.8 (H)  06/18/2023   HGB 11.0 (L) 06/18/2023   HCT 35.9 (L) 06/18/2023   MCV 83.5 06/18/2023   PLT 172 06/18/2023   Lab Results  Component Value Date   FERRITIN 266 09/25/2022   IRON 28 09/25/2022   TIBC 193 (L) 09/25/2022   UIBC 165 09/25/2022   IRONPCTSAT 15 09/25/2022   Lab Results  Component Value Date   RETICCTPCT 1.6 09/25/2022   RBC 4.30 06/18/2023   No results found for: "KPAFRELGTCHN", "LAMBDASER", "KAPLAMBRATIO" Lab Results  Component Value Date   IGGSERUM 1,266 07/15/2021   IGA 424 (H) 07/15/2021   IGMSERUM 194 07/15/2021   No results found for: "TOTALPROTELP", "ALBUMINELP", "A1GS", "A2GS", "BETS", "BETA2SER", "GAMS", "MSPIKE", "SPEI"   Chemistry      Component Value Date/Time   NA 139 05/13/2023 0926   NA 141 02/03/2019 1129   K 5.0 05/13/2023 0926   CL 106 05/13/2023 0926   CO2 26 05/13/2023 0926   BUN 31 (H) 05/13/2023 0926   BUN 31 (H) 02/03/2019 1129   CREATININE 0.95 05/13/2023 0926   CREATININE 1.00 (H) 05/20/2021 1545      Component Value Date/Time   CALCIUM 9.4 05/13/2023 0926   ALKPHOS 70 05/13/2023 0926   AST 21 05/13/2023 0926   ALT 10 05/13/2023 0926   BILITOT 0.4 05/13/2023 0926       Impression and Plan: [name] [name] is a very pleasant 88 yo caucasian female with history of both Large granular lymphocytic leukemia and RA.   Thankfully, the neutropenia is a little bit better today.  We will still hold on the G-CSF.  I still think that we have to give this right now.  Maybe, we might be able to get her close to Christmas.  We may need to give her a dose of G -CSF at that time.  I am happy that she seems to be doing better.  The weight gain is certainly helpful.  Maybe, the weight gain will have some to do with her blood counts improving.   Josph Macho, MD 11/14/202412:27 PM

## 2023-06-26 IMAGING — CT CT BIOPSY
1 of 2 series · 15 of 30 positions shown, 19 images · non-contrast
Comparison: none

INDICATION: 86-year-old with profound neutropenia and marked leukocytosis.
Evaluate for leukemia.

[Series 2: i-spiral 5.0 br40 · axial · 0.77mm/px · z∈[-500,-398]mm · 15 of 33 slices shown, 19 images]
[im 2/33  mediastinal]
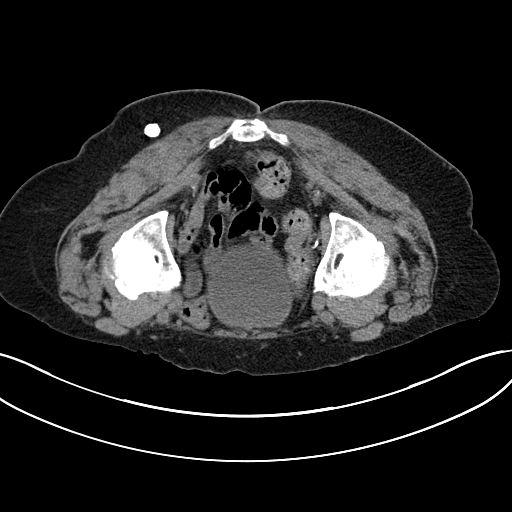
[im 2/33  lung]
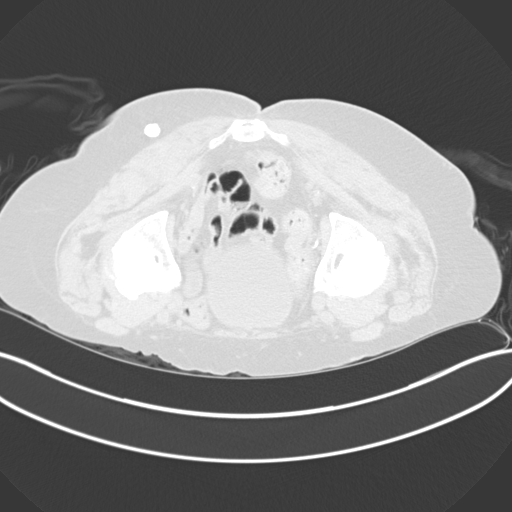
[im 5/33  lung]
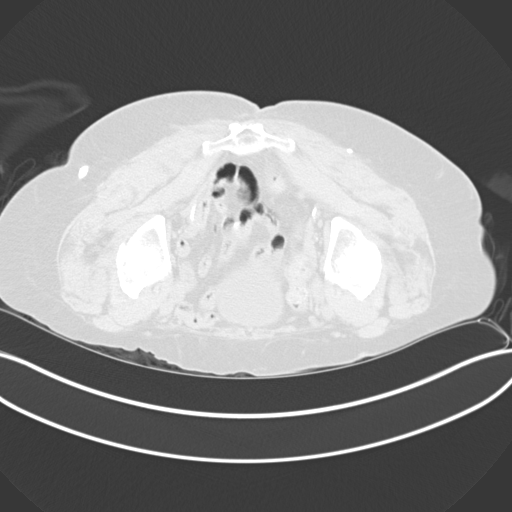
[im 7/33  lung]
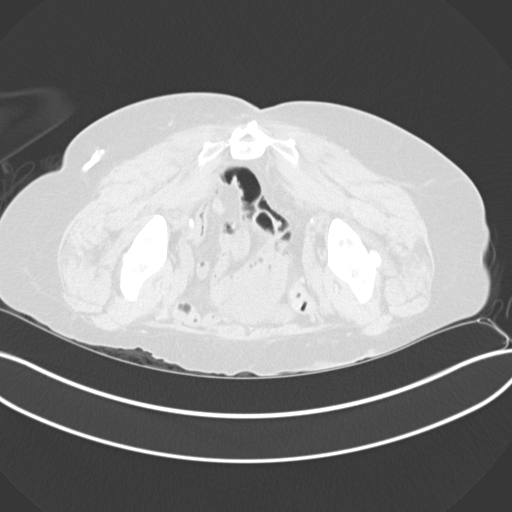
[im 9/33  lung]
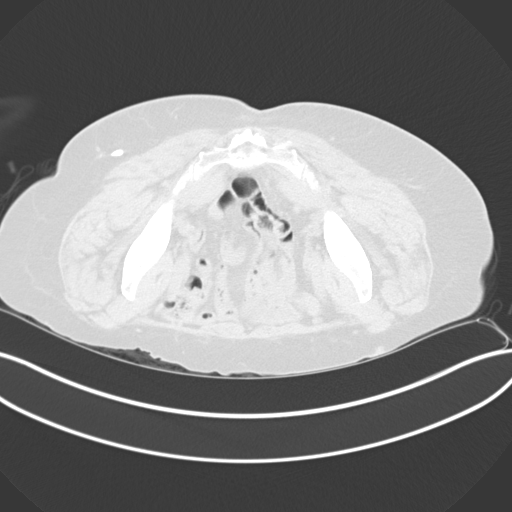
[im 12/33  mediastinal]
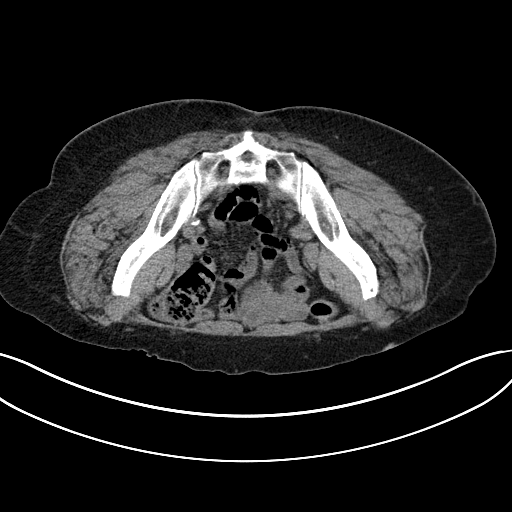
[im 12/33  lung]
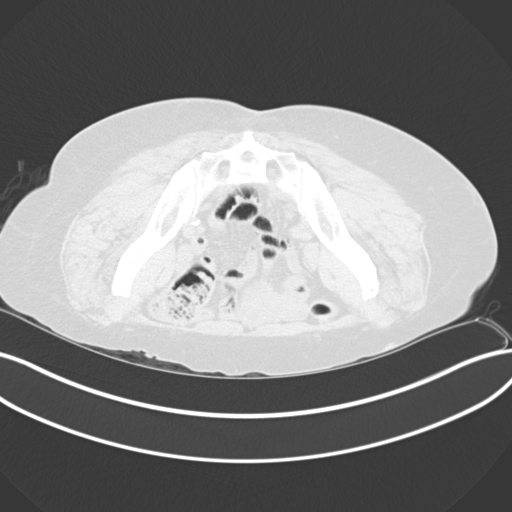
[im 13/33  lung]
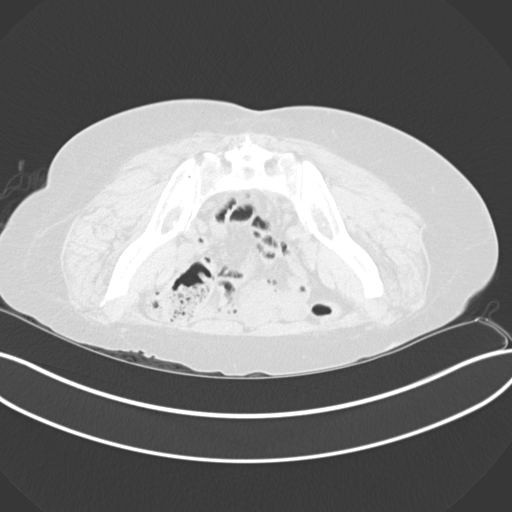
[im 15/33  lung]
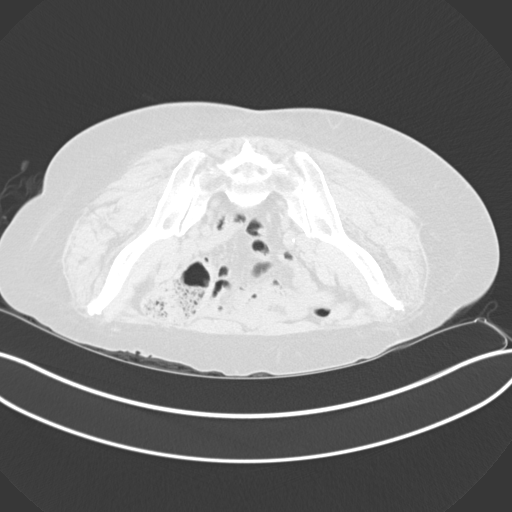
[im 17/33  lung]
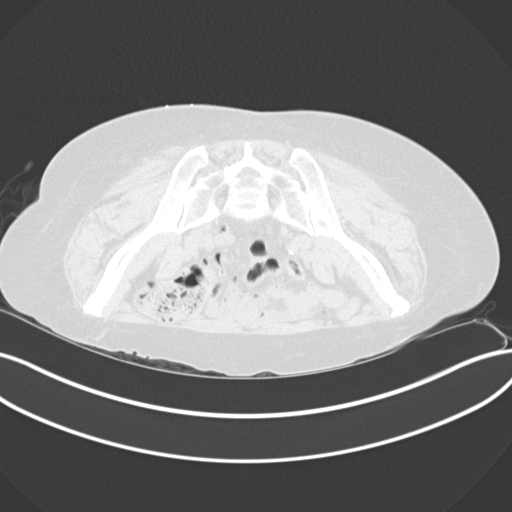
[im 19/33  mediastinal]
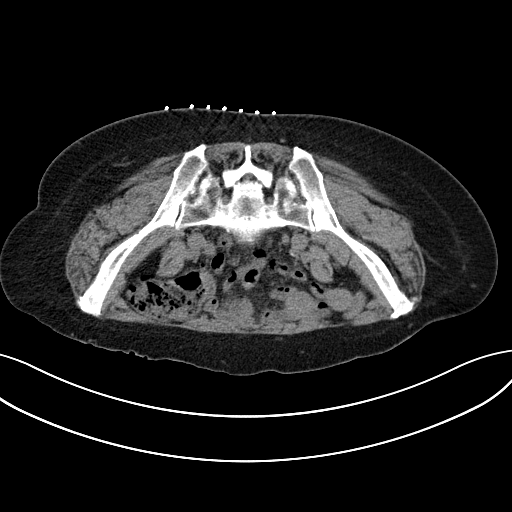
[im 19/33  lung]
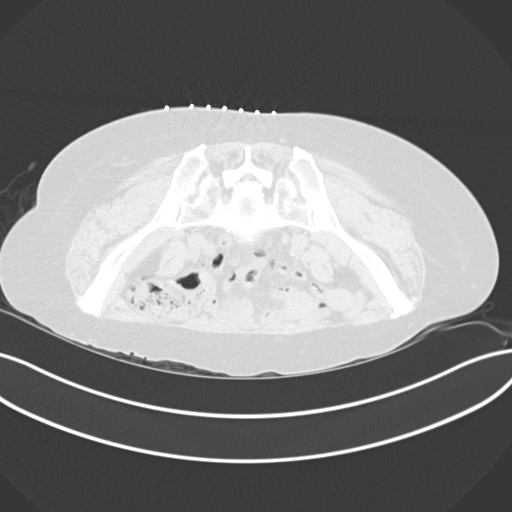
[im 21/33  lung]
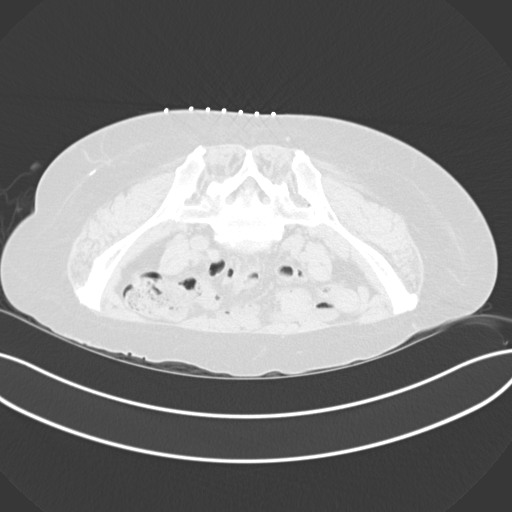
[im 23/33  lung]
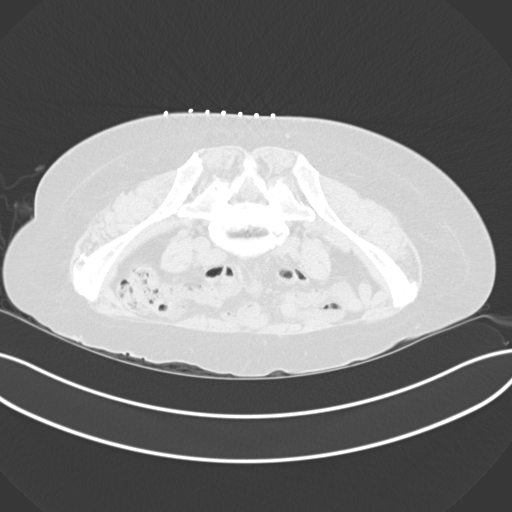
[im 25/33  lung]
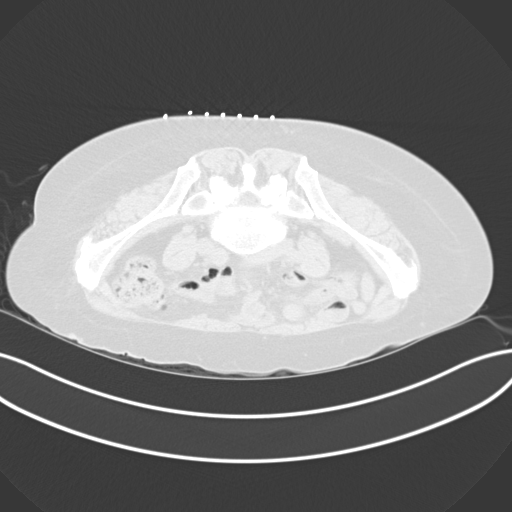
[im 27/33  mediastinal]
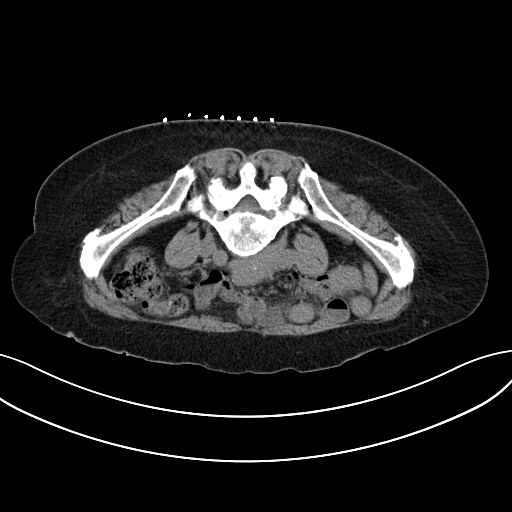
[im 27/33  lung]
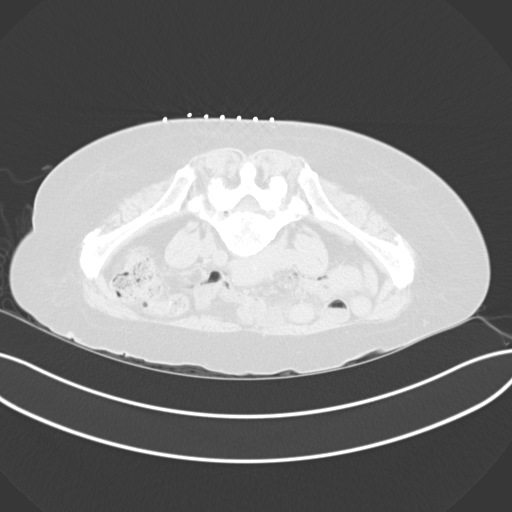
[im 28/33  lung]
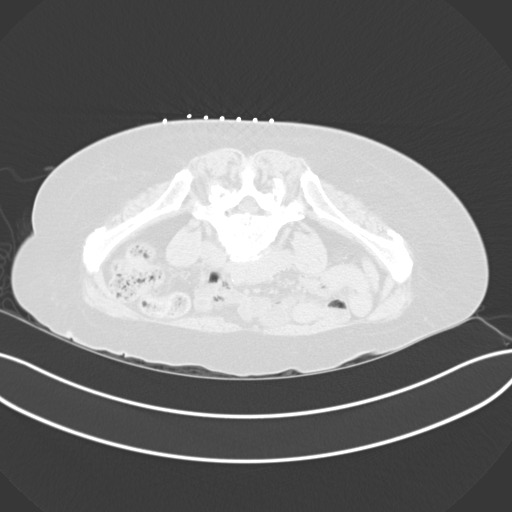
[im 31/33  lung]
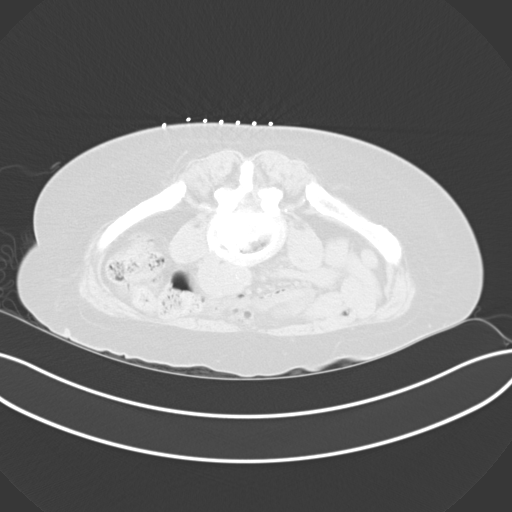

[15 of 30 positions shown; findings below may reference images not displayed]

EXAM:
CT GUIDED BONE MARROW ASPIRATES AND BIOPSY

MEDICATIONS:
None.

ANESTHESIA/SEDATION:
Moderate (conscious) sedation was employed during this procedure. A
total of Versed 2.0mg and fentanyl 100 mcg was administered
intravenously at the order of the provider performing the procedure.

Total intra-service moderate sedation time: 12 minutes.

Patient's level of consciousness and vital signs were monitored
continuously by radiology nurse throughout the procedure under the
supervision of the provider performing the procedure.

COMPLICATIONS:
None immediate.

PROCEDURE:
The procedure was explained to the patient. The risks and benefits
of the procedure were discussed and the patient's questions were
addressed. Informed consent was obtained from the patient. The
patient was placed prone on CT table. Images of the pelvis were
obtained. The right side of back was prepped and draped in sterile
fashion. The skin and right posterior ilium were anesthetized with
1% lidocaine. 11 gauge bone needle was directed into the right ilium
with CT guidance. Two aspirates and one core biopsy were obtained.
Bandage placed over the puncture site.
IMPRESSION: CT guided bone marrow aspiration and core biopsy.

## 2023-06-26 IMAGING — CT CT BIOPSY AND ASPIRATION BONE MARROW
1 of 2 series · 15 of 30 positions shown, 19 images · non-contrast
Comparison: none

INDICATION: 86-year-old with profound neutropenia and marked leukocytosis.
Evaluate for leukemia.

[Series 2: i-spiral 5.0 br40 · axial · 0.77mm/px · z∈[-500,-398]mm · 15 of 33 slices shown, 19 images]
[im 2/33  mediastinal]
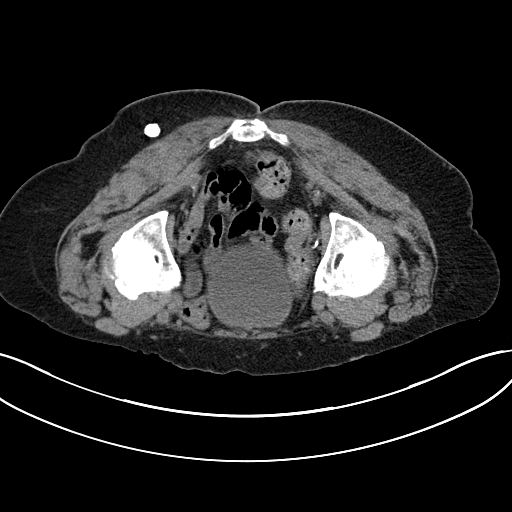
[im 2/33  lung]
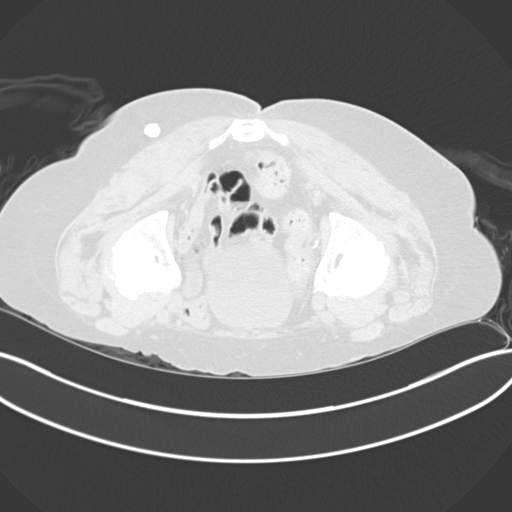
[im 5/33  lung]
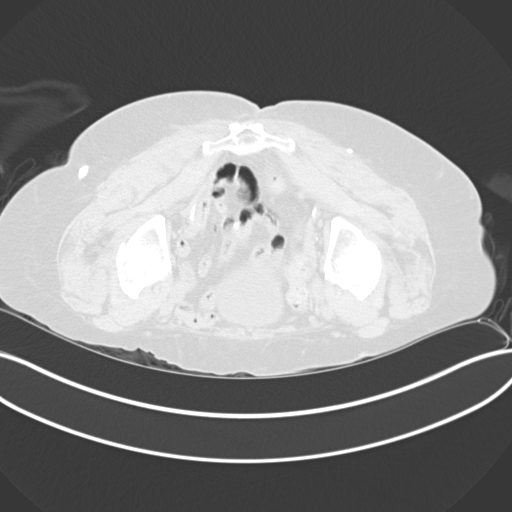
[im 7/33  lung]
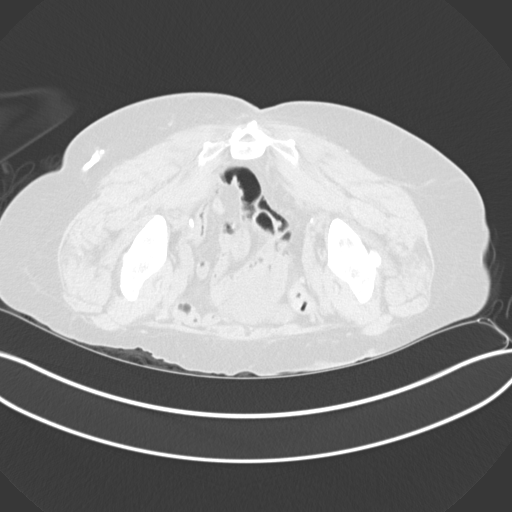
[im 9/33  lung]
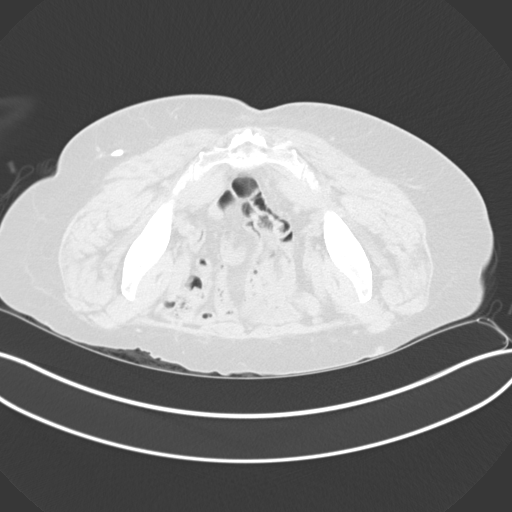
[im 12/33  mediastinal]
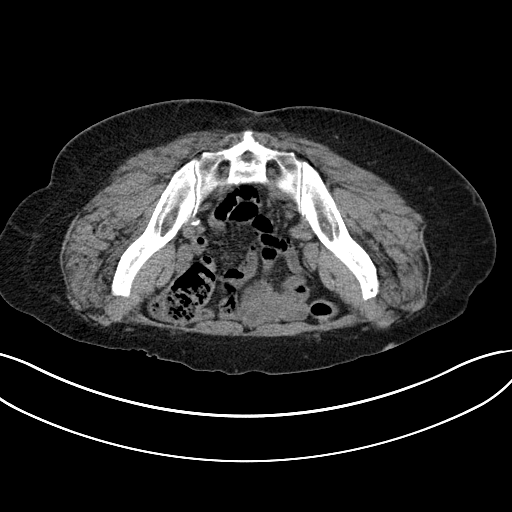
[im 12/33  lung]
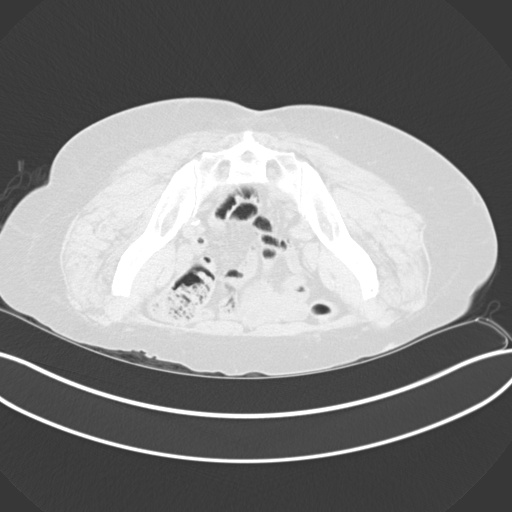
[im 13/33  lung]
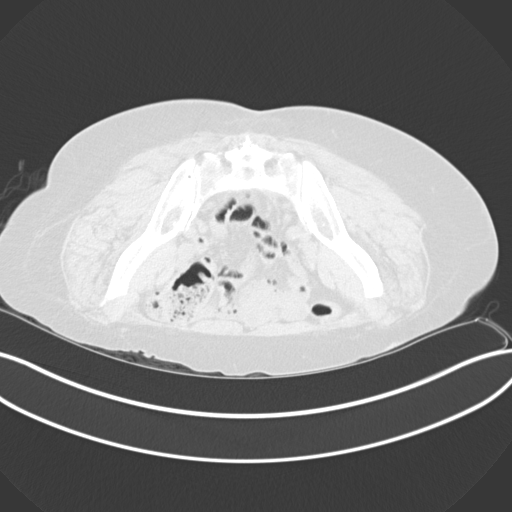
[im 15/33  lung]
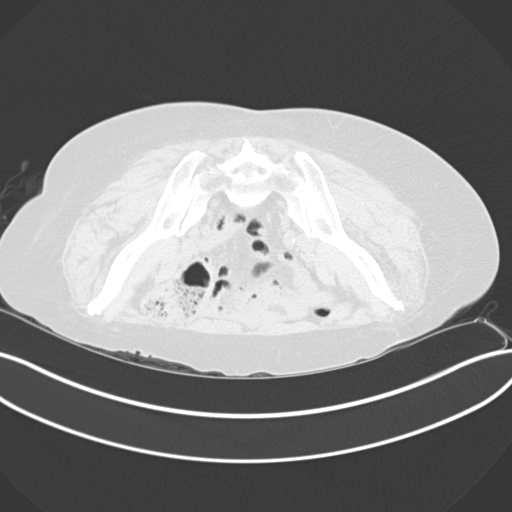
[im 17/33  lung]
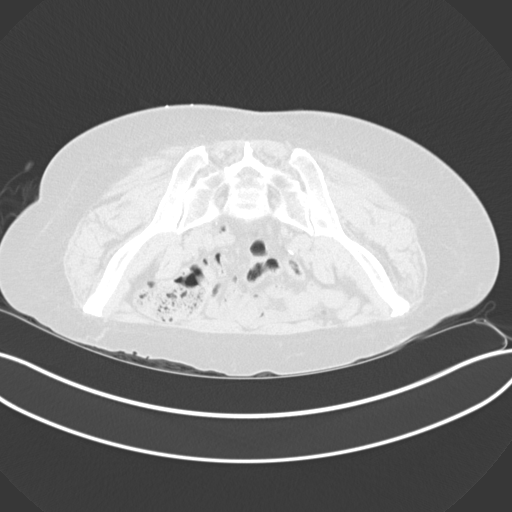
[im 19/33  mediastinal]
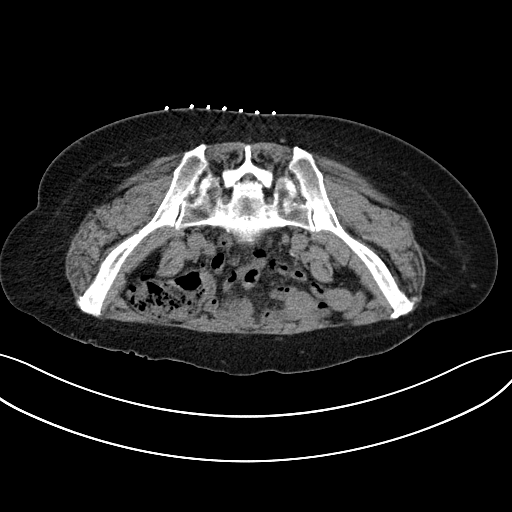
[im 19/33  lung]
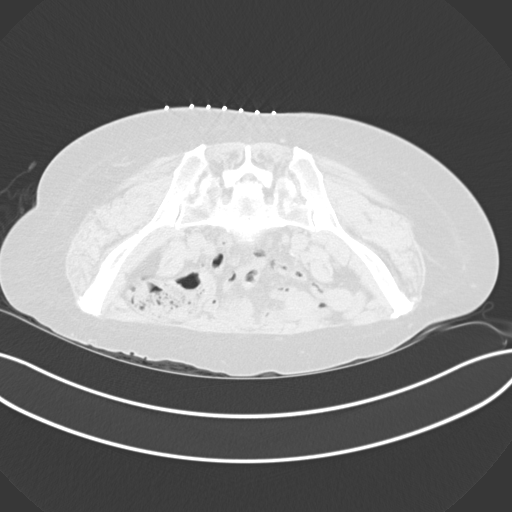
[im 21/33  lung]
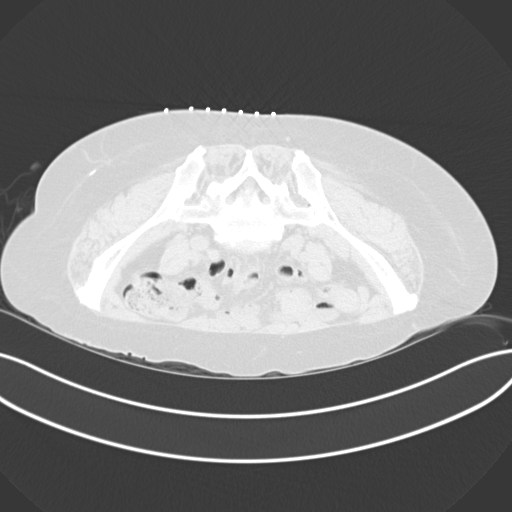
[im 23/33  lung]
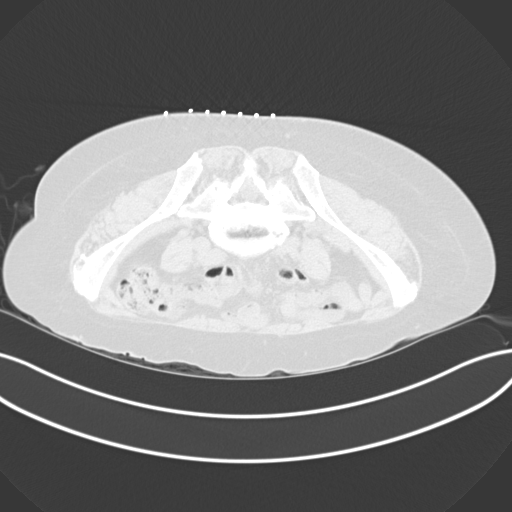
[im 25/33  lung]
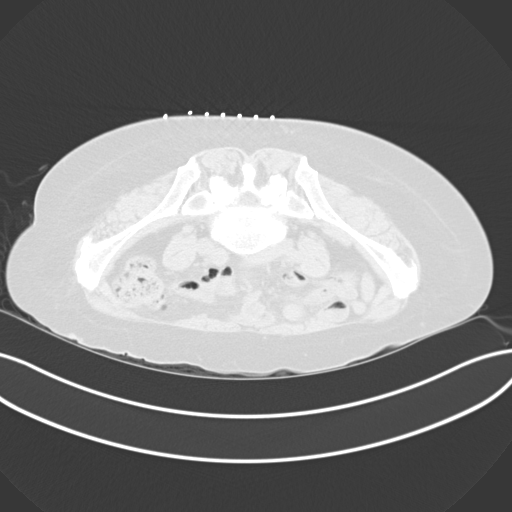
[im 27/33  mediastinal]
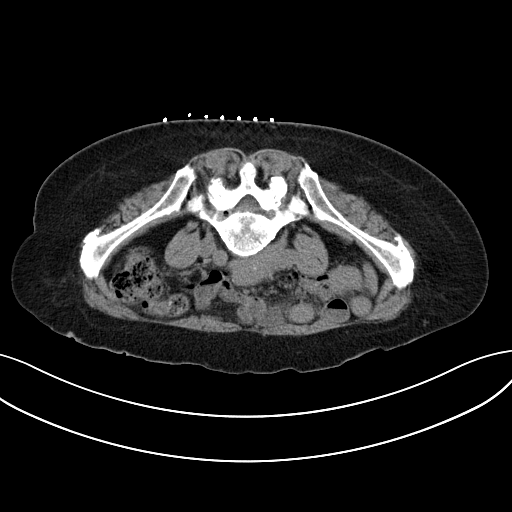
[im 27/33  lung]
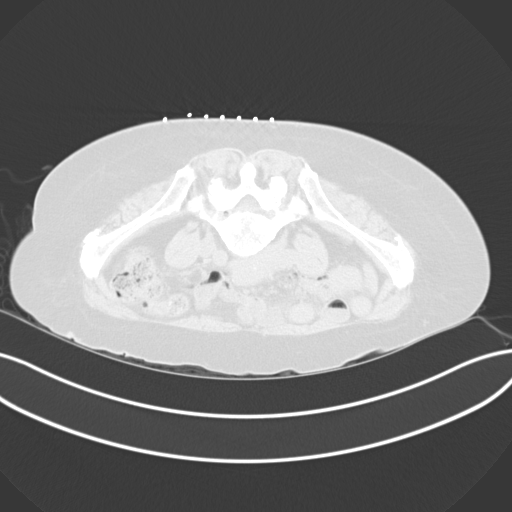
[im 28/33  lung]
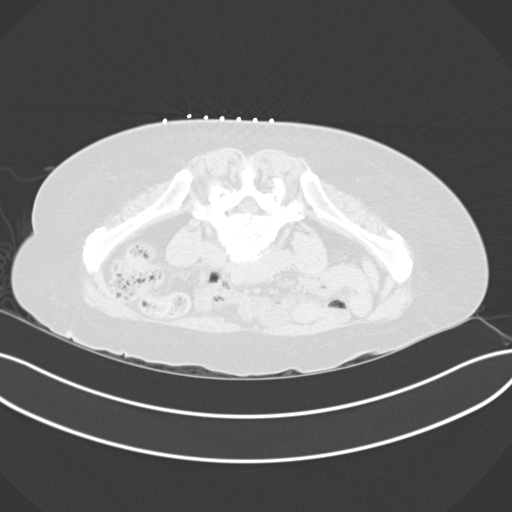
[im 31/33  lung]
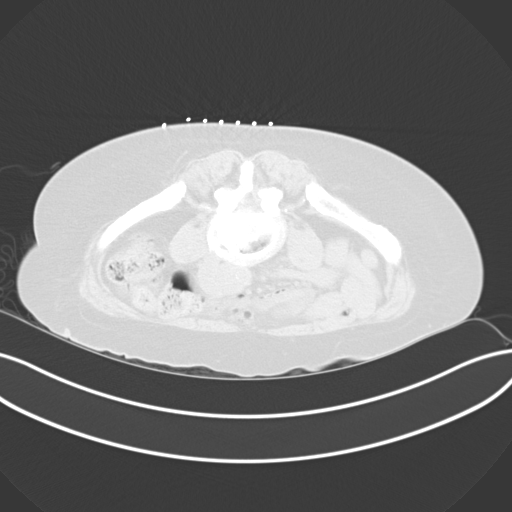

[15 of 30 positions shown; findings below may reference images not displayed]

EXAM:
CT GUIDED BONE MARROW ASPIRATES AND BIOPSY

MEDICATIONS:
None.

ANESTHESIA/SEDATION:
Moderate (conscious) sedation was employed during this procedure. A
total of Versed 2.0mg and fentanyl 100 mcg was administered
intravenously at the order of the provider performing the procedure.

Total intra-service moderate sedation time: 12 minutes.

Patient's level of consciousness and vital signs were monitored
continuously by radiology nurse throughout the procedure under the
supervision of the provider performing the procedure.

COMPLICATIONS:
None immediate.

PROCEDURE:
The procedure was explained to the patient. The risks and benefits
of the procedure were discussed and the patient's questions were
addressed. Informed consent was obtained from the patient. The
patient was placed prone on CT table. Images of the pelvis were
obtained. The right side of back was prepped and draped in sterile
fashion. The skin and right posterior ilium were anesthetized with
1% lidocaine. 11 gauge bone needle was directed into the right ilium
with CT guidance. Two aspirates and one core biopsy were obtained.
Bandage placed over the puncture site.
IMPRESSION: CT guided bone marrow aspiration and core biopsy.

## 2023-07-06 ENCOUNTER — Ambulatory Visit: Payer: Medicare HMO | Admitting: Family Medicine

## 2023-07-14 ENCOUNTER — Other Ambulatory Visit: Payer: Self-pay | Admitting: Family Medicine

## 2023-07-14 DIAGNOSIS — N1832 Chronic kidney disease, stage 3b: Secondary | ICD-10-CM

## 2023-07-14 DIAGNOSIS — R7303 Prediabetes: Secondary | ICD-10-CM

## 2023-07-27 ENCOUNTER — Inpatient Hospital Stay (HOSPITAL_BASED_OUTPATIENT_CLINIC_OR_DEPARTMENT_OTHER): Payer: Medicare HMO | Admitting: Hematology & Oncology

## 2023-07-27 ENCOUNTER — Other Ambulatory Visit: Payer: Self-pay

## 2023-07-27 ENCOUNTER — Encounter: Payer: Self-pay | Admitting: Hematology & Oncology

## 2023-07-27 ENCOUNTER — Inpatient Hospital Stay: Payer: Medicare HMO | Attending: Hematology & Oncology

## 2023-07-27 VITALS — BP 156/43 | HR 92 | Temp 98.1°F | Resp 16 | Ht 61.5 in | Wt 130.0 lb

## 2023-07-27 DIAGNOSIS — C91Z0 Other lymphoid leukemia not having achieved remission: Secondary | ICD-10-CM | POA: Diagnosis not present

## 2023-07-27 DIAGNOSIS — Z79899 Other long term (current) drug therapy: Secondary | ICD-10-CM | POA: Insufficient documentation

## 2023-07-27 DIAGNOSIS — M069 Rheumatoid arthritis, unspecified: Secondary | ICD-10-CM | POA: Diagnosis not present

## 2023-07-27 LAB — CBC WITH DIFFERENTIAL (CANCER CENTER ONLY)
Abs Immature Granulocytes: 0 10*3/uL (ref 0.00–0.07)
Basophils Absolute: 0.1 10*3/uL (ref 0.0–0.1)
Basophils Relative: 1 %
Eosinophils Absolute: 0 10*3/uL (ref 0.0–0.5)
Eosinophils Relative: 0 %
HCT: 35.5 % — ABNORMAL LOW (ref 36.0–46.0)
Hemoglobin: 11.2 g/dL — ABNORMAL LOW (ref 12.0–15.0)
Immature Granulocytes: 0 %
Lymphocytes Relative: 83 %
Lymphs Abs: 11 10*3/uL — ABNORMAL HIGH (ref 0.7–4.0)
MCH: 26.4 pg (ref 26.0–34.0)
MCHC: 31.5 g/dL (ref 30.0–36.0)
MCV: 83.7 fL (ref 80.0–100.0)
Monocytes Absolute: 1.2 10*3/uL — ABNORMAL HIGH (ref 0.1–1.0)
Monocytes Relative: 9 %
Neutro Abs: 0.9 10*3/uL — ABNORMAL LOW (ref 1.7–7.7)
Neutrophils Relative %: 7 %
Platelet Count: 195 10*3/uL (ref 150–400)
RBC: 4.24 MIL/uL (ref 3.87–5.11)
RDW: 17.2 % — ABNORMAL HIGH (ref 11.5–15.5)
Smear Review: NORMAL
WBC Count: 13.2 10*3/uL — ABNORMAL HIGH (ref 4.0–10.5)
nRBC: 0 % (ref 0.0–0.2)

## 2023-07-27 LAB — SAMPLE TO BLOOD BANK

## 2023-07-27 LAB — CMP (CANCER CENTER ONLY)
ALT: 15 U/L (ref 0–44)
AST: 18 U/L (ref 15–41)
Albumin: 3.9 g/dL (ref 3.5–5.0)
Alkaline Phosphatase: 63 U/L (ref 38–126)
Anion gap: 9 (ref 5–15)
BUN: 33 mg/dL — ABNORMAL HIGH (ref 8–23)
CO2: 23 mmol/L (ref 22–32)
Calcium: 9.1 mg/dL (ref 8.9–10.3)
Chloride: 108 mmol/L (ref 98–111)
Creatinine: 1.46 mg/dL — ABNORMAL HIGH (ref 0.44–1.00)
GFR, Estimated: 35 mL/min — ABNORMAL LOW (ref >60)
Glucose, Bld: 99 mg/dL (ref 70–99)
Potassium: 5.1 mmol/L (ref 3.5–5.1)
Sodium: 140 mmol/L (ref 135–145)
Total Bilirubin: 0.4 mg/dL (ref <1.2)
Total Protein: 7.3 g/dL (ref 6.5–8.1)

## 2023-07-27 NOTE — Progress Notes (Signed)
Hematology and Oncology Follow Up Visit  [name] [name] [id number] 1934-12-29 88 y.o. 07/27/2023   Principle Diagnosis:  Large granular lymphocytic leukemia Rheumatoid arthritis   Current Therapy:        Methotrexate 20 mg p.o. weekly- start on 07/27/2021 - stopped 11/07/2022 d/t pancytopenia  GM-CSF/G-CSF q 2-3 week   Interim History:  [name] [name] is here today with her son for follow-up.  [name] [name] is looking a lot better.  [name] [name] has gained weight.  [name] [name] is eating a little bit better.  [name] [name] is taking the [name] elixir more on schedule.  I am just happy that her weight is going up.  Otherwise, [name] [name] is about the same.  [name] [name] has had no fever.  [name] [name] has had no problem with infections.  There has been no change in bowel or bladder habits.  [name] [name] has had no bleeding.  [name] [name] has had no rashes.  [name] [name] has some chronic leg swelling.  Overall, I would say that her performance status is probably ECOG 2.   Medications:  Allergies as of 07/27/2023       Reactions   Tramadol Nausea Only   Codeine Phosphate Nausea Only        Medication List        Accurate as of July 27, 2023 12:38 PM. If you have any questions, ask your nurse or doctor.          acetaminophen 325 MG tablet Commonly known as: TYLENOL Take 650 mg by mouth as needed for headache.   benazepril 5 MG tablet Commonly known as: LOTENSIN TAKE 1 TABLET DAILY   folic acid 1 MG tablet Commonly known as: FOLVITE Take 1 mg by mouth daily.   Jardiance 10 MG Tabs tablet Generic drug: empagliflozin TAKE 1 TABLET BY MOUTH EVERY DAY BEFORE BREAKFAST   leflunomide 10 MG tablet Commonly known as: ARAVA Take 10 mg by mouth daily.   levothyroxine 88 MCG tablet Commonly known as: SYNTHROID TAKE 1 TABLET BY MOUTH EVERY DAY BEFORE BREAKFAST   megestrol 400 MG/10ML suspension Commonly known as: [name] Take 10 mLs (400 mg total) by mouth daily.   senna-docusate 8.6-50 MG tablet Commonly known as: Senokot-S Take 1 tablet by mouth 2  (two) times daily.        Allergies:  Allergies  Allergen Reactions   Tramadol Nausea Only   Codeine Phosphate Nausea Only    Past Medical History, Surgical history, Social history, and Family History were reviewed and updated.  Review of Systems: Review of Systems  Constitutional: Negative.   HENT: Negative.    Eyes: Negative.   Respiratory: Negative.    Cardiovascular: Negative.   Gastrointestinal: Negative.   Genitourinary: Negative.   Musculoskeletal: Negative.   Skin: Negative.   Neurological: Negative.   Endo/Heme/Allergies: Negative.   Psychiatric/Behavioral: Negative.       Physical Exam: Temperature is 98.1.  Pulse 92.  Blood pressure 156/43.  Weight is 130 pounds.  Wt Readings from Last 3 Encounters:  07/27/23 130 lb (59 kg)  06/18/23 122 lb 1.9 oz (55.4 kg)  05/13/23 114 lb (51.7 kg)   Physical Exam Vitals reviewed.  HENT:     Head: Normocephalic and atraumatic.  Eyes:     Pupils: Pupils are equal, round, and reactive to light.  Cardiovascular:     Rate and Rhythm: Normal rate and regular rhythm.     Heart sounds: Normal heart sounds.  Pulmonary:     Effort: Pulmonary effort is normal.  Breath sounds: Normal breath sounds.  Abdominal:     General: Bowel sounds are normal.     Palpations: Abdomen is soft.  Musculoskeletal:        General: No tenderness or deformity. Normal range of motion.     Cervical back: Normal range of motion.  Lymphadenopathy:     Cervical: No cervical adenopathy.  Skin:    General: Skin is warm and dry.     Findings: No erythema or rash.  Neurological:     Mental Status: [name] [name] is alert and oriented to person, place, and time.  Psychiatric:        Behavior: Behavior normal.        Thought Content: Thought content normal.        Judgment: Judgment normal.     Lab Results  Component Value Date   WBC 13.2 (H) 07/27/2023   HGB 11.2 (L) 07/27/2023   HCT 35.5 (L) 07/27/2023   MCV 83.7 07/27/2023   PLT 195  07/27/2023   Lab Results  Component Value Date   FERRITIN 266 09/25/2022   IRON 28 09/25/2022   TIBC 193 (L) 09/25/2022   UIBC 165 09/25/2022   IRONPCTSAT 15 09/25/2022   Lab Results  Component Value Date   RETICCTPCT 1.6 09/25/2022   RBC 4.24 07/27/2023   No results found for: "KPAFRELGTCHN", "LAMBDASER", "KAPLAMBRATIO" Lab Results  Component Value Date   IGGSERUM 1,266 07/15/2021   IGA 424 (H) 07/15/2021   IGMSERUM 194 07/15/2021   No results found for: "TOTALPROTELP", "ALBUMINELP", "A1GS", "A2GS", "BETS", "BETA2SER", "GAMS", "MSPIKE", "SPEI"   Chemistry      Component Value Date/Time   NA 140 07/27/2023 1156   NA 141 02/03/2019 1129   K 5.1 07/27/2023 1156   CL 108 07/27/2023 1156   CO2 23 07/27/2023 1156   BUN 33 (H) 07/27/2023 1156   BUN 31 (H) 02/03/2019 1129   CREATININE 1.46 (H) 07/27/2023 1156   CREATININE 1.00 (H) 05/20/2021 1545      Component Value Date/Time   CALCIUM 9.1 07/27/2023 1156   ALKPHOS 63 07/27/2023 1156   AST 18 07/27/2023 1156   ALT 15 07/27/2023 1156   BILITOT 0.4 07/27/2023 1156       Impression and Plan: [name] [name] is a very pleasant 88 yo caucasian female with history of both Large granular lymphocytic leukemia and RA.   Thankfully, the neutropenia is a little bit better today.  I do not think that [name] [name] needs any G-CSF.  We will try to get her through the Holiday season.  Hopefully, the weight gain is helping her immune system.  For right now, we will plan to get her back in another 6 weeks or so.   Josph Macho, MD 12/23/202412:38 PM

## 2023-08-11 ENCOUNTER — Other Ambulatory Visit: Payer: Self-pay | Admitting: Family Medicine

## 2023-08-11 DIAGNOSIS — R7303 Prediabetes: Secondary | ICD-10-CM

## 2023-08-11 DIAGNOSIS — N1832 Chronic kidney disease, stage 3b: Secondary | ICD-10-CM

## 2023-08-14 ENCOUNTER — Telehealth: Payer: Self-pay

## 2023-08-14 ENCOUNTER — Ambulatory Visit (INDEPENDENT_AMBULATORY_CARE_PROVIDER_SITE_OTHER): Payer: Medicare HMO | Admitting: Family Medicine

## 2023-08-14 ENCOUNTER — Encounter: Payer: Self-pay | Admitting: Family Medicine

## 2023-08-14 VITALS — BP 138/78 | HR 66 | Temp 98.1°F | Ht 61.0 in | Wt 135.2 lb

## 2023-08-14 DIAGNOSIS — N1832 Chronic kidney disease, stage 3b: Secondary | ICD-10-CM | POA: Diagnosis not present

## 2023-08-14 DIAGNOSIS — E039 Hypothyroidism, unspecified: Secondary | ICD-10-CM | POA: Diagnosis not present

## 2023-08-14 DIAGNOSIS — R7303 Prediabetes: Secondary | ICD-10-CM

## 2023-08-14 LAB — TSH: TSH: 8.61 u[IU]/mL — ABNORMAL HIGH (ref 0.35–5.50)

## 2023-08-14 LAB — HEMOGLOBIN A1C: Hgb A1c MFr Bld: 6.1 % (ref 4.6–6.5)

## 2023-08-14 NOTE — Patient Outreach (Signed)
  Care Coordination   In Person Provider Office Visit Note   08/14/2023 Name: [name] [name] MRN: [id number] DOB: July 22, 1935  [name] [name] is a 89 y.o. year old female who sees [name] Elsie Sayre, MD for primary care. I engaged with [name] [name] in the providers office today. Son [name] [name] in attendance as well.  What matters to the patients health and wellness today?  Maintaining health and functional status    Goals Addressed             This Visit's Progress    COMPLETED: Care Coordination Activities-No follow up required       Care Coordination Interventions: Advised patient to Annual Wellness exam. Discussed services and support. Assessed SDOH. Advised to discuss with primary care physician if services needed in the future.         SDOH assessments and interventions completed:  Yes  SDOH Interventions Today    Flowsheet Row Most Recent Value  SDOH Interventions   Food Insecurity Interventions Intervention Not Indicated  Housing Interventions Intervention Not Indicated  Transportation Interventions Intervention Not Indicated  Health Literacy Interventions Other (Comment)  [Son assists and completes health information.]        Care Coordination Interventions:  Yes, provided   Follow up plan: No further intervention required.   Encounter Outcome:  Patient Visit Completed

## 2023-08-14 NOTE — Progress Notes (Signed)
 Established Patient Office Visit   Subjective:  Patient ID: [name] [name], female    DOB: Jun 26, 1935  Age: 89 y.o. MRN: [id number]  Chief Complaint  Patient presents with   Medical Management of Chronic Issues    Follow up. Pt is fasting.     HPI Encounter Diagnoses  Name Primary?   Prediabetes Yes   Acquired hypothyroidism    Stage 3b chronic kidney disease (HCC)    Presents for follow-up of above with her [name].  Continues to live independently.  There are frequent family checks.  She has a pill dispenser with alarm on it.  She has been compliant with her medications including her Jardiance .  She has not been drinking as much water  as she usually does.  Review of labs shows a following GFR over the last 3 months.  Now into CKD 3B range.   Review of Systems  Constitutional: Negative.   HENT: Negative.    Eyes:  Negative for blurred vision, discharge and redness.  Respiratory: Negative.    Cardiovascular: Negative.   Gastrointestinal:  Negative for abdominal pain.  Genitourinary: Negative.   Musculoskeletal: Negative.  Negative for myalgias.  Skin:  Negative for rash.  Neurological:  Negative for tingling, loss of consciousness and weakness.  Endo/Heme/Allergies:  Negative for polydipsia.     Current Outpatient Medications:    acetaminophen  (TYLENOL ) 325 MG tablet, Take 650 mg by mouth as needed for headache., Disp: , Rfl:    benazepril  (LOTENSIN ) 5 MG tablet, TAKE 1 TABLET DAILY, Disp: 90 tablet, Rfl: 3   folic acid  (FOLVITE ) 1 MG tablet, Take 1 mg by mouth daily., Disp: , Rfl:    JARDIANCE  10 MG TABS tablet, TAKE 1 TABLET BY MOUTH EVERY DAY BEFORE BREAKFAST, Disp: 30 tablet, Rfl: 0   leflunomide  (ARAVA ) 10 MG tablet, Take 10 mg by mouth daily., Disp: , Rfl:    levothyroxine  (SYNTHROID ) 88 MCG tablet, TAKE 1 TABLET BY MOUTH EVERY DAY BEFORE BREAKFAST, Disp: 90 tablet, Rfl: 1   megestrol  ([name] ) 400 MG/10ML suspension, Take 10 mLs (400 mg total) by mouth daily.,  Disp: 488 mL, Rfl: 3   senna-docusate (SENOKOT-S) 8.6-50 MG tablet, Take 1 tablet by mouth 2 (two) times daily., Disp: 30 tablet, Rfl: 0   Objective:     BP 138/78 (Cuff Size: Small)   Pulse 66   Temp 98.1 F (36.7 C)   Ht 5' 1 (1.549 m)   Wt 135 lb 3.2 oz (61.3 kg)   SpO2 100%   BMI 25.55 kg/m  BP Readings from Last 3 Encounters:  08/14/23 138/78  07/27/23 (!) 156/43  06/18/23 (!) 133/40   Wt Readings from Last 3 Encounters:  08/14/23 135 lb 3.2 oz (61.3 kg)  07/27/23 130 lb (59 kg)  06/18/23 122 lb 1.9 oz (55.4 kg)      Physical Exam Constitutional:      General: She is not in acute distress.    Appearance: Normal appearance. She is not ill-appearing, toxic-appearing or diaphoretic.  HENT:     Head: Normocephalic and atraumatic.     Right Ear: External ear normal.     Left Ear: External ear normal.  Eyes:     General: No scleral icterus.       Right eye: No discharge.        Left eye: No discharge.     Extraocular Movements: Extraocular movements intact.     Conjunctiva/sclera: Conjunctivae normal.  Pulmonary:     Effort:  Pulmonary effort is normal. No respiratory distress.  Skin:    General: Skin is warm and dry.  Neurological:     Mental Status: She is alert and oriented to person, place, and time.  Psychiatric:        Mood and Affect: Mood normal.        Behavior: Behavior normal.      No results found for any visits on 08/14/23.    The ASCVD Risk score (Arnett DK, et al., 2019) failed to calculate for the following reasons:   The 2019 ASCVD risk score is only valid for ages 81 to 70    Assessment & Plan:   Prediabetes -     Hemoglobin A1c  Acquired hypothyroidism -     TSH  Stage 3b chronic kidney disease (HCC) -     Ambulatory referral to Nephrology    Return in about 3 months (around 11/12/2023), or Please drink lots of water ..  Adjustments made to medications pending results of labs.   Elsie Sim Lent, MD

## 2023-08-14 NOTE — Patient Instructions (Signed)
 Visit Information  Thank you for taking time to visit with me today. Please don't hesitate to contact me if I can be of assistance to you.   Following are the goals we discussed today:   Goals Addressed             This Visit's Progress    COMPLETED: Care Coordination Activities-No follow up required       Care Coordination Interventions: Advised patient to Annual Wellness exam. Discussed services and support. Assessed SDOH. Advised to discuss with primary care physician if services needed in the future.         If you are experiencing a Mental Health or Behavioral Health Crisis or need someone to talk to, please call the Suicide and Crisis Lifeline: 988   The patient verbalized understanding of instructions, educational materials, and care plan provided today and DECLINED offer to receive copy of patient instructions, educational materials, and care plan.   The patient has been provided with contact information for the care management team and has been advised to call with any health related questions or concerns.   Yvetta Drotar J Ezri Landers, RN, MSN RN Care Manager [facility], Population [facility] Direct Dial: [phone]  Fax: [phone] Website: [email or web address]

## 2023-08-20 ENCOUNTER — Other Ambulatory Visit: Payer: Self-pay | Admitting: Physician Assistant

## 2023-08-20 NOTE — Telephone Encounter (Signed)
Last Fill: 08/13/2022  Labs: 07/27/2023 Creat. 1.46, GFR 35, BUN 33  Next Visit: 10/15/2023  Last Visit: 05/05/2023  DX: Rheumatoid arthritis involving multiple sites with positive rheumatoid factor   Current Dose per office note 05/05/2023: Arava 10 mg 1 tablet daily for rheumatoid arthritis.   Okay to refill Arava ?

## 2023-08-26 ENCOUNTER — Other Ambulatory Visit: Payer: Self-pay | Admitting: Family Medicine

## 2023-08-26 DIAGNOSIS — N1832 Chronic kidney disease, stage 3b: Secondary | ICD-10-CM

## 2023-08-26 DIAGNOSIS — I10 Essential (primary) hypertension: Secondary | ICD-10-CM

## 2023-09-08 ENCOUNTER — Inpatient Hospital Stay: Payer: Medicare HMO | Attending: Hematology & Oncology

## 2023-09-08 ENCOUNTER — Inpatient Hospital Stay (HOSPITAL_BASED_OUTPATIENT_CLINIC_OR_DEPARTMENT_OTHER): Payer: Medicare HMO | Admitting: Hematology & Oncology

## 2023-09-08 ENCOUNTER — Encounter: Payer: Self-pay | Admitting: Hematology & Oncology

## 2023-09-08 ENCOUNTER — Other Ambulatory Visit: Payer: Self-pay

## 2023-09-08 ENCOUNTER — Inpatient Hospital Stay: Payer: Medicare HMO

## 2023-09-08 VITALS — BP 139/44 | HR 85 | Temp 98.2°F | Resp 16 | Ht 61.5 in | Wt 133.0 lb

## 2023-09-08 DIAGNOSIS — M069 Rheumatoid arthritis, unspecified: Secondary | ICD-10-CM | POA: Insufficient documentation

## 2023-09-08 DIAGNOSIS — M0579 Rheumatoid arthritis with rheumatoid factor of multiple sites without organ or systems involvement: Secondary | ICD-10-CM | POA: Diagnosis not present

## 2023-09-08 DIAGNOSIS — C91Z0 Other lymphoid leukemia not having achieved remission: Secondary | ICD-10-CM | POA: Diagnosis not present

## 2023-09-08 DIAGNOSIS — D709 Neutropenia, unspecified: Secondary | ICD-10-CM | POA: Diagnosis not present

## 2023-09-08 LAB — CBC WITH DIFFERENTIAL (CANCER CENTER ONLY)
Abs Immature Granulocytes: 0.01 10*3/uL (ref 0.00–0.07)
Basophils Absolute: 0.1 10*3/uL (ref 0.0–0.1)
Basophils Relative: 0 %
Eosinophils Absolute: 0.1 10*3/uL (ref 0.0–0.5)
Eosinophils Relative: 1 %
HCT: 37.3 % (ref 36.0–46.0)
Hemoglobin: 11.5 g/dL — ABNORMAL LOW (ref 12.0–15.0)
Immature Granulocytes: 0 %
Lymphocytes Relative: 84 %
Lymphs Abs: 9.5 10*3/uL — ABNORMAL HIGH (ref 0.7–4.0)
MCH: 26.7 pg (ref 26.0–34.0)
MCHC: 30.8 g/dL (ref 30.0–36.0)
MCV: 86.7 fL (ref 80.0–100.0)
Monocytes Absolute: 1 10*3/uL (ref 0.1–1.0)
Monocytes Relative: 9 %
Neutro Abs: 0.7 10*3/uL — ABNORMAL LOW (ref 1.7–7.7)
Neutrophils Relative %: 6 %
Platelet Count: 184 10*3/uL (ref 150–400)
RBC: 4.3 MIL/uL (ref 3.87–5.11)
RDW: 15.6 % — ABNORMAL HIGH (ref 11.5–15.5)
Smear Review: NORMAL
WBC Count: 11.3 10*3/uL — ABNORMAL HIGH (ref 4.0–10.5)
nRBC: 0 % (ref 0.0–0.2)

## 2023-09-08 LAB — CMP (CANCER CENTER ONLY)
ALT: 9 U/L (ref 0–44)
AST: 20 U/L (ref 15–41)
Albumin: 4.1 g/dL (ref 3.5–5.0)
Alkaline Phosphatase: 64 U/L (ref 38–126)
Anion gap: 11 (ref 5–15)
BUN: 30 mg/dL — ABNORMAL HIGH (ref 8–23)
CO2: 20 mmol/L — ABNORMAL LOW (ref 22–32)
Calcium: 8.9 mg/dL (ref 8.9–10.3)
Chloride: 107 mmol/L (ref 98–111)
Creatinine: 1.6 mg/dL — ABNORMAL HIGH (ref 0.44–1.00)
GFR, Estimated: 31 mL/min — ABNORMAL LOW (ref >60)
Glucose, Bld: 75 mg/dL (ref 70–99)
Potassium: 4.8 mmol/L (ref 3.5–5.1)
Sodium: 138 mmol/L (ref 135–145)
Total Bilirubin: 0.5 mg/dL (ref 0.0–1.2)
Total Protein: 7.9 g/dL (ref 6.5–8.1)

## 2023-09-08 LAB — SAVE SMEAR(SSMR), FOR PROVIDER SLIDE REVIEW

## 2023-09-08 LAB — LACTATE DEHYDROGENASE: LDH: 236 U/L — ABNORMAL HIGH (ref 98–192)

## 2023-09-08 MED ORDER — SODIUM CHLORIDE 0.9 % IV SOLN: Freq: Once | INTRAVENOUS | Status: AC

## 2023-09-08 NOTE — Patient Instructions (Signed)

## 2023-09-08 NOTE — Progress Notes (Signed)
 Hematology and Oncology Follow Up Visit  [name] [name] [id number] February 15, 1935 89 y.o. 09/08/2023   Principle Diagnosis:  Large granular lymphocytic leukemia Rheumatoid arthritis   Current Therapy:        Methotrexate  20 mg p.o. weekly- start on 07/27/2021 - stopped 11/07/2022 d/t pancytopenia  GM-CSF/G-CSF q 2-3 week   Interim History:  [name] [name] is here today with her son for follow-up.  The main problem right now is that she is not taking a lot of liquids.  Again, I does have to wonder if this is not somehow related to her overall state of mind and her decreasing cognition.  She says that she just does not get thirsty.  Her renal function is slowly deteriorating.  I showed her the results.  Again I am not sure how much she really comprehends of this.  I know that her son comprehends it very well.  She is going need to have some IV fluid.  She still markedly neutropenic.  However, she has been totally asymptomatic with this.  She lives by herself.  I know that her son is trying his best to try to help her out.  It certainly seems as if she may need to go into some kind of assisted living at some point.  She has had no problems with cough or shortness of breath.  She is eating a little bit.  I am not sure if she is really taken the [name]  elixir.  She has had no problems with nausea.  Overall, I would say that her performance status is probably ECOG 3.  Medications:  Allergies as of 09/08/2023       Reactions   Tramadol  Nausea Only   Codeine Phosphate Nausea Only        Medication List        Accurate as of September 08, 2023  1:20 PM. If you have any questions, ask your nurse or doctor.          acetaminophen  325 MG tablet Commonly known as: TYLENOL  Take 650 mg by mouth as needed for headache.   benazepril  5 MG tablet Commonly known as: LOTENSIN  TAKE 2 TABLETS (10MG  TOTAL) BY MOUTH EVERY DAY   folic acid  1 MG tablet Commonly known as: FOLVITE  Take 1 mg by  mouth daily.   Jardiance  10 MG Tabs tablet Generic drug: empagliflozin  TAKE 1 TABLET BY MOUTH EVERY DAY BEFORE BREAKFAST   leflunomide  10 MG tablet Commonly known as: ARAVA  TAKE 1 TABLET BY MOUTH EVERY DAY   levothyroxine  88 MCG tablet Commonly known as: SYNTHROID  TAKE 1 TABLET BY MOUTH EVERY DAY BEFORE BREAKFAST   megestrol  400 MG/10ML suspension Commonly known as: [name]  Take 10 mLs (400 mg total) by mouth daily.   senna-docusate 8.6-50 MG tablet Commonly known as: Senokot-S Take 1 tablet by mouth 2 (two) times daily.        Allergies:  Allergies  Allergen Reactions   Tramadol  Nausea Only   Codeine Phosphate Nausea Only    Past Medical History, Surgical history, Social history, and Family History were reviewed and updated.  Review of Systems: Review of Systems  Constitutional: Negative.   HENT: Negative.    Eyes: Negative.   Respiratory: Negative.    Cardiovascular: Negative.   Gastrointestinal: Negative.   Genitourinary: Negative.   Musculoskeletal: Negative.   Skin: Negative.   Neurological: Negative.   Endo/Heme/Allergies: Negative.   Psychiatric/Behavioral: Negative.       Physical Exam: Temperature is 98.2.  Pulse 85.  Blood pressure 139/44.  Weight is 133 pounds.   Wt Readings from Last 3 Encounters:  09/08/23 133 lb (60.3 kg)  08/14/23 135 lb 3.2 oz (61.3 kg)  07/27/23 130 lb (59 kg)   Physical Exam Vitals reviewed.  HENT:     Head: Normocephalic and atraumatic.  Eyes:     Pupils: Pupils are equal, round, and reactive to light.  Cardiovascular:     Rate and Rhythm: Normal rate and regular rhythm.     Heart sounds: Normal heart sounds.  Pulmonary:     Effort: Pulmonary effort is normal.     Breath sounds: Normal breath sounds.  Abdominal:     General: Bowel sounds are normal.     Palpations: Abdomen is soft.  Musculoskeletal:        General: No tenderness or deformity. Normal range of motion.     Cervical back: Normal range of  motion.  Lymphadenopathy:     Cervical: No cervical adenopathy.  Skin:    General: Skin is warm and dry.     Findings: No erythema or rash.  Neurological:     Mental Status: She is alert and oriented to person, place, and time.  Psychiatric:        Behavior: Behavior normal.        Thought Content: Thought content normal.        Judgment: Judgment normal.     Lab Results  Component Value Date   WBC 11.3 (H) 09/08/2023   HGB 11.5 (L) 09/08/2023   HCT 37.3 09/08/2023   MCV 86.7 09/08/2023   PLT 184 09/08/2023   Lab Results  Component Value Date   FERRITIN 266 09/25/2022   IRON 28 09/25/2022   TIBC 193 (L) 09/25/2022   UIBC 165 09/25/2022   IRONPCTSAT 15 09/25/2022   Lab Results  Component Value Date   RETICCTPCT 1.6 09/25/2022   RBC 4.30 09/08/2023   No results found for: JON[facility]AN [facility] Lab Results  Component Value Date   IGGSERUM 1,266 07/15/2021   IGA 424 (H) 07/15/2021   IGMSERUM 194 07/15/2021   No results found for: STEPHANY CARLOTA BENSON MARKEL EARLA JOANNIE DOC VICK, SPEI   Chemistry      Component Value Date/Time   NA 138 09/08/2023 1230   NA 141 02/03/2019 1129   K 4.8 09/08/2023 1230   CL 107 09/08/2023 1230   CO2 20 (L) 09/08/2023 1230   BUN 30 (H) 09/08/2023 1230   BUN 31 (H) 02/03/2019 1129   CREATININE 1.60 (H) 09/08/2023 1230   CREATININE 1.00 (H) 05/20/2021 1545      Component Value Date/Time   CALCIUM  8.9 09/08/2023 1230   ALKPHOS 64 09/08/2023 1230   AST 20 09/08/2023 1230   ALT 9 09/08/2023 1230   BILITOT 0.5 09/08/2023 1230       Impression and Plan: [name] [name] is a very pleasant 89 yo caucasian female with history of both Large granular lymphocytic leukemia and RA.   Is clearly the dehydration that is can be a problem for her.  Again, her renal function is slowly going down.  I told her that she is not a candidate for dialysis.  If her kidneys continue to deteriorate,  then she will certainly not survive.  It would not surprise me if she ends up in some kind of assisted living.  She lives by herself.  Is hard to say how as long as to be able to manage being by herself.  We will give her IV fluids in the office today.  I does want to try to help her as much as possible.  Will have to get her back to see us  in another couple weeks or so.    Maude JONELLE Crease, MD 2/4/20251:20 PM

## 2023-09-09 ENCOUNTER — Other Ambulatory Visit: Payer: Self-pay | Admitting: Family Medicine

## 2023-09-09 DIAGNOSIS — N1832 Chronic kidney disease, stage 3b: Secondary | ICD-10-CM

## 2023-09-09 DIAGNOSIS — R7303 Prediabetes: Secondary | ICD-10-CM

## 2023-09-21 ENCOUNTER — Ambulatory Visit (INDEPENDENT_AMBULATORY_CARE_PROVIDER_SITE_OTHER): Payer: Medicare HMO | Admitting: Family Medicine

## 2023-09-21 ENCOUNTER — Encounter: Payer: Self-pay | Admitting: Family Medicine

## 2023-09-21 VITALS — BP 124/64 | HR 86 | Temp 98.0°F | Ht 61.0 in | Wt 131.0 lb

## 2023-09-21 DIAGNOSIS — R4189 Other symptoms and signs involving cognitive functions and awareness: Secondary | ICD-10-CM | POA: Diagnosis not present

## 2023-09-21 DIAGNOSIS — E039 Hypothyroidism, unspecified: Secondary | ICD-10-CM

## 2023-09-21 LAB — TSH: TSH: 4.98 u[IU]/mL (ref 0.35–5.50)

## 2023-09-21 MED ORDER — DONEPEZIL HCL 5 MG PO TBDP
5.0000 mg | ORAL_TABLET | Freq: Every day | ORAL | 1 refills | Status: DC
Start: 2023-09-21 — End: 2024-03-03

## 2023-09-21 NOTE — Progress Notes (Signed)
 Established Patient Office Visit   Subjective:  Patient ID: [name] [name], female    DOB: 1935/08/03  Age: 89 y.o. MRN: [id number]  Chief Complaint  Patient presents with   Medical Management of Chronic Issues    Recheck TSH.     HPI Encounter Diagnoses  Name Primary?   Acquired hypothyroidism Yes   Cognitive decline    Continues to use automated pill dispenser.  Son is present today for the visit and is certain that it has been working.  Family is as certain as they can be that she has been taking her thyroid medication regularly.  She is taking it with the Jardiance.  Family is concerned about cognitive decline.  She repeats herself often and forgets that she is eating meals.   Review of Systems  Constitutional: Negative.   HENT: Negative.    Eyes:  Negative for blurred vision, discharge and redness.  Respiratory: Negative.    Cardiovascular: Negative.   Gastrointestinal:  Negative for abdominal pain.  Genitourinary: Negative.   Musculoskeletal: Negative.  Negative for myalgias.  Skin:  Negative for rash.  Neurological:  Negative for tingling, loss of consciousness and weakness.  Endo/Heme/Allergies:  Negative for polydipsia.  Psychiatric/Behavioral:  Positive for memory loss. Negative for depression. The patient is not nervous/anxious.      Current Outpatient Medications:    acetaminophen (TYLENOL) 325 MG tablet, Take 650 mg by mouth as needed for headache., Disp: , Rfl:    benazepril (LOTENSIN) 5 MG tablet, TAKE 2 TABLETS (10MG  TOTAL) BY MOUTH EVERY DAY, Disp: 180 tablet, Rfl: 1   donepezil (ARICEPT ODT) 5 MG disintegrating tablet, Take 1 tablet (5 mg total) by mouth at bedtime., Disp: 90 tablet, Rfl: 1   folic acid (FOLVITE) 1 MG tablet, Take 1 mg by mouth daily., Disp: , Rfl:    JARDIANCE 10 MG TABS tablet, TAKE 1 TABLET BY MOUTH EVERY DAY BEFORE BREAKFAST, Disp: 30 tablet, Rfl: 0   leflunomide (ARAVA) 10 MG tablet, TAKE 1 TABLET BY MOUTH EVERY DAY, Disp: 90  tablet, Rfl: 0   levothyroxine (SYNTHROID) 88 MCG tablet, TAKE 1 TABLET BY MOUTH EVERY DAY BEFORE BREAKFAST, Disp: 90 tablet, Rfl: 1   megestrol ([name]) 400 MG/10ML suspension, Take 10 mLs (400 mg total) by mouth daily., Disp: 488 mL, Rfl: 3   senna-docusate (SENOKOT-S) 8.6-50 MG tablet, Take 1 tablet by mouth 2 (two) times daily., Disp: 30 tablet, Rfl: 0   Objective:     BP 124/64   Pulse 86   Temp 98 F (36.7 C)   Ht 5\' 1"  (1.549 m)   Wt 131 lb (59.4 kg)   SpO2 99%   BMI 24.75 kg/m    Physical Exam Constitutional:      General: She is not in acute distress.    Appearance: Normal appearance. She is not ill-appearing, toxic-appearing or diaphoretic.  HENT:     Head: Normocephalic and atraumatic.     Right Ear: External ear normal.     Left Ear: External ear normal.  Eyes:     General: No scleral icterus.       Right eye: No discharge.        Left eye: No discharge.     Extraocular Movements: Extraocular movements intact.     Conjunctiva/sclera: Conjunctivae normal.  Pulmonary:     Effort: Pulmonary effort is normal. No respiratory distress.  Skin:    General: Skin is warm and dry.  Neurological:     Mental  Status: She is alert and oriented to person, place, and time.  Psychiatric:        Mood and Affect: Mood normal.        Behavior: Behavior normal.      No results found for any visits on 09/21/23.    The ASCVD Risk score (Arnett DK, et al., 2019) failed to calculate for the following reasons:   The 2019 ASCVD risk score is only valid for ages 76 to 37    Assessment & Plan:   Acquired hypothyroidism -     TSH  Cognitive decline -     Donepezil HCl; Take 1 tablet (5 mg total) by mouth at bedtime.  Dispense: 90 tablet; Refill: 1 -     ATN PROFILE -     RPR    Return in about 3 months (around 12/19/2023).    Mliss Sax, MD

## 2023-09-22 LAB — RPR: RPR Ser Ql: NONREACTIVE

## 2023-09-24 LAB — ATN PROFILE
A -- Beta-amyloid 42/40 Ratio: 0.084 — ABNORMAL LOW (ref >0.102)
Beta-amyloid 40: 417.72 pg/mL
Beta-amyloid 42: 34.94 pg/mL
N -- NfL, Plasma: 18.9 pg/mL — ABNORMAL HIGH (ref 0.00–11.55)
T -- p-tau181: 2.66 pg/mL — ABNORMAL HIGH (ref 0.00–0.97)

## 2023-10-02 NOTE — Progress Notes (Signed)
 Office Visit Note  Patient: [name] [name]             Date of Birth: Jul 13, 1935           MRN: [id number]             PCP: Mliss Sax, MD Referring: Mliss Sax,* Visit Date: 10/15/2023 Occupation: @GUAROCC @  Subjective:  Medication management  History of Present Illness: [name] [name] is a 89 y.o. female with seropositive rheumatoid arthritis and osteoarthritis.  She returns today after her last visit in October.  According to her son she was having some stiffness in her right shoulder this morning but patient states that her shoulder is not bothersome anymore.  None of her other joints are painful.  She has been taking leflunomide 10 mg p.o. daily for rheumatoid arthritis without any interruption.  She denies any side effects from leflunomide.    Activities of Daily Living:  Patient reports morning stiffness for 0 minutes.   Patient Denies nocturnal pain.  Difficulty dressing/grooming: Denies Difficulty climbing stairs: Denies Difficulty getting out of chair: Reports Difficulty using hands for taps, buttons, cutlery, and/or writing: Denies  Review of Systems  Constitutional:  Negative for fatigue.  HENT:  Negative for mouth sores and mouth dryness.   Eyes:  Negative for dryness.  Respiratory:  Negative for shortness of breath.   Cardiovascular:  Negative for chest pain and palpitations.  Gastrointestinal:  Negative for blood in stool, constipation and diarrhea.  Endocrine: Negative for increased urination.  Genitourinary:  Negative for involuntary urination.  Musculoskeletal:  Positive for joint pain and joint pain. Negative for gait problem, joint swelling, myalgias, muscle weakness, morning stiffness, muscle tenderness and myalgias.  Skin:  Negative for color change, rash, hair loss and sensitivity to sunlight.  Allergic/Immunologic: Negative for susceptible to infections.  Neurological:  Negative for dizziness and headaches.  Hematological:   Negative for swollen glands.  Psychiatric/Behavioral:  Negative for depressed mood and sleep disturbance. The patient is not nervous/anxious.     PMFS History:  Patient Active Problem List   Diagnosis Date Noted   Cognitive decline 09/21/2023   Constipation by delayed colonic transit 11/17/2022   Aphthous ulcer 11/04/2022   Neutropenic fever (HCC) 10/31/2022   Dehydration 10/29/2022   AKI (acute kidney injury) (HCC) 10/28/2022   Pancytopenia (HCC) 10/28/2022   Stomatitis 10/28/2022   Pre-diabetes 10/28/2022   Protein-calorie malnutrition, severe 09/25/2022   Acute diarrhea 09/24/2022   Unintentional weight loss 09/24/2022   Impaired mental alertness 09/24/2022   Acute renal failure superimposed on stage 3a chronic kidney disease (HCC) 09/24/2022   Need for RSV immunization 04/23/2022   Prediabetes 04/23/2022   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   Goals of care, counseling/discussion 08/06/2021   Need for influenza vaccination 04/18/2021   Iron deficiency anemia 11/20/2020   Lymphocytosis 10/16/2020   Neutropenia (HCC) 10/16/2020   Paroxysmal supraventricular tachycardia (HCC) 10/15/2020   Elevated LDL cholesterol level 01/12/2020   DDD (degenerative disc disease), cervical 07/13/2018   Chronic kidney disease (CKD) stage G3b 01/21/2017   History of hypothyroidism 01/20/2017   History of congestive heart failure 08/26/2016   Osteopenia of multiple sites 08/25/2016   DDD cervical spine 08/25/2016   Primary osteoarthritis of both hands 08/25/2016   High risk medication use 06/11/2016   CHF, acute (HCC) 02/01/2013   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 Doreene Burke per patient's son.   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   Rheumatoid arthritis(714.0) 01/19/2007    Family History  Problem Relation Age of Onset   Diabetes Father    Heart disease Father    Cancer Sister        breast    Heart disease Sister    Heart attack Brother    Heart attack Brother    Heart attack Brother    Past Surgical History:  Procedure Laterality Date   ABDOMINAL HYSTERECTOMY     APPENDECTOMY     CHOLECYSTECTOMY     NECK SURGERY     Social History   Social History Narrative   Not on file   Immunization History  Administered Date(s) Administered   Fluad Quad(high Dose 65+) 05/06/2019, 04/18/2021   Influenza,inj,Quad PF,6+ Mos 07/06/2013   Pneumococcal Conjugate-13 07/06/2013   Pneumococcal Polysaccharide-23 04/09/2011, 06/21/2012   Tdap 04/09/2011     Objective: Vital Signs: BP 113/65 (BP Location: Left Arm, Patient Position: Sitting, Cuff Size: Normal)   Pulse 98   Resp 13   Ht 5\' 1"  (1.549 m)   Wt 127 lb 9.6 oz (57.9 kg)   BMI 24.11 kg/m    Physical Exam Vitals and nursing note reviewed.  Constitutional:      Appearance: She is well-developed.  HENT:     Head: Normocephalic and atraumatic.  Eyes:     Conjunctiva/sclera: Conjunctivae normal.  Cardiovascular:     Rate and Rhythm: Normal rate and regular rhythm.     Heart sounds: Normal heart sounds.  Pulmonary:     Effort: Pulmonary effort is normal.     Breath sounds: Normal breath sounds.  Abdominal:     General: Bowel sounds are normal.     Palpations: Abdomen is soft.  Musculoskeletal:     Cervical back: Normal range of motion.  Lymphadenopathy:     Cervical: No cervical adenopathy.  Skin:    General: Skin is warm and dry.     Capillary Refill: Capillary refill takes less than 2 seconds.  Neurological:     Mental Status: She is alert and oriented to person, place, and time.  Psychiatric:        Behavior: Behavior  normal.      Musculoskeletal Exam: She had some limitation with lateral rotation of the cervical spine.  Shoulder joints, elbow joints, wrist joints, MCPs PIPs and DIPs Juengel range of motion with no synovitis.  Hip joints and knee joints in good range of motion without any warmth swelling or effusion.  There was no tenderness over ankles or MTPs.  CDAI Exam: CDAI Score: -- Patient Global: --; Provider Global: -- Swollen: --; Tender: -- Joint Exam 10/15/2023   No joint exam has been documented for this visit   There is currently no information documented on the homunculus. Go to the Rheumatology activity and complete the homunculus joint exam.  Investigation: No additional findings.  Imaging: No results found.  Recent Labs: Lab Results  Component Value Date   WBC 10.9 (H) 10/06/2023   HGB 11.3 (L) 10/06/2023   PLT 199 10/06/2023   NA 139 10/06/2023   K 4.6 10/06/2023   CL 108 10/06/2023   CO2 21 (L) 10/06/2023   GLUCOSE 88 10/06/2023  BUN 34 (H) 10/06/2023   CREATININE 1.41 (H) 10/06/2023   BILITOT 0.5 10/06/2023   ALKPHOS 74 10/06/2023   AST 25 10/06/2023   ALT 19 10/06/2023   PROT 7.8 10/06/2023   ALBUMIN 4.2 10/06/2023   CALCIUM 9.8 10/06/2023   GFRAA 50 (L) 08/09/2020    Speciality Comments: PLQ Eye Exam: 10/04/2019 @ [facility]  Prior therapy: Methotrexate (lung injury) NOT CURRENTLY ON PLQ  Procedures:  No procedures performed Allergies: Tramadol and Codeine phosphate   Assessment / Plan:     Visit Diagnoses: Rheumatoid arthritis involving multiple sites with positive rheumatoid factor (HCC)-patient had no synovitis on the examination.  Patient states that her shoulders are not painful although her son mentioned that this morning she complained of right shoulder pain.  As she has been taking leflunomide 10 mg daily without any interruption.  All the joints were in good range of motion without any synovitis on the examination today.  High risk  medication use - Arava 10 mg 1 tablet daily for rheumatoid arthritis. MTX previously prescribed by Dr. Myna Hidalgo for LGL leukemia from 12/22- 11/07/2022.MTX d/c-pancytopenia..  October 06, 2023 WBC count was elevated at 10.5, hemoglobin 11.3, creatinine 1.41 rest of labs are normal.  She was advised to get labs every 3 months.  Information on immunization was placed in the AVS.  She was advised to hold leflunomide if she develops an infection resume after the infection resolves.  Primary osteoarthritis of both hands-she has bilateral PIP and DIP thickening.  No synovitis was noted.  DDD (degenerative disc disease), cervical-she had range of motion of the cervical spine without any discomfort.  Osteopenia of multiple sites-I do not have a recent DEXA scan in the system.  Patient states she has been getting DEXA scan through her PCP.  Advised him to discuss the status of her bone density with the PCP.  Chronic kidney disease (CKD) stage G3b- her GFR has been low and stable.  According to her son notes from not drinking enough water.  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   Methotrexate-- LGL leukemia from December 2022 till 11/07/2022.  Methotrexate was discontinued due to pancytopenia.  Other neutropenia (HCC)  Lymphocytosis  History of iron deficiency anemia  Essential hypertension  History of congestive heart failure  Dyslipidemia  History of hypothyroidism  Orders: No orders of the defined types were placed in this encounter.  No orders of the defined types were placed in this encounter.    Follow-Up Instructions: Return in about 5 months (around 03/16/2024) for Rheumatoid arthritis, Osteoarthritis.   Pollyann Savoy, MD  Note - This record has been created using Animal nutritionist.  Chart creation errors have been sought, but may not always  have been located. Such creation errors do not reflect on  the  standard of medical care.

## 2023-10-06 ENCOUNTER — Inpatient Hospital Stay: Payer: Medicare HMO

## 2023-10-06 ENCOUNTER — Other Ambulatory Visit: Payer: Self-pay

## 2023-10-06 ENCOUNTER — Inpatient Hospital Stay (HOSPITAL_BASED_OUTPATIENT_CLINIC_OR_DEPARTMENT_OTHER): Payer: Medicare HMO | Admitting: Hematology & Oncology

## 2023-10-06 ENCOUNTER — Inpatient Hospital Stay: Payer: Medicare HMO | Attending: Hematology & Oncology

## 2023-10-06 ENCOUNTER — Encounter: Payer: Self-pay | Admitting: Hematology & Oncology

## 2023-10-06 VITALS — BP 145/43 | HR 91 | Temp 98.8°F | Resp 16 | Ht 61.0 in | Wt 127.0 lb

## 2023-10-06 DIAGNOSIS — M069 Rheumatoid arthritis, unspecified: Secondary | ICD-10-CM | POA: Diagnosis not present

## 2023-10-06 DIAGNOSIS — C91Z0 Other lymphoid leukemia not having achieved remission: Secondary | ICD-10-CM

## 2023-10-06 DIAGNOSIS — G309 Alzheimer's disease, unspecified: Secondary | ICD-10-CM | POA: Insufficient documentation

## 2023-10-06 DIAGNOSIS — F0280 Dementia in other diseases classified elsewhere without behavioral disturbance: Secondary | ICD-10-CM | POA: Insufficient documentation

## 2023-10-06 DIAGNOSIS — M0579 Rheumatoid arthritis with rheumatoid factor of multiple sites without organ or systems involvement: Secondary | ICD-10-CM

## 2023-10-06 LAB — CBC WITH DIFFERENTIAL (CANCER CENTER ONLY)
Abs Immature Granulocytes: 0.02 10*3/uL (ref 0.00–0.07)
Basophils Absolute: 0.1 10*3/uL (ref 0.0–0.1)
Basophils Relative: 1 %
Eosinophils Absolute: 0 10*3/uL (ref 0.0–0.5)
Eosinophils Relative: 0 %
HCT: 35.2 % — ABNORMAL LOW (ref 36.0–46.0)
Hemoglobin: 11.3 g/dL — ABNORMAL LOW (ref 12.0–15.0)
Immature Granulocytes: 0 %
Lymphocytes Relative: 86 %
Lymphs Abs: 9.4 10*3/uL — ABNORMAL HIGH (ref 0.7–4.0)
MCH: 27 pg (ref 26.0–34.0)
MCHC: 32.1 g/dL (ref 30.0–36.0)
MCV: 84.2 fL (ref 80.0–100.0)
Monocytes Absolute: 0.9 10*3/uL (ref 0.1–1.0)
Monocytes Relative: 8 %
Neutro Abs: 0.5 10*3/uL — ABNORMAL LOW (ref 1.7–7.7)
Neutrophils Relative %: 5 %
Platelet Count: 199 10*3/uL (ref 150–400)
RBC: 4.18 MIL/uL (ref 3.87–5.11)
RDW: 14.4 % (ref 11.5–15.5)
Smear Review: NORMAL
WBC Count: 10.9 10*3/uL — ABNORMAL HIGH (ref 4.0–10.5)
nRBC: 0 % (ref 0.0–0.2)

## 2023-10-06 LAB — CMP (CANCER CENTER ONLY)
ALT: 19 U/L (ref 0–44)
AST: 25 U/L (ref 15–41)
Albumin: 4.2 g/dL (ref 3.5–5.0)
Alkaline Phosphatase: 74 U/L (ref 38–126)
Anion gap: 10 (ref 5–15)
BUN: 34 mg/dL — ABNORMAL HIGH (ref 8–23)
CO2: 21 mmol/L — ABNORMAL LOW (ref 22–32)
Calcium: 9.8 mg/dL (ref 8.9–10.3)
Chloride: 108 mmol/L (ref 98–111)
Creatinine: 1.41 mg/dL — ABNORMAL HIGH (ref 0.44–1.00)
GFR, Estimated: 36 mL/min — ABNORMAL LOW (ref >60)
Glucose, Bld: 88 mg/dL (ref 70–99)
Potassium: 4.6 mmol/L (ref 3.5–5.1)
Sodium: 139 mmol/L (ref 135–145)
Total Bilirubin: 0.5 mg/dL (ref 0.0–1.2)
Total Protein: 7.8 g/dL (ref 6.5–8.1)

## 2023-10-06 LAB — SAMPLE TO BLOOD BANK

## 2023-10-06 NOTE — Progress Notes (Signed)
 Hematology and Oncology Follow Up Visit  [name] [name] [id number] 04-May-1935 89 y.o. 10/06/2023   Principle Diagnosis:  Large granular lymphocytic leukemia Rheumatoid arthritis   Current Therapy:        Methotrexate 20 mg p.o. weekly- start on 07/27/2021 - stopped 11/07/2022 d/t pancytopenia  GM-CSF/G-CSF q 2-3 week   Interim History:  [name] [name] is here today with her son for follow-up.  She has been diagnosed with Alzheimer's.  I suppose we really should not be all that surprised by this.  I think she now is on Aricept.  Again her weight is going down.  She still is not eating all that much.  I really think that the Alzheimer's has a factor in this.  She has had no infection.  She has had no fever.  There is been no bleeding.  She has had no rashes.  Has been no leg swelling.  She has had no nausea or vomiting.  Overall, I would have to say that her performance status is probably ECOG 3.    Medications:  Allergies as of 10/06/2023       Reactions   Tramadol Nausea Only   Codeine Phosphate Nausea Only        Medication List        Accurate as of October 06, 2023 10:37 AM. If you have any questions, ask your nurse or doctor.          acetaminophen 325 MG tablet Commonly known as: TYLENOL Take 650 mg by mouth as needed for headache.   benazepril 5 MG tablet Commonly known as: LOTENSIN TAKE 2 TABLETS (10MG  TOTAL) BY MOUTH EVERY DAY   donepezil 5 MG disintegrating tablet Commonly known as: ARICEPT ODT Take 1 tablet (5 mg total) by mouth at bedtime.   folic acid 1 MG tablet Commonly known as: FOLVITE Take 1 mg by mouth daily.   Jardiance 10 MG Tabs tablet Generic drug: empagliflozin TAKE 1 TABLET BY MOUTH EVERY DAY BEFORE BREAKFAST   leflunomide 10 MG tablet Commonly known as: ARAVA TAKE 1 TABLET BY MOUTH EVERY DAY   levothyroxine 88 MCG tablet Commonly known as: SYNTHROID TAKE 1 TABLET BY MOUTH EVERY DAY BEFORE BREAKFAST   megestrol 400 MG/10ML  suspension Commonly known as: [name] Take 10 mLs (400 mg total) by mouth daily.   senna-docusate 8.6-50 MG tablet Commonly known as: Senokot-S Take 1 tablet by mouth 2 (two) times daily.        Allergies:  Allergies  Allergen Reactions   Tramadol Nausea Only   Codeine Phosphate Nausea Only    Past Medical History, Surgical history, Social history, and Family History were reviewed and updated.  Review of Systems: Review of Systems  Constitutional: Negative.   HENT: Negative.    Eyes: Negative.   Respiratory: Negative.    Cardiovascular: Negative.   Gastrointestinal: Negative.   Genitourinary: Negative.   Musculoskeletal: Negative.   Skin: Negative.   Neurological: Negative.   Endo/Heme/Allergies: Negative.   Psychiatric/Behavioral: Negative.       Physical Exam: Temperature is 98.8.  Pulse 91.  Blood pressure 145/43.  Weight is 127 pounds.   Wt Readings from Last 3 Encounters:  10/06/23 127 lb (57.6 kg)  09/21/23 131 lb (59.4 kg)  09/08/23 133 lb (60.3 kg)   Physical Exam Vitals reviewed.  HENT:     Head: Normocephalic and atraumatic.  Eyes:     Pupils: Pupils are equal, round, and reactive to light.  Cardiovascular:  Rate and Rhythm: Normal rate and regular rhythm.     Heart sounds: Normal heart sounds.  Pulmonary:     Effort: Pulmonary effort is normal.     Breath sounds: Normal breath sounds.  Abdominal:     General: Bowel sounds are normal.     Palpations: Abdomen is soft.  Musculoskeletal:        General: No tenderness or deformity. Normal range of motion.     Cervical back: Normal range of motion.  Lymphadenopathy:     Cervical: No cervical adenopathy.  Skin:    General: Skin is warm and dry.     Findings: No erythema or rash.  Neurological:     Mental Status: She is alert and oriented to person, place, and time.  Psychiatric:        Behavior: Behavior normal.        Thought Content: Thought content normal.        Judgment: Judgment  normal.     Lab Results  Component Value Date   WBC 10.9 (H) 10/06/2023   HGB 11.3 (L) 10/06/2023   HCT 35.2 (L) 10/06/2023   MCV 84.2 10/06/2023   PLT 199 10/06/2023   Lab Results  Component Value Date   FERRITIN 266 09/25/2022   IRON 28 09/25/2022   TIBC 193 (L) 09/25/2022   UIBC 165 09/25/2022   IRONPCTSAT 15 09/25/2022   Lab Results  Component Value Date   RETICCTPCT 1.6 09/25/2022   RBC 4.18 10/06/2023   No results found for: "KPAFRELGTCHN", "LAMBDASER", "KAPLAMBRATIO" Lab Results  Component Value Date   IGGSERUM 1,266 07/15/2021   IGA 424 (H) 07/15/2021   IGMSERUM 194 07/15/2021   No results found for: "TOTALPROTELP", "ALBUMINELP", "A1GS", "A2GS", "BETS", "BETA2SER", "GAMS", "MSPIKE", "SPEI"   Chemistry      Component Value Date/Time   NA 138 09/08/2023 1230   NA 141 02/03/2019 1129   K 4.8 09/08/2023 1230   CL 107 09/08/2023 1230   CO2 20 (L) 09/08/2023 1230   BUN 30 (H) 09/08/2023 1230   BUN 31 (H) 02/03/2019 1129   CREATININE 1.60 (H) 09/08/2023 1230   CREATININE 1.00 (H) 05/20/2021 1545      Component Value Date/Time   CALCIUM 8.9 09/08/2023 1230   ALKPHOS 64 09/08/2023 1230   AST 20 09/08/2023 1230   ALT 9 09/08/2023 1230   BILITOT 0.5 09/08/2023 1230       Impression and Plan: [name] [name] is a very pleasant 89 yo caucasian female with history of both Large granular lymphocytic leukemia and RA.   I am really not surprised that her white cell count is down a little bit, typically the neutrophil count.  I really think this is correlated with her weight and her nutritional intake.  Now that she has Alzheimer's, I really should not be all that surprised by the fact that she just not eating much.  I just do not think that she remembers to eat.  I would like to see her back in about 5 weeks or so.  I really think this will be reasonable.  I want to get her back before Easter.     Josph Macho, MD 3/4/202510:37 AM

## 2023-10-13 ENCOUNTER — Other Ambulatory Visit: Payer: Self-pay | Admitting: Family Medicine

## 2023-10-13 ENCOUNTER — Telehealth: Payer: Self-pay | Admitting: Dietician

## 2023-10-13 DIAGNOSIS — R7303 Prediabetes: Secondary | ICD-10-CM

## 2023-10-13 DIAGNOSIS — N1832 Chronic kidney disease, stage 3b: Secondary | ICD-10-CM

## 2023-10-13 NOTE — Telephone Encounter (Signed)
 Once again patient screened on [facility]ST for weight loss, she has lost 8# past 2 months who MD believes may be r/t her dementia and forgetting to eat.. First attempt to reach her son Tawanna Cooler since we last spoke in 2024. Provided my cell# on voice mail to return call to set up a time to talk.  Gennaro Africa, RDN, LDN Registered Dietitian, [facility] Part Time Remote (Usual office hours: Tuesday-Thursday) Cell: [phone]

## 2023-10-14 ENCOUNTER — Inpatient Hospital Stay: Admitting: Dietician

## 2023-10-14 ENCOUNTER — Inpatient Hospital Stay: Admitting: Licensed Clinical Social Worker

## 2023-10-14 NOTE — Progress Notes (Signed)
 [facility] CSW Progress Note  Clinical Child psychotherapist contacted caregiver by phone to provide support.  Todd, patient's son, stated he is considering placing patient in an assisted living facility.  Discussed options and provided information about [facility] to answer questions regarding her insurance.  He stated patient won't eat if she is alone and he is concerned.  He and his wife live about 25 minutes from patient.  Patient's brother lives about 5 minutes from her.  All family members are very supportive.    Dorothey Baseman, LCSW Clinical Social Worker [facility]

## 2023-10-14 NOTE — Progress Notes (Signed)
 Nutrition Follow Up:   Patient's son Tawanna Cooler returned call from [facility] screen for recent weight loss. He reports appetite is much better when she takes her [name] but she doesn't remember or like to drink it.  He is looking into services to aid in overall socialization and health over sight for his mom as her dementia has gotten worse and she is now having some hallucinations, doesn't remember his calls or visits from family.  She will eat if people are with her, and she has lot foods available, lots of protein drinks but he can't get there enough to sit with her to make her eat.  He reached out to meal on wheels but her income too high to qualify.  He's going to an elder law specialist to see how to best advocate for her care.  She is receptive to considering assisted living if needed.  He has good knowledge of approaches that he tried in past to improve her nourishment. Offered resources and he declined need.  Marland Kitchen  Her brother still visit as he is able but she doesn't recall the visits or if she's eaten.Marland Kitchen     Anthropometrics: weight loss 6# past month, anticipate continued weight loss with advanced dementia. Height: 61" Weight:  10/06/23  127# 09/08/23  133# BMI: 24   Estimated Energy Needs  Kcals: 1400-1700 Protein: 58-72 g Fluid: 2 L   NUTRITION DIAGNOSIS:    Previous Dx.  Severe Malnutrition related to chronic illness (leukemia) as evidenced by moderate fat depletion, severe muscle depletion, energy intake < or equal to 75% for > or equal to 1 month. Improving with weight gain and improved appetite.    GOAL:  Weight maintenance  MONITOR:    PO intake, Labs, Weight trends,   Intervention:  Provided empathy and encouragement for his support and situation. Offered to have LCSW contact him to see if there were resources she could offer to aid in finding care solutions.    Next Visit: PRN at provider or son's request   Gennaro Africa, RDN, LDN Registered Dietitian, [facility] Part Time Remote (Usual office hours: Tuesday-Thursday) Mobile: [phone]

## 2023-10-15 ENCOUNTER — Ambulatory Visit: Payer: Medicare HMO | Attending: Rheumatology | Admitting: Rheumatology

## 2023-10-15 ENCOUNTER — Encounter: Payer: Self-pay | Admitting: Rheumatology

## 2023-10-15 VITALS — BP 113/65 | HR 98 | Resp 13 | Ht 61.0 in | Wt 127.6 lb

## 2023-10-15 DIAGNOSIS — Z8639 Personal history of other endocrine, nutritional and metabolic disease: Secondary | ICD-10-CM

## 2023-10-15 DIAGNOSIS — D72820 Lymphocytosis (symptomatic): Secondary | ICD-10-CM

## 2023-10-15 DIAGNOSIS — M5030 Other cervical disc degeneration, unspecified cervical region: Secondary | ICD-10-CM | POA: Diagnosis not present

## 2023-10-15 DIAGNOSIS — M8589 Other specified disorders of bone density and structure, multiple sites: Secondary | ICD-10-CM

## 2023-10-15 DIAGNOSIS — Z8679 Personal history of other diseases of the circulatory system: Secondary | ICD-10-CM

## 2023-10-15 DIAGNOSIS — M19042 Primary osteoarthritis, left hand: Secondary | ICD-10-CM

## 2023-10-15 DIAGNOSIS — M7061 Trochanteric bursitis, right hip: Secondary | ICD-10-CM

## 2023-10-15 DIAGNOSIS — Z862 Personal history of diseases of the blood and blood-forming organs and certain disorders involving the immune mechanism: Secondary | ICD-10-CM | POA: Diagnosis not present

## 2023-10-15 DIAGNOSIS — M0579 Rheumatoid arthritis with rheumatoid factor of multiple sites without organ or systems involvement: Secondary | ICD-10-CM

## 2023-10-15 DIAGNOSIS — E785 Hyperlipidemia, unspecified: Secondary | ICD-10-CM | POA: Diagnosis not present

## 2023-10-15 DIAGNOSIS — N1832 Chronic kidney disease, stage 3b: Secondary | ICD-10-CM

## 2023-10-15 DIAGNOSIS — Z79899 Other long term (current) drug therapy: Secondary | ICD-10-CM | POA: Diagnosis not present

## 2023-10-15 DIAGNOSIS — M19041 Primary osteoarthritis, right hand: Secondary | ICD-10-CM

## 2023-10-15 DIAGNOSIS — I10 Essential (primary) hypertension: Secondary | ICD-10-CM

## 2023-10-15 DIAGNOSIS — D708 Other neutropenia: Secondary | ICD-10-CM | POA: Diagnosis not present

## 2023-10-15 DIAGNOSIS — C91Z0 Other lymphoid leukemia not having achieved remission: Secondary | ICD-10-CM

## 2023-10-15 NOTE — Patient Instructions (Signed)
Standing Labs We placed an order today for your standing lab work.   Please have your standing labs drawn in June and every 3 months  Please have your labs drawn 2 weeks prior to your appointment so that the provider can discuss your lab results at your appointment, if possible.  Please note that you may see your imaging and lab results in [facility] before we have reviewed them. We will contact you once all results are reviewed. Please allow our office up to 72 hours to thoroughly review all of the results before contacting the office for clarification of your results.  WALK-IN LAB HOURS  Monday through Thursday from 8:00 am -12:30 pm and 1:00 pm-5:00 pm and Friday from 8:00 am-12:00 pm.  Patients with office visits requiring labs will be seen before walk-in labs.  You may encounter longer than normal wait times. Please allow additional time. Wait times may be shorter on  Monday and Thursday afternoons.  We do not book appointments for walk-in labs. We appreciate your patience and understanding with our staff.   Labs are drawn by [facility]. Please bring your co-pay at the time of your lab draw.  You may receive a bill from [facility] for your lab work.  Please note if you are on Hydroxychloroquine and and an order has been placed for a Hydroxychloroquine level,  you will need to have it drawn 4 hours or more after your last dose.  If you wish to have your labs drawn at another location, please call the office 24 hours in advance so we can fax the orders.  The office is located at [address], [place], [place], [place] [address]   If you have any questions regarding directions or hours of operation,  please call [phone].   As a reminder, please drink plenty of water prior to coming for your lab work. Thanks!  Vaccines You are taking a medication(s) that can suppress your immune system.  The following immunizations are recommended: Flu annually Covid-19  RSV Td/Tdap (tetanus,  diphtheria, pertussis) every 10 years Pneumonia (Prevnar 15 then Pneumovax 23 at least 1 year apart.  Alternatively, can take Prevnar 20 without needing additional dose) Shingrix: 2 doses from 4 weeks to 6 months apart  Please check with your PCP to make sure you are up to date.   If you have signs or symptoms of an infection or start antibiotics: First, call your PCP for workup of your infection. Hold your medication through the infection, until you complete your antibiotics, and until symptoms resolve if you take the following: Injectable medication (Actemra, Benlysta, Cimzia, Cosentyx, Enbrel, Humira, Kevzara, Orencia, Remicade, Simponi, Stelara, Taltz, Tremfya) Methotrexate Leflunomide (Arava) Mycophenolate (Cellcept) Morrie Sheldon, Olumiant, or Rinvoq

## 2023-11-02 ENCOUNTER — Ambulatory Visit (INDEPENDENT_AMBULATORY_CARE_PROVIDER_SITE_OTHER): Payer: Medicare HMO

## 2023-11-02 VITALS — Ht 61.0 in | Wt 123.0 lb

## 2023-11-02 DIAGNOSIS — Z7409 Other reduced mobility: Secondary | ICD-10-CM

## 2023-11-02 DIAGNOSIS — Z0000 Encounter for general adult medical examination without abnormal findings: Secondary | ICD-10-CM | POA: Diagnosis not present

## 2023-11-02 DIAGNOSIS — Z789 Other specified health status: Secondary | ICD-10-CM | POA: Diagnosis not present

## 2023-11-02 DIAGNOSIS — R419 Unspecified symptoms and signs involving cognitive functions and awareness: Secondary | ICD-10-CM | POA: Diagnosis not present

## 2023-11-02 DIAGNOSIS — R4189 Other symptoms and signs involving cognitive functions and awareness: Secondary | ICD-10-CM | POA: Diagnosis not present

## 2023-11-02 DIAGNOSIS — Z9181 History of falling: Secondary | ICD-10-CM

## 2023-11-02 DIAGNOSIS — Z636 Dependent relative needing care at home: Secondary | ICD-10-CM | POA: Diagnosis not present

## 2023-11-02 NOTE — Addendum Note (Signed)
 Addended by: Recardo Evangelist A on: 11/02/2023 03:06 PM   Modules accepted: Orders

## 2023-11-02 NOTE — Progress Notes (Addendum)
 Please attest and cosign this visit due to patients primary care provider not being in the office at the time the visit was completed.  Because this visit was a virtual/telehealth visit,  certain criteria was not obtained, such a blood pressure, CBG if applicable, and timed get up and go. Any medications not marked as "taking" were not mentioned during the medication reconciliation part of the visit. Any vitals not documented were not able to be obtained due to this being a telehealth visit or patient was unable to self-report a recent blood pressure reading due to a lack of equipment at home via telehealth. Vitals that have been documented are verbally provided by the patient.   Subjective:   [name] [name] is a 89 y.o. who presents for a Medicare Wellness preventive visit.  Visit Complete: Virtual I connected with  Odyssey M Kugler on 11/02/23 by a audio enabled telemedicine application and verified that I am speaking with the correct person using two identifiers.  Patient Location: Home  Provider Location: Home Office  I discussed the limitations of evaluation and management by telemedicine. The patient expressed understanding and agreed to proceed.  Vital Signs: Because this visit was a virtual/telehealth visit, some criteria may be missing or patient reported. Any vitals not documented were not able to be obtained and vitals that have been documented are patient reported.  VideoDeclined- This patient declined Librarian, academic. Therefore the visit was completed with audio only.  Persons Participating in Visit:  Todd(POA/patients son) and patient was present during visit.  AWV Questionnaire: No: Patient Medicare AWV questionnaire was not completed prior to this visit.  Cardiac Risk Factors include: advanced age (>14men, >44 women);sedentary lifestyle;dyslipidemia;hypertension     Objective:    Today's Vitals   11/02/23 0958  Weight: 123 lb (55.8 kg)   Height: 5\' 1"  (1.549 m)   Body mass index is 23.24 kg/m.     11/02/2023   10:04 AM 10/06/2023   10:31 AM 09/08/2023    1:09 PM 09/08/2023   12:42 PM 07/27/2023    1:02 PM 07/27/2023   11:59 AM 06/18/2023   12:02 PM  Advanced Directives  Does Patient Have a Medical Advance Directive? Yes Yes Yes Yes Yes Yes Yes  Type of Estate agent of [place];Living will Living will Living will;Healthcare Power of Attorney Living will;Healthcare Power of Attorney Living will;Healthcare Power of Attorney Living will Living will  Does patient want to make changes to medical advance directive? No - Patient declined No - Patient declined No - Patient declined No - Patient declined No - Patient declined No - Patient declined   Copy of Healthcare Power of Attorney in Chart? Yes - validated most recent copy scanned in chart (See row information)      No - copy requested  Would patient like information on creating a medical advance directive?       No - Patient declined    Current Medications (verified) Outpatient Encounter Medications as of 11/02/2023  Medication Sig   acetaminophen (TYLENOL) 325 MG tablet Take 650 mg by mouth as needed for headache.   benazepril (LOTENSIN) 5 MG tablet TAKE 2 TABLETS (10MG  TOTAL) BY MOUTH EVERY DAY   donepezil (ARICEPT ODT) 5 MG disintegrating tablet Take 1 tablet (5 mg total) by mouth at bedtime.   empagliflozin (JARDIANCE) 10 MG TABS tablet TAKE 1 TABLET BY MOUTH EVERY DAY BEFORE BREAKFAST   folic acid (FOLVITE) 1 MG tablet Take 1 mg by mouth  daily.   leflunomide (ARAVA) 10 MG tablet TAKE 1 TABLET BY MOUTH EVERY DAY   levothyroxine (SYNTHROID) 88 MCG tablet TAKE 1 TABLET BY MOUTH EVERY DAY BEFORE BREAKFAST   megestrol ([name]) 400 MG/10ML suspension Take 10 mLs (400 mg total) by mouth daily.   Multiple Vitamins-Minerals (WOMENS MULTIVITAMIN PO) Take by mouth daily.   senna-docusate (SENOKOT-S) 8.6-50 MG tablet Take 1 tablet by mouth 2 (two) times daily.    No facility-administered encounter medications on file as of 11/02/2023.    Allergies (verified) Tramadol and Codeine phosphate   History: Past Medical History:  Diagnosis Date   Alzheimer disease (HCC)    dx by Dr. Doreene Burke per patient's son.   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   Rheumatoid arthritis(714.0) 01/19/2007   Past Surgical History:  Procedure Laterality Date   ABDOMINAL HYSTERECTOMY     APPENDECTOMY     CHOLECYSTECTOMY     NECK SURGERY     Family History  Problem Relation Age of Onset   Diabetes Father    Heart disease Father    Cancer Sister        breast    Heart disease Sister    Heart attack Brother    Heart attack Brother    Heart attack Brother    Social History   Socioeconomic History   Marital status: Married    Spouse name: Not on file   Number of children: Not on file   Years of education: Not on file   Highest education level: Not on file  Occupational History   Not on file  Tobacco Use   Smoking status: Never    Passive exposure: Never   Smokeless tobacco: Never  Vaping Use   Vaping status: Never Used  Substance and Sexual Activity   Alcohol use: No   Drug use: No   Sexual activity: Not Currently  Other Topics Concern   Not on file  Social History Narrative   Not on file   Social Drivers of Health   Financial Resource Strain: Low Risk  (11/02/2023)   Overall Financial Resource Strain (CARDIA)    Difficulty of Paying Living Expenses: Not hard at all  Food Insecurity: No Food Insecurity (11/02/2023)   Hunger Vital Sign    Worried About Running Out of Food in the Last Year: Never true    Ran Out of Food in the Last Year: Never true  Transportation Needs: No Transportation Needs (11/02/2023)   PRAPARE - Administrator, Civil Service (Medical): No    Lack of  Transportation (Non-Medical): No  Physical Activity: Patient Declined (11/02/2023)   Exercise Vital Sign    Days of Exercise per Week: Patient declined    Minutes of Exercise per Session: Patient declined  Stress: Patient Unable To Answer (11/02/2023)   [employer] of Occupational Health - Occupational Stress Questionnaire    Feeling of Stress : Patient unable to answer  Social Connections: Socially Isolated (11/02/2023)   Social Connection and Isolation Panel [NHANES]    Frequency of Communication with Friends and Family: Twice a week    Frequency of Social Gatherings with Friends and Family: Twice a week    Attends Religious Services: Never    Database administrator or Organizations: No    Attends Banker Meetings: Never    Marital Status: Widowed    Tobacco  Counseling Counseling given: Yes    Clinical Intake:  Pre-visit preparation completed: Yes  Pain : No/denies pain     BMI - recorded: 23.24 Nutritional Status: BMI of 19-24  Normal Nutritional Risks: None Diabetes: No  Lab Results  Component Value Date   HGBA1C 6.1 08/14/2023   HGBA1C 5.9 04/01/2023   HGBA1C 6.3 10/09/2022     How often do you need to have someone help you when you read instructions, pamphlets, or other written materials from your doctor or pharmacy?: 5 - Always (cognitive impairement due to alzheimers dz)  Interpreter Needed?: No  Comments: visit completed with son/POA due to patient having alzheimers disease Information entered by :: Maryjean Ka CMA   Activities of Daily Living     11/02/2023   10:04 AM  In your present state of health, do you have any difficulty performing the following activities:  Hearing? 0  Vision? 0  Difficulty concentrating or making decisions? 1  Walking or climbing stairs? 1  Dressing or bathing? 1  Doing errands, shopping? 1  Preparing Food and eating ? Y  Using the Toilet? Y  In the past six months, have you accidently leaked urine? Y  Do  you have problems with loss of bowel control? Y  Managing your Medications? Y  Managing your Finances? Y  Housekeeping or managing your Housekeeping? Y    Patient Care Team: Mliss Sax, MD as PCP - General (Family Medicine)  Indicate any recent Medical Services you may have received from other than [facility] providers in the past year (date may be approximate).     Assessment:   This is a routine wellness examination for Mahira.  Hearing/Vision screen Hearing Screening - Comments:: No per son Vision Screening - Comments:: Patient wears glasses.up to date on exams   Goals Addressed             This Visit's Progress    Patient Stated       Per patient's son, patient is unable to state a goal due to decline in cognitive health       Depression Screen     11/02/2023   10:05 AM 08/08/2022    9:33 AM 04/23/2022   10:13 AM 10/21/2021   10:42 AM 07/31/2021    9:55 AM 07/31/2021    9:52 AM 04/18/2021   10:47 AM  PHQ 2/9 Scores  PHQ - 2 Score  0 0 0 0 0 0  Exception Documentation Medical reason          Fall Risk     11/02/2023   10:03 AM 09/24/2022    3:07 PM 08/08/2022    9:33 AM 04/23/2022   10:13 AM 10/21/2021   10:42 AM  Fall Risk   Falls in the past year? 1 0 0 0 0  Number falls in past yr: 0 0 0 0 0  Injury with Fall? 0 0 0    Risk for fall due to : History of fall(s);Impaired balance/gait;Orthopedic patient;Impaired mobility;Impaired vision;Mental status change  Medication side effect    Follow up Falls prevention discussed;Falls evaluation completed  Falls prevention discussed;Education provided;Falls evaluation completed      MEDICARE RISK AT HOME:  Medicare Risk at Home Any stairs in or around the home?: Yes If so, are there any without handrails?: No Home free of loose throw rugs in walkways, pet beds, electrical cords, etc?: Yes Adequate lighting in your home to reduce risk of falls?: Yes Life alert?: No Use of  a cane, walker or w/c?: Yes Grab bars  in the bathroom?: Yes Shower chair or bench in shower?: Yes Elevated toilet seat or a handicapped toilet?: Yes  TIMED UP AND GO:  Was the test performed?  No  Cognitive Function: 6CIT completed    11/02/2023   10:01 AM  MMSE - Mini Mental State Exam  Not completed: Unable to complete        08/08/2022    9:35 AM  6CIT Screen  What Year? 0 points  What month? 0 points  What time? 0 points  Count back from 20 0 points  Months in reverse 0 points  Repeat phrase 2 points  Total Score 2 points    Immunizations Immunization History  Administered Date(s) Administered   Fluad Quad(high Dose 65+) 05/06/2019, 04/18/2021   Influenza,inj,Quad PF,6+ Mos 07/06/2013   Pneumococcal Conjugate-13 07/06/2013   Pneumococcal Polysaccharide-23 04/09/2011, 06/21/2012   Tdap 04/09/2011    Screening Tests Health Maintenance  Topic Date Due   DTaP/Tdap/Td (2 - Td or Tdap) 04/08/2021   Medicare Annual Wellness (AWV)  11/01/2024   Pneumonia Vaccine 69+ Years old  Completed   DEXA SCAN  Completed   HPV VACCINES  Aged Out   INFLUENZA VACCINE  Discontinued   MAMMOGRAM  Discontinued   COVID-19 Vaccine  Discontinued   Zoster Vaccines- Shingrix  Discontinued    Health Maintenance  Health Maintenance Due  Topic Date Due   DTaP/Tdap/Td (2 - Td or Tdap) 04/08/2021   Health Maintenance Items Addressed: CRR placed  Additional Screening:  Vision Screening: Recommended annual ophthalmology exams for early detection of glaucoma and other disorders of the eye.  Dental Screening: Recommended annual dental exams for proper oral hygiene  Community Resource Referral / Chronic Care Management: CRR required this visit?  Yes   CCM required this visit?  No     Plan:     I have personally reviewed and noted the following in the patient's chart:   Medical and social history Use of alcohol, tobacco or illicit drugs  Current medications and supplements including opioid prescriptions. Patient  is not currently taking opioid prescriptions. Functional ability and status Nutritional status Physical activity Advanced directives List of other physicians Hospitalizations, surgeries, and ER visits in previous 12 months Vitals Screenings to include cognitive, depression, and falls Referrals and appointments  In addition, I have reviewed and discussed with patient certain preventive protocols, quality metrics, and best practice recommendations. A written personalized care plan for preventive services as well as general preventive health recommendations were provided to patient.     Jordan Hawks Quashaun Lazalde, CMA   11/02/2023   After Visit Summary: (Mail) Due to this being a telephonic visit, the after visit summary with patients personalized plan was offered to patient via mail   Notes: Please refer to Routing Comments.

## 2023-11-02 NOTE — Addendum Note (Signed)
 Addended by: Recardo Evangelist A on: 11/02/2023 04:38 PM   Modules accepted: Orders, Level of Service

## 2023-11-02 NOTE — Patient Instructions (Signed)
 [name] [name] , Thank you for taking time to come for your Medicare Wellness Visit. I appreciate your ongoing commitment to your health goals. Please review the following plan we discussed and let me know if I can assist you in the future.   Referrals/Orders/Follow-Ups/Clinician Recommendations:  Next Medicare Annual Wellness Visit:   November 04, 2024 at 8:10 am telephone visit.   A referral has been placed for you to check and see what additional resources are available to you.   If you haven't heard from anyone within the next 7 business days, please call them and let them know a referral has been placed  Phone: [phone] This is a list of the screening recommended for you and due dates:  Health Maintenance  Topic Date Due   DTaP/Tdap/Td vaccine (2 - Td or Tdap) 04/08/2021   Medicare Annual Wellness Visit  11/01/2024   Pneumonia Vaccine  Completed   DEXA scan (bone density measurement)  Completed   HPV Vaccine  Aged Out   Flu Shot  Discontinued   Mammogram  Discontinued   COVID-19 Vaccine  Discontinued   Zoster (Shingles) Vaccine  Discontinued    Advanced directives: (In Chart) A copy of your advanced directives are scanned into your chart should your provider ever need it.  Next Medicare Annual Wellness Visit scheduled for next year: yes  Understanding Your Risk for Falls Millions of people have serious injuries from falls each year. It is important to understand your risk of falling. Talk with your health care provider about your risk and what you can do to lower it. If you do have a serious fall, make sure to tell your provider. Falling once raises your risk of falling again. How can falls affect me? Serious injuries from falls are common. These include: Broken bones, such as hip fractures. Head injuries, such as traumatic brain injuries (TBI) or concussions. A fear of falling can cause you to avoid activities and stay at home. This can make your muscles weaker and raise your  risk for a fall. What can increase my risk? There are a number of risk factors that increase your risk for falling. The more risk factors you have, the higher your risk of falling. Serious injuries from a fall happen most often to people who are older than 87 years old. Teenagers and young adults ages 46-29 are also at higher risk. Common risk factors include: Weakness in the lower body. Being generally weak or confused due to long-term (chronic) illness. Dizziness or balance problems. Poor vision. Medicines that cause dizziness or drowsiness. These may include: Medicines for your blood pressure, heart, anxiety, insomnia, or swelling (edema). Pain medicines. Muscle relaxants. Other risk factors include: Drinking alcohol. Having had a fall in the past. Having foot pain or wearing improper footwear. Working at a dangerous job. Having any of the following in your home: Tripping hazards, such as floor clutter or loose rugs. Poor lighting. Pets. Having dementia or memory loss. What actions can I take to lower my risk of falling?     Physical activity Stay physically fit. Do strength and balance exercises. Consider taking a regular class to build strength and balance. Yoga and tai chi are good options. Vision Have your eyes checked every year and your prescription for glasses or contacts updated as needed. Shoes and walking aids Wear non-skid shoes. Wear shoes that have rubber soles and low heels. Do not wear high heels. Do not walk around the house in socks or slippers. Use a  cane or walker as told by your provider. Home safety Attach secure railings on both sides of your stairs. Install grab bars for your bathtub, shower, and toilet. Use a non-skid mat in your bathtub or shower. Attach bath mats securely with double-sided, non-slip rug tape. Use good lighting in all rooms. Keep a flashlight near your bed. Make sure there is a clear path from your bed to the bathroom. Use  night-lights. Do not use throw rugs. Make sure all carpeting is taped or tacked down securely. Remove all clutter from walkways and stairways, including extension cords. Repair uneven or broken steps and floors. Avoid walking on icy or slippery surfaces. Walk on the grass instead of on icy or slick sidewalks. Use ice melter to get rid of ice on walkways in the winter. Use a cordless phone. Questions to ask your health care provider Can you help me check my risk for a fall? Do any of my medicines make me more likely to fall? Should I take a vitamin D supplement? What exercises can I do to improve my strength and balance? Should I make an appointment to have my vision checked? Do I need a bone density test to check for weak bones (osteoporosis)? Would it help to use a cane or a walker? Where to find more information Centers for Disease Control and Prevention, STEADI: TonerPromos.no Community-Based Fall Prevention Programs: TonerPromos.no [employer] on Aging: BaseRingTones.pl Contact a health care provider if: You fall at home. You are afraid of falling at home. You feel weak, drowsy, or dizzy. This information is not intended to replace advice given to you by your health care provider. Make sure you discuss any questions you have with your health care provider. Document Revised: 03/24/2022 Document Reviewed: 03/24/2022 Elsevier Patient Education  2024 [employer].

## 2023-11-03 DIAGNOSIS — R7303 Prediabetes: Secondary | ICD-10-CM | POA: Diagnosis not present

## 2023-11-03 DIAGNOSIS — F0280 Dementia in other diseases classified elsewhere without behavioral disturbance: Secondary | ICD-10-CM | POA: Diagnosis not present

## 2023-11-03 DIAGNOSIS — C91Z0 Other lymphoid leukemia not having achieved remission: Secondary | ICD-10-CM | POA: Diagnosis not present

## 2023-11-03 DIAGNOSIS — N1832 Chronic kidney disease, stage 3b: Secondary | ICD-10-CM | POA: Diagnosis not present

## 2023-11-03 DIAGNOSIS — G309 Alzheimer's disease, unspecified: Secondary | ICD-10-CM | POA: Diagnosis not present

## 2023-11-04 ENCOUNTER — Telehealth: Payer: Self-pay

## 2023-11-04 NOTE — Progress Notes (Signed)
 Complex Care Management Note  Care Guide Note 11/04/2023 Name: [name] [name] MRN: [id number] DOB: [birth year]  [name] [name] is a 89 y.o. year old female who sees Mliss Sax, MD for primary care. I reached out to [facility] by phone today to offer complex care management services.  [name] was given information about Complex Care Management services today including:   The Complex Care Management services include support from the care team which includes your Nurse Care Manager, Clinical Social Worker, or Pharmacist.  The Complex Care Management team is here to help remove barriers to the health concerns and goals most important to you. Complex Care Management services are voluntary, and the patient may decline or stop services at any time by request to their care team member.   Complex Care Management Consent Status: Patient agreed to services and verbal consent obtained.   Follow up plan:  Telephone appointment with complex care management team member scheduled for:  11/09/23 & 11/12/23.  Encounter Outcome:  Patient Scheduled  [facility]  [facility], [facility] Health Care Management Assistant Direct Dial: [phone]  Fax: [phone]

## 2023-11-08 ENCOUNTER — Other Ambulatory Visit: Payer: Self-pay | Admitting: Hematology & Oncology

## 2023-11-08 DIAGNOSIS — Z8639 Personal history of other endocrine, nutritional and metabolic disease: Secondary | ICD-10-CM

## 2023-11-09 ENCOUNTER — Encounter: Payer: Self-pay | Admitting: Family

## 2023-11-09 ENCOUNTER — Ambulatory Visit: Payer: Self-pay | Admitting: Licensed Clinical Social Worker

## 2023-11-09 NOTE — Patient Instructions (Signed)
 Visit Information  Thank you for taking time to visit with me today. Please don't hesitate to contact me if I can be of assistance to you.   Following are the goals we discussed today:   Goals Addressed             This Visit's Progress    VBCI Social Work Care Plan       Problems:   Level of Care Concerns:Memory Deficits Pt son reports concerns with pt residing alone and forgetting to take medication  CSW Clinical Goal(s):   Over the next 3 weeks the Caregiver will explore community resource options for unmet needs related to level of care work with Child psychotherapist to address concerns related to level of care options .  Interventions:  Level of Care Concerns in a patient with  memory issues and ckd Current level of care: home, alone Evaluation of patient's unmet needs in current living environment ADL's Discussed private pay options for personal care needs (adult day centers and personal care assistance)  Facility  Provided a list of facilities based on level of care needs  Advance Directive Not discussed during this call    Discussed caregiver resources and support: emailed pt son information of community support options   Patient Goals/Self-Care Activities:  Solicitor community support options emailed to T. Tilly on 11/09/2023  Plan:   Telephone follow up appointment with care management team member scheduled for:  11/23/2023        Our next appointment is by telephone on 11/23/2023 at 11am  Please call the care guide team at [phone] if you need to cancel or reschedule your appointment.   If you are experiencing a Mental Health or Behavioral Health Crisis or need someone to talk to, please call 911   Patient verbalizes understanding of instructions and care plan provided today and agrees to view in MyChart. Active MyChart status and patient understanding of how to access instructions and care plan via MyChart confirmed with patient.     Telephone follow up  appointment with care management team member scheduled for:11/23/2023  Gwyndolyn Saxon MSW, LCSW Licensed Clinical Social Worker  [facility], Population Health Direct Dial: [phone]  Fax: [phone]

## 2023-11-09 NOTE — Patient Outreach (Signed)
 Complex Care Management   Visit Note  11/09/2023  Name:  [name] [name] MRN: [id number] DOB: Sep 22, 1934  Situation: Referral received for Complex Care Management related to SDOH Barriers:  level of care  I obtained verbal consent from n/a.  Visit completed with pt son [name] [name]  on the phone  Background:   Past Medical History:  Diagnosis Date   Alzheimer disease [facility])    dx by Dr. Doreene Burke per patient's son.   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  Medications Reviewed Today   Medications were not reviewed in this encounter     Recommendation:  Contact community support options emailed 11/09/2023   Follow Up Plan:   Telephone follow-up 11/23/2023 Gwyndolyn Saxon MSW, LCSW Licensed Clinical Social Worker  [facility], Population Health Direct Dial: [phone]  Fax: [phone]

## 2023-11-11 ENCOUNTER — Telehealth: Payer: Self-pay | Admitting: Family Medicine

## 2023-11-11 NOTE — Telephone Encounter (Signed)
error 

## 2023-11-12 ENCOUNTER — Ambulatory Visit: Payer: Medicare HMO | Admitting: Family Medicine

## 2023-11-12 ENCOUNTER — Other Ambulatory Visit: Payer: Self-pay

## 2023-11-12 NOTE — Patient Outreach (Addendum)
 Complex Care Management   Visit Note  11/12/2023  Name:  [name] [name] MRN: [id number] DOB: Aug 18, 1934  Situation: Referral received for Complex Care Management related to Dementia I obtained verbal consent from patient son Tawanna Cooler.  Visit completed with patients son Tawanna Cooler  on the phone  Background:   Past Medical History:  Diagnosis Date   Alzheimer disease [facility])    dx by Dr. Doreene Burke per patient's son.   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   Rheumatoid arthritis(714.0) 01/19/2007    Assessment: Patient Reported Symptoms:  Cognitive Poor judgment in daily scenarios, Requires Assistance Decision Making, Struggling with memory recall, Confused or disoriented (assessment completed via pt son.)  Neurological Dizziness (RNCM recommended BP cuff purchase and take BP readings and document)    HEENT No symptoms reported    Cardiovascular No symptoms reported    Respiratory      Endocrine No symptoms reported    Gastrointestinal Change in appetite (loss of appetite due to dementia)    Genitourinary No symptoms reported    Integumentary No symptoms reported    Musculoskeletal No symptoms reported, Difficulty walking (RA, uses a cane)    Psychosocial       There were no vitals filed for this visit.  Medications Reviewed Today     Reviewed by Ruel Favors, RN (Registered Nurse) on 11/12/23 at 1009  Med List Status: <None>   Medication Order Taking? Sig Documenting Provider Last Dose Status Informant  acetaminophen (TYLENOL) 325 MG tablet [id number] Yes Take 650 mg by mouth as needed for headache. [provider] Taking Active Child, Pharmacy Records           Med Note [place] Bells   Tue Dec 30, 2022  9:43 AM) prn  benazepril (LOTENSIN) 5 MG tablet [id number] Yes TAKE 2 TABLETS (10MG  TOTAL) BY MOUTH EVERY DAY Mliss Sax, MD Taking Active   donepezil (ARICEPT ODT) 5 MG disintegrating tablet [id number] Yes Take 1 tablet (5 mg total) by mouth at bedtime. Mliss Sax, MD Taking Active   empagliflozin (JARDIANCE) 10 MG TABS tablet [id number] No TAKE 1 TABLET BY MOUTH EVERY DAY BEFORE BREAKFAST  Patient not taking: Reported on 11/12/2023   Mliss Sax, MD Not Taking Active            Med Note Julian Reil, Sherri Levenhagen   Thu Nov 12, 2023 10:01 AM) As per son, patient was seen by Dr. Signe Colt (Nephrologist) on 11/03/23 and d/c Jardiance  folic acid (FOLVITE) 1 MG tablet [id number] Yes Take 1 mg by mouth daily. [provider] Taking Active   leflunomide (ARAVA) 10 MG tablet [id number] Yes TAKE 1 TABLET BY MOUTH EVERY DAY Deveshwar, Janalyn Rouse, MD Taking Active   levothyroxine (SYNTHROID) 88 MCG tablet [id number] Yes TAKE 1 TABLET BY MOUTH EVERY DAY BEFORE BREAKFAST Ennever, Rose Phi, MD Taking Active   megestrol ([name]) 400 MG/10ML suspension [id number] Yes Take 10 mLs (400 mg total) by mouth daily. Josph Macho, MD Taking Active            Med Note Julian Reil, Kenora Spayd   Thu Nov 12, 2023 10:08 AM) Son reports patient often not taking regularly due to taste  RN suggested childrens flavoring  Son will contact pharmacy  Multiple Vitamins-Minerals (WOMENS MULTIVITAMIN PO) [id number] Yes Take by mouth daily. [provider] Taking Active   senna-docusate (SENOKOT-S) 8.6-50 MG tablet  [id number] Yes Take 1 tablet by mouth 2 (two) times daily. Glade Lloyd, MD Taking Active            Med Note Jenne Pane, TEQUILA   Tue Dec 30, 2022  9:44 AM) prn            Recommendation:   Continue to work with Child psychotherapist, Civil Service fast streamer and RNCM to ensure adequate care is provided.   Follow Up Plan:   Telephone follow-up two weeks April 25 at 10:00 RNCM completed outreach call to [facility] to discuss what care options they provide.  None seem appropriate, but will discuss possible Palliative care  services at next appointment.   Ruel Favors BSN RN CCM [place]  [facility], [facility] RN Care Manager Direct Dial: [phone] Fax: [phone]

## 2023-11-12 NOTE — Patient Instructions (Signed)
 Visit Information  Thank you for taking time to visit with me today. Please don't hesitate to contact me if I can be of assistance to you before our next scheduled appointment.  Our next appointment is by telephone on 11/27/23 at 10:00 Please call the care guide team at [phone] if you need to cancel or reschedule your appointment.   Following is a copy of your care plan:   Goals Addressed             This Visit's Progress    VBCI RN Care Plan       Problems:  Chronic Disease Management support and education needs related to Dementia Cognitive Deficits Non-adherence to prescribed medication regimen.  Son reports patient frequently not taking [name] due to the taste.   Patient lives alone, children visit as often as possible, are seeking ALF for future, but patient is at risk for injury due to medication errors, fall risk, failure to thrive.  Goal: Over the next 14 days the Patient will attend all scheduled medical appointments:   as evidenced by chart report and caregiver report        demonstrate Improved adherence to prescribed treatment plan for CHF and Dementia as evidenced by attending all medical appointments, taking all medication as prescribed     Interventions: Patient caregiver will contact pharmacy to inquire about adding flavoring to [name] to improve adherence. RNCM to inquire about possible referral to [facility] for home visits.  Plan:  Next PCP appointment scheduled for: 12/22/23 Next [facility] telephone appointment scheduled for 11/27/23 at 10:00             Please call the Suicide and Crisis Lifeline: 988 call the [place] National Suicide Prevention Lifeline: 5518780253 or TTY: 936-023-6222 TTY (859)732-4962) to talk to a trained counselor call 1-800-273-TALK (toll free, 24 hour hotline) if you are experiencing a Mental Health or Behavioral Health Crisis or need someone to talk to.  Patient verbalizes understanding of instructions and care plan provided  today and agrees to view in MyChart. Active MyChart status and patient understanding of how to access instructions and care plan via MyChart confirmed with patient.      Ruel Favors BSN RN CCM [place]  [facility], [facility] RN Care Manager Direct Dial: [phone] Fax: [phone]

## 2023-11-16 ENCOUNTER — Other Ambulatory Visit: Payer: Self-pay

## 2023-11-16 ENCOUNTER — Inpatient Hospital Stay: Attending: Hematology & Oncology

## 2023-11-16 ENCOUNTER — Encounter: Payer: Self-pay | Admitting: Hematology & Oncology

## 2023-11-16 ENCOUNTER — Inpatient Hospital Stay (HOSPITAL_BASED_OUTPATIENT_CLINIC_OR_DEPARTMENT_OTHER): Admitting: Hematology & Oncology

## 2023-11-16 VITALS — BP 138/35 | HR 90 | Temp 98.9°F | Resp 16 | Ht 61.0 in | Wt 124.0 lb

## 2023-11-16 DIAGNOSIS — M069 Rheumatoid arthritis, unspecified: Secondary | ICD-10-CM | POA: Diagnosis not present

## 2023-11-16 DIAGNOSIS — G309 Alzheimer's disease, unspecified: Secondary | ICD-10-CM | POA: Insufficient documentation

## 2023-11-16 DIAGNOSIS — M0579 Rheumatoid arthritis with rheumatoid factor of multiple sites without organ or systems involvement: Secondary | ICD-10-CM | POA: Diagnosis not present

## 2023-11-16 DIAGNOSIS — T451X5A Adverse effect of antineoplastic and immunosuppressive drugs, initial encounter: Secondary | ICD-10-CM | POA: Diagnosis not present

## 2023-11-16 DIAGNOSIS — Z79899 Other long term (current) drug therapy: Secondary | ICD-10-CM | POA: Diagnosis not present

## 2023-11-16 DIAGNOSIS — C91Z0 Other lymphoid leukemia not having achieved remission: Secondary | ICD-10-CM

## 2023-11-16 DIAGNOSIS — D701 Agranulocytosis secondary to cancer chemotherapy: Secondary | ICD-10-CM

## 2023-11-16 LAB — CMP (CANCER CENTER ONLY)
ALT: 17 U/L (ref 0–44)
AST: 25 U/L (ref 15–41)
Albumin: 3.9 g/dL (ref 3.5–5.0)
Alkaline Phosphatase: 78 U/L (ref 38–126)
Anion gap: 9 (ref 5–15)
BUN: 28 mg/dL — ABNORMAL HIGH (ref 8–23)
CO2: 23 mmol/L (ref 22–32)
Calcium: 9.5 mg/dL (ref 8.9–10.3)
Chloride: 109 mmol/L (ref 98–111)
Creatinine: 1.37 mg/dL — ABNORMAL HIGH (ref 0.44–1.00)
GFR, Estimated: 38 mL/min — ABNORMAL LOW (ref >60)
Glucose, Bld: 96 mg/dL (ref 70–99)
Potassium: 5.3 mmol/L — ABNORMAL HIGH (ref 3.5–5.1)
Sodium: 141 mmol/L (ref 135–145)
Total Bilirubin: 0.4 mg/dL (ref 0.0–1.2)
Total Protein: 7.5 g/dL (ref 6.5–8.1)

## 2023-11-16 LAB — SAVE SMEAR(SSMR), FOR PROVIDER SLIDE REVIEW

## 2023-11-16 LAB — CBC WITH DIFFERENTIAL (CANCER CENTER ONLY)
Abs Immature Granulocytes: 0.01 10*3/uL (ref 0.00–0.07)
Basophils Absolute: 0.1 10*3/uL (ref 0.0–0.1)
Basophils Relative: 1 %
Eosinophils Absolute: 0 10*3/uL (ref 0.0–0.5)
Eosinophils Relative: 0 %
HCT: 35.7 % — ABNORMAL LOW (ref 36.0–46.0)
Hemoglobin: 11.1 g/dL — ABNORMAL LOW (ref 12.0–15.0)
Immature Granulocytes: 0 %
Lymphocytes Relative: 88 %
Lymphs Abs: 11.3 10*3/uL — ABNORMAL HIGH (ref 0.7–4.0)
MCH: 25.8 pg — ABNORMAL LOW (ref 26.0–34.0)
MCHC: 31.1 g/dL (ref 30.0–36.0)
MCV: 83 fL (ref 80.0–100.0)
Monocytes Absolute: 0.9 10*3/uL (ref 0.1–1.0)
Monocytes Relative: 7 %
Neutro Abs: 0.5 10*3/uL — ABNORMAL LOW (ref 1.7–7.7)
Neutrophils Relative %: 4 %
Platelet Count: 181 10*3/uL (ref 150–400)
RBC: 4.3 MIL/uL (ref 3.87–5.11)
RDW: 15.2 % (ref 11.5–15.5)
Smear Review: NORMAL
WBC Count: 12.7 10*3/uL — ABNORMAL HIGH (ref 4.0–10.5)
nRBC: 0 % (ref 0.0–0.2)

## 2023-11-16 NOTE — Progress Notes (Signed)
 Hematology and Oncology Follow Up Visit  [name] [name] [id number] 22-Apr-1935 89 y.o. 11/16/2023   Principle Diagnosis:  Large granular lymphocytic leukemia Rheumatoid arthritis   Current Therapy:        Methotrexate 20 mg p.o. weekly- start on 07/27/2021 - stopped 11/07/2022 d/t pancytopenia  GM-CSF/G-CSF q 2-3 week   Interim History:  [name] is here today with her son for follow-up.  Unfortunately, she is still not eating much.  She is on [name].  When she takes a [name], she eats well.  However, she does not like the taste of [name].  Her son is trying to find a daycare for her that might improve her outlook.  If she can just be around other people, this might help her and might have her eat better.  For right now, she continues to lose weight.  She is lost 3 pounds since we last saw her.  The Alzheimer's, I am sure, is probably the biggest factor in all of this.  Thankfully, she has had no infections.  She has had no problems with nausea or vomiting.  She has had no change in bowel or bladder habits.  She has had no rashes.  There is been no swollen lymph nodes.  Currently, I would say her performance status is probably ECOG 3.    Medications:  Allergies as of 11/16/2023       Reactions   Tramadol Nausea Only   Codeine Phosphate Nausea Only        Medication List        Accurate as of November 16, 2023 11:13 AM. If you have any questions, ask your nurse or doctor.          STOP taking these medications    Jardiance 10 MG Tabs tablet Generic drug: empagliflozin Stopped by: Josph Macho       TAKE these medications    acetaminophen 325 MG tablet Commonly known as: TYLENOL Take 650 mg by mouth as needed for headache.   benazepril 5 MG tablet Commonly known as: LOTENSIN TAKE 2 TABLETS (10MG  TOTAL) BY MOUTH EVERY DAY   donepezil 5 MG disintegrating tablet Commonly known as: ARICEPT ODT Take 1 tablet (5 mg total) by mouth at bedtime.   folic acid  1 MG tablet Commonly known as: FOLVITE Take 1 mg by mouth daily.   leflunomide 10 MG tablet Commonly known as: ARAVA TAKE 1 TABLET BY MOUTH EVERY DAY   levothyroxine 88 MCG tablet Commonly known as: SYNTHROID TAKE 1 TABLET BY MOUTH EVERY DAY BEFORE BREAKFAST   megestrol 400 MG/10ML suspension Commonly known as: [name] Take 10 mLs (400 mg total) by mouth daily.   senna-docusate 8.6-50 MG tablet Commonly known as: Senokot-S Take 1 tablet by mouth 2 (two) times daily.   WOMENS MULTIVITAMIN PO Take by mouth daily.        Allergies:  Allergies  Allergen Reactions   Tramadol Nausea Only   Codeine Phosphate Nausea Only    Past Medical History, Surgical history, Social history, and Family History were reviewed and updated.  Review of Systems: Review of Systems  Constitutional: Negative.   HENT: Negative.    Eyes: Negative.   Respiratory: Negative.    Cardiovascular: Negative.   Gastrointestinal: Negative.   Genitourinary: Negative.   Musculoskeletal: Negative.   Skin: Negative.   Neurological: Negative.   Endo/Heme/Allergies: Negative.   Psychiatric/Behavioral: Negative.       Physical Exam: Temperature is 98.9.  Pulse 90.  Blood  pressure 138/35.  Weight is 124 pounds.    Wt Readings from Last 3 Encounters:  11/16/23 124 lb (56.2 kg)  11/12/23 126 lb (57.2 kg)  11/02/23 123 lb (55.8 kg)   Physical Exam Vitals reviewed.  HENT:     Head: Normocephalic and atraumatic.  Eyes:     Pupils: Pupils are equal, round, and reactive to light.  Cardiovascular:     Rate and Rhythm: Normal rate and regular rhythm.     Heart sounds: Normal heart sounds.  Pulmonary:     Effort: Pulmonary effort is normal.     Breath sounds: Normal breath sounds.  Abdominal:     General: Bowel sounds are normal.     Palpations: Abdomen is soft.  Musculoskeletal:        General: No tenderness or deformity. Normal range of motion.     Cervical back: Normal range of motion.   Lymphadenopathy:     Cervical: No cervical adenopathy.  Skin:    General: Skin is warm and dry.     Findings: No erythema or rash.  Neurological:     Mental Status: She is alert and oriented to person, place, and time.  Psychiatric:        Behavior: Behavior normal.        Thought Content: Thought content normal.        Judgment: Judgment normal.    Lab Results  Component Value Date   WBC 12.7 (H) 11/16/2023   HGB 11.1 (L) 11/16/2023   HCT 35.7 (L) 11/16/2023   MCV 83.0 11/16/2023   PLT 181 11/16/2023   Lab Results  Component Value Date   FERRITIN 266 09/25/2022   IRON 28 09/25/2022   TIBC 193 (L) 09/25/2022   UIBC 165 09/25/2022   IRONPCTSAT 15 09/25/2022   Lab Results  Component Value Date   RETICCTPCT 1.6 09/25/2022   RBC 4.30 11/16/2023   No results found for: "KPAFRELGTCHN", "LAMBDASER", "KAPLAMBRATIO" Lab Results  Component Value Date   IGGSERUM 1,266 07/15/2021   IGA 424 (H) 07/15/2021   IGMSERUM 194 07/15/2021   No results found for: "TOTALPROTELP", "ALBUMINELP", "A1GS", "A2GS", "BETS", "BETA2SER", "GAMS", "MSPIKE", "SPEI"   Chemistry      Component Value Date/Time   NA 141 11/16/2023 1013   NA 141 02/03/2019 1129   K 5.3 (H) 11/16/2023 1013   CL 109 11/16/2023 1013   CO2 23 11/16/2023 1013   BUN 28 (H) 11/16/2023 1013   BUN 31 (H) 02/03/2019 1129   CREATININE 1.37 (H) 11/16/2023 1013   CREATININE 1.00 (H) 05/20/2021 1545      Component Value Date/Time   CALCIUM 9.5 11/16/2023 1013   ALKPHOS 78 11/16/2023 1013   AST 25 11/16/2023 1013   ALT 17 11/16/2023 1013   BILITOT 0.4 11/16/2023 1013       Impression and Plan: [name] [name] is a very pleasant 89 yo caucasian female with history of both Large granular lymphocytic leukemia and RA.   At least, her white cell count is holding steady.  Her ANC is holding steady which is always a good thing for her.  Hopefully, she will have a nice Easter weekend coming up.  Her birthday comes in about 6  weeks.  I want her to be able to enjoy this.  I will see her back before her birthday.  Hopefully, the weight will not go down further.  I told her that the weight loss is clearly tied to her low neutrophil count.Marland Kitchen  Ivor Mars, MD 4/14/202511:13 AM

## 2023-11-19 DIAGNOSIS — I130 Hypertensive heart and chronic kidney disease with heart failure and stage 1 through stage 4 chronic kidney disease, or unspecified chronic kidney disease: Secondary | ICD-10-CM | POA: Diagnosis not present

## 2023-11-19 DIAGNOSIS — Z8249 Family history of ischemic heart disease and other diseases of the circulatory system: Secondary | ICD-10-CM | POA: Diagnosis not present

## 2023-11-19 DIAGNOSIS — M069 Rheumatoid arthritis, unspecified: Secondary | ICD-10-CM | POA: Diagnosis not present

## 2023-11-19 DIAGNOSIS — Z9181 History of falling: Secondary | ICD-10-CM | POA: Diagnosis not present

## 2023-11-19 DIAGNOSIS — D696 Thrombocytopenia, unspecified: Secondary | ICD-10-CM | POA: Diagnosis not present

## 2023-11-19 DIAGNOSIS — E039 Hypothyroidism, unspecified: Secondary | ICD-10-CM | POA: Diagnosis not present

## 2023-11-19 DIAGNOSIS — E46 Unspecified protein-calorie malnutrition: Secondary | ICD-10-CM | POA: Diagnosis not present

## 2023-11-19 DIAGNOSIS — E785 Hyperlipidemia, unspecified: Secondary | ICD-10-CM | POA: Diagnosis not present

## 2023-11-19 DIAGNOSIS — Z833 Family history of diabetes mellitus: Secondary | ICD-10-CM | POA: Diagnosis not present

## 2023-11-19 DIAGNOSIS — E1122 Type 2 diabetes mellitus with diabetic chronic kidney disease: Secondary | ICD-10-CM | POA: Diagnosis not present

## 2023-11-19 DIAGNOSIS — I509 Heart failure, unspecified: Secondary | ICD-10-CM | POA: Diagnosis not present

## 2023-11-19 DIAGNOSIS — G309 Alzheimer's disease, unspecified: Secondary | ICD-10-CM | POA: Diagnosis not present

## 2023-11-22 ENCOUNTER — Other Ambulatory Visit: Payer: Self-pay | Admitting: Rheumatology

## 2023-11-23 NOTE — Telephone Encounter (Signed)
 Last Fill: 08/20/2023  Labs: 11/16/2023 Potassium 5.3, BUN 28, Creat. 1.37, GFR 38, WBC 12.7, Hgb 11.1, Hct 35.7, MCH 25.8, Neutro Abs 0.5, Lymphs Abs 11.3  Next Visit: 03/23/2024  Last Visit: 10/15/2023  DX: Rheumatoid arthritis involving multiple sites with positive rheumatoid factor   Current Dose per office note 10/03/2023: Arava  10 mg 1 tablet daily for rheumatoid arthritis.   Okay to refill Arava  ?

## 2023-11-27 ENCOUNTER — Telehealth: Payer: Self-pay

## 2023-11-27 ENCOUNTER — Other Ambulatory Visit: Payer: Self-pay

## 2023-11-27 NOTE — Patient Outreach (Signed)
 Complex Care Management   Visit Note  11/27/2023  Name:  [name] [name] MRN: [id number] DOB: Sep 04, 1934  Situation: Referral received for Complex Care Management related to Heart Failure and Dementia I obtained verbal consent from  [name], patient son .  Visit completed with [name]  on the phone  Background:   Past Medical History:  Diagnosis Date   Alzheimer disease [facility])    dx by Dr. Tilmon Font per patient's son.   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   Rheumatoid arthritis(714.0) 01/19/2007    Assessment: Patient Reported Symptoms:  Cognitive Cognitive Status: Confused or disoriented, Poor judgment in daily scenarios, Requires Assistance Decision Making, Other: (patient son reports since stopping Jardiance  cognitive sx have improved slightly) Cognitive/Intellectual Conditions Management [RPT]: None reported or documented in medical history or problem list   Health Maintenance Behaviors: Annual physical exam Healing Pattern: Unsure  Neurological Neurological Review of Symptoms: Headaches (patient reports to son occasional headaches) Neurological Conditions: Alzheimer's disease Neurological Management Strategies: Medication therapy Neurological Self-Management Outcome: 3 (uncertain)  HEENT HEENT Symptoms Reported: No symptoms reported      Cardiovascular Cardiovascular Symptoms Reported: No symptoms reported Does patient have uncontrolled Hypertension?: No Cardiovascular Conditions: Heart failure, Hypertension Cardiovascular Management Strategies: Medication therapy, Routine screening Cardiovascular Self-Management Outcome: 3 (uncertain)  Respiratory Respiratory Symptoms Reported: No symptoms reported    Endocrine Patient reports the following symptoms related to hypoglycemia or hyperglycemia : No symptoms reported Is patient  diabetic?: No Endocrine Conditions: Thyroid  disorder Endocrine Management Strategies: Medication therapy Endocrine Self-Management Outcome: 3 (uncertain)  Gastrointestinal Gastrointestinal Symptoms Reported: No symptoms reported Gastrointestinal Conditions: Constipation Gastrointestinal Management Strategies: Medication therapy Gastrointestinal Self-Management Outcome: 3 (uncertain) Nutrition Risk Screen (CP): Reduced oral intake over the last month, Unintentional loss of 10 lbs or more in the past 2 months (patient with decreased appetite and weight loss due to cognitie decline)  Genitourinary Genitourinary Symptoms Reported: No symptoms reported Genitourinary Conditions: Chronic kidney disease Genitourinary Self-Management Outcome: 3 (uncertain)  Integumentary Integumentary Symptoms Reported: No symptoms reported    Musculoskeletal Musculoskelatal Symptoms Reviewed: No symptoms reported, Difficulty walking (patient uses single point cane or rollator) Musculoskeletal Conditions: Osteoarthritis, Rheumatoid arthritis Musculoskeletal Management Strategies: Medication therapy, Medical device, Routine screening Musculoskeletal Self-Management Outcome: 3 (uncertain) Falls in the past year?: No Number of falls in past year: 1 or less Patient at Risk for Falls Due to: Impaired mobility, Mental status change Fall risk Follow up: Education provided, Falls prevention discussed  Psychosocial Psychosocial Symptoms Reported: Not assessed            08/08/2022    9:33 AM  Depression screen PHQ 2/9  Decreased Interest 0  Down, Depressed, Hopeless 0  PHQ - 2 Score 0    There were no vitals filed for this visit.  Medications Reviewed Today     Reviewed by Clarnce Crow, RN (Registered Nurse) on 11/27/23 at 1024  Med List Status: <None>   Medication Order Taking? Sig Documenting Provider Last Dose Status Informant  acetaminophen  (TYLENOL ) 325 MG tablet [id number] Yes Take 650 mg by mouth as  needed for headache. [provider] Taking Active Child, Pharmacy Records           Med Note Johanna Mutter   Tue Dec 30, 2022  9:43 AM) prn  benazepril  (LOTENSIN ) 5 MG tablet [id number] Yes TAKE 2 TABLETS (10MG  TOTAL) BY MOUTH EVERY DAY Tonna Frederic,  MD Taking Active   donepezil  (ARICEPT  ODT) 5 MG disintegrating tablet [id number] Yes Take 1 tablet (5 mg total) by mouth at bedtime. Tonna Frederic, MD Taking Active   folic acid  (FOLVITE ) 1 MG tablet [id number] Yes Take 1 mg by mouth daily. [provider] Taking Active   leflunomide  (ARAVA ) 10 MG tablet [id number] Yes TAKE 1 TABLET BY MOUTH EVERY DAY Romayne Clubs, PA-C Taking Active   levothyroxine  (SYNTHROID ) 88 MCG tablet [id number] Yes TAKE 1 TABLET BY MOUTH EVERY DAY BEFORE BREAKFAST Ivor Mars, MD Taking Active   megestrol  ([name] ) 400 MG/10ML suspension [id number] Yes Take 10 mLs (400 mg total) by mouth daily. Ivor Mars, MD Taking Active            Med Note Burley Carpenter, Carlito Bogert   Thu Nov 12, 2023 10:08 AM) Son reports patient often not taking regularly due to taste  RN suggested childrens flavoring  Son will contact pharmacy  Multiple Vitamins-Minerals (WOMENS MULTIVITAMIN PO) [id number] Yes Take by mouth daily. [provider] Taking Active   senna-docusate (SENOKOT-S) 8.6-50 MG tablet [id number] Yes Take 1 tablet by mouth 2 (two) times daily. Audria Leather, MD Taking Active            Med Note Tellis Feathers, TEQUILA   Tue Dec 30, 2022  9:44 AM) prn            Recommendation:   Consult with Clinical Pharmacist re changing medication times to take all at once in the morning before breakfast to improve medication compliance.  Check on possibility of adding flavoring to [name]  to improve compliance.  Follow Up Plan:   Telephone follow up appointment date/time:  12/17/23 at 10:00   Clarnce Crow BSN RN CCM [place]  [facility], [facility] RN Care Manager Direct  Dial: [phone] Fax: [phone]

## 2023-11-27 NOTE — Patient Outreach (Signed)
 RNCM Telephone Encounter NoteRNCM Telephone Encounter Note  Called patient son [name] to relay information provided by PCP and pharmacist regarding medication concerns.  Todd expressed understanding and appreciation for assistance.  He will contact [facility] regarding the flavoring of the [name] , as he informed me that he had previously spoken to Neuro about the possibility of tablet form, but was advised that in order to meet the same dosage needs, several pills would be required.  He expressed no further need or concerns and agreed to follow up at next [facility] appointment.   Clarnce Crow BSN RN CCM [place]  [facility], [facility] RN Care Manager Direct Dial: [phone] Fax: [phone]

## 2023-11-27 NOTE — Patient Instructions (Signed)
 Visit Information  Thank you for taking time to visit with me today. Please don't hesitate to contact me if I can be of assistance to you before our next scheduled appointment.  Our next appointment is by telephone on 12/17/23 at 10:00 Please call the care guide team at [phone] if you need to cancel or reschedule your appointment.   Following is a copy of your care plan:   Goals Addressed             This Visit's Progress    VBCI RN Care Plan       Problems:  Chronic Disease Management support and education needs related to Dementia Cognitive Deficits Non-adherence to prescribed medication regimen.  Son reports patient frequently not taking [name]  due to the taste.  Also patient misses dosage due to needing to take at various times during the day.   Patient lives alone, children visit as often as possible, are seeking ALF for future, but patient is at risk for injury due to medication errors, fall risk, failure to thrive.  Goal: Over the next 14 days the Patient will attend all scheduled medical appointments: 11/30/23, SW 12/22/23 PCP  as evidenced by chart report and caregiver report        demonstrate Improved adherence to prescribed treatment plan for CHF and Dementia as evidenced by attending all medical appointments, taking all medication as prescribed     Interventions: Message sent to Clinical Pharmacist to discuss changing medication times to all pills at one time of day, possible flavoring add to [name]  to improve patient compliance. Discussed [facility] in [facility] provided contact info to patient son to check into as care option. Plan: Next PCP appointment scheduled for: 12/22/23 Next [facility] telephone appointment scheduled for 5/15/25at 10:00             Please call the Suicide and Crisis Lifeline: 988 call the [place]  National Suicide Prevention Lifeline: 608-458-7211 or TTY: 949 837 9566 TTY (217) 751-6950) to talk to a trained counselor call 1-800-273-TALK  (toll free, 24 hour hotline) if you are experiencing a Mental Health or Behavioral Health Crisis or need someone to talk to.  Patient verbalizes understanding of instructions and care plan provided today and agrees to view in MyChart. Active MyChart status and patient understanding of how to access instructions and care plan via MyChart confirmed with patient.      Clarnce Crow BSN RN CCM [place]  [facility], [facility] RN Care Manager Direct Dial: [phone] Fax: [phone]

## 2023-11-30 ENCOUNTER — Other Ambulatory Visit: Payer: Self-pay | Admitting: Licensed Clinical Social Worker

## 2023-11-30 NOTE — Patient Outreach (Signed)
 Complex Care Management   Visit Note  11/30/2023  Name:  [name] [name] MRN: [id number] DOB: 10-Aug-1934  Situation: Referral received for Complex Care Management related to  caregiver support  I obtained verbal consent from Caregiver.  Visit completed with [name] [name]   on the phone Follow up completed with Pt son [name] [name]. Pt son advise he received resources provided via email and currently collaborating with family and resources to assist with caregiver tasks.  Background:   Past Medical History:  Diagnosis Date   Alzheimer disease (HCC)    dx by [name] [name] per patient's son.   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  Medications Reviewed Today   Medications were not reviewed in this encounter     Recommendation:   Home Health requests: LCSW [name] [name] provided [name] [name] a list of pca service agencies to assist with caregiver tasks and daycenter options.   Follow Up Plan:   Patient has met all care management goals. Care Management case will be closed. Patient has been provided contact information should new needs arise.   [name] [name] MSW, LCSW Licensed Clinical Social Worker  [facility], Population Health Direct Dial: [phone]  Fax: [phone]

## 2023-11-30 NOTE — Patient Instructions (Signed)
 Visit Information  Thank you for taking time to visit with me today. Please don't hesitate to contact me if I can be of assistance to you before our next scheduled appointment.    Telephone follow up appointment date/time:  12/17/2023 with RNCM   Please call the care guide team at [phone] if you need to cancel, schedule, or reschedule an appointment.   Please call 911 if you are experiencing a Mental Health or Behavioral Health Crisis or need someone to talk to.  Fletcher Humble MSW, LCSW Licensed Clinical Social Worker  [facility], Population Health Direct Dial: [phone]  Fax: [phone]

## 2023-12-17 ENCOUNTER — Other Ambulatory Visit: Payer: Self-pay

## 2023-12-17 NOTE — Patient Outreach (Signed)
 Complex Care Management   Visit Note  12/17/2023  Name:  [name] [name] MRN: [id number] DOB: August 08, 1934  Situation: Referral received for Complex Care Management related to Dementia I obtained verbal consent from patient son [name] [name].  Visit completed with patient son   on the phone  Background:   Past Medical History:  Diagnosis Date   Alzheimer disease [facility])    dx by Dr. Tilmon Font per patient's son.   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   Rheumatoid arthritis(714.0) 01/19/2007    Assessment: Patient Reported Symptoms:  Cognitive Cognitive Status: Able to follow simple commands, Difficulties with attention and concentration, Confused or disoriented, Struggling with memory recall (based on reporting from son [name] [name])      Neurological Neurological Review of Symptoms:  (son reports patient eating and drinking better and not complaining of headache) Neurological Conditions: Alzheimer's disease Neurological Management Strategies: Medication therapy, Routine screening  HEENT HEENT Symptoms Reported: No symptoms reported      Cardiovascular Cardiovascular Symptoms Reported: No symptoms reported    Respiratory Respiratory Symptoms Reported: No symptoms reported    Endocrine Patient reports the following symptoms related to hypoglycemia or hyperglycemia : No symptoms reported    Gastrointestinal Gastrointestinal Symptoms Reported: No symptoms reported   Nutrition Risk Screen (CP): Reduced oral intake over the last month  Genitourinary Genitourinary Symptoms Reported: No symptoms reported    Integumentary Integumentary Symptoms Reported: No symptoms reported    Musculoskeletal Musculoskelatal Symptoms Reviewed: Difficulty walking, Unsteady gait Additional Musculoskeletal Details: uses a cane at home Musculoskeletal Conditions:  Osteoarthritis, Rheumatoid arthritis Musculoskeletal Management Strategies: Medical device, Medication therapy, Routine screening Musculoskeletal Self-Management Outcome: 3 (uncertain) Falls in the past year?: No Patient at Risk for Falls Due to: Impaired balance/gait, Impaired mobility, Mental status change  Psychosocial              08/08/2022    9:33 AM  Depression screen PHQ 2/9  Decreased Interest 0  Down, Depressed, Hopeless 0  PHQ - 2 Score 0    There were no vitals filed for this visit.  Medications Reviewed Today     Reviewed by [name] Crow, RN (Registered Nurse) on 12/17/23 at 1041  Med List Status: <None>   Medication Order Taking? Sig Documenting Provider Last Dose Status Informant  acetaminophen  (TYLENOL ) 325 MG tablet [id number] No Take 650 mg by mouth as needed for headache. [provider] Taking Active Child, Pharmacy Records           Med Note [name] [name]   Tue Dec 30, 2022  9:43 AM) prn  benazepril  (LOTENSIN ) 5 MG tablet [id number] No TAKE 2 TABLETS (10MG  TOTAL) BY MOUTH EVERY DAY [name] Frederic, MD Taking Active   donepezil  (ARICEPT  ODT) 5 MG disintegrating tablet [id number] No Take 1 tablet (5 mg total) by mouth at bedtime. [name] Frederic, MD Taking Active   folic acid  (FOLVITE ) 1 MG tablet [id number] No Take 1 mg by mouth daily. [provider] Taking Active   leflunomide  (ARAVA ) 10 MG tablet [id number] No TAKE 1 TABLET BY MOUTH EVERY DAY [name] Clubs, PA-C Taking Active   levothyroxine  (SYNTHROID ) 88 MCG tablet [id number] No TAKE 1 TABLET BY MOUTH EVERY DAY BEFORE BREAKFAST [name] Mars, MD Taking Active   megestrol  ([name] ) 400 MG/10ML suspension [id number] No Take 10 mLs (400 mg total) by mouth daily. [name] Mars, MD Taking Active  Med Note [name] [name], [name] [name]   Thu Nov 12, 2023 10:08 AM) Son reports patient often not taking regularly due to taste  RN suggested childrens flavoring  Son will contact pharmacy   Multiple Vitamins-Minerals (WOMENS MULTIVITAMIN PO) [id number] No Take by mouth daily. [provider] Taking Active   senna-docusate (SENOKOT-S) 8.6-50 MG tablet [id number] No Take 1 tablet by mouth 2 (two) times daily. [name] Leather, MD Taking Active            Med Note [name] [name]   Tue Dec 30, 2022  9:44 AM) prn            Recommendation:   PCP Follow-up  Follow Up Plan:   Telephone follow up appointment date/time:  01/18/24 at 10:00   [name] [name] BSN RN CCM [place]  [facility], [facility] RN Care Manager Direct Dial: [phone] Fax: [phone]

## 2023-12-17 NOTE — Patient Instructions (Signed)
 Visit Information  Thank you for taking time to visit with me today. Please don't hesitate to contact me if I can be of assistance to you before our next scheduled appointment.  Our next appointment is by telephone on 01/18/24 at 10:00 Please call the care guide team at [phone] if you need to cancel or reschedule your appointment.   Following is a copy of your care plan:   Goals Addressed             This Visit's Progress    [facility]BCI RN Care Plan   On track    Problems:  Chronic Disease Management support and education needs related to Alzheimer's dementia Cognitive Deficits.  Son reports patient frequently not taking [name]  due to the taste.  Also patient misses dosage due to needing to take at various times during the day.   Patient lives alone, children visit as often as possible, are seeking ALF for future, but patient is at risk for injury due to medication errors, fall risk, failure to thrive.  Goal: Over the next 30 days the Patient will attend all scheduled medical appointments: 12/22/23 PCP  as evidenced by chart report and caregiver report        demonstrate Improved and Ongoing adherence to prescribed treatment plan for Alzheimer's dementia as evidenced by attending all medical appointments, taking all medication as prescribed    Interventions: Message sent to Clinical Pharmacist to discuss changing medication times to all pills at one time of day, possible flavoring add to [name]  to improve patient compliance. Discussed [facility] in [facility] provided contact info to patient son to check into as care option. Plan: Next PCP appointment scheduled for: 12/22/23 Next [facility] telephone appointment scheduled for 01/18/24 at 10:00             Please call the Suicide and Crisis Lifeline: 988 call the [place]  National Suicide Prevention Lifeline: 2726095475 or TTY: 508-749-8281 TTY 709 318 4067) to talk to a trained counselor call 1-800-273-TALK (toll free, 24 hour hotline)  if you are experiencing a Mental Health or Behavioral Health Crisis or need someone to talk to.  Patient verbalizes understanding of instructions and care plan provided today and agrees to view in MyChart. Active MyChart status and patient understanding of how to access instructions and care plan via MyChart confirmed with patient.      Clarnce Crow BSN RN CCM [place]  [facility], [facility] RN Care Manager Direct Dial: [phone] Fax: [phone]

## 2023-12-22 ENCOUNTER — Ambulatory Visit (INDEPENDENT_AMBULATORY_CARE_PROVIDER_SITE_OTHER): Payer: Medicare HMO | Admitting: Family Medicine

## 2023-12-22 ENCOUNTER — Encounter: Payer: Self-pay | Admitting: Family Medicine

## 2023-12-22 VITALS — BP 124/52 | HR 88 | Temp 97.5°F | Ht 60.0 in | Wt 126.4 lb

## 2023-12-22 DIAGNOSIS — E039 Hypothyroidism, unspecified: Secondary | ICD-10-CM | POA: Diagnosis not present

## 2023-12-22 DIAGNOSIS — R4189 Other symptoms and signs involving cognitive functions and awareness: Secondary | ICD-10-CM | POA: Diagnosis not present

## 2023-12-22 DIAGNOSIS — N1832 Chronic kidney disease, stage 3b: Secondary | ICD-10-CM

## 2023-12-22 NOTE — Progress Notes (Signed)
 Established Patient Office Visit   Subjective:  Patient ID: [name] [name], female    DOB: [birth year]  Age: 89 y.o. MRN: [id number]  Chief Complaint  Patient presents with   Medical Management of Chronic Issues    3 month follow up. Pt is fasting.     HPI No diagnosis found. Here with her son [name] [name] today.  Doing okay.  Pill dispenser is helping compliance a great deal.  She did see the nephrologist who thought it best to go ahead and discontinue the Jardiance  secondary to dehydration.  She is having no issues taking the Aricept .  Denies nausea or diarrhea.   Review of Systems  Constitutional: Negative.   HENT: Negative.    Eyes:  Negative for blurred vision, discharge and redness.  Respiratory: Negative.    Cardiovascular: Negative.   Gastrointestinal:  Negative for abdominal pain.  Genitourinary: Negative.   Musculoskeletal: Negative.  Negative for myalgias.  Skin:  Negative for rash.  Neurological:  Negative for tingling, loss of consciousness and weakness.  Endo/Heme/Allergies:  Negative for polydipsia.  Psychiatric/Behavioral:  Positive for memory loss.      Current Outpatient Medications:    acetaminophen  (TYLENOL ) 325 MG tablet, Take 650 mg by mouth as needed for headache., Disp: , Rfl:    benazepril  (LOTENSIN ) 5 MG tablet, TAKE 2 TABLETS (10MG  TOTAL) BY MOUTH EVERY DAY, Disp: 180 tablet, Rfl: 1   donepezil  (ARICEPT  ODT) 5 MG disintegrating tablet, Take 1 tablet (5 mg total) by mouth at bedtime., Disp: 90 tablet, Rfl: 1   folic acid  (FOLVITE ) 1 MG tablet, Take 1 mg by mouth daily., Disp: , Rfl:    leflunomide  (ARAVA ) 10 MG tablet, TAKE 1 TABLET BY MOUTH EVERY DAY, Disp: 90 tablet, Rfl: 0   levothyroxine  (SYNTHROID ) 88 MCG tablet, TAKE 1 TABLET BY MOUTH EVERY DAY BEFORE BREAKFAST, Disp: 90 tablet, Rfl: 1   megestrol  ([name] ) 400 MG/10ML suspension, Take 10 mLs (400 mg total) by mouth daily., Disp: 488 mL, Rfl: 3   Multiple Vitamins-Minerals (WOMENS MULTIVITAMIN PO),  Take by mouth daily., Disp: , Rfl:    senna-docusate (SENOKOT-S) 8.6-50 MG tablet, Take 1 tablet by mouth 2 (two) times daily., Disp: 30 tablet, Rfl: 0   Objective:     BP (!) 124/52 (Cuff Size: Normal)   Pulse 88   Temp (!) 97.5 F (36.4 C) (Temporal)   Ht 5' (1.524 m)   Wt 126 lb 6.4 oz (57.3 kg)   SpO2 98%   BMI 24.69 kg/m  BP Readings from Last 3 Encounters:  12/22/23 (!) 124/52  11/16/23 (!) 138/35  10/15/23 113/65   Wt Readings from Last 3 Encounters:  12/22/23 126 lb 6.4 oz (57.3 kg)  11/16/23 124 lb (56.2 kg)  11/12/23 126 lb (57.2 kg)      Physical Exam Constitutional:      General: She is not in acute distress.    Appearance: Normal appearance. She is not ill-appearing, toxic-appearing or diaphoretic.  HENT:     Head: Normocephalic and atraumatic.     Right Ear: External ear normal.     Left Ear: External ear normal.  Eyes:     General: No scleral icterus.       Right eye: No discharge.        Left eye: No discharge.     Extraocular Movements: Extraocular movements intact.     Conjunctiva/sclera: Conjunctivae normal.  Pulmonary:     Effort: Pulmonary effort is normal. No respiratory distress.  Skin:    General: Skin is warm and dry.  Neurological:     Mental Status: She is alert and oriented to person, place, and time.  Psychiatric:        Mood and Affect: Mood normal.        Behavior: Behavior normal.      No results found for any visits on 12/22/23.    The ASCVD Risk score (Arnett DK, et al., 2019) failed to calculate for the following reasons:   The 2019 ASCVD risk score is only valid for ages 4 to 26    Assessment & Plan:   There are no diagnoses linked to this encounter.  Return in about 3 months (around 03/23/2024).  Continue current medications.  Will check labs in August.  Will check B12.  Tonna Frederic, MD

## 2023-12-25 ENCOUNTER — Inpatient Hospital Stay: Attending: Hematology & Oncology

## 2023-12-25 ENCOUNTER — Encounter: Payer: Self-pay | Admitting: Hematology & Oncology

## 2023-12-25 ENCOUNTER — Telehealth: Payer: Self-pay | Admitting: *Deleted

## 2023-12-25 ENCOUNTER — Inpatient Hospital Stay (HOSPITAL_BASED_OUTPATIENT_CLINIC_OR_DEPARTMENT_OTHER): Admitting: Hematology & Oncology

## 2023-12-25 VITALS — BP 140/43 | HR 81 | Temp 98.2°F | Resp 20 | Ht 60.0 in | Wt 129.1 lb

## 2023-12-25 DIAGNOSIS — M0579 Rheumatoid arthritis with rheumatoid factor of multiple sites without organ or systems involvement: Secondary | ICD-10-CM

## 2023-12-25 DIAGNOSIS — D701 Agranulocytosis secondary to cancer chemotherapy: Secondary | ICD-10-CM

## 2023-12-25 DIAGNOSIS — C91Z0 Other lymphoid leukemia not having achieved remission: Secondary | ICD-10-CM | POA: Insufficient documentation

## 2023-12-25 DIAGNOSIS — M069 Rheumatoid arthritis, unspecified: Secondary | ICD-10-CM | POA: Insufficient documentation

## 2023-12-25 LAB — CBC WITH DIFFERENTIAL (CANCER CENTER ONLY)
Abs Immature Granulocytes: 0 10*3/uL (ref 0.00–0.07)
Basophils Absolute: 0.1 10*3/uL (ref 0.0–0.1)
Basophils Relative: 0 %
Eosinophils Absolute: 0 10*3/uL (ref 0.0–0.5)
Eosinophils Relative: 0 %
HCT: 34.9 % — ABNORMAL LOW (ref 36.0–46.0)
Hemoglobin: 10.9 g/dL — ABNORMAL LOW (ref 12.0–15.0)
Immature Granulocytes: 0 %
Lymphocytes Relative: 92 %
Lymphs Abs: 15.5 10*3/uL — ABNORMAL HIGH (ref 0.7–4.0)
MCH: 25.6 pg — ABNORMAL LOW (ref 26.0–34.0)
MCHC: 31.2 g/dL (ref 30.0–36.0)
MCV: 82.1 fL (ref 80.0–100.0)
Monocytes Absolute: 0.9 10*3/uL (ref 0.1–1.0)
Monocytes Relative: 5 %
Neutro Abs: 0.4 10*3/uL — CL (ref 1.7–7.7)
Neutrophils Relative %: 3 %
Platelet Count: 193 10*3/uL (ref 150–400)
RBC: 4.25 MIL/uL (ref 3.87–5.11)
RDW: 16.7 % — ABNORMAL HIGH (ref 11.5–15.5)
Smear Review: NORMAL
WBC Count: 16.9 10*3/uL — ABNORMAL HIGH (ref 4.0–10.5)
nRBC: 0 % (ref 0.0–0.2)

## 2023-12-25 LAB — CMP (CANCER CENTER ONLY)
ALT: 13 U/L (ref 0–44)
AST: 18 U/L (ref 15–41)
Albumin: 4 g/dL (ref 3.5–5.0)
Alkaline Phosphatase: 72 U/L (ref 38–126)
Anion gap: 8 (ref 5–15)
BUN: 23 mg/dL (ref 8–23)
CO2: 24 mmol/L (ref 22–32)
Calcium: 9.1 mg/dL (ref 8.9–10.3)
Chloride: 109 mmol/L (ref 98–111)
Creatinine: 1.37 mg/dL — ABNORMAL HIGH (ref 0.44–1.00)
GFR, Estimated: 38 mL/min — ABNORMAL LOW (ref >60)
Glucose, Bld: 103 mg/dL — ABNORMAL HIGH (ref 70–99)
Potassium: 4.6 mmol/L (ref 3.5–5.1)
Sodium: 141 mmol/L (ref 135–145)
Total Bilirubin: 0.5 mg/dL (ref 0.0–1.2)
Total Protein: 7.2 g/dL (ref 6.5–8.1)

## 2023-12-25 LAB — SAVE SMEAR(SSMR), FOR PROVIDER SLIDE REVIEW

## 2023-12-25 NOTE — Progress Notes (Signed)
 Hematology and Oncology Follow Up Visit  [name] [name] [id number] 11-29-1934 89 y.o. 12/25/2023   Principle Diagnosis:  Large granular lymphocytic leukemia Rheumatoid arthritis   Current Therapy:        Methotrexate  20 mg p.o. weekly- start on 07/27/2021 - stopped 11/07/2022 d/t pancytopenia  GM-CSF/G-CSF q 2-3 week   Interim History:  [name] [name] is here today with her son for follow-up.  Tomorrow is her birthday.  I am so happy for her.  She will be 89 years old.  It sounds like there will be a very nice birthday party for her.  I think she is eating better.  The [name]  is helping her.  She has gained 5 pounds..  She actually does look little bit better.  I know that she does have Alzheimer's.  However, everything looks to be relatively stable.  She has had no issues with nausea or vomiting.  There is is no change in bowel or bladder habits.  She has had no bleeding.  There is been no leg swelling.  She has had no fever.  Overall, I would say her performance status is probably ECOG 2.     Medications:  Allergies as of 12/25/2023       Reactions   Tramadol  Nausea Only   Codeine Phosphate Nausea Only        Medication List        Accurate as of Dec 25, 2023 12:25 PM. If you have any questions, ask your nurse or doctor.          acetaminophen  325 MG tablet Commonly known as: TYLENOL  Take 650 mg by mouth as needed for headache.   benazepril  5 MG tablet Commonly known as: LOTENSIN  TAKE 2 TABLETS (10MG  TOTAL) BY MOUTH EVERY DAY   donepezil  5 MG disintegrating tablet Commonly known as: ARICEPT  ODT Take 1 tablet (5 mg total) by mouth at bedtime.   folic acid  1 MG tablet Commonly known as: FOLVITE  Take 1 mg by mouth daily.   leflunomide  10 MG tablet Commonly known as: ARAVA  TAKE 1 TABLET BY MOUTH EVERY DAY   levothyroxine  88 MCG tablet Commonly known as: SYNTHROID  TAKE 1 TABLET BY MOUTH EVERY DAY BEFORE BREAKFAST   megestrol  400 MG/10ML  suspension Commonly known as: [name]  Take 10 mLs (400 mg total) by mouth daily.   senna-docusate 8.6-50 MG tablet Commonly known as: Senokot-S Take 1 tablet by mouth 2 (two) times daily.   WOMENS MULTIVITAMIN PO Take by mouth daily.        Allergies:  Allergies  Allergen Reactions   Tramadol  Nausea Only   Codeine Phosphate Nausea Only    Past Medical History, Surgical history, Social history, and Family History were reviewed and updated.  Review of Systems: Review of Systems  Constitutional: Negative.   HENT: Negative.    Eyes: Negative.   Respiratory: Negative.    Cardiovascular: Negative.   Gastrointestinal: Negative.   Genitourinary: Negative.   Musculoskeletal: Negative.   Skin: Negative.   Neurological: Negative.   Endo/Heme/Allergies: Negative.   Psychiatric/Behavioral: Negative.       Physical Exam: Temperature is 98.2.  Pulse 81.  Blood pressure 140/43.  Weight is 129 pounds.  .    Wt Readings from Last 3 Encounters:  12/25/23 129 lb 1.3 oz (58.6 kg)  12/22/23 126 lb 6.4 oz (57.3 kg)  11/16/23 124 lb (56.2 kg)   Physical Exam Vitals reviewed.  HENT:     Head: Normocephalic and atraumatic.  Eyes:  Pupils: Pupils are equal, round, and reactive to light.  Cardiovascular:     Rate and Rhythm: Normal rate and regular rhythm.     Heart sounds: Normal heart sounds.  Pulmonary:     Effort: Pulmonary effort is normal.     Breath sounds: Normal breath sounds.  Abdominal:     General: Bowel sounds are normal.     Palpations: Abdomen is soft.  Musculoskeletal:        General: No tenderness or deformity. Normal range of motion.     Cervical back: Normal range of motion.  Lymphadenopathy:     Cervical: No cervical adenopathy.  Skin:    General: Skin is warm and dry.     Findings: No erythema or rash.  Neurological:     Mental Status: She is alert and oriented to person, place, and time.  Psychiatric:        Behavior: Behavior normal.         Thought Content: Thought content normal.        Judgment: Judgment normal.     Lab Results  Component Value Date   WBC 16.9 (H) 12/25/2023   HGB 10.9 (L) 12/25/2023   HCT 34.9 (L) 12/25/2023   MCV 82.1 12/25/2023   PLT 193 12/25/2023   Lab Results  Component Value Date   FERRITIN 266 09/25/2022   IRON 28 09/25/2022   TIBC 193 (L) 09/25/2022   UIBC 165 09/25/2022   IRONPCTSAT 15 09/25/2022   Lab Results  Component Value Date   RETICCTPCT 1.6 09/25/2022   RBC 4.25 12/25/2023   No results found for: "KPAFRELGTCHN", "LAMBDASER", "KAPLAMBRATIO" Lab Results  Component Value Date   IGGSERUM 1,266 07/15/2021   IGA 424 (H) 07/15/2021   IGMSERUM 194 07/15/2021   No results found for: "TOTALPROTELP", "ALBUMINELP", "A1GS", "A2GS", "BETS", "BETA2SER", "GAMS", "MSPIKE", "SPEI"   Chemistry      Component Value Date/Time   NA 141 11/16/2023 1013   NA 141 02/03/2019 1129   K 5.3 (H) 11/16/2023 1013   CL 109 11/16/2023 1013   CO2 23 11/16/2023 1013   BUN 28 (H) 11/16/2023 1013   BUN 31 (H) 02/03/2019 1129   CREATININE 1.37 (H) 11/16/2023 1013   CREATININE 1.00 (H) 05/20/2021 1545      Component Value Date/Time   CALCIUM  9.5 11/16/2023 1013   ALKPHOS 78 11/16/2023 1013   AST 25 11/16/2023 1013   ALT 17 11/16/2023 1013   BILITOT 0.4 11/16/2023 1013       Impression and Plan: [name] [name] is a very pleasant 89 yo caucasian female with history of both Large granular lymphocytic leukemia and RA.   I noticed that the white cell count was going up.  I think this is probably from the [name]  that she is taking.  I would still like to get her back in about 6 weeks or so.  I am just happy that she has gained weight.  I know that she will have a wonderful birthday.  Hopefully she will remember some of it.   [name] Mars, MD 5/23/202512:25 PM

## 2023-12-25 NOTE — Telephone Encounter (Signed)
Dr. Marin Olp notified of ANC-0.4.  No new orders received at this time.

## 2024-01-05 ENCOUNTER — Telehealth: Payer: Self-pay

## 2024-01-05 NOTE — Progress Notes (Signed)
 Complex Care Management Care Guide Note  01/05/2024 Name: DARCUS EDDS MRN: [id number] DOB: [birth year]  [name] [name] is a 89 y.o. year old female who is a primary care patient of Tonna Frederic, MD and is actively engaged with the care management team. I reached out to Jahnyla M Lovern by phone today to assist with re-scheduling  with the RN Case Manager.  Follow up plan: Telephone appointment with complex care management team member scheduled for:  01/19/24 at 10:30 a.m.   [facility]  [facility], [facility] Care Management Assistant Direct Dial: [phone]  Fax: [phone]

## 2024-01-18 ENCOUNTER — Telehealth

## 2024-01-18 NOTE — Progress Notes (Signed)
   01/18/2024  Patient ID: [name] [name], female   DOB: 20-Jan-1935, 89 y.o.   MRN: [id number]  This patient is appearing on a report for being at risk of failing the adherence measure for diabetes medications this calendar year.   Medication: Jardiance  10mg  Last fill date: 10/13/2023 for 30 day supply  Medication discontinued by nephrology due to dehydration concerns.  Linn Rich, PharmD, DPLA

## 2024-01-19 ENCOUNTER — Telehealth: Payer: Self-pay

## 2024-02-03 ENCOUNTER — Inpatient Hospital Stay

## 2024-02-03 ENCOUNTER — Other Ambulatory Visit: Payer: Self-pay

## 2024-02-03 ENCOUNTER — Encounter: Payer: Self-pay | Admitting: Hematology & Oncology

## 2024-02-03 ENCOUNTER — Inpatient Hospital Stay: Attending: Hematology & Oncology | Admitting: Hematology & Oncology

## 2024-02-03 VITALS — BP 153/49 | HR 98 | Temp 98.2°F | Resp 16 | Ht 60.0 in | Wt 121.0 lb

## 2024-02-03 DIAGNOSIS — M0579 Rheumatoid arthritis with rheumatoid factor of multiple sites without organ or systems involvement: Secondary | ICD-10-CM

## 2024-02-03 DIAGNOSIS — M069 Rheumatoid arthritis, unspecified: Secondary | ICD-10-CM | POA: Diagnosis not present

## 2024-02-03 DIAGNOSIS — Z79899 Other long term (current) drug therapy: Secondary | ICD-10-CM | POA: Insufficient documentation

## 2024-02-03 DIAGNOSIS — D709 Neutropenia, unspecified: Secondary | ICD-10-CM | POA: Diagnosis not present

## 2024-02-03 DIAGNOSIS — C91Z0 Other lymphoid leukemia not having achieved remission: Secondary | ICD-10-CM | POA: Insufficient documentation

## 2024-02-03 DIAGNOSIS — F0390 Unspecified dementia without behavioral disturbance: Secondary | ICD-10-CM | POA: Diagnosis not present

## 2024-02-03 LAB — CBC WITH DIFFERENTIAL (CANCER CENTER ONLY)
Abs Immature Granulocytes: 0 10*3/uL (ref 0.00–0.07)
Basophils Absolute: 0.1 10*3/uL (ref 0.0–0.1)
Basophils Relative: 0 %
Eosinophils Absolute: 0 10*3/uL (ref 0.0–0.5)
Eosinophils Relative: 0 %
HCT: 34.1 % — ABNORMAL LOW (ref 36.0–46.0)
Hemoglobin: 10.4 g/dL — ABNORMAL LOW (ref 12.0–15.0)
Immature Granulocytes: 0 %
Lymphocytes Relative: 92 %
Lymphs Abs: 14 10*3/uL — ABNORMAL HIGH (ref 0.7–4.0)
MCH: 25.3 pg — ABNORMAL LOW (ref 26.0–34.0)
MCHC: 30.5 g/dL (ref 30.0–36.0)
MCV: 83 fL (ref 80.0–100.0)
Monocytes Absolute: 1 10*3/uL (ref 0.1–1.0)
Monocytes Relative: 6 %
Neutro Abs: 0.4 10*3/uL — CL (ref 1.7–7.7)
Neutrophils Relative %: 2 %
Platelet Count: 168 10*3/uL (ref 150–400)
RBC: 4.11 MIL/uL (ref 3.87–5.11)
RDW: 15.8 % — ABNORMAL HIGH (ref 11.5–15.5)
Smear Review: NORMAL
WBC Count: 15.4 10*3/uL — ABNORMAL HIGH (ref 4.0–10.5)
nRBC: 0 % (ref 0.0–0.2)

## 2024-02-03 LAB — CMP (CANCER CENTER ONLY)
ALT: 9 U/L (ref 0–44)
AST: 20 U/L (ref 15–41)
Albumin: 3.8 g/dL (ref 3.5–5.0)
Alkaline Phosphatase: 75 U/L (ref 38–126)
Anion gap: 8 (ref 5–15)
BUN: 26 mg/dL — ABNORMAL HIGH (ref 8–23)
CO2: 25 mmol/L (ref 22–32)
Calcium: 9.4 mg/dL (ref 8.9–10.3)
Chloride: 108 mmol/L (ref 98–111)
Creatinine: 1.17 mg/dL — ABNORMAL HIGH (ref 0.44–1.00)
GFR, Estimated: 45 mL/min — ABNORMAL LOW (ref >60)
Glucose, Bld: 93 mg/dL (ref 70–99)
Potassium: 4.5 mmol/L (ref 3.5–5.1)
Sodium: 141 mmol/L (ref 135–145)
Total Bilirubin: 0.5 mg/dL (ref 0.0–1.2)
Total Protein: 7.6 g/dL (ref 6.5–8.1)

## 2024-02-03 LAB — SAVE SMEAR(SSMR), FOR PROVIDER SLIDE REVIEW

## 2024-02-03 NOTE — Progress Notes (Signed)
 Hematology and Oncology Follow Up Visit  [name] [name] [id number] 1935-05-27 89 y.o. 02/03/2024   Principle Diagnosis:  Large granular lymphocytic leukemia Rheumatoid arthritis   Current Therapy:        Methotrexate  20 mg p.o. weekly- start on 07/27/2021 - stopped 11/07/2022 d/t pancytopenia  GM-CSF/G-CSF q 2-3 week   Interim History:  [name] [name] is here today with her son for follow-up.  Unfortunately, she still is not eating.  A lot of this has to do with dementia.  We have given her [name]  to try to help her to eat.  Measures she takes this.  Her weight is down 8 pounds since we last saw her.  She did have a nice birthday.  She had a birthday right after we saw her.  Hopefully, she will have a nice fly for the celebration with her family.  She has had no problem with infections.  She has had no fever.  She has had no rashes.  There has been no change in bowel or bladder habits.  She has had no cough.  She has had no mouth sores.  She does have some chronic leg swelling.  Overall, I would have to say that her performance status is probably ECOG 2-3.   Medications:  Allergies as of 02/03/2024       Reactions   Tramadol  Nausea Only   Codeine Phosphate Nausea Only        Medication List        Accurate as of February 03, 2024  1:30 PM. If you have any questions, ask your nurse or doctor.          acetaminophen  325 MG tablet Commonly known as: TYLENOL  Take 650 mg by mouth as needed for headache.   benazepril  5 MG tablet Commonly known as: LOTENSIN  TAKE 2 TABLETS (10MG  TOTAL) BY MOUTH EVERY DAY   donepezil  5 MG disintegrating tablet Commonly known as: ARICEPT  ODT Take 1 tablet (5 mg total) by mouth at bedtime.   folic acid  1 MG tablet Commonly known as: FOLVITE  Take 1 mg by mouth daily.   leflunomide  10 MG tablet Commonly known as: ARAVA  TAKE 1 TABLET BY MOUTH EVERY DAY   levothyroxine  88 MCG tablet Commonly known as: SYNTHROID  TAKE 1 TABLET BY MOUTH EVERY  DAY BEFORE BREAKFAST   megestrol  400 MG/10ML suspension Commonly known as: [name]  Take 10 mLs (400 mg total) by mouth daily.   senna-docusate 8.6-50 MG tablet Commonly known as: Senokot-S Take 1 tablet by mouth 2 (two) times daily.   WOMENS MULTIVITAMIN PO Take by mouth daily.        Allergies:  Allergies  Allergen Reactions   Tramadol  Nausea Only   Codeine Phosphate Nausea Only    Past Medical History, Surgical history, Social history, and Family History were reviewed and updated.  Review of Systems: Review of Systems  Constitutional: Negative.   HENT: Negative.    Eyes: Negative.   Respiratory: Negative.    Cardiovascular: Negative.   Gastrointestinal: Negative.   Genitourinary: Negative.   Musculoskeletal: Negative.   Skin: Negative.   Neurological: Negative.   Endo/Heme/Allergies: Negative.   Psychiatric/Behavioral: Negative.       Physical Exam: Temperature is 98.2.  Pulse 81.  Blood pressure 140/43.  Weight is 121 pounds.  .    Wt Readings from Last 3 Encounters:  02/03/24 121 lb (54.9 kg)  12/25/23 129 lb 1.3 oz (58.6 kg)  12/22/23 126 lb 6.4 oz (57.3 kg)   Physical  Exam Vitals reviewed.  HENT:     Head: Normocephalic and atraumatic.  Eyes:     Pupils: Pupils are equal, round, and reactive to light.  Cardiovascular:     Rate and Rhythm: Normal rate and regular rhythm.     Heart sounds: Normal heart sounds.  Pulmonary:     Effort: Pulmonary effort is normal.     Breath sounds: Normal breath sounds.  Abdominal:     General: Bowel sounds are normal.     Palpations: Abdomen is soft.  Musculoskeletal:        General: No tenderness or deformity. Normal range of motion.     Cervical back: Normal range of motion.  Lymphadenopathy:     Cervical: No cervical adenopathy.  Skin:    General: Skin is warm and dry.     Findings: No erythema or rash.  Neurological:     Mental Status: She is alert and oriented to person, place, and time.  Psychiatric:         Behavior: Behavior normal.        Thought Content: Thought content normal.        Judgment: Judgment normal.     Lab Results  Component Value Date   WBC 15.4 (H) 02/03/2024   HGB 10.4 (L) 02/03/2024   HCT 34.1 (L) 02/03/2024   MCV 83.0 02/03/2024   PLT 168 02/03/2024   Lab Results  Component Value Date   FERRITIN 266 09/25/2022   IRON 28 09/25/2022   TIBC 193 (L) 09/25/2022   UIBC 165 09/25/2022   IRONPCTSAT 15 09/25/2022   Lab Results  Component Value Date   RETICCTPCT 1.6 09/25/2022   RBC 4.11 02/03/2024   No results found for: JONATHAN BONG [facility] Lab Results  Component Value Date   IGGSERUM 1,266 07/15/2021   IGA 424 (H) 07/15/2021   IGMSERUM 194 07/15/2021   No results found for: STEPHANY CARLOTA BENSON MARKEL EARLA JOANNIE DOC VICK, SPEI   Chemistry      Component Value Date/Time   NA 141 02/03/2024 1210   NA 141 02/03/2019 1129   K 4.5 02/03/2024 1210   CL 108 02/03/2024 1210   CO2 25 02/03/2024 1210   BUN 26 (H) 02/03/2024 1210   BUN 31 (H) 02/03/2019 1129   CREATININE 1.17 (H) 02/03/2024 1210   CREATININE 1.00 (H) 05/20/2021 1545      Component Value Date/Time   CALCIUM  9.4 02/03/2024 1210   ALKPHOS 75 02/03/2024 1210   AST 20 02/03/2024 1210   ALT 9 02/03/2024 1210   BILITOT 0.5 02/03/2024 1210       Impression and Plan: [name] [name] is a very pleasant 89 yo caucasian female with history of both Large granular lymphocytic leukemia and RA.   As expected, she still has marked neutropenia.  This however is stable.  I just think that this is how she always will be.  I do think that her prognosis is clearly based upon her weight.  She has lost quite a bit of weight since we last saw her.  I think if her weight gets much lower, I do not know if her heart will tolerate this.  I would like to see her back in late August.  I would like to get her back before the Labor Day holiday.  [name] JONELLE Crease, MD 7/2/20251:30 PM

## 2024-02-04 ENCOUNTER — Other Ambulatory Visit: Payer: Self-pay | Admitting: Hematology & Oncology

## 2024-02-04 DIAGNOSIS — Z8639 Personal history of other endocrine, nutritional and metabolic disease: Secondary | ICD-10-CM

## 2024-02-12 DIAGNOSIS — H353131 Nonexudative age-related macular degeneration, bilateral, early dry stage: Secondary | ICD-10-CM | POA: Diagnosis not present

## 2024-02-12 DIAGNOSIS — H35373 Puckering of macula, bilateral: Secondary | ICD-10-CM | POA: Diagnosis not present

## 2024-02-12 LAB — HM DIABETES EYE EXAM

## 2024-02-15 ENCOUNTER — Encounter: Payer: Self-pay | Admitting: Family Medicine

## 2024-02-18 ENCOUNTER — Other Ambulatory Visit: Payer: Self-pay | Admitting: Physician Assistant

## 2024-02-18 NOTE — Telephone Encounter (Signed)
 Last Fill: 11/23/2023  Labs: 02/03/2024 WBC 15.4, Hgb 10.4, Hct 34.1, MCH 25.3, RDW 15.8, Neutro Abs 0.4, Lymphs Abs   Next Visit: 03/23/2024  Last Visit: 10/15/2023  DX: Rheumatoid arthritis involving multiple sites with positive rheumatoid factor   Current Dose per office note 10/15/2023: Arava  10 mg 1 tablet daily for rheumatoid arthritis.   Okay to refill Arava  ?

## 2024-02-21 ENCOUNTER — Inpatient Hospital Stay (HOSPITAL_BASED_OUTPATIENT_CLINIC_OR_DEPARTMENT_OTHER)
Admission: EM | Admit: 2024-02-21 | Discharge: 2024-02-26 | DRG: 841 | Disposition: A | Discharge status: Home / Self Care | Attending: Internal Medicine | Admitting: Internal Medicine

## 2024-02-21 ENCOUNTER — Emergency Department (HOSPITAL_BASED_OUTPATIENT_CLINIC_OR_DEPARTMENT_OTHER)

## 2024-02-21 ENCOUNTER — Encounter (HOSPITAL_BASED_OUTPATIENT_CLINIC_OR_DEPARTMENT_OTHER): Payer: Self-pay

## 2024-02-21 ENCOUNTER — Other Ambulatory Visit: Payer: Self-pay

## 2024-02-21 DIAGNOSIS — I471 Supraventricular tachycardia, unspecified: Secondary | ICD-10-CM | POA: Diagnosis present

## 2024-02-21 DIAGNOSIS — C91Z0 Other lymphoid leukemia not having achieved remission: Secondary | ICD-10-CM | POA: Diagnosis not present

## 2024-02-21 DIAGNOSIS — Z8639 Personal history of other endocrine, nutritional and metabolic disease: Secondary | ICD-10-CM

## 2024-02-21 DIAGNOSIS — R509 Fever, unspecified: Principal | ICD-10-CM | POA: Diagnosis present

## 2024-02-21 DIAGNOSIS — E039 Hypothyroidism, unspecified: Secondary | ICD-10-CM | POA: Diagnosis present

## 2024-02-21 DIAGNOSIS — E860 Dehydration: Secondary | ICD-10-CM | POA: Diagnosis present

## 2024-02-21 DIAGNOSIS — Z79890 Hormone replacement therapy (postmenopausal): Secondary | ICD-10-CM

## 2024-02-21 DIAGNOSIS — N1832 Chronic kidney disease, stage 3b: Secondary | ICD-10-CM | POA: Diagnosis present

## 2024-02-21 DIAGNOSIS — I129 Hypertensive chronic kidney disease with stage 1 through stage 4 chronic kidney disease, or unspecified chronic kidney disease: Secondary | ICD-10-CM | POA: Diagnosis present

## 2024-02-21 DIAGNOSIS — D61818 Other pancytopenia: Secondary | ICD-10-CM | POA: Diagnosis present

## 2024-02-21 DIAGNOSIS — Z1152 Encounter for screening for COVID-19: Secondary | ICD-10-CM

## 2024-02-21 DIAGNOSIS — D509 Iron deficiency anemia, unspecified: Secondary | ICD-10-CM | POA: Diagnosis present

## 2024-02-21 DIAGNOSIS — I4719 Other supraventricular tachycardia: Secondary | ICD-10-CM | POA: Diagnosis present

## 2024-02-21 DIAGNOSIS — Z833 Family history of diabetes mellitus: Secondary | ICD-10-CM

## 2024-02-21 DIAGNOSIS — R011 Cardiac murmur, unspecified: Secondary | ICD-10-CM | POA: Diagnosis present

## 2024-02-21 DIAGNOSIS — D849 Immunodeficiency, unspecified: Secondary | ICD-10-CM

## 2024-02-21 DIAGNOSIS — I10 Essential (primary) hypertension: Secondary | ICD-10-CM | POA: Diagnosis present

## 2024-02-21 DIAGNOSIS — D696 Thrombocytopenia, unspecified: Secondary | ICD-10-CM | POA: Diagnosis present

## 2024-02-21 DIAGNOSIS — G309 Alzheimer's disease, unspecified: Secondary | ICD-10-CM | POA: Diagnosis present

## 2024-02-21 DIAGNOSIS — M069 Rheumatoid arthritis, unspecified: Secondary | ICD-10-CM | POA: Diagnosis present

## 2024-02-21 DIAGNOSIS — R059 Cough, unspecified: Secondary | ICD-10-CM | POA: Diagnosis not present

## 2024-02-21 DIAGNOSIS — E785 Hyperlipidemia, unspecified: Secondary | ICD-10-CM | POA: Diagnosis present

## 2024-02-21 DIAGNOSIS — Z66 Do not resuscitate: Secondary | ICD-10-CM | POA: Diagnosis present

## 2024-02-21 DIAGNOSIS — F0280 Dementia in other diseases classified elsewhere without behavioral disturbance: Secondary | ICD-10-CM | POA: Diagnosis present

## 2024-02-21 DIAGNOSIS — D709 Neutropenia, unspecified: Secondary | ICD-10-CM | POA: Diagnosis present

## 2024-02-21 DIAGNOSIS — R0602 Shortness of breath: Secondary | ICD-10-CM | POA: Diagnosis not present

## 2024-02-21 DIAGNOSIS — Z8249 Family history of ischemic heart disease and other diseases of the circulatory system: Secondary | ICD-10-CM

## 2024-02-21 LAB — CBC WITH DIFFERENTIAL/PLATELET
Abs Immature Granulocytes: 0.01 K/uL (ref 0.00–0.07)
Basophils Absolute: 0.1 K/uL (ref 0.0–0.1)
Basophils Relative: 0 %
Eosinophils Absolute: 0 K/uL (ref 0.0–0.5)
Eosinophils Relative: 0 %
HCT: 33.9 % — ABNORMAL LOW (ref 36.0–46.0)
Hemoglobin: 10.5 g/dL — ABNORMAL LOW (ref 12.0–15.0)
Immature Granulocytes: 0 %
Lymphocytes Relative: 86 %
Lymphs Abs: 11.9 K/uL — ABNORMAL HIGH (ref 0.7–4.0)
MCH: 25.6 pg — ABNORMAL LOW (ref 26.0–34.0)
MCHC: 31 g/dL (ref 30.0–36.0)
MCV: 82.7 fL (ref 80.0–100.0)
Monocytes Absolute: 1 K/uL (ref 0.1–1.0)
Monocytes Relative: 7 %
Neutro Abs: 1 K/uL — ABNORMAL LOW (ref 1.7–7.7)
Neutrophils Relative %: 7 %
Platelets: 142 K/uL — ABNORMAL LOW (ref 150–400)
RBC: 4.1 MIL/uL (ref 3.87–5.11)
RDW: 15.4 % (ref 11.5–15.5)
WBC: 14 K/uL — ABNORMAL HIGH (ref 4.0–10.5)
nRBC: 0 % (ref 0.0–0.2)

## 2024-02-21 LAB — COMPREHENSIVE METABOLIC PANEL WITH GFR
ALT: 8 U/L (ref 0–44)
AST: 28 U/L (ref 15–41)
Albumin: 3.6 g/dL (ref 3.5–5.0)
Alkaline Phosphatase: 91 U/L (ref 38–126)
Anion gap: 13 (ref 5–15)
BUN: 21 mg/dL (ref 8–23)
CO2: 21 mmol/L — ABNORMAL LOW (ref 22–32)
Calcium: 8.8 mg/dL — ABNORMAL LOW (ref 8.9–10.3)
Chloride: 103 mmol/L (ref 98–111)
Creatinine, Ser: 1.17 mg/dL — ABNORMAL HIGH (ref 0.44–1.00)
GFR, Estimated: 45 mL/min — ABNORMAL LOW (ref >60)
Glucose, Bld: 103 mg/dL — ABNORMAL HIGH (ref 70–99)
Potassium: 4.3 mmol/L (ref 3.5–5.1)
Sodium: 137 mmol/L (ref 135–145)
Total Bilirubin: 0.5 mg/dL (ref 0.0–1.2)
Total Protein: 7.2 g/dL (ref 6.5–8.1)

## 2024-02-21 LAB — RESP PANEL BY RT-PCR (RSV, FLU A&B, COVID)  RVPGX2
Influenza A by PCR: NEGATIVE
Influenza B by PCR: NEGATIVE
Resp Syncytial Virus by PCR: NEGATIVE
SARS Coronavirus 2 by RT PCR: NEGATIVE

## 2024-02-21 LAB — URINALYSIS, W/ REFLEX TO CULTURE (INFECTION SUSPECTED)
Bilirubin Urine: NEGATIVE
Glucose, UA: NEGATIVE mg/dL
Hgb urine dipstick: NEGATIVE
Ketones, ur: NEGATIVE mg/dL
Leukocytes,Ua: NEGATIVE
Nitrite: NEGATIVE
Protein, ur: 30 mg/dL — AB
Specific Gravity, Urine: 1.018 (ref 1.005–1.030)
pH: 5.5 (ref 5.0–8.0)

## 2024-02-21 LAB — PROTIME-INR
INR: 1 (ref 0.8–1.2)
Prothrombin Time: 14.1 s (ref 11.4–15.2)

## 2024-02-21 LAB — LACTIC ACID, PLASMA: Lactic Acid, Venous: 0.9 mmol/L (ref 0.5–1.9)

## 2024-02-21 MED ORDER — SODIUM CHLORIDE 0.9 % IV BOLUS
250.0000 mL | Freq: Once | INTRAVENOUS | Status: AC
  Administered 2024-02-21: 250 mL via INTRAVENOUS

## 2024-02-21 MED ORDER — SODIUM CHLORIDE 0.9 % IV SOLN
500.0000 mg | Freq: Once | INTRAVENOUS | Status: AC
  Administered 2024-02-21: 500 mg via INTRAVENOUS
  Filled 2024-02-21: qty 5

## 2024-02-21 MED ORDER — VANCOMYCIN HCL IN DEXTROSE 1-5 GM/200ML-% IV SOLN
1000.0000 mg | Freq: Once | INTRAVENOUS | Status: AC
  Administered 2024-02-22: 1000 mg via INTRAVENOUS
  Filled 2024-02-21: qty 200

## 2024-02-21 MED ORDER — SODIUM CHLORIDE 0.9 % IV SOLN
1.0000 g | Freq: Once | INTRAVENOUS | Status: AC
  Administered 2024-02-21: 1 g via INTRAVENOUS
  Filled 2024-02-21 (×2): qty 10

## 2024-02-21 MED ORDER — SODIUM CHLORIDE 0.9 % IV SOLN
2.0000 g | Freq: Once | INTRAVENOUS | Status: AC
  Administered 2024-02-22: 2 g via INTRAVENOUS
  Filled 2024-02-21: qty 12.5

## 2024-02-21 MED ORDER — ACETAMINOPHEN 500 MG PO TABS
1000.0000 mg | ORAL_TABLET | Freq: Once | ORAL | Status: AC
Start: 2024-02-21 — End: 2024-02-21
  Administered 2024-02-21: 1000 mg via ORAL
  Filled 2024-02-21: qty 2

## 2024-02-21 NOTE — Progress Notes (Signed)
 ED Pharmacy Antibiotic Sign Off An antibiotic consult was received from an ED provider for Vancomycin  and Cefepime  per pharmacy dosing for febrile neutropenia. A chart review was completed to assess appropriateness.   The following one time order(s) were placed:  Cefepime  2gm IV and Vancomycin  1gm IV  Further antibiotic and/or antibiotic pharmacy consults should be ordered by the admitting provider if indicated.   Thank you for allowing pharmacy to be a part of this patient's care.   [name] [name], PharmD, BCPS Please see amion for complete clinical pharmacist phone list 02/21/24 11:58 PM

## 2024-02-21 NOTE — ED Provider Notes (Signed)
 [place] EMERGENCY DEPARTMENT AT [facility] Provider Note   CSN: [id number] Arrival date & time: 02/21/24  1619     Patient presents with: Cough   [name] [name] is a 89 y.o. female.  With past medical history of leukemia, rheumatoid arthritis on immunosuppressant, Alzheimer's, hyperlipidemia, hypertension, hypothyroidism presented to the emergency room with complaint of decreased intake, frequent sleeping, fever, cough.  She denies any chest pain, shortness of breath, dysuria, urinary frequency or urgency.    Cough      Prior to Admission medications   Medication Sig Start Date End Date Taking? Authorizing Provider  acetaminophen  (TYLENOL ) 325 MG tablet Take 650 mg by mouth as needed for headache.    [provider]  benazepril  (LOTENSIN ) 5 MG tablet TAKE 2 TABLETS (10MG  TOTAL) BY MOUTH EVERY DAY 08/26/23   [name], MD  donepezil  (ARICEPT  ODT) 5 MG disintegrating tablet Take 1 tablet (5 mg total) by mouth at bedtime. 09/21/23   [name], MD  folic acid  (FOLVITE ) 1 MG tablet Take 1 mg by mouth daily.    [provider]  leflunomide  (ARAVA ) 10 MG tablet TAKE 1 TABLET BY MOUTH EVERY DAY 02/18/24   Cheryl Waddell CHRISTELLA, PA-C  levothyroxine  (SYNTHROID ) 88 MCG tablet TAKE 1 TABLET BY MOUTH EVERY DAY BEFORE BREAKFAST 02/04/24   Timmy Maude SAUNDERS, MD  megestrol  ([name] ) 400 MG/10ML suspension Take 10 mLs (400 mg total) by mouth daily. 05/13/23   Timmy Maude SAUNDERS, MD  Multiple Vitamins-Minerals (WOMENS MULTIVITAMIN PO) Take by mouth daily. Patient not taking: Reported on 12/25/2023    [provider]  senna-docusate (SENOKOT-S) 8.6-50 MG tablet Take 1 tablet by mouth 2 (two) times daily. Patient not taking: Reported on 12/25/2023 11/07/22   Cheryle Page, MD    Allergies: Tramadol  and Codeine phosphate    Review of Systems  Respiratory:  Positive for cough.     Updated Vital Signs BP (!) 130/57   Pulse 92   Temp 99.3 F (37.4  C) (Oral)   Resp 19   SpO2 98%   Physical Exam Vitals and nursing note reviewed.  Constitutional:      General: She is not in acute distress.    Appearance: She is not toxic-appearing.  HENT:     Head: Normocephalic and atraumatic.  Eyes:     General: No scleral icterus.    Conjunctiva/sclera: Conjunctivae normal.  Cardiovascular:     Rate and Rhythm: Regular rhythm. Tachycardia present.     Pulses: Normal pulses.     Heart sounds: Normal heart sounds.  Pulmonary:     Effort: Pulmonary effort is normal. No respiratory distress.     Breath sounds: Normal breath sounds.  Abdominal:     General: Abdomen is flat. Bowel sounds are normal.     Palpations: Abdomen is soft.     Tenderness: There is no abdominal tenderness.     Comments: No abdominal tenderness to palpation.  Abdomen soft and nondistended.  Musculoskeletal:     Right lower leg: No edema.     Left lower leg: No edema.  Skin:    General: Skin is warm and dry.     Findings: No lesion.  Neurological:     General: No focal deficit present.     Mental Status: She is alert and oriented to person, place, and time. Mental status is at baseline.     Comments: No focal neurological deficits.     (all labs ordered are listed,  but only abnormal results are displayed) Labs Reviewed  COMPREHENSIVE METABOLIC PANEL WITH GFR - Abnormal; Notable for the following components:      Result Value   CO2 21 (*)    Glucose, Bld 103 (*)    Creatinine, Ser 1.17 (*)    Calcium  8.8 (*)    GFR, Estimated 45 (*)    All other components within normal limits  CBC WITH DIFFERENTIAL/PLATELET - Abnormal; Notable for the following components:   WBC 14.0 (*)    Hemoglobin 10.5 (*)    HCT 33.9 (*)    MCH 25.6 (*)    Platelets 142 (*)    Neutro Abs 1.0 (*)    Lymphs Abs 11.9 (*)    All other components within normal limits  RESP PANEL BY RT-PCR (RSV, FLU A&B, COVID)  RVPGX2  CULTURE, BLOOD (ROUTINE X 2)  CULTURE, BLOOD (ROUTINE X 2)   LACTIC ACID, PLASMA  PROTIME-INR  LACTIC ACID, PLASMA  URINALYSIS, W/ REFLEX TO CULTURE (INFECTION SUSPECTED)    EKG: None  Radiology: DG Chest Portable 1 View Result Date: 02/21/2024 CLINICAL DATA:  Short of breath.  Cough. EXAM: PORTABLE CHEST 1 VIEW COMPARISON:  10/31/2022 and older exams. FINDINGS: Cardiac silhouette is normal in size. No mediastinal or hilar masses. Lungs are hyperexpanded, but clear. No pleural effusion or pneumothorax. Skeletal structures are demineralized, grossly intact. IMPRESSION: No active disease. Electronically Signed   By: Alm Parkins M.D.   On: 02/21/2024 17:48     Procedures   Medications Ordered in the ED  cefTRIAXone  (ROCEPHIN ) 1 g in sodium chloride  0.9 % 100 mL IVPB (0 g Intravenous Stopped 02/21/24 1917)  azithromycin  (ZITHROMAX ) 500 mg in sodium chloride  0.9 % 250 mL IVPB (0 mg Intravenous Stopped 02/21/24 1917)  acetaminophen  (TYLENOL ) tablet 1,000 mg (1,000 mg Oral Given 02/21/24 1729)  sodium chloride  0.9 % bolus 250 mL (0 mLs Intravenous Stopped 02/21/24 2052)                                    Medical Decision Making Amount and/or Complexity of Data Reviewed Labs: ordered. Radiology: ordered.  Risk OTC drugs. Decision regarding hospitalization.   This patient presents to the ED for concern of cough/fever, this involves an extensive number of treatment options, and is a complaint that carries with it a high risk of complications and morbidity.  The differential diagnosis includes sepsis, pneumonia, viral URI, PE, ACS, urinary tract infection, electrolyte abnormality   Co morbidities that complicate the patient evaluation  leukemia, rheumatoid arthritis on immunosuppressant, Alzheimer's, hyperlipidemia, hypertension, hypothyroidism  Additional history obtained:  Additional history obtained from recent office visit 02/03/2024 with oncology   Lab Tests:  I personally interpreted labs.  The pertinent results include:   CBC  shows leukocytosis, absolute neutrophil count is 1 (elevated since prior recent labs - 0.4)  Imaging Studies ordered:  I ordered imaging studies including chest x-ray I independently visualized and interpreted imaging which showed no acute findings I agree with the radiologist interpretation   Cardiac Monitoring: / EKG:  The patient was maintained on a cardiac monitor.     Consultations Obtained:  I requested consultation with the hospitalist,  and discussed lab and imaging findings as well as pertinent plan - they recommend: Adding vancomycin  and cefepime .  I have ordered this.  They put in admission orders.   Problem List / ED Course / Critical interventions /  Medication management  Patient presents to emergency room with several days of cough, poor oral intake and fever.  She does have history of leukemia and rheumatoid arthritis on immunosuppressants.  She is followed by Dr. Timmy.  Today she reports tachycardic and has fever.  Her lungs are clear to auscultation bilaterally and she denies chest pain or significant shortness of breath.  Given meeting SIRS criteria she was given fluids and antibiotics with suspected respiratory source.  She does have leukocytosis and moderate neutropenia -given neutropenia and febrile illness feel she needs to be brought into the hospital.  There is no respiratory infection identified, normal lactic, normal chest x-ray.  Blood cultures are pending. I have reviewed the patients home medicines and have made adjustments as needed     Final diagnoses:  Fever, unspecified fever cause    ED Discharge Orders     None          Shermon Warren SAILOR, PA-C 02/21/24 2356    Horton, Roxie HERO, DO 02/22/24 0023

## 2024-02-21 NOTE — Plan of Care (Signed)
 [facility] emergency department to [facility] transfer:  89 year old female past medical history of leukemia on leflunomide , dementia, chronic poor appetite and weight loss on megestrol , essential hypertension and hypothyroidism present emergency department complaining of cough, shortness of breath and decreased p.o. intake for a while.  Presentation to ED found febrile otherwise hemodynamically stable. Respiratory panel negative for COVID, RSV/flu. CBC showing leukocytosis 14 which is around baseline (history  of leukemia), stable H&H and platelet count 142. At the baseline patient neutrophil count is around 0.4-0.5 today it is around 1.  CMP showed low bicarb 21, creatinine 1.17 renal function at baseline. Normal lactic acid level. Blood cultures are in process.  Pending UA.  Chest x-ray no active disease process.   The ED patient has been empirically treated with azithromycin  and ceftriaxone  but patient does not have any evidence of pneumonia. ED physician requested admission for neutropenic fever and unknown source of infection at this time.  Informed to give her IV Vanco and cefepime  one-time dose.  Admitting patient for neutropenic fever.

## 2024-02-21 NOTE — ED Triage Notes (Signed)
 Pt w son/ POA- c/o really bad cough, SHOB, poor PO intake x 'awhile,' sleeping a lot recently.   Hx leukemia, seen by [employer]

## 2024-02-22 ENCOUNTER — Inpatient Hospital Stay (HOSPITAL_COMMUNITY)

## 2024-02-22 DIAGNOSIS — E860 Dehydration: Secondary | ICD-10-CM | POA: Diagnosis not present

## 2024-02-22 DIAGNOSIS — D509 Iron deficiency anemia, unspecified: Secondary | ICD-10-CM | POA: Diagnosis not present

## 2024-02-22 DIAGNOSIS — J479 Bronchiectasis, uncomplicated: Secondary | ICD-10-CM | POA: Diagnosis not present

## 2024-02-22 DIAGNOSIS — I129 Hypertensive chronic kidney disease with stage 1 through stage 4 chronic kidney disease, or unspecified chronic kidney disease: Secondary | ICD-10-CM | POA: Diagnosis not present

## 2024-02-22 DIAGNOSIS — Z8249 Family history of ischemic heart disease and other diseases of the circulatory system: Secondary | ICD-10-CM | POA: Diagnosis not present

## 2024-02-22 DIAGNOSIS — R011 Cardiac murmur, unspecified: Secondary | ICD-10-CM | POA: Insufficient documentation

## 2024-02-22 DIAGNOSIS — R5081 Fever presenting with conditions classified elsewhere: Secondary | ICD-10-CM

## 2024-02-22 DIAGNOSIS — I4719 Other supraventricular tachycardia: Secondary | ICD-10-CM | POA: Diagnosis not present

## 2024-02-22 DIAGNOSIS — N1832 Chronic kidney disease, stage 3b: Secondary | ICD-10-CM

## 2024-02-22 DIAGNOSIS — R509 Fever, unspecified: Secondary | ICD-10-CM | POA: Diagnosis not present

## 2024-02-22 DIAGNOSIS — D649 Anemia, unspecified: Secondary | ICD-10-CM | POA: Diagnosis not present

## 2024-02-22 DIAGNOSIS — I10 Essential (primary) hypertension: Secondary | ICD-10-CM

## 2024-02-22 DIAGNOSIS — Z79890 Hormone replacement therapy (postmenopausal): Secondary | ICD-10-CM | POA: Diagnosis not present

## 2024-02-22 DIAGNOSIS — M0579 Rheumatoid arthritis with rheumatoid factor of multiple sites without organ or systems involvement: Secondary | ICD-10-CM | POA: Diagnosis not present

## 2024-02-22 DIAGNOSIS — Z833 Family history of diabetes mellitus: Secondary | ICD-10-CM | POA: Diagnosis not present

## 2024-02-22 DIAGNOSIS — F0280 Dementia in other diseases classified elsewhere without behavioral disturbance: Secondary | ICD-10-CM | POA: Diagnosis not present

## 2024-02-22 DIAGNOSIS — D709 Neutropenia, unspecified: Secondary | ICD-10-CM | POA: Diagnosis not present

## 2024-02-22 DIAGNOSIS — M069 Rheumatoid arthritis, unspecified: Secondary | ICD-10-CM | POA: Diagnosis not present

## 2024-02-22 DIAGNOSIS — E785 Hyperlipidemia, unspecified: Secondary | ICD-10-CM | POA: Diagnosis not present

## 2024-02-22 DIAGNOSIS — J9811 Atelectasis: Secondary | ICD-10-CM | POA: Diagnosis not present

## 2024-02-22 DIAGNOSIS — A419 Sepsis, unspecified organism: Secondary | ICD-10-CM | POA: Diagnosis not present

## 2024-02-22 DIAGNOSIS — D696 Thrombocytopenia, unspecified: Secondary | ICD-10-CM | POA: Diagnosis not present

## 2024-02-22 DIAGNOSIS — E039 Hypothyroidism, unspecified: Secondary | ICD-10-CM | POA: Diagnosis not present

## 2024-02-22 DIAGNOSIS — K7689 Other specified diseases of liver: Secondary | ICD-10-CM | POA: Diagnosis not present

## 2024-02-22 DIAGNOSIS — C91Z0 Other lymphoid leukemia not having achieved remission: Secondary | ICD-10-CM | POA: Diagnosis not present

## 2024-02-22 DIAGNOSIS — D72829 Elevated white blood cell count, unspecified: Secondary | ICD-10-CM | POA: Diagnosis not present

## 2024-02-22 DIAGNOSIS — G309 Alzheimer's disease, unspecified: Secondary | ICD-10-CM | POA: Diagnosis not present

## 2024-02-22 DIAGNOSIS — R0602 Shortness of breath: Secondary | ICD-10-CM | POA: Diagnosis not present

## 2024-02-22 DIAGNOSIS — R059 Cough, unspecified: Secondary | ICD-10-CM | POA: Diagnosis not present

## 2024-02-22 DIAGNOSIS — F0390 Unspecified dementia without behavioral disturbance: Secondary | ICD-10-CM | POA: Diagnosis not present

## 2024-02-22 DIAGNOSIS — Z66 Do not resuscitate: Secondary | ICD-10-CM | POA: Diagnosis not present

## 2024-02-22 DIAGNOSIS — D61818 Other pancytopenia: Secondary | ICD-10-CM | POA: Diagnosis not present

## 2024-02-22 DIAGNOSIS — R918 Other nonspecific abnormal finding of lung field: Secondary | ICD-10-CM | POA: Diagnosis not present

## 2024-02-22 DIAGNOSIS — Z1152 Encounter for screening for COVID-19: Secondary | ICD-10-CM | POA: Diagnosis not present

## 2024-02-22 LAB — CBC
HCT: 31 % — ABNORMAL LOW (ref 36.0–46.0)
Hemoglobin: 9.5 g/dL — ABNORMAL LOW (ref 12.0–15.0)
MCH: 25.7 pg — ABNORMAL LOW (ref 26.0–34.0)
MCHC: 30.6 g/dL (ref 30.0–36.0)
MCV: 84 fL (ref 80.0–100.0)
Platelets: 123 K/uL — ABNORMAL LOW (ref 150–400)
RBC: 3.69 MIL/uL — ABNORMAL LOW (ref 3.87–5.11)
RDW: 15.5 % (ref 11.5–15.5)
WBC: 10.5 K/uL (ref 4.0–10.5)
nRBC: 0 % (ref 0.0–0.2)

## 2024-02-22 LAB — PHOSPHORUS: Phosphorus: 2.5 mg/dL (ref 2.5–4.6)

## 2024-02-22 LAB — COMPREHENSIVE METABOLIC PANEL WITH GFR
ALT: 12 U/L (ref 0–44)
AST: 22 U/L (ref 15–41)
Albumin: 2.9 g/dL — ABNORMAL LOW (ref 3.5–5.0)
Alkaline Phosphatase: 64 U/L (ref 38–126)
Anion gap: 11 (ref 5–15)
BUN: 19 mg/dL (ref 8–23)
CO2: 19 mmol/L — ABNORMAL LOW (ref 22–32)
Calcium: 8.2 mg/dL — ABNORMAL LOW (ref 8.9–10.3)
Chloride: 107 mmol/L (ref 98–111)
Creatinine, Ser: 0.83 mg/dL (ref 0.44–1.00)
GFR, Estimated: >60 mL/min (ref >60)
Glucose, Bld: 100 mg/dL — ABNORMAL HIGH (ref 70–99)
Potassium: 3.8 mmol/L (ref 3.5–5.1)
Sodium: 137 mmol/L (ref 135–145)
Total Bilirubin: 0.9 mg/dL (ref 0.0–1.2)
Total Protein: 6.5 g/dL (ref 6.5–8.1)

## 2024-02-22 LAB — MAGNESIUM: Magnesium: 1.7 mg/dL (ref 1.7–2.4)

## 2024-02-22 LAB — TSH: TSH: 2.737 u[IU]/mL (ref 0.350–4.500)

## 2024-02-22 MED ORDER — ACETAMINOPHEN 325 MG PO TABS
650.0000 mg | ORAL_TABLET | Freq: Four times a day (QID) | ORAL | Status: DC | PRN
  Administered 2024-02-22 – 2024-02-23 (×4): 650 mg via ORAL
  Filled 2024-02-22 (×4): qty 2

## 2024-02-22 MED ORDER — PANTOPRAZOLE SODIUM 40 MG PO TBEC
40.0000 mg | DELAYED_RELEASE_TABLET | Freq: Every day | ORAL | Status: DC
  Administered 2024-02-22 – 2024-02-26 (×5): 40 mg via ORAL
  Filled 2024-02-22 (×5): qty 1

## 2024-02-22 MED ORDER — DONEPEZIL HCL 10 MG PO TABS
5.0000 mg | ORAL_TABLET | Freq: Every day | ORAL | Status: DC
  Administered 2024-02-22 – 2024-02-25 (×4): 5 mg via ORAL
  Filled 2024-02-22 (×4): qty 1

## 2024-02-22 MED ORDER — SODIUM CHLORIDE 0.9 % IV SOLN
500.0000 mg | INTRAVENOUS | Status: DC
  Administered 2024-02-22: 500 mg via INTRAVENOUS
  Filled 2024-02-22 (×2): qty 5

## 2024-02-22 MED ORDER — LORATADINE 10 MG PO TABS
10.0000 mg | ORAL_TABLET | Freq: Every day | ORAL | Status: DC
  Administered 2024-02-22 – 2024-02-26 (×5): 10 mg via ORAL
  Filled 2024-02-22 (×5): qty 1

## 2024-02-22 MED ORDER — ONDANSETRON HCL 4 MG/2ML IJ SOLN: 4.0000 mg | Freq: Four times a day (QID) | INTRAMUSCULAR | Status: DC | PRN

## 2024-02-22 MED ORDER — GUAIFENESIN ER 600 MG PO TB12
1200.0000 mg | ORAL_TABLET | Freq: Two times a day (BID) | ORAL | Status: DC
  Administered 2024-02-22 – 2024-02-26 (×9): 1200 mg via ORAL
  Filled 2024-02-22 (×9): qty 2

## 2024-02-22 MED ORDER — ACETAMINOPHEN 650 MG RE SUPP: 650.0000 mg | Freq: Four times a day (QID) | RECTAL | Status: DC | PRN

## 2024-02-22 MED ORDER — GUAIFENESIN-DM 100-10 MG/5ML PO SYRP: 5.0000 mL | ORAL_SOLUTION | ORAL | Status: DC | PRN

## 2024-02-22 MED ORDER — FLUTICASONE PROPIONATE 50 MCG/ACT NA SUSP
2.0000 | Freq: Every day | NASAL | Status: DC
  Administered 2024-02-22 – 2024-02-26 (×5): 2 via NASAL
  Filled 2024-02-22: qty 16

## 2024-02-22 MED ORDER — MAGNESIUM SULFATE 2 GM/50ML IV SOLN
2.0000 g | Freq: Once | INTRAVENOUS | Status: AC
  Administered 2024-02-22: 2 g via INTRAVENOUS
  Filled 2024-02-22: qty 50

## 2024-02-22 MED ORDER — SODIUM CHLORIDE 0.9 % IV SOLN: 2.0000 g | Freq: Two times a day (BID) | INTRAVENOUS | Status: DC

## 2024-02-22 MED ORDER — SODIUM CHLORIDE 0.9 % IV SOLN
2.0000 g | Freq: Two times a day (BID) | INTRAVENOUS | Status: DC
  Administered 2024-02-22 – 2024-02-23 (×3): 2 g via INTRAVENOUS
  Filled 2024-02-22 (×3): qty 12.5

## 2024-02-22 MED ORDER — SARGRAMOSTIM 250 MCG IJ SOLR
250.0000 ug | Freq: Every day | INTRAMUSCULAR | Status: DC
  Administered 2024-02-22 – 2024-02-24 (×3): 250 ug via SUBCUTANEOUS
  Filled 2024-02-22 (×3): qty 1

## 2024-02-22 MED ORDER — HEPARIN SODIUM (PORCINE) 5000 UNIT/ML IJ SOLN
5000.0000 [IU] | Freq: Three times a day (TID) | INTRAMUSCULAR | Status: DC
  Administered 2024-02-22 – 2024-02-26 (×14): 5000 [IU] via SUBCUTANEOUS
  Filled 2024-02-22 (×14): qty 1

## 2024-02-22 MED ORDER — BENAZEPRIL HCL 5 MG PO TABS
10.0000 mg | ORAL_TABLET | Freq: Every day | ORAL | Status: DC
  Administered 2024-02-22 – 2024-02-26 (×5): 10 mg via ORAL
  Filled 2024-02-22 (×5): qty 2

## 2024-02-22 MED ORDER — FOLIC ACID 1 MG PO TABS
1.0000 mg | ORAL_TABLET | Freq: Every day | ORAL | Status: DC
  Administered 2024-02-22 – 2024-02-26 (×5): 1 mg via ORAL
  Filled 2024-02-22 (×5): qty 1

## 2024-02-22 MED ORDER — IOHEXOL 300 MG/ML  SOLN
100.0000 mL | Freq: Once | INTRAMUSCULAR | Status: AC | PRN
  Administered 2024-02-22: 100 mL via INTRAVENOUS

## 2024-02-22 MED ORDER — LEVOTHYROXINE SODIUM 88 MCG PO TABS
88.0000 ug | ORAL_TABLET | Freq: Every day | ORAL | Status: DC
  Administered 2024-02-22 – 2024-02-26 (×5): 88 ug via ORAL
  Filled 2024-02-22 (×5): qty 1

## 2024-02-22 MED ORDER — SODIUM CHLORIDE 0.9 % IV SOLN: INTRAVENOUS | Status: AC

## 2024-02-22 MED ORDER — SODIUM CHLORIDE 0.9 % IV SOLN: 2.0000 g | INTRAVENOUS | Status: DC

## 2024-02-22 MED ORDER — ONDANSETRON HCL 4 MG PO TABS: 4.0000 mg | ORAL_TABLET | Freq: Four times a day (QID) | ORAL | Status: DC | PRN

## 2024-02-22 MED ORDER — MEGESTROL ACETATE 400 MG/10ML PO SUSP
400.0000 mg | Freq: Every day | ORAL | Status: DC
  Administered 2024-02-22 – 2024-02-26 (×5): 400 mg via ORAL
  Filled 2024-02-22 (×5): qty 10

## 2024-02-22 MED ORDER — IOHEXOL 9 MG/ML PO SOLN
500.0000 mL | ORAL | Status: AC
  Administered 2024-02-22 (×2): 500 mL via ORAL

## 2024-02-22 MED ORDER — ALBUTEROL SULFATE (2.5 MG/3ML) 0.083% IN NEBU: 2.5000 mg | INHALATION_SOLUTION | RESPIRATORY_TRACT | Status: DC | PRN

## 2024-02-22 NOTE — H&P (Signed)
 History and Physical    [name] [name] FMW:[id number] DOB: [birth year] DOA: 02/21/2024  PCP: [name], MD  Patient coming from: [facility]  I have personally briefly reviewed patient's old medical records in [facility]  Chief Complaint: cough/SHOB  HPI: [name] [name] is a 89 y.o. female with medical history significant of  RA, dementia, HLD, HTN, Hypothyroidism, Leukemia who presents to [facility] brought in due to [facility], Cough associated with poor po intake and lethargy.  Patient denies any chest pain , n/v/d/ or dysuria.  Patient noted no other complaint currently other than cough. She denies sob currently.  ED Course:  Temp 101.7, bp 147/62, hr 108, rr 18 , sat 96% on ra  Lactic 0.9 NA 137, K 4.3, Cl 103, bicarb 21, K 4.3, Cl 103 Cr 1.17,  Lactic 0.9 Wbc 14, hgb 10.5 at baseline, plt 142 Cxr NAD RVP -neg UA neg Tx tylenol  , azithromycin ,CTX,vanc/cefepime   Review of Systems: As per HPI otherwise 10 point review of systems negative.   Past Medical History:  Diagnosis Date   Alzheimer disease [facility])    dx by Dr. Berneta per patient's son.   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   Rheumatoid arthritis(714.0) 01/19/2007    Past Surgical History:  Procedure Laterality Date   ABDOMINAL HYSTERECTOMY     APPENDECTOMY     CHOLECYSTECTOMY     NECK SURGERY       reports that she has never smoked. She has never been exposed to tobacco smoke. She has never used smokeless tobacco. She reports that she does not drink alcohol and does not use drugs.  Allergies  Allergen Reactions   Tramadol  Nausea Only   Codeine Phosphate Nausea Only    Family History  Problem Relation Age of Onset   Diabetes Father    Heart disease Father    Cancer Sister        breast    Heart disease Sister    Heart attack Brother    Heart  attack Brother    Heart attack Brother     Prior to Admission medications   Medication Sig Start Date End Date Taking? Authorizing Provider  acetaminophen  (TYLENOL ) 325 MG tablet Take 650 mg by mouth as needed for headache.    [provider]  benazepril  (LOTENSIN ) 5 MG tablet TAKE 2 TABLETS (10MG  TOTAL) BY MOUTH EVERY DAY 08/26/23   [name], MD  donepezil  (ARICEPT  ODT) 5 MG disintegrating tablet Take 1 tablet (5 mg total) by mouth at bedtime. 09/21/23   [name], MD  folic acid  (FOLVITE ) 1 MG tablet Take 1 mg by mouth daily.    [provider]  leflunomide  (ARAVA ) 10 MG tablet TAKE 1 TABLET BY MOUTH EVERY DAY 02/18/24   Cheryl Waddell CHRISTELLA, PA-C  levothyroxine  (SYNTHROID ) 88 MCG tablet TAKE 1 TABLET BY MOUTH EVERY DAY BEFORE BREAKFAST 02/04/24   Timmy Maude SAUNDERS, MD  megestrol  ([name] ) 400 MG/10ML suspension Take 10 mLs (400 mg total) by mouth daily. 05/13/23   Timmy Maude SAUNDERS, MD  Multiple Vitamins-Minerals (WOMENS MULTIVITAMIN PO) Take by mouth daily. Patient not taking: Reported on 12/25/2023    [provider]  senna-docusate (SENOKOT-S) 8.6-50 MG tablet Take 1 tablet by mouth 2 (two) times daily. Patient not taking: Reported on 12/25/2023 11/07/22   Cheryle Page, MD    Physical Exam: Vitals:  02/22/24 0000 02/22/24 0100 02/22/24 0211 02/22/24 0237  BP: (!) 150/61 (!) 159/60 (!) 179/62   Pulse: 87  94   Resp:   16   Temp:   98.4 F (36.9 C)   TempSrc:   Oral   SpO2: 99%  98%   Height:    5' (1.524 m)    Constitutional: NAD, calm, comfortable Vitals:   02/22/24 0000 02/22/24 0100 02/22/24 0211 02/22/24 0237  BP: (!) 150/61 (!) 159/60 (!) 179/62   Pulse: 87  94   Resp:   16   Temp:   98.4 F (36.9 C)   TempSrc:   Oral   SpO2: 99%  98%   Height:    5' (1.524 m)   Eyes: pupils equal  lids and conjunctivae normal ENMT: Mucous membranes are moist.  Neck: normal, supple, no masses, no thyromegaly Respiratory: clear to  auscultation bilaterally, no wheezing, no crackles. Normal respiratory effort. No accessory muscle use.  Cardiovascular: Regular rate and rhythm, +murmurs,  no rubs / gallops. No extremity edema. Warm extremities Abdomen: no tenderness, no masses palpated. No hepatosplenomegaly. Bowel sounds positive.  Musculoskeletal: no clubbing / cyanosis. No joint deformity upper and lower extremities. Good ROM, no contractures. Normal muscle tone.  Skin: no rashes, lesions, ulcers. No induration Neurologic: CN  grossly intact. Sensation intact,  Strength 5/5 in all 4.  Psychiatric:  alert to place and self   Labs on Admission: I have personally reviewed following labs and imaging studies  CBC: Recent Labs  Lab 02/21/24 1635  WBC 14.0*  NEUTROABS 1.0*  HGB 10.5*  HCT 33.9*  MCV 82.7  PLT 142*   Basic Metabolic Panel: Recent Labs  Lab 02/21/24 1635  NA 137  K 4.3  CL 103  CO2 21*  GLUCOSE 103*  BUN 21  CREATININE 1.17*  CALCIUM  8.8*   GFR: CrCl cannot be calculated (Unknown ideal weight.). Liver Function Tests: Recent Labs  Lab 02/21/24 1635  AST 28  ALT 8  ALKPHOS 91  BILITOT 0.5  PROT 7.2  ALBUMIN 3.6   No results for input(s): LIPASE, AMYLASE in the last 168 hours. No results for input(s): AMMONIA in the last 168 hours. Coagulation Profile: Recent Labs  Lab 02/21/24 1635  INR 1.0   Cardiac Enzymes: No results for input(s): CKTOTAL, CKMB, CKMBINDEX, TROPONINI in the last 168 hours. BNP (last 3 results) No results for input(s): PROBNP in the last 8760 hours. HbA1C: No results for input(s): HGBA1C in the last 72 hours. CBG: No results for input(s): GLUCAP in the last 168 hours. Lipid Profile: No results for input(s): CHOL, HDL, LDLCALC, TRIG, CHOLHDL, LDLDIRECT in the last 72 hours. Thyroid  Function Tests: No results for input(s): TSH, T4TOTAL, FREET4, T3FREE, THYROIDAB in the last 72 hours. Anemia Panel: No results for  input(s): VITAMINB12, FOLATE, FERRITIN, TIBC, IRON, RETICCTPCT in the last 72 hours. Urine analysis:    Component Value Date/Time   COLORURINE YELLOW 02/21/2024 2341   APPEARANCEUR CLEAR 02/21/2024 2341   LABSPEC 1.018 02/21/2024 2341   PHURINE 5.5 02/21/2024 2341   GLUCOSEU NEGATIVE 02/21/2024 2341   GLUCOSEU NEGATIVE 04/23/2022 1104   HGBUR NEGATIVE 02/21/2024 2341   BILIRUBINUR NEGATIVE 02/21/2024 2341   KETONESUR NEGATIVE 02/21/2024 2341   PROTEINUR 30 (A) 02/21/2024 2341   UROBILINOGEN 0.2 04/23/2022 1104   NITRITE NEGATIVE 02/21/2024 2341   LEUKOCYTESUR NEGATIVE 02/21/2024 2341    Radiological Exams on Admission: DG Chest Portable 1 View Result Date: 02/21/2024 CLINICAL DATA:  Short  of breath.  Cough. EXAM: PORTABLE CHEST 1 VIEW COMPARISON:  10/31/2022 and older exams. FINDINGS: Cardiac silhouette is normal in size. No mediastinal or hilar masses. Lungs are hyperexpanded, but clear. No pleural effusion or pneumothorax. Skeletal structures are demineralized, grossly intact. IMPRESSION: No active disease. Electronically Signed   By: Alm Parkins M.D.   On: 02/21/2024 17:48    EKG: Independently reviewed.  Assessment/Plan Fever Unknown Origin Leukocytosis -in setting of  Leukemia/neutropenia -noted wbc 14 - cxr/ua/ rvp negative -patient with respiratory symptoms -agree will continue with broad spectrum abx with vanc/cefepime  /azithromycin   -CT thorax/abd for further evaluation  -f/u with blood and sputum cultures -trend inflammatory markers   RA  -hold Arava    Alzhiemers Dementia  - continue aricept     HLD -continue statin    HTN -continue ACEI   Hypothyroidism -continue synthroid     Leukemia -followed by Dr Timmy -on GM-CSF q2-3 for persistent neutropenia   DVT prophylaxis: heparin  Code Status:full/ as discussed per patient wishes in event of cardiac arrest  Family Communication: .none at bedside Disposition Plan: patient  expected to  be admitted greater than 2 midnights  Consults called:  will need oncology consult in am  Admission status: med tele   Camila DELENA Ned MD [facility]   If 7PM-7AM, please contact night-coverage [email or web address] Password [email or web address]  02/22/2024, 3:56 AM

## 2024-02-22 NOTE — Progress Notes (Cosign Needed)
 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  She was admitted on 02/22/2024 due to cough and shortness of breath.  Cough Shortness of breath - Patient admitted on 02/22/2024 due to complaints of shortness of breath, cough associated with poor p.o. intake and lethargy. - Patient currently denies shortness of breath and unsure of why she is in the hospital. - Continue supportive care  History of large granular lymphocytic leukemia - Diagnosed in December 2022 - She was started on methotrexate  20 mg p.o. weekly, on 07/27/2021.  This was discontinued 11/07/2022 due to pancytopenia. - Has been on G-CSF every 2 to 3 weeks - Medical oncology/Dr. Timmy following closely.  Last seen in outpatient oncology office 02/03/2024.  Anemia Thrombocytopenia - Likely multifactorial due to malignancy, methotrexate , comorbidities and poor nutrition - Hemoglobin low 9.5.  No transfusional intervention required at this time. - Platelets slightly low 123K.  No transfusional intervention at this time. - Continue to monitor CBC with differential  Neutropenia - WBC 10.5 today 7/21 with ANC 1.0 on 7/20 - On G-CSF support - Continue to monitor CBC with differential  Dementia Rheumatoid arthritis - Continue supportive care    Code Status Full  Subjective:  Patient seen awake and alert laying in bed.  Appears to be very pleasant.  States that she feels better today. Denies chest pain or other acute pain, nausea, vomiting, diarrhea or constipation.  Patient states she does not know why she is in the hospital.  No other acute distress is noted.  Objective:   Intake/Output Summary (Last 24 hours) at 02/22/2024 0940 Last data filed at 02/22/2024 0131 Gross per 24 hour  Intake 702.36 ml  Output --  Net 702.36 ml     PHYSICAL  EXAMINATION: ECOG PERFORMANCE STATUS: 3 - Symptomatic, >50% confined to bed  Vitals:   02/22/24 0556 02/22/24 0754  BP: (!) 161/73 (!) 130/57  Pulse: (!) 106 92  Resp:  18  Temp: (!) 102.5 F (39.2 C) 98.4 F (36.9 C)  SpO2: 97% 96%   Filed Weights   02/22/24 0900  Weight: 113 lb 5.1 oz (51.4 kg)    GENERAL: alert, +thin-appearing +frail SKIN: +pale skin color, texture, turgor are normal, no rashes or significant lesions EYES: normal, conjunctiva are pink and non-injected, sclera clear OROPHARYNX: no exudate, no erythema and lips, buccal mucosa, and tongue normal  NECK: supple, thyroid  normal size, non-tender, without nodularity LYMPH: no palpable lymphadenopathy in the cervical, axillary or inguinal LUNGS: clear to auscultation and percussion with normal breathing effort HEART: regular rate & rhythm and no murmurs and no lower extremity edema ABDOMEN: abdomen soft, non-tender and normal bowel sounds MUSCULOSKELETAL: no cyanosis of digits and no clubbing  PSYCH: +hx of dementia, +forgetful    All questions were answered. The patient knows to call the clinic with any problems, questions or concerns.   The total time spent in the appointment was 40 minutes encounter with patient including review of chart and various tests results, discussions about plan of care and coordination of care plan  Olam [name] Brunner, NP 02/22/2024 9:40 AM    Labs Reviewed:  Lab Results  Component Value Date   WBC 10.5 02/22/2024   HGB 9.5 (L) 02/22/2024   HCT 31.0 (L) 02/22/2024   MCV 84.0 02/22/2024   PLT 123 (L)  02/22/2024   Recent Labs    02/03/24 1210 02/21/24 1635 02/22/24 0701  NA 141 137 137  K 4.5 4.3 3.8  CL 108 103 107  CO2 25 21* 19*  GLUCOSE 93 103* 100*  BUN 26* 21 19  CREATININE 1.17* 1.17* 0.83  CALCIUM  9.4 8.8* 8.2*  GFRNONAA 45* 45* >60  PROT 7.6 7.2 6.5  ALBUMIN 3.8 3.6 2.9*  AST 20 28 22   ALT 9 8 12   ALKPHOS 75 91 64  BILITOT 0.5 0.5 0.9    Studies Reviewed:   DG Chest Portable 1 View Result Date: 02/21/2024 CLINICAL DATA:  Short of breath.  Cough. EXAM: PORTABLE CHEST 1 VIEW COMPARISON:  10/31/2022 and older exams. FINDINGS: Cardiac silhouette is normal in size. No mediastinal or hilar masses. Lungs are hyperexpanded, but clear. No pleural effusion or pneumothorax. Skeletal structures are demineralized, grossly intact. IMPRESSION: No active disease. Electronically Signed   By: Alm Parkins M.D.   On: 02/21/2024 17:48     ADDENDUM: I saw and examined [name] [name].  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  Her big problem is the dementia.  She has weight loss.  She does not eat.  She I do not think remembers to eat.  She has had negative cultures.  I suspect this might be some type of viral syndrome.  She really has no localizing signs.  She has CT scan of the chest abdomen and pelvis which really was unremarkable.  There is some thickening of airways which may suggest bronchitis.  She is not coughing.  I we will give her some Leukine .  This has helped her in the past.  I did talk to her son.  The patient's dementia I think really is a problem.  She has been quite fortunate in which she really has had very little in the way of temperatures or obvious infections.  Again I want her quality of life for her.  We will have to talk to her son about I think end-of-life issues.  I think that she might be a candidate for Hospice given the dementia and the fact that she does not really eat.  I really do not see a role for any invasive studies on her.  Again, I think we really have to focus on her quality of life.  I know that the staff up on [address] are doing a great job with her.  I really do appreciate their help.    Jeralyn Crease, MD  [place] 1:5

## 2024-02-22 NOTE — Progress Notes (Signed)
 PT Cancellation Note  Patient Details Name: [name] [name] MRN: [id number] DOB: August 13, 1934   Cancelled Treatment:    Reason Eval/Treat Not Completed: Patient at procedure or test/unavailable. Pt out of room for CT upon therapist's arrival. Will continue back for PT Eval as schedule permits.   Metta Ave PT, DPT 02/22/24, 12:47 PM

## 2024-02-22 NOTE — Progress Notes (Signed)
 PROGRESS NOTE    [name] [name]  FMW:[id number] DOB: 17-Jan-1935 DOA: 02/21/2024 PCP: [name], MD    Chief Complaint  Patient presents with   Cough    Brief Narrative:  Patient 89 year old female history of rheumatoid arthritis, dementia, hyperlipidemia, hypertension, hypothyroidism, leukemia presented to [facility] due to shortness of breath, cough, decreased intake and lethargy.  Patient also noted to have a fever on presentation with a temp of 101.7.  Chest x-ray done unremarkable.  Patient received a dose of IV Vanco and cefepime .  Patient admitted due to concerns of fever of unknown origin.   Assessment & Plan:   Principal Problem:   FUO (fever of unknown origin) Active Problems:   Dyslipidemia   Essential hypertension   Rheumatoid arthritis (HCC)   History of hypothyroidism   Chronic kidney disease (CKD) stage G3b   Paroxysmal supraventricular tachycardia (HCC)   Iron deficiency anemia   Large granular lymphocytic leukemia (HCC)   Dehydration   Murmur  #1 fever - Patient noted to have presented with fever noted in the ED to have a temp of 102.5. - Patient also with a leukocytosis. - Patient presented with respiratory complaints of shortness of breath, cough. - Chest x-ray done with no acute infiltrate noted. - CT chest, CT abdomen and pelvis ordered and pending. - Blood cultures pending. - SARS coronavirus 2 by PCR negative, influenza A and B by PCR negative, RSV by PCR negative. - Placed on Flonase , Mucinex , Claritin , Protonix . - Continue azithromycin . - Place empirically on cefepime  pending culture results. - Supportive care.  2.  Dehydration -IV fluids.  3.  Rheumatoid arthritis -Continue to hold Arava  and resume after completion of antibiotic course.  4.  Alzheimer's dementia -Resume home regimen Aricept .  5.  Hypothyroidism -Resume home regimen Synthroid .  6.  Iron deficiency anemia -H&H stable. - Follow-up. - Transfuse for Hgb  < 8.  7.  Large granular lymphocytic leukemia -Informed oncology via epic of admission. -On GM-CSF every 2-3 weeks - Outpatient follow-up with oncology.  8.  Hypertension -Resume home regimen of Lotensin .  9.  Hyperlipidemia -Outpatient follow-up with PCP.  10.  Murmur -Patient with murmur noted on examination. - 2D echo from 12/06/2020 with mild aortic valve stenosis. - Will repeat 2D echo.  11.  CKD stage IIIb -Stable.    DVT prophylaxis: Heparin  Code Status: Full Family Communication: Updated patient.  No family at bedside. Disposition: TBD  Status is: Inpatient Remains inpatient appropriate because: Severity of illness.   Consultants:  Oncology informed of admission via epic  Procedures:  Chest x-ray 02/21/2024 CT chest/CT abdomen and pelvis pending 02/22/2024  Antimicrobials:  Anti-infectives (From admission, onward)    Start     Dose/Rate Route Frequency Ordered Stop   02/22/24 2200  ceFEPIme  (MAXIPIME ) 2 g in sodium chloride  0.9 % 100 mL IVPB  Status:  Discontinued        2 g 200 mL/hr over 30 Minutes Intravenous Every 12 hours 02/22/24 0845 02/22/24 0856   02/22/24 2000  ceFEPIme  (MAXIPIME ) 2 g in sodium chloride  0.9 % 100 mL IVPB  Status:  Discontinued        2 g 200 mL/hr over 30 Minutes Intravenous Every 12 hours 02/22/24 0839 02/22/24 0845   02/22/24 1700  azithromycin  (ZITHROMAX ) 500 mg in sodium chloride  0.9 % 250 mL IVPB        500 mg 250 mL/hr over 60 Minutes Intravenous Every 24 hours 02/22/24 0437  02/22/24 1200  ceFEPIme  (MAXIPIME ) 2 g in sodium chloride  0.9 % 100 mL IVPB        2 g 200 mL/hr over 30 Minutes Intravenous Every 12 hours 02/22/24 0856     02/22/24 0845  ceFEPIme  (MAXIPIME ) 2 g in sodium chloride  0.9 % 100 mL IVPB  Status:  Discontinued        2 g 200 mL/hr over 30 Minutes Intravenous STAT 02/22/24 0836 02/22/24 0855   02/22/24 0000  ceFEPIme  (MAXIPIME ) 2 g in sodium chloride  0.9 % 100 mL IVPB        2 g 200 mL/hr over 30  Minutes Intravenous  Once 02/21/24 2357 02/22/24 0131   02/22/24 0000  vancomycin  (VANCOCIN ) IVPB 1000 mg/200 mL premix        1,000 mg 200 mL/hr over 60 Minutes Intravenous  Once 02/21/24 2357 02/22/24 1440   02/21/24 1700  cefTRIAXone  (ROCEPHIN ) 1 g in sodium chloride  0.9 % 100 mL IVPB        1 g 200 mL/hr over 30 Minutes Intravenous  Once 02/21/24 1656 02/21/24 1917   02/21/24 1700  azithromycin  (ZITHROMAX ) 500 mg in sodium chloride  0.9 % 250 mL IVPB        500 mg 250 mL/hr over 60 Minutes Intravenous  Once 02/21/24 1656 02/21/24 1917         Subjective: Patient sitting up in bed.  Still with shortness of breath but some improvement since admission.  Feels cough is improving.  Denies any chest pain or abdominal pain.  Overall feeling a little bit better than when she did on admission.  Objective: Vitals:   02/22/24 0556 02/22/24 0754 02/22/24 0900 02/22/24 1126  BP: (!) 161/73 (!) 130/57  (!) 145/57  Pulse: (!) 106 92  84  Resp:  18  16  Temp: (!) 102.5 F (39.2 C) 98.4 F (36.9 C)  (!) 97.5 F (36.4 C)  TempSrc: Oral Oral  Oral  SpO2: 97% 96%  97%  Weight:   51.4 kg   Height:   5' 1.5 (1.562 m)     Intake/Output Summary (Last 24 hours) at 02/22/2024 1518 Last data filed at 02/22/2024 1502 Gross per 24 hour  Intake 1193.41 ml  Output 400 ml  Net 793.41 ml   Filed Weights   02/22/24 0900  Weight: 51.4 kg    Examination:  General exam: Appears calm and comfortable.  Dry mucous membranes. Respiratory system: Left coarse basilar breath sounds.  No wheezing.  Fair air movement.  Speaking in full sentences.   Cardiovascular system: RRR. 3/6 SEM.  No JVD, no murmurs rubs or gallops.  No pitting lower extremity edema.  Gastrointestinal system: Abdomen is nondistended, soft and nontender. No organomegaly or masses felt. Normal bowel sounds heard. Central nervous system: Alert and oriented. No focal neurological deficits. Extremities: Symmetric 5 x 5 power. Skin: No  rashes, lesions or ulcers Psychiatry: Judgement and insight appear fair. Mood & affect appropriate.     Data Reviewed: I have personally reviewed following labs and imaging studies  CBC: Recent Labs  Lab 02/21/24 1635 02/22/24 0701  WBC 14.0* 10.5  NEUTROABS 1.0*  --   HGB 10.5* 9.5*  HCT 33.9* 31.0*  MCV 82.7 84.0  PLT 142* 123*    Basic Metabolic Panel: Recent Labs  Lab 02/21/24 1635 02/22/24 0701  NA 137 137  K 4.3 3.8  CL 103 107  CO2 21* 19*  GLUCOSE 103* 100*  BUN 21 19  CREATININE 1.17*  0.83  CALCIUM  8.8* 8.2*  MG  --  1.7  PHOS  --  2.5    GFR: Estimated Creatinine Clearance: 36.9 mL/min (by C-G formula based on SCr of 0.83 mg/dL).  Liver Function Tests: Recent Labs  Lab 02/21/24 1635 02/22/24 0701  AST 28 22  ALT 8 12  ALKPHOS 91 64  BILITOT 0.5 0.9  PROT 7.2 6.5  ALBUMIN 3.6 2.9*    CBG: No results for input(s): GLUCAP in the last 168 hours.   Recent Results (from the past 240 hours)  Culture, blood (Routine x 2)     Status: None (Preliminary result)   Collection Time: 02/21/24  4:34 PM   Specimen: BLOOD  Result Value Ref Range Status   Specimen Description   Final    BLOOD LEFT ANTECUBITAL Performed at [facility], [address], [place], [place] [address]    Special Requests   Final    BOTTLES DRAWN AEROBIC AND ANAEROBIC Blood Culture adequate volume Performed at [facility], [address], [place], [place] [address]    Culture   Final    NO GROWTH < 12 HOURS Performed at [facility], [address] [place], [place] [address]    Report Status PENDING  Incomplete  Culture, blood (Routine x 2)     Status: None (Preliminary result)   Collection Time: 02/21/24  5:13 PM   Specimen: BLOOD  Result Value Ref Range Status   Specimen Description   Final    BLOOD RIGHT ANTECUBITAL Performed at [facility], [address] [place], [place] [address]    Special  Requests   Final    BOTTLES DRAWN AEROBIC AND ANAEROBIC Blood Culture results may not be optimal due to an inadequate volume of blood received in culture bottles Performed at [facility], [address], [place], [place] [address]    Culture   Final    NO GROWTH < 12 HOURS Performed at [facility], [address] [place], [place] [address]    Report Status PENDING  Incomplete  Resp panel by RT-PCR (RSV, Flu A&B, Covid) Anterior Nasal Swab     Status: None   Collection Time: 02/21/24  7:29 PM   Specimen: Anterior Nasal Swab  Result Value Ref Range Status   SARS Coronavirus 2 by RT PCR NEGATIVE NEGATIVE Final    Comment: (NOTE) SARS-CoV-2 target nucleic acids are NOT DETECTED.  The SARS-CoV-2 RNA is generally detectable in upper respiratory specimens during the acute phase of infection. The lowest concentration of SARS-CoV-2 viral copies this assay can detect is 138 copies/mL. A negative result does not preclude SARS-Cov-2 infection and should not be used as the sole basis for treatment or other patient management decisions. A negative result may occur with  improper specimen collection/handling, submission of specimen other than nasopharyngeal swab, presence of viral mutation(s) within the areas targeted by this assay, and inadequate number of viral copies(<138 copies/mL). A negative result must be combined with clinical observations, patient history, and epidemiological information. The expected result is Negative.  Fact Sheet for Patients:  BloggerCourse.com  Fact Sheet for Healthcare Providers:  SeriousBroker.it  This test is no t yet approved or cleared by the [place]  FDA and  has been authorized for detection and/or diagnosis of SARS-CoV-2 by FDA under an Emergency Use Authorization (EUA). This EUA will remain  in effect (meaning this test can be used) for the duration of the COVID-19  declaration under  Section 564(b)(1) of the Act, 21 U.S.C.section 360bbb-3(b)(1), unless the authorization is terminated  or revoked sooner.       Influenza A by PCR NEGATIVE NEGATIVE Final   Influenza B by PCR NEGATIVE NEGATIVE Final    Comment: (NOTE) The Xpert Xpress SARS-CoV-2/FLU/RSV plus assay is intended as an aid in the diagnosis of influenza from Nasopharyngeal swab specimens and should not be used as a sole basis for treatment. Nasal washings and aspirates are unacceptable for Xpert Xpress SARS-CoV-2/FLU/RSV testing.  Fact Sheet for Patients: BloggerCourse.com  Fact Sheet for Healthcare Providers: SeriousBroker.it  This test is not yet approved or cleared by the [place]  FDA and has been authorized for detection and/or diagnosis of SARS-CoV-2 by FDA under an Emergency Use Authorization (EUA). This EUA will remain in effect (meaning this test can be used) for the duration of the COVID-19 declaration under Section 564(b)(1) of the Act, 21 U.S.C. section 360bbb-3(b)(1), unless the authorization is terminated or revoked.     Resp Syncytial Virus by PCR NEGATIVE NEGATIVE Final    Comment: (NOTE) Fact Sheet for Patients: BloggerCourse.com  Fact Sheet for Healthcare Providers: SeriousBroker.it  This test is not yet approved or cleared by the [place]  FDA and has been authorized for detection and/or diagnosis of SARS-CoV-2 by FDA under an Emergency Use Authorization (EUA). This EUA will remain in effect (meaning this test can be used) for the duration of the COVID-19 declaration under Section 564(b)(1) of the Act, 21 U.S.C. section 360bbb-3(b)(1), unless the authorization is terminated or revoked.  Performed at [facility], [address], [place], [place] [address]          Radiology Studies: DG Chest Portable 1 View Result Date:  02/21/2024 CLINICAL DATA:  Short of breath.  Cough. EXAM: PORTABLE CHEST 1 VIEW COMPARISON:  10/31/2022 and older exams. FINDINGS: Cardiac silhouette is normal in size. No mediastinal or hilar masses. Lungs are hyperexpanded, but clear. No pleural effusion or pneumothorax. Skeletal structures are demineralized, grossly intact. IMPRESSION: No active disease. Electronically Signed   By: Alm Parkins M.D.   On: 02/21/2024 17:48        Scheduled Meds:  benazepril   10 mg Oral Daily   donepezil   5 mg Oral QHS   fluticasone   2 spray Each Nare Daily   folic acid   1 mg Oral Daily   guaiFENesin   1,200 mg Oral BID   heparin   5,000 Units Subcutaneous Q8H   levothyroxine   88 mcg Oral Q0600   loratadine   10 mg Oral Daily   megestrol   400 mg Oral Daily   pantoprazole   40 mg Oral Q0600   Continuous Infusions:  sodium chloride  75 mL/hr at 02/22/24 9387   azithromycin      ceFEPime  (MAXIPIME ) IV 2 g (02/22/24 1149)     LOS: 0 days    Time spent: 40 minutes    Toribio Hummer, MD [facility]   To contact the attending provider between 7A-7P or the covering provider during after hours 7P-7A, please log into the web site [email or web address] and access using universal [place] password for that web site. If you do not have the password, please call the hospital operator.  02/22/2024, 3:18 PM

## 2024-02-23 ENCOUNTER — Inpatient Hospital Stay (HOSPITAL_COMMUNITY)

## 2024-02-23 DIAGNOSIS — D509 Iron deficiency anemia, unspecified: Secondary | ICD-10-CM | POA: Diagnosis not present

## 2024-02-23 DIAGNOSIS — R0602 Shortness of breath: Secondary | ICD-10-CM | POA: Diagnosis not present

## 2024-02-23 DIAGNOSIS — R509 Fever, unspecified: Secondary | ICD-10-CM

## 2024-02-23 DIAGNOSIS — E785 Hyperlipidemia, unspecified: Secondary | ICD-10-CM | POA: Diagnosis not present

## 2024-02-23 DIAGNOSIS — D649 Anemia, unspecified: Secondary | ICD-10-CM

## 2024-02-23 DIAGNOSIS — C91Z0 Other lymphoid leukemia not having achieved remission: Secondary | ICD-10-CM

## 2024-02-23 DIAGNOSIS — D72829 Elevated white blood cell count, unspecified: Secondary | ICD-10-CM

## 2024-02-23 DIAGNOSIS — M069 Rheumatoid arthritis, unspecified: Secondary | ICD-10-CM

## 2024-02-23 DIAGNOSIS — F0390 Unspecified dementia without behavioral disturbance: Secondary | ICD-10-CM

## 2024-02-23 DIAGNOSIS — R011 Cardiac murmur, unspecified: Secondary | ICD-10-CM | POA: Diagnosis not present

## 2024-02-23 DIAGNOSIS — D709 Neutropenia, unspecified: Secondary | ICD-10-CM | POA: Insufficient documentation

## 2024-02-23 DIAGNOSIS — D696 Thrombocytopenia, unspecified: Secondary | ICD-10-CM

## 2024-02-23 DIAGNOSIS — D849 Immunodeficiency, unspecified: Secondary | ICD-10-CM

## 2024-02-23 LAB — ECHOCARDIOGRAM COMPLETE
AR max vel: 1 cm2
AV Area VTI: 1.16 cm2
AV Area mean vel: 0.98 cm2
AV Mean grad: 15 mmHg
AV Peak grad: 27.2 mmHg
Ao pk vel: 2.61 m/s
Area-P 1/2: 5.31 cm2
Height: 61.5 in
S' Lateral: 2.7 cm
Weight: 1910.4 [oz_av]

## 2024-02-23 LAB — CBC WITH DIFFERENTIAL/PLATELET
Abs Immature Granulocytes: 0.04 K/uL (ref 0.00–0.07)
Basophils Absolute: 0.1 K/uL (ref 0.0–0.1)
Basophils Relative: 0 %
Eosinophils Absolute: 0.1 K/uL (ref 0.0–0.5)
Eosinophils Relative: 1 %
HCT: 31.7 % — ABNORMAL LOW (ref 36.0–46.0)
Hemoglobin: 9.5 g/dL — ABNORMAL LOW (ref 12.0–15.0)
Immature Granulocytes: 0 %
Lymphocytes Relative: 84 %
Lymphs Abs: 11.1 K/uL — ABNORMAL HIGH (ref 0.7–4.0)
MCH: 25.1 pg — ABNORMAL LOW (ref 26.0–34.0)
MCHC: 30 g/dL (ref 30.0–36.0)
MCV: 83.6 fL (ref 80.0–100.0)
Monocytes Absolute: 0.9 K/uL (ref 0.1–1.0)
Monocytes Relative: 7 %
Neutro Abs: 1.1 K/uL — ABNORMAL LOW (ref 1.7–7.7)
Neutrophils Relative %: 8 %
Platelets: 107 K/uL — ABNORMAL LOW (ref 150–400)
RBC: 3.79 MIL/uL — ABNORMAL LOW (ref 3.87–5.11)
RDW: 15.3 % (ref 11.5–15.5)
WBC: 13.3 K/uL — ABNORMAL HIGH (ref 4.0–10.5)
nRBC: 0.2 % (ref 0.0–0.2)

## 2024-02-23 LAB — RESPIRATORY PANEL BY PCR

## 2024-02-23 LAB — BASIC METABOLIC PANEL WITH GFR
Anion gap: 8 (ref 5–15)
BUN: 17 mg/dL (ref 8–23)
CO2: 19 mmol/L — ABNORMAL LOW (ref 22–32)
Calcium: 7.8 mg/dL — ABNORMAL LOW (ref 8.9–10.3)
Chloride: 108 mmol/L (ref 98–111)
Creatinine, Ser: 1.16 mg/dL — ABNORMAL HIGH (ref 0.44–1.00)
GFR, Estimated: 46 mL/min — ABNORMAL LOW (ref >60)
Glucose, Bld: 113 mg/dL — ABNORMAL HIGH (ref 70–99)
Potassium: 3.7 mmol/L (ref 3.5–5.1)
Sodium: 135 mmol/L (ref 135–145)

## 2024-02-23 LAB — STREP PNEUMONIAE URINARY ANTIGEN: Strep Pneumo Urinary Antigen: NEGATIVE

## 2024-02-23 LAB — MAGNESIUM: Magnesium: 1.9 mg/dL (ref 1.7–2.4)

## 2024-02-23 MED ORDER — SODIUM CHLORIDE 0.9 % IV SOLN: INTRAVENOUS | Status: AC

## 2024-02-23 MED ORDER — VANCOMYCIN HCL IN DEXTROSE 1-5 GM/200ML-% IV SOLN: 1000.0000 mg | INTRAVENOUS | Status: DC

## 2024-02-23 MED ORDER — DOXYCYCLINE HYCLATE 100 MG PO TABS
100.0000 mg | ORAL_TABLET | Freq: Two times a day (BID) | ORAL | Status: DC
  Administered 2024-02-23 – 2024-02-26 (×7): 100 mg via ORAL
  Filled 2024-02-23 (×7): qty 1

## 2024-02-23 NOTE — Progress Notes (Signed)
   02/22/24 2211  Assess: MEWS Score  Temp (!) 102.9 F (39.4 C)  BP 138/76  MAP (mmHg) 92  Pulse Rate (!) 121  Resp 17  SpO2 96 %  O2 Device Room Air  Assess: MEWS Score  MEWS Temp 2  MEWS Systolic 0  MEWS Pulse 2  MEWS RR 0  MEWS LOC 0  MEWS Score 4  MEWS Score Color Red  Assess: if the MEWS score is Yellow or Red  Were vital signs accurate and taken at a resting state? Yes  Does the patient meet 2 or more of the SIRS criteria? Yes  Does the patient have a confirmed or suspected source of infection? No  MEWS guidelines implemented  Yes, red  Treat  MEWS Interventions Considered administering scheduled or prn medications/treatments as ordered  Take Vital Signs  Increase Vital Sign Frequency  Red: Q1hr x2, continue Q4hrs until patient remains green for 12hrs  Escalate  MEWS: Escalate Red: Discuss with charge nurse and notify provider. Consider notifying RRT. If remains red for 2 hours consider need for higher level of care  Notify: Charge Nurse/RN  Name of Charge Nurse/RN Notified Lauren, RN  Provider Notification  Provider Name/Title Lynwood Kipper, NP  Date Provider Notified 02/22/24  Assess: SIRS CRITERIA  SIRS Temperature  1  SIRS Respirations  0  SIRS Pulse 1  SIRS WBC 0  SIRS Score Sum  2

## 2024-02-23 NOTE — Evaluation (Signed)
 Physical Therapy Evaluation Patient Details Name: [name] [name] MRN: [id number] DOB: 08-01-1935 Today's Date: 02/23/2024  History of Present Illness  Pt is 89 yo female admitted on 02/21/24 with fever of unknown origin. Pt with hx including but not limited to RA, dementia, HLD, HTN, hypothyroidism, leukemia  Clinical Impression  Pt admitted with above diagnosis. At baseline, pt lives in town home alone with family assistance as needed.  Family also has monitors installed throughout home to check on pt.  Today, pt ambulating 88' with cane and CGA and needed min A for transfers. She did fatigue easily.  Pt expected to progress well.  Would recommend HHPT at d/c with initial increase in family supervision.  Also, son mentioned possible ALF in future - this also would be beneficial to pt. Pt currently with functional limitations due to the deficits listed below (see PT Problem List). Pt will benefit from acute skilled PT to increase their independence and safety with mobility to allow discharge.           If plan is discharge home, recommend the following: A little help with walking and/or transfers;A little help with bathing/dressing/bathroom;Assistance with cooking/housework;Help with stairs or ramp for entrance   Can travel by private vehicle        Equipment Recommendations None recommended by PT  Recommendations for Other Services       Functional Status Assessment Patient has had a recent decline in their functional status and demonstrates the ability to make significant improvements in function in a reasonable and predictable amount of time.     Precautions / Restrictions Precautions Precautions: Fall      Mobility  Bed Mobility Overal bed mobility: Needs Assistance Bed Mobility: Supine to Sit     Supine to sit: Contact guard     General bed mobility comments: increased time and cues    Transfers Overall transfer level: Needs assistance Equipment used: Straight  cane Transfers: Sit to/from Stand Sit to Stand: Min assist           General transfer comment: Light min A. Stood from bed and toilet.  Initial stand from bed with posterior bias.    Ambulation/Gait Ambulation/Gait assistance: Contact guard assist Gait Distance (Feet): 75 Feet Assistive device: Straight cane Gait Pattern/deviations: Step-to pattern, Decreased stride length Gait velocity: decreased     General Gait Details: CGA for safety  Stairs            Wheelchair Mobility     Tilt Bed    Modified Rankin (Stroke Patients Only)       Balance Overall balance assessment: Needs assistance Sitting-balance support: No upper extremity supported Sitting balance-Leahy Scale: Good     Standing balance support: No upper extremity supported, Single extremity supported Standing balance-Leahy Scale: Fair Standing balance comment: Could stand at sink for ADLs without UE support; generally using cane to ambulate but did lift off ground and take a few steps without cane use at times                             Pertinent Vitals/Pain Pain Assessment Pain Assessment: No/denies pain    Home Living Family/patient expects to be discharged to:: Private residence Living Arrangements: Alone Available Help at Discharge: Family;Available PRN/intermittently Type of Home: House Home Access: Level entry       Home Layout: One level Home Equipment: Grab bars - tub/shower;Cane - single Librarian, academic (2 wheels);Shower seat Additional Comments:  Has cameras throughout house; has RW and shower seat but does not use    Prior Function Prior Level of Function : Independent/Modified Independent             Mobility Comments: Walks with cane; ambulates short community distances ; denies falls ADLs Comments: independent adls and light iadls; family assist with iadls, shopping, driving     Extremity/Trunk Assessment   Upper Extremity Assessment Upper  Extremity Assessment: Generalized weakness    Lower Extremity Assessment Lower Extremity Assessment: LLE deficits/detail;RLE deficits/detail RLE Deficits / Details: ROM WFL; MMT grossly 4/5 LLE Deficits / Details: ROM WFL; MMT grossly 4/5    Cervical / Trunk Assessment Cervical / Trunk Assessment: Normal  Communication        Cognition Arousal: Alert Behavior During Therapy: WFL for tasks assessed/performed   PT - Cognitive impairments: History of cognitive impairments                       PT - Cognition Comments: Son reports cognition near baseline.  Pt oriented to self and place.  Does need increased time and cues for initiation         Cueing       General Comments General comments (skin integrity, edema, etc.): VSS but easily fatigued. Son, [name], present    Exercises     Assessment/Plan    PT Assessment Patient needs continued PT services  PT Problem List Decreased strength;Pain;Decreased range of motion;Decreased activity tolerance;Decreased balance;Decreased mobility;Decreased knowledge of use of DME;Decreased cognition;Decreased safety awareness       PT Treatment Interventions DME instruction;Therapeutic exercise;Gait training;Balance training;Stair training;Functional mobility training;Therapeutic activities;Patient/family education;Neuromuscular re-education;Cognitive remediation    PT Goals (Current goals can be found in the Care Plan section)  Acute Rehab PT Goals Patient Stated Goal: return home; son did reports may be looking into ALF soon PT Goal Formulation: With patient/family Time For Goal Achievement: 03/08/24 Potential to Achieve Goals: Good    Frequency Min 2X/week     Co-evaluation               AM-PAC PT 6 Clicks Mobility  Outcome Measure Help needed turning from your back to your side while in a flat bed without using bedrails?: A Little Help needed moving from lying on your back to sitting on the side of a flat bed  without using bedrails?: A Little Help needed moving to and from a bed to a chair (including a wheelchair)?: A Little Help needed standing up from a chair using your arms (e.g., wheelchair or bedside chair)?: A Little Help needed to walk in hospital room?: A Little Help needed climbing 3-5 steps with a railing? : A Little 6 Click Score: 18    End of Session Equipment Utilized During Treatment: Gait belt Activity Tolerance: Patient tolerated treatment well Patient left: with chair alarm set;in chair;with call bell/phone within reach Nurse Communication: Mobility status PT Visit Diagnosis: Other abnormalities of gait and mobility (R26.89);Muscle weakness (generalized) (M62.81)    Time: 8778-8742 PT Time Calculation (min) (ACUTE ONLY): 36 min   Charges:   PT Evaluation $PT Eval Low Complexity: 1 Low PT Treatments $Gait Training: 8-22 mins PT General Charges $$ ACUTE PT VISIT: 1 Visit         Benjiman, PT Acute Rehab [facility] [phone]   Benjiman VEAR Mulberry 02/23/2024, 1:55 PM

## 2024-02-23 NOTE — Plan of Care (Incomplete)
  Problem: Education: Goal: Knowledge of General Education information will improve Description: Including pain rating scale, medication(s)/side effects and non-pharmacologic comfort measures Outcome: Progressing   Problem: Health Behavior/Discharge Planning: Goal: Ability to manage health-related needs will improve Outcome: Progressing   Problem: Clinical Measurements: Goal: Ability to maintain clinical measurements within normal limits will improve Outcome: Progressing Goal: Will remain free from infection Outcome: Progressing Goal: Diagnostic test results will improve Outcome: Progressing   Problem: Activity: Goal: Risk for activity intolerance will decrease Outcome: Progressing   Problem: Nutrition: Goal: Adequate nutrition will be maintained Outcome: Progressing   Problem: Safety: Goal: Ability to remain free from injury will improve Outcome: Progressing   Problem: Clinical Measurements: Goal: Respiratory complications will improve Outcome: Adequate for Discharge Goal: Cardiovascular complication will be avoided Outcome: Adequate for Discharge   Problem: Coping: Goal: Level of anxiety will decrease Outcome: Adequate for Discharge   Problem: Elimination: Goal: Will not experience complications related to bowel motility Outcome: Adequate for Discharge Goal: Will not experience complications related to urinary retention Outcome: Adequate for Discharge   Problem: Pain Managment: Goal: General experience of comfort will improve and/or be controlled Outcome: Adequate for Discharge   Problem: Skin Integrity: Goal: Risk for impaired skin integrity will decrease Outcome: Adequate for Discharge

## 2024-02-23 NOTE — Consult Note (Signed)
 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 Hemodynamics within baseline/hypertensive range Pan body ct no acute abnormality   Lft's normal No eosinophilia on cbc differential; no prolonged severe neutropenia; smear review morphology unremarkable with smudge cells present and reactive lymphocytes Wbc up/down/up with mild thrombocytopenia   7/20 bcx negative 7/22 respiratory viral pcr in process   Oncology following:  Initial id formulation:  No obvious localizing sx in terms of symptom/imaging.  Her net immunosuppressant doesn't suggest any particular OI process. And epidemiology speaking viral uri remains highest on ddx At this time I do not see need to pursue endemic fungi or atypical infection  Mild thrombocytopenia on presentation ?sepsis related. Lft is normal and no gi sx or rash/echar to suggest a tick related process although earlier tick related bacterial illness might not have all sx. Reasonable to give doxy as higher mortality with delayed tx  Plan: F/u blood culture F/u respiratory viral pcr Stop azith/cefepime /vanc Maintain droplet isolation precaution Doxycycline  Tick serology Supportive care otherwise     ------------------------------------------------ Principal Problem:   FUO (fever of unknown origin) Active Problems:   Dyslipidemia   Essential hypertension   Rheumatoid arthritis (HCC)   History of hypothyroidism   Chronic kidney disease (CKD) stage G3b   Paroxysmal supraventricular tachycardia (HCC)   Iron deficiency anemia   Large granular  lymphocytic leukemia (HCC)   Dehydration   Murmur   Chronic neutropenia (HCC)    HPI: ANALIA ZUK is a 89 y.o. female ra and LGLL on leflunomide , dementia, admitted on 7/20 for acute sob, dry cough, malaise/weakness, found to have sepsis unclear etiology   Onc hx: Large granular lymphocytic leukemia Dx'ed 07/2021 Started on methotrexate  20 mg po weekly 07/2021-11/2022 (pancytopenia) Getting g-csf every 2-3 weeks  RA: Leflunomide    Patient ?dementia. Hx via son who was available at bedside  Patient lives in a well kept retirement home, mostly in door. She does goes outside  She lives alone. Her brother and son checks in daily and there are camera in her house for review  She has had a week of sleeping more and decreased intake, including mild decrement in cognition She did complains of headache as well and dry cough No other sx including n/v/d, rash, joint pain, dysuria  No known sick contact   Due to this progression of sx her brother took her to [facility] where she was found to have sepsis and admitted Febrile up to 103 Wbc 14 --> 10-->13 Platelet 140s --> 100s Reactive lymphocyte on smear Normal lft on hd0 and hd#1 Cr baseline 1.1's  Started on bsAbx Bcx ngtd Respiratory viral pcr negative Ct chest does suggest nonspecific airway thickening and reactive LAD; no intraabd acute process  She said she is feeling better Last fever early this morning 7/22     Family History  Problem Relation Age of Onset   Diabetes Father    Heart disease Father    Cancer Sister        breast    Heart disease Sister  Heart attack Brother    Heart attack Brother    Heart attack Brother     Social History   Tobacco Use   Smoking status: Never    Passive exposure: Never   Smokeless tobacco: Never  Vaping Use   Vaping status: Never Used  Substance Use Topics   Alcohol use: No   Drug use: No    Allergies  Allergen Reactions   Tramadol  Nausea Only   Codeine  Phosphate Nausea Only    Review of Systems: ROS All Other ROS was negative, except mentioned above   Past Medical History:  Diagnosis Date   Alzheimer disease (HCC)    dx by Dr. Berneta per patient's son.   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   Rheumatoid arthritis(714.0) 01/19/2007       Scheduled Meds:  benazepril   10 mg Oral Daily   donepezil   5 mg Oral QHS   fluticasone   2 spray Each Nare Daily   folic acid   1 mg Oral Daily   guaiFENesin   1,200 mg Oral BID   heparin   5,000 Units Subcutaneous Q8H   levothyroxine   88 mcg Oral Q0600   loratadine   10 mg Oral Daily   megestrol   400 mg Oral Daily   pantoprazole   40 mg Oral Q0600   sargramostim   250 mcg Subcutaneous Daily   Continuous Infusions:  sodium chloride  75 mL/hr at 02/23/24 1048   azithromycin  500 mg (02/22/24 1644)   ceFEPime  (MAXIPIME ) IV 2 g (02/23/24 1049)   vancomycin      PRN Meds:.acetaminophen  **OR** [DISCONTINUED] acetaminophen , albuterol , guaiFENesin -dextromethorphan, ondansetron  **OR** ondansetron  (ZOFRAN ) IV   OBJECTIVE: Blood pressure 120/75, pulse (!) 103, temperature 99.6 F (37.6 C), temperature source Oral, resp. rate 20, height 5' 1.5 (1.562 m), weight 54.2 kg, SpO2 97%.  Physical Exam  General/constitutional: no distress, pleasant HEENT: Normocephalic, PER, Conj Clear, EOMI, Oropharynx clear Neck supple CV: rrr no mrg Lungs: clear to auscultation, normal respiratory effort Abd: Soft, Nontender Ext: no edema Skin: No Rash Neuro: nonfocal MSK: no peripheral joint swelling/tenderness/warmth; back spines nontender    Lab Results Lab Results  Component Value Date   WBC 13.3 (H) 02/23/2024   HGB 9.5 (L) 02/23/2024   HCT 31.7 (L) 02/23/2024   MCV 83.6 02/23/2024   PLT 107 (L) 02/23/2024    Lab Results  Component Value Date    CREATININE 1.16 (H) 02/23/2024   BUN 17 02/23/2024   NA 135 02/23/2024   K 3.7 02/23/2024   CL 108 02/23/2024   CO2 19 (L) 02/23/2024    Lab Results  Component Value Date   ALT 12 02/22/2024   AST 22 02/22/2024   ALKPHOS 64 02/22/2024   BILITOT 0.9 02/22/2024      Microbiology: Recent Results (from the past 240 hours)  Culture, blood (Routine x 2)     Status: None (Preliminary result)   Collection Time: 02/21/24  4:34 PM   Specimen: BLOOD  Result Value Ref Range Status   Specimen Description   Final    BLOOD LEFT ANTECUBITAL Performed at [facility], [address] [place], [place] [address]    Special Requests   Final    BOTTLES DRAWN AEROBIC AND ANAEROBIC Blood Culture adequate volume Performed at [facility], [address] [place], [place] [address]    Culture   Final    NO GROWTH  2 DAYS Performed at [facility], [address] [place], [place] [address]    Report Status PENDING  Incomplete  Culture, blood (Routine x 2)     Status: None (Preliminary result)   Collection Time: 02/21/24  5:13 PM   Specimen: BLOOD  Result Value Ref Range Status   Specimen Description   Final    BLOOD RIGHT ANTECUBITAL Performed at [facility], [address] [place], [place] [address]    Special Requests   Final    BOTTLES DRAWN AEROBIC AND ANAEROBIC Blood Culture results may not be optimal due to an inadequate volume of blood received in culture bottles Performed at [facility], [address], [place], [place] [address]    Culture   Final    NO GROWTH 2 DAYS Performed at [facility], [address], [place], [place] [address]    Report Status PENDING  Incomplete  Resp panel by RT-PCR (RSV, Flu A&B, Covid) Anterior Nasal Swab     Status: None   Collection Time: 02/21/24  7:29 PM   Specimen: Anterior Nasal Swab  Result Value Ref Range Status   SARS Coronavirus 2 by RT PCR  NEGATIVE NEGATIVE Final    Comment: (NOTE) SARS-CoV-2 target nucleic acids are NOT DETECTED.  The SARS-CoV-2 RNA is generally detectable in upper respiratory specimens during the acute phase of infection. The lowest concentration of SARS-CoV-2 viral copies this assay can detect is 138 copies/mL. A negative result does not preclude SARS-Cov-2 infection and should not be used as the sole basis for treatment or other patient management decisions. A negative result may occur with  improper specimen collection/handling, submission of specimen other than nasopharyngeal swab, presence of viral mutation(s) within the areas targeted by this assay, and inadequate number of viral copies(<138 copies/mL). A negative result must be combined with clinical observations, patient history, and epidemiological information. The expected result is Negative.  Fact Sheet for Patients:  BloggerCourse.com  Fact Sheet for Healthcare Providers:  SeriousBroker.it  This test is no t yet approved or cleared by the [place]  FDA and  has been authorized for detection and/or diagnosis of SARS-CoV-2 by FDA under an Emergency Use Authorization (EUA). This EUA will remain  in effect (meaning this test can be used) for the duration of the COVID-19 declaration under Section 564(b)(1) of the Act, 21 U.S.C.section 360bbb-3(b)(1), unless the authorization is terminated  or revoked sooner.       Influenza A by PCR NEGATIVE NEGATIVE Final   Influenza B by PCR NEGATIVE NEGATIVE Final    Comment: (NOTE) The Xpert Xpress SARS-CoV-2/FLU/RSV plus assay is intended as an aid in the diagnosis of influenza from Nasopharyngeal swab specimens and should not be used as a sole basis for treatment. Nasal washings and aspirates are unacceptable for Xpert Xpress SARS-CoV-2/FLU/RSV testing.  Fact Sheet for Patients: BloggerCourse.com  Fact Sheet for  Healthcare Providers: SeriousBroker.it  This test is not yet approved or cleared by the [place]  FDA and has been authorized for detection and/or diagnosis of SARS-CoV-2 by FDA under an Emergency Use Authorization (EUA). This EUA will remain in effect (meaning this test can be used) for the duration of the COVID-19 declaration under Section 564(b)(1) of the Act, 21 U.S.C. section 360bbb-3(b)(1), unless the authorization is terminated or revoked.     Resp Syncytial Virus by PCR NEGATIVE NEGATIVE Final    Comment: (NOTE) Fact Sheet for Patients: BloggerCourse.com  Fact Sheet for Healthcare Providers: SeriousBroker.it  This test is not yet approved or cleared by the [place]  FDA and has been authorized for detection and/or diagnosis of SARS-CoV-2 by FDA under an Emergency Use Authorization (EUA). This EUA will remain in effect (meaning this test can be used) for the duration of the COVID-19 declaration under Section 564(b)(1) of the Act, 21 U.S.C. section 360bbb-3(b)(1), unless the authorization is terminated or revoked.  Performed at [facility], [address] [place], [place] [address]      Serology:    Imaging: If present, new imagings (plain films, ct scans, and mri) have been personally visualized and interpreted; radiology reports have been reviewed. Decision making incorporated into the Impression / Recommendations.   7/21 ct abd pelv chest wwo contrast  No intraabd acute findings or other significant abnormality.   Bilateral upper lobe pulmonary nodules measuring up to 4 mm, stable to prior dated back to September 24, 2022. Follow-up in 1 year to ensure stability.   Mild bibasilar atelectasis and Peri osteophyte fibrosis along posteromedial right lower lobe. No consolidation.   Diffuse bronchial and bronchiolar wall thickening suggestive of medium and  large size airway disease.   Multi station mediastinal lymph nodes, likely reactive.   Constance ONEIDA Passer, MD [facility] [phone] pager    02/23/2024, 2:09 PM

## 2024-02-23 NOTE — Progress Notes (Signed)
 PROGRESS NOTE    [name] [name]  FMW:[id number] DOB: 1935/03/25 DOA: 02/21/2024 PCP: [name], MD    Chief Complaint  Patient presents with   Cough    Brief Narrative:  Patient 89 year old female history of rheumatoid arthritis, dementia, hyperlipidemia, hypertension, hypothyroidism, leukemia presented to [facility] due to shortness of breath, cough, decreased intake and lethargy.  Patient also noted to have a fever on presentation with a temp of 101.7.  Chest x-ray done unremarkable.  Patient received a dose of IV Vanco and cefepime .  Patient admitted due to concerns of fever of unknown origin.  Patient pancultured.  Patient placed empirically on cefepime  and azithromycin .  CT abdomen and pelvis and CT chest done with no source of fever.  Due to ongoing fevers despite empiric IV antibiotics ID consulted.  Oncology following.   Assessment & Plan:   Principal Problem:   FUO (fever of unknown origin) Active Problems:   Dyslipidemia   Essential hypertension   Rheumatoid arthritis (HCC)   History of hypothyroidism   Chronic kidney disease (CKD) stage G3b   Paroxysmal supraventricular tachycardia (HCC)   Iron deficiency anemia   Large granular lymphocytic leukemia (HCC)   Dehydration   Murmur   Chronic neutropenia (HCC)  #1 fever/history of chronic neutropenia on G-CSF - Patient noted to have presented with fever noted in the ED to have a temp of 102.5.??  Tumor fever - Patient also with a leukocytosis, however patient on G-CSF. - Patient presented with respiratory complaints of shortness of breath, cough. - Chest x-ray done with no acute infiltrate noted. - CT chest, CT abdomen and pelvis ordered with no acute abnormalities.  - Blood cultures pending. - SARS coronavirus 2 by PCR negative, influenza A and B by PCR negative, RSV by PCR negative. -Respiratory viral panel ordered this morning and negative. - Continue Flonase , Mucinex , Claritin , Protonix . -  Continue cefepime , azithromycin .   - Due to ongoing fevers with no source we will broaden antibiotics with addition of vancomycin  and consult with ID for further evaluation and management.  - Supportive care.  2.  Dehydration -IV fluids.  3.  Rheumatoid arthritis -Continue to hold Arava  and resume after completion of antibiotic course.  4.  Alzheimer's dementia - Continue Aricept .   5.  Hypothyroidism - Continue Synthroid .   6.  Iron deficiency anemia -H&H stable at 9.5. - Follow. - Transfuse for Hgb < 8.  7.  Large granular lymphocytic leukemia/chronic neutropenia -Informed oncology via epic of admission. -On GM-CSF every 2-3 weeks. -Patient started on Leukine  per oncology. -Oncology following.  8.  Hypertension - Continue Lotensin .    9.  Hyperlipidemia -Outpatient follow-up with PCP.  10.  Murmur -Patient with murmur noted on examination. - 2D echo from 12/06/2020 with mild aortic valve stenosis. - Repeat 2D echo this hospitalization with a EF of 60 to 65%, and WMA, severe asymmetric LVH of the basal septal segment.  Mild MVR.  Mild to moderate AVR.  Mild aortic valve stenosis.  - Outpatient follow-up pending hospital course.  11.  CKD stage IIIb -Stable.    DVT prophylaxis: Heparin  Code Status: Full Family Communication: Updated patient and son at bedside.  Disposition: TBD  Status is: Inpatient Remains inpatient appropriate because: Severity of illness.   Consultants:  Oncology: Dr. Timmy ID   Procedures:  Chest x-ray 02/21/2024 CT chest/CT abdomen and pelvis 02/22/2024 2D echo 02/23/2024  Antimicrobials:  Anti-infectives (From admission, onward)    Start  Dose/Rate Route Frequency Ordered Stop   02/23/24 2200  vancomycin  (VANCOCIN ) IVPB 1000 mg/200 mL premix        1,000 mg 200 mL/hr over 60 Minutes Intravenous Every 48 hours 02/23/24 0834     02/22/24 2200  ceFEPIme  (MAXIPIME ) 2 g in sodium chloride  0.9 % 100 mL IVPB  Status:  Discontinued         2 g 200 mL/hr over 30 Minutes Intravenous Every 12 hours 02/22/24 0845 02/22/24 0856   02/22/24 2000  ceFEPIme  (MAXIPIME ) 2 g in sodium chloride  0.9 % 100 mL IVPB  Status:  Discontinued        2 g 200 mL/hr over 30 Minutes Intravenous Every 12 hours 02/22/24 0839 02/22/24 0845   02/22/24 1700  azithromycin  (ZITHROMAX ) 500 mg in sodium chloride  0.9 % 250 mL IVPB        500 mg 250 mL/hr over 60 Minutes Intravenous Every 24 hours 02/22/24 0437     02/22/24 1200  ceFEPIme  (MAXIPIME ) 2 g in sodium chloride  0.9 % 100 mL IVPB        2 g 200 mL/hr over 30 Minutes Intravenous Every 12 hours 02/22/24 0856     02/22/24 0845  ceFEPIme  (MAXIPIME ) 2 g in sodium chloride  0.9 % 100 mL IVPB  Status:  Discontinued        2 g 200 mL/hr over 30 Minutes Intravenous STAT 02/22/24 0836 02/22/24 0855   02/22/24 0000  ceFEPIme  (MAXIPIME ) 2 g in sodium chloride  0.9 % 100 mL IVPB        2 g 200 mL/hr over 30 Minutes Intravenous  Once 02/21/24 2357 02/22/24 0131   02/22/24 0000  vancomycin  (VANCOCIN ) IVPB 1000 mg/200 mL premix        1,000 mg 200 mL/hr over 60 Minutes Intravenous  Once 02/21/24 2357 02/22/24 1440   02/21/24 1700  cefTRIAXone  (ROCEPHIN ) 1 g in sodium chloride  0.9 % 100 mL IVPB        1 g 200 mL/hr over 30 Minutes Intravenous  Once 02/21/24 1656 02/21/24 1917   02/21/24 1700  azithromycin  (ZITHROMAX ) 500 mg in sodium chloride  0.9 % 250 mL IVPB        500 mg 250 mL/hr over 60 Minutes Intravenous  Once 02/21/24 1656 02/21/24 1917         Subjective: Patient laying in bed with a towel on her head.  Denies any chest pain, no significant shortness of breath.  Some improvement with cough.  Son at bedside stated patient with poor oral intake with breakfast this morning.  Patient still with ongoing fevers.    Objective: Vitals:   02/23/24 0101 02/23/24 0510 02/23/24 0643 02/23/24 1000  BP: 131/64 (!) 145/66  (!) 155/59  Pulse:  100  (!) 105  Resp: 16 15  18   Temp: 99.9 F (37.7 C) 98.4 F  (36.9 C)  (!) 100.7 F (38.2 C)  TempSrc: Oral Oral  Oral  SpO2: 95% 100%  98%  Weight:   54.2 kg   Height:        Intake/Output Summary (Last 24 hours) at 02/23/2024 1204 Last data filed at 02/23/2024 0643 Gross per 24 hour  Intake 1701.05 ml  Output 700 ml  Net 1001.05 ml   Filed Weights   02/22/24 0900 02/23/24 0643  Weight: 51.4 kg 54.2 kg    Examination:  General exam: Appears calm and comfortable.  Dry mucous membranes. Respiratory system: Left coarse basilar breath sounds.  No wheezing.  Fair  air movement.   Cardiovascular system: RRR. 3/6 SEM.  No JVD, no murmurs rubs or gallops.  No pitting lower extremity edema.  Gastrointestinal system: Abdomen is soft, nontender, nondistended, positive bowel sounds.  No rebound.  No guarding.  Central nervous system: Alert and oriented. No focal neurological deficits. Extremities: Symmetric 5 x 5 power. Skin: No rashes, lesions or ulcers Psychiatry: Judgement and insight appear fair. Mood & affect appropriate.     Data Reviewed: I have personally reviewed following labs and imaging studies  CBC: Recent Labs  Lab 02/21/24 1635 02/22/24 0701 02/23/24 0414  WBC 14.0* 10.5 13.3*  NEUTROABS 1.0*  --  1.1*  HGB 10.5* 9.5* 9.5*  HCT 33.9* 31.0* 31.7*  MCV 82.7 84.0 83.6  PLT 142* 123* 107*    Basic Metabolic Panel: Recent Labs  Lab 02/21/24 1635 02/22/24 0701 02/23/24 0414  NA 137 137 135  K 4.3 3.8 3.7  CL 103 107 108  CO2 21* 19* 19*  GLUCOSE 103* 100* 113*  BUN 21 19 17   CREATININE 1.17* 0.83 1.16*  CALCIUM  8.8* 8.2* 7.8*  MG  --  1.7 1.9  PHOS  --  2.5  --     GFR: Estimated Creatinine Clearance: 26.4 mL/min (A) (by C-G formula based on SCr of 1.16 mg/dL (H)).  Liver Function Tests: Recent Labs  Lab 02/21/24 1635 02/22/24 0701  AST 28 22  ALT 8 12  ALKPHOS 91 64  BILITOT 0.5 0.9  PROT 7.2 6.5  ALBUMIN 3.6 2.9*    CBG: No results for input(s): GLUCAP in the last 168 hours.   Recent  Results (from the past 240 hours)  Culture, blood (Routine x 2)     Status: None (Preliminary result)   Collection Time: 02/21/24  4:34 PM   Specimen: BLOOD  Result Value Ref Range Status   Specimen Description   Final    BLOOD LEFT ANTECUBITAL Performed at [facility], [address], [place], [place] [address]    Special Requests   Final    BOTTLES DRAWN AEROBIC AND ANAEROBIC Blood Culture adequate volume Performed at [facility], [address] [place], [place] [address]    Culture   Final    NO GROWTH 2 DAYS Performed at [facility], [address] [place], [place] [address]    Report Status PENDING  Incomplete  Culture, blood (Routine x 2)     Status: None (Preliminary result)   Collection Time: 02/21/24  5:13 PM   Specimen: BLOOD  Result Value Ref Range Status   Specimen Description   Final    BLOOD RIGHT ANTECUBITAL Performed at [facility], [address], [place], [place] [address]    Special Requests   Final    BOTTLES DRAWN AEROBIC AND ANAEROBIC Blood Culture results may not be optimal due to an inadequate volume of blood received in culture bottles Performed at [facility], [address] [place], [place] [address]    Culture   Final    NO GROWTH 2 DAYS Performed at [facility], [address], [place], [place] [address]    Report Status PENDING  Incomplete  Resp panel by RT-PCR (RSV, Flu A&B, Covid) Anterior Nasal Swab     Status: None   Collection Time: 02/21/24  7:29 PM   Specimen: Anterior Nasal Swab  Result Value Ref Range Status   SARS Coronavirus 2 by RT PCR NEGATIVE NEGATIVE Final    Comment: (NOTE)  SARS-CoV-2 target nucleic acids are NOT DETECTED.  The SARS-CoV-2 RNA is generally detectable in upper respiratory specimens during the acute phase of infection. The lowest concentration of SARS-CoV-2 viral copies this assay can detect is 138 copies/mL. A  negative result does not preclude SARS-Cov-2 infection and should not be used as the sole basis for treatment or other patient management decisions. A negative result may occur with  improper specimen collection/handling, submission of specimen other than nasopharyngeal swab, presence of viral mutation(s) within the areas targeted by this assay, and inadequate number of viral copies(<138 copies/mL). A negative result must be combined with clinical observations, patient history, and epidemiological information. The expected result is Negative.  Fact Sheet for Patients:  BloggerCourse.com  Fact Sheet for Healthcare Providers:  SeriousBroker.it  This test is no t yet approved or cleared by the [place]  FDA and  has been authorized for detection and/or diagnosis of SARS-CoV-2 by FDA under an Emergency Use Authorization (EUA). This EUA will remain  in effect (meaning this test can be used) for the duration of the COVID-19 declaration under Section 564(b)(1) of the Act, 21 U.S.C.section 360bbb-3(b)(1), unless the authorization is terminated  or revoked sooner.       Influenza A by PCR NEGATIVE NEGATIVE Final   Influenza B by PCR NEGATIVE NEGATIVE Final    Comment: (NOTE) The Xpert Xpress SARS-CoV-2/FLU/RSV plus assay is intended as an aid in the diagnosis of influenza from Nasopharyngeal swab specimens and should not be used as a sole basis for treatment. Nasal washings and aspirates are unacceptable for Xpert Xpress SARS-CoV-2/FLU/RSV testing.  Fact Sheet for Patients: BloggerCourse.com  Fact Sheet for Healthcare Providers: SeriousBroker.it  This test is not yet approved or cleared by the [place]  FDA and has been authorized for detection and/or diagnosis of SARS-CoV-2 by FDA under an Emergency Use Authorization (EUA). This EUA will remain in effect (meaning this test can  be used) for the duration of the COVID-19 declaration under Section 564(b)(1) of the Act, 21 U.S.C. section 360bbb-3(b)(1), unless the authorization is terminated or revoked.     Resp Syncytial Virus by PCR NEGATIVE NEGATIVE Final    Comment: (NOTE) Fact Sheet for Patients: BloggerCourse.com  Fact Sheet for Healthcare Providers: SeriousBroker.it  This test is not yet approved or cleared by the [place]  FDA and has been authorized for detection and/or diagnosis of SARS-CoV-2 by FDA under an Emergency Use Authorization (EUA). This EUA will remain in effect (meaning this test can be used) for the duration of the COVID-19 declaration under Section 564(b)(1) of the Act, 21 U.S.C. section 360bbb-3(b)(1), unless the authorization is terminated or revoked.  Performed at [facility], [address], [place], [place] [address]          Radiology Studies: ECHOCARDIOGRAM COMPLETE Result Date: 02/23/2024    ECHOCARDIOGRAM REPORT   Patient Name:   [name] [name] Date of Exam: 02/23/2024 Medical Rec #:  [id number]        Height:       61.5 in Accession #:    [id number]       Weight:       119.4 lb Date of Birth:  Aug 07, 1934        BSA:          1.526 m Patient Age:    89 years         BP:           145/66 mmHg Patient Gender: F  HR:           101 bpm. Exam Location:  Inpatient Procedure: 2D Echo, Cardiac Doppler and Color Doppler (Both Spectral and Color            Flow Doppler were utilized during procedure). Indications:    Murmur  History:        Patient has prior history of Echocardiogram examinations, most                 recent 12/06/2020. Risk Factors:Dyslipidemia and Hypertension.  Sonographer:    Philomena Daring Referring Phys: [id number] Leonell Lobdell V Loretha Ure IMPRESSIONS  1. Left ventricular ejection fraction, by estimation, is 60 to 65%. The left ventricle has normal function. The left ventricle has no regional  wall motion abnormalities. There is severe asymmetric left ventricular hypertrophy of the basal-septal segment. Left ventricular diastolic parameters are indeterminate.  2. Right ventricular systolic function is normal. The right ventricular size is normal. There is moderately elevated pulmonary artery systolic pressure. The estimated right ventricular systolic pressure is 51.2 mmHg.  3. The mitral valve is grossly normal. Mild mitral valve regurgitation. Moderate mitral annular calcification.  4. The aortic valve is tricuspid. There is mild calcification of the aortic valve. Aortic valve regurgitation is moderate to severe. Mild aortic valve stenosis.  5. The inferior vena cava is dilated in size with <50% respiratory variability, suggesting right atrial pressure of 15 mmHg. Comparison(s): Prior images reviewed side by side. Aortic regurgitation has increased. Conclusion(s)/Recommendation(s): 2D Vena Contract not well seen. RVOT Doppler not well interrogated for regurgitant fraction/volume. Consider TEE or CMR for quantification or aortic regurgitation in the future if clinically indicated. FINDINGS  Left Ventricle: Left ventricular ejection fraction, by estimation, is 60 to 65%. The left ventricle has normal function. The left ventricle has no regional wall motion abnormalities. The left ventricular internal cavity size was normal in size. There is  severe asymmetric left ventricular hypertrophy of the basal-septal segment. Left ventricular diastolic parameters are indeterminate. Right Ventricle: The right ventricular size is normal. No increase in right ventricular wall thickness. Right ventricular systolic function is normal. There is moderately elevated pulmonary artery systolic pressure. The tricuspid regurgitant velocity is 3.01 m/s, and with an assumed right atrial pressure of 15 mmHg, the estimated right ventricular systolic pressure is 51.2 mmHg. Left Atrium: Left atrial size was normal in size. Right  Atrium: Right atrial size was normal in size. Pericardium: There is no evidence of pericardial effusion. Mitral Valve: The mitral valve is grossly normal. Moderate mitral annular calcification. Mild mitral valve regurgitation. Tricuspid Valve: The tricuspid valve is normal in structure. Tricuspid valve regurgitation is trivial. No evidence of tricuspid stenosis. Aortic Valve: The aortic valve is tricuspid. There is mild calcification of the aortic valve. Aortic valve regurgitation is moderate to severe. Mild aortic stenosis is present. Aortic valve mean gradient measures 15.0 mmHg. Aortic valve peak gradient measures 27.2 mmHg. Aortic valve area, by VTI measures 1.16 cm. Pulmonic Valve: The pulmonic valve was normal in structure. Pulmonic valve regurgitation is not visualized. No evidence of pulmonic stenosis. Aorta: The aortic root and ascending aorta are structurally normal, with no evidence of dilitation. Venous: The inferior vena cava is dilated in size with less than 50% respiratory variability, suggesting right atrial pressure of 15 mmHg. IAS/Shunts: The atrial septum is grossly normal.  LEFT VENTRICLE PLAX 2D LVIDd:         4.30 cm   Diastology LVIDs:         2.70  cm   LV e' medial:    6.74 cm/s LV PW:         1.00 cm   LV E/e' medial:  24.5 LV IVS:        1.10 cm   LV e' lateral:   4.90 cm/s LVOT diam:     1.70 cm   LV E/e' lateral: 33.7 LV SV:         45 LV SV Index:   30 LVOT Area:     2.27 cm  RIGHT VENTRICLE             IVC RV S prime:     12.10 cm/s  IVC diam: 2.60 cm TAPSE (M-mode): 2.2 cm LEFT ATRIUM             Index        RIGHT ATRIUM           Index LA diam:        2.90 cm 1.90 cm/m   RA Area:     12.10 cm LA Vol (A2C):   37.7 ml 24.70 ml/m  RA Volume:   28.80 ml  18.87 ml/m LA Vol (A4C):   31.1 ml 20.38 ml/m LA Biplane Vol: 34.4 ml 22.54 ml/m  AORTIC VALVE AV Area (Vmax):    1.00 cm AV Area (Vmean):   0.98 cm AV Area (VTI):     1.16 cm AV Vmax:           261.00 cm/s AV Vmean:           173.000 cm/s AV VTI:            0.391 m AV Peak Grad:      27.2 mmHg AV Mean Grad:      15.0 mmHg LVOT Vmax:         114.50 cm/s LVOT Vmean:        74.950 cm/s LVOT VTI:          0.200 m LVOT/AV VTI ratio: 0.51  AORTA Ao Root diam: 3.20 cm Ao Asc diam:  3.00 cm MITRAL VALVE                TRICUSPID VALVE MV Area (PHT): 5.31 cm     TR Peak grad:   36.2 mmHg MV Decel Time: 143 msec     TR Vmax:        301.00 cm/s MV E velocity: 165.00 cm/s MV A velocity: 154.00 cm/s  SHUNTS MV E/A ratio:  1.07         Systemic VTI:  0.20 m                             Systemic Diam: 1.70 cm Stanly Leavens MD Electronically signed by Stanly Leavens MD Signature Date/Time: 02/23/2024/10:58:57 AM    Final    DG CHEST PORT 1 VIEW Result Date: 02/23/2024 CLINICAL DATA:  Shortness of breath EXAM: PORTABLE CHEST 1 VIEW COMPARISON:  02/21/2024 FINDINGS: Reverse lordotic projection. Airway thickening is present, suggesting bronchitis or reactive airways disease. Subtle reticular interstitial accentuation without Kerley B lines are cardiomegaly. No blunting of the costophrenic angles. Lower cervical plate and screw fixator. Thoracic spondylosis. Calcific tendinopathy along the rotator cuff region bilaterally. IMPRESSION: 1. Airway thickening is present, suggesting bronchitis or reactive airways disease. 2. Subtle reticular interstitial accentuation without Kerley B lines or cardiomegaly. Early atypical pneumonia not excluded. 3. Thoracic spondylosis. 4. Calcific tendinopathy along the  rotator cuff region bilaterally. Electronically Signed   By: Ryan Salvage M.D.   On: 02/23/2024 09:19   CT ABDOMEN PELVIS W WO CONTRAST Result Date: 02/22/2024 CLINICAL DATA:  Fever.  Sepsis.  Neutropenia. EXAM: CT ABDOMEN AND PELVIS WITHOUT AND WITH CONTRAST TECHNIQUE: Multidetector CT imaging of the abdomen and pelvis was performed following the standard protocol before and following the bolus administration of intravenous contrast.  RADIATION DOSE REDUCTION: This exam was performed according to the departmental dose-optimization program which includes automated exposure control, adjustment of the mA and/or kV according to patient size and/or use of iterative reconstruction technique. CONTRAST:  OMNIPAQUE  IOHEXOL  300 MG/ML  SOLN COMPARISON:  11/03/2022 FINDINGS: Lower Chest: No acute findings. Hepatobiliary: A few small hepatic cysts are again seen. No suspicious hepatic masses identified. Prior cholecystectomy. No evidence of biliary obstruction. Pancreas:  No mass or inflammatory changes. Spleen: Within normal limits in size and appearance. Adrenals/Urinary Tract: No suspicious masses identified. No evidence of ureteral calculi or hydronephrosis. Unremarkable unopacified urinary bladder. Stomach/Bowel: No evidence of obstruction, inflammatory process or abnormal fluid collections. Vascular/Lymphatic: No pathologically enlarged lymph nodes. No acute vascular findings. Reproductive: Prior hysterectomy noted. Adnexal regions are unremarkable in appearance. Other:  None. Musculoskeletal:  No suspicious bone lesions identified. IMPRESSION: No acute findings or other significant abnormality. Electronically Signed   By: Norleen DELENA Kil M.D.   On: 02/22/2024 17:12   CT CHEST WO CONTRAST Result Date: 02/22/2024 CLINICAL DATA:  Fever without source, neutropenia EXAM: CT CHEST WITHOUT CONTRAST TECHNIQUE: Multidetector CT imaging of the chest was performed following the standard protocol without IV contrast. RADIATION DOSE REDUCTION: This exam was performed according to the departmental dose-optimization program which includes automated exposure control, adjustment of the mA and/or kV according to patient size and/or use of iterative reconstruction technique. COMPARISON:  Chest radiograph February 21, 2024, chest CT November 03, 2022 and September 24, 2022 FINDINGS: Cardiovascular: The heart size is normal. Atherosclerotic calcifications of coronary arteries  and aortic annulus. No significant pericardial fluid. Mediastinum/Nodes: Multi station subcentimeter and pericentimeter mediastinal lymph nodes. Lungs/Pleura: Solid noncalcified nodule in left lung apex measuring 4 mm (5/28). Additional nodule in left upper lobe measuring 4 mm likely a prominent vascular structure. (5/59). Stable ground-glass nodule measuring 3 mm in right lobe upper lobe (5/37). There is bronchial and bronchiolar wall thickening with mild bronchiectasis. Bibasilar atelectasis. No significant consolidation or honeycombing. No ground-glass attenuation or significant architectural distortion. Mild peri osteophyte fibrosis along the posteromedial aspect of right lower lobe (5/ 104). No pleural effusion. A Upper Abdomen: Cholecystectomy. Hiatal hernia. Atherosclerotic changes of the aorta. Right hepatic lobe hypodense lesion along the posterior aspect incompletely characterized on current exam. Please refer to same day CT abdomen pelvis. Musculoskeletal: Status post ACDF. Multilevel degenerative changes of the spine. IMPRESSION: Bilateral upper lobe pulmonary nodules measuring up to 4 mm, stable to prior dated back to September 24, 2022. Follow-up in 1 year to ensure stability. Mild bibasilar atelectasis and Peri osteophyte fibrosis along posteromedial right lower lobe. No consolidation. Diffuse bronchial and bronchiolar wall thickening suggestive of medium and large size airway disease. Multi station mediastinal lymph nodes, likely reactive. Electronically Signed   By: Megan  Zare M.D.   On: 02/22/2024 16:18   DG Chest Portable 1 View Result Date: 02/21/2024 CLINICAL DATA:  Short of breath.  Cough. EXAM: PORTABLE CHEST 1 VIEW COMPARISON:  10/31/2022 and older exams. FINDINGS: Cardiac silhouette is normal in size. No mediastinal or hilar masses. Lungs are hyperexpanded, but clear.  No pleural effusion or pneumothorax. Skeletal structures are demineralized, grossly intact. IMPRESSION: No active disease.  Electronically Signed   By: Alm Parkins M.D.   On: 02/21/2024 17:48        Scheduled Meds:  benazepril   10 mg Oral Daily   donepezil   5 mg Oral QHS   fluticasone   2 spray Each Nare Daily   folic acid   1 mg Oral Daily   guaiFENesin   1,200 mg Oral BID   heparin   5,000 Units Subcutaneous Q8H   levothyroxine   88 mcg Oral Q0600   loratadine   10 mg Oral Daily   megestrol   400 mg Oral Daily   pantoprazole   40 mg Oral Q0600   sargramostim   250 mcg Subcutaneous Daily   Continuous Infusions:  sodium chloride  75 mL/hr at 02/23/24 1048   azithromycin  500 mg (02/22/24 1644)   ceFEPime  (MAXIPIME ) IV 2 g (02/23/24 1049)   vancomycin        LOS: 1 day    Time spent: 40 minutes    Toribio Hummer, MD [facility]   To contact the attending provider between 7A-7P or the covering provider during after hours 7P-7A, please log into the web site [email or web address] and access using universal [place] password for that web site. If you do not have the password, please call the hospital operator.  02/23/2024, 12:04 PM

## 2024-02-23 NOTE — Evaluation (Signed)
 Occupational Therapy Evaluation Patient Details Name: [name] [name] MRN: [id number] DOB: 1935-02-26 Today's Date: 02/23/2024   History of Present Illness   Pt is 89 yr old female admitted on 02/21/24 with fever of unknown origin. Pt with hx including but not limited to RA, dementia, HLD, HTN, hypothyroidism, leukemia     Clinical Impressions The pt is currently presenting with the below listed deficits (see OT problem list). At current, she requires CGA for most tasks, including supine to sit, lower body dressing, sit to stand, and ambulating in her room using a RW. Overall, she also appears to be with slight deconditioning and mild generalized weakness. She will benefit from further OT services to maximize her independence with self-care tasks. Home health OT is recommended, as well as intermittent supervision from family. Per her son, he is in the beginning stages of looking into ALF as an option for the pt; this appears to be a very appropriate long-term option.      If plan is discharge home, recommend the following:   Direct supervision/assist for medications management;Direct supervision/assist for financial management;Help with stairs or ramp for entrance;Assist for transportation;Assistance with cooking/housework     Functional Status Assessment   Patient has had a recent decline in their functional status and demonstrates the ability to make significant improvements in function in a reasonable and predictable amount of time.     Equipment Recommendations   None recommended by OT     Recommendations for Other Services         Precautions/Restrictions   Precautions Precautions: Fall Restrictions Weight Bearing Restrictions Per Provider Order: No     Mobility Bed Mobility Overal bed mobility: Needs Assistance       Supine to sit: Contact guard     General bed mobility comments: increased time and cues    Transfers Overall transfer level: Needs  assistance Equipment used: Rolling walker (2 wheels) Transfers: Sit to/from Stand Sit to Stand: Contact guard assist, From elevated surface                  Balance     Sitting balance-Leahy Scale: Good       Standing balance-Leahy Scale: Fair           ADL either performed or assessed with clinical judgement   ADL Overall ADL's : Needs assistance/impaired Eating/Feeding: Independent;Sitting   Grooming: Contact guard assist;Standing           Upper Body Dressing : Set up;Sitting   Lower Body Dressing: Contact guard assist;Sit to/from stand   Toilet Transfer: Contact guard assist;Ambulation   Toileting- Clothing Manipulation and Hygiene: Contact guard assist;Sit to/from stand Toileting - Clothing Manipulation Details (indicate cue type and reason): at bathroom level, based on clinical judgement              Pertinent Vitals/Pain Pain Assessment Pain Assessment: No/denies pain     Extremity/Trunk Assessment Upper Extremity Assessment Upper Extremity Assessment: Overall WFL for tasks assessed   Lower Extremity Assessment Lower Extremity Assessment: Overall WFL for tasks assessed RLE Deficits / Details: ROM WFL; MMT grossly 4/5 LLE Deficits / Details: ROM WFL; MMT grossly 4/5     Communication Communication Communication: No apparent difficulties Factors Affecting Communication: Hearing impaired   Cognition Arousal: Alert Behavior During Therapy: WFL for tasks assessed/performed Cognition: History of cognitive impairments             OT - Cognition Comments: Has a history of dementia. Oriented to person and  place. Disoriented to time. Able to follow 1 step commands without difficulty.                 Following commands: Intact                  Home Living Family/patient expects to be discharged to:: Private residence Living Arrangements: Alone Available Help at Discharge: Family;Available PRN/intermittently Type of Home:  House (townhouse) Home Access: Level entry     Home Layout: One level     Bathroom Shower/Tub: Chief Strategy Officer: Handicapped height     Home Equipment: Grab bars - tub/shower;Cane - single Librarian, academic (2 wheels);Shower seat   Additional Comments: Has cameras throughout house; has RW and shower seat but does not use      Prior Functioning/Environment Prior Level of Function : Independent/Modified Independent             Mobility Comments: Walks with cane; ambulates short community distances ; denies falls ADLs Comments:  (Independent with ADLs, did not drive, only prepared microwave/no stovetop meals, and required reminders to take medication. Does not drive.) She has a brother close by who checks on her and a son who lives ~25 minutes away.     OT Problem List: Decreased strength;Impaired balance (sitting and/or standing);Decreased cognition;Decreased knowledge of use of DME or AE   OT Treatment/Interventions: Self-care/ADL training;Therapeutic exercise;Therapeutic activities;Energy conservation;Patient/family education;DME and/or AE instruction;Balance training      OT Goals(Current goals can be found in the care plan section)   Acute Rehab OT Goals OT Goal Formulation: With patient Time For Goal Achievement: 03/08/24 Potential to Achieve Goals: Good ADL Goals Pt Will Perform Grooming: with supervision;standing Pt Will Perform Lower Body Dressing: with supervision;sitting/lateral leans;sit to/from stand Pt Will Transfer to Toilet: with supervision;ambulating Pt Will Perform Toileting - Clothing Manipulation and hygiene: with supervision;sit to/from stand   OT Frequency:  Min 2X/week    Co-evaluation              AM-PAC OT 6 Clicks Daily Activity     Outcome Measure Help from another person eating meals?: None Help from another person taking care of personal grooming?: A Little Help from another person toileting, which includes  using toliet, bedpan, or urinal?: A Little Help from another person bathing (including washing, rinsing, drying)?: A Little Help from another person to put on and taking off regular upper body clothing?: A Little Help from another person to put on and taking off regular lower body clothing?: A Little 6 Click Score: 19   End of Session Equipment Utilized During Treatment: Rolling walker (2 wheels) Nurse Communication: Mobility status  Activity Tolerance: Patient tolerated treatment well Patient left: in bed;with call bell/phone within reach;with bed alarm set;with family/visitor present  OT Visit Diagnosis: Unsteadiness on feet (R26.81);Muscle weakness (generalized) (M62.81);Other symptoms and signs involving cognitive function                Time: 8497-8467 OT Time Calculation (min): 30 min Charges:  OT General Charges $OT Visit: 1 Visit OT Evaluation $OT Eval Moderate Complexity: 1 Mod OT Treatments $Therapeutic Activity: 8-22 mins    Delanna LITTIE Molt, OTR/L 02/23/2024, 4:57 PM

## 2024-02-23 NOTE — Progress Notes (Signed)
 Pharmacy Antibiotic Note  [name] [name] is a 89 y.o. female admitted on 02/21/2024 with febrile neutropenia.  Pharmacy has been consulted for vancomycin  dosing.  Tmax of 102.9, ANC low at 1.0. WBC elevated. UA negative. Was given one dose of vancomycin  early on 7/21.  Plan: Start vancomycin  1g IV Q48 tonight Continue azithromycin  500mg  Q24h and cefepime  2g IV Q12h Monitor clinical picture, renal function, vanc levels prn F/U C&S, abx deescalation / LOT  Height: 5' 1.5 (156.2 cm) Weight: 54.2 kg (119 lb 6.4 oz) IBW/kg (Calculated) : 48.95  Temp (24hrs), Avg:99.8 F (37.7 C), Min:97.5 F (36.4 C), Max:102.9 F (39.4 C)  Recent Labs  Lab 02/21/24 1635 02/22/24 0701 02/23/24 0414  WBC 14.0* 10.5 13.3*  CREATININE 1.17* 0.83 1.16*  LATICACIDVEN 0.9  --   --     Estimated Creatinine Clearance: 26.4 mL/min (A) (by C-G formula based on SCr of 1.16 mg/dL (H)).    Allergies  Allergen Reactions   Tramadol  Nausea Only   Codeine Phosphate Nausea Only    Antimicrobials this admission: Ceftriaxone  7/20 x 1 Azithromycin  7/20 >> Cefepime  7/21 >>  Vancomycin  7/21 >>  Dose adjustments this admission:   Microbiology results: 7/20 BCx: ngtd 7/20 Resp panel: Flu, Covid, and RSV negative 7/21 Sputum: sent 7/21 Resp panel PCR: sent  Thank you for allowing pharmacy to be a part of this patient's care.  Rankin Dee, PharmD, BCPS, BCIDP Clinical Pharmacist 02/23/2024 8:39 AM

## 2024-02-24 DIAGNOSIS — R509 Fever, unspecified: Secondary | ICD-10-CM | POA: Diagnosis not present

## 2024-02-24 DIAGNOSIS — C91Z0 Other lymphoid leukemia not having achieved remission: Secondary | ICD-10-CM | POA: Diagnosis not present

## 2024-02-24 LAB — CBC WITH DIFFERENTIAL/PLATELET
Abs Immature Granulocytes: 0.02 K/uL (ref 0.00–0.07)
Basophils Absolute: 0 K/uL (ref 0.0–0.1)
Basophils Relative: 0 %
Eosinophils Absolute: 0 K/uL (ref 0.0–0.5)
Eosinophils Relative: 0 %
HCT: 31.6 % — ABNORMAL LOW (ref 36.0–46.0)
Hemoglobin: 9.5 g/dL — ABNORMAL LOW (ref 12.0–15.0)
Immature Granulocytes: 0 %
Lymphocytes Relative: 86 %
Lymphs Abs: 9.5 K/uL — ABNORMAL HIGH (ref 0.7–4.0)
MCH: 25 pg — ABNORMAL LOW (ref 26.0–34.0)
MCHC: 30.1 g/dL (ref 30.0–36.0)
MCV: 83.2 fL (ref 80.0–100.0)
Monocytes Absolute: 0.6 K/uL (ref 0.1–1.0)
Monocytes Relative: 5 %
Neutro Abs: 1 K/uL — ABNORMAL LOW (ref 1.7–7.7)
Neutrophils Relative %: 9 %
Platelets: 135 K/uL — ABNORMAL LOW (ref 150–400)
RBC: 3.8 MIL/uL — ABNORMAL LOW (ref 3.87–5.11)
RDW: 15.7 % — ABNORMAL HIGH (ref 11.5–15.5)
WBC: 11.1 K/uL — ABNORMAL HIGH (ref 4.0–10.5)
nRBC: 0 % (ref 0.0–0.2)

## 2024-02-24 LAB — RENAL FUNCTION PANEL
Albumin: 2.2 g/dL — ABNORMAL LOW (ref 3.5–5.0)
Anion gap: 10 (ref 5–15)
BUN: 16 mg/dL (ref 8–23)
CO2: 12 mmol/L — ABNORMAL LOW (ref 22–32)
Calcium: 7.1 mg/dL — ABNORMAL LOW (ref 8.9–10.3)
Chloride: 113 mmol/L — ABNORMAL HIGH (ref 98–111)
Creatinine, Ser: 0.96 mg/dL (ref 0.44–1.00)
GFR, Estimated: 57 mL/min — ABNORMAL LOW (ref >60)
Glucose, Bld: 92 mg/dL (ref 70–99)
Phosphorus: 1.5 mg/dL — ABNORMAL LOW (ref 2.5–4.6)
Potassium: 3.8 mmol/L (ref 3.5–5.1)
Sodium: 135 mmol/L (ref 135–145)

## 2024-02-24 LAB — LEGIONELLA PNEUMOPHILA SEROGP 1 UR AG: L. pneumophila Serogp 1 Ur Ag: NEGATIVE

## 2024-02-24 MED ORDER — ENSURE PLUS HIGH PROTEIN PO LIQD
237.0000 mL | Freq: Two times a day (BID) | ORAL | Status: DC
  Administered 2024-02-24 – 2024-02-26 (×4): 237 mL via ORAL

## 2024-02-24 MED ORDER — ACYCLOVIR 200 MG PO CAPS
400.0000 mg | ORAL_CAPSULE | Freq: Three times a day (TID) | ORAL | Status: DC
  Administered 2024-02-24 – 2024-02-25 (×4): 400 mg via ORAL
  Filled 2024-02-24 (×5): qty 2

## 2024-02-24 MED ORDER — FLUCONAZOLE 100 MG PO TABS
100.0000 mg | ORAL_TABLET | Freq: Every day | ORAL | Status: DC
  Administered 2024-02-24: 100 mg via ORAL
  Filled 2024-02-24: qty 1

## 2024-02-24 MED ORDER — SODIUM CHLORIDE 0.9 % IV SOLN
1.0000 g | Freq: Two times a day (BID) | INTRAVENOUS | Status: DC
  Administered 2024-02-24: 1 g via INTRAVENOUS
  Filled 2024-02-24 (×2): qty 20

## 2024-02-24 MED ORDER — SODIUM CHLORIDE 0.9 % IV SOLN
1.0000 g | Freq: Two times a day (BID) | INTRAVENOUS | Status: DC
  Filled 2024-02-24: qty 20

## 2024-02-24 MED ORDER — SARGRAMOSTIM 250 MCG IJ SOLR
500.0000 ug | Freq: Every day | INTRAMUSCULAR | Status: DC
  Filled 2024-02-24: qty 2

## 2024-02-24 NOTE — Progress Notes (Signed)
 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 Hemodynamics within baseline/hypertensive range Pan body ct no acute abnormality    Lft's normal No eosinophilia on cbc differential; no prolonged severe neutropenia; smear review morphology unremarkable with smudge cells present and reactive lymphocytes Wbc up/down/up with mild thrombocytopenia     7/20 bcx negative 7/22 respiratory viral pcr in process     Oncology following:   Initial id formulation:  No obvious localizing sx in terms of symptom/imaging.  Her net immunosuppressant doesn't suggest any particular OI process. And epidemiology speaking viral uri remains highest on ddx At this time I do not see need to pursue endemic fungi or atypical infection   Mild thrombocytopenia on presentation ?sepsis related. Lft is normal and no gi sx or rash/echar to suggest a tick related process although earlier tick related bacterial illness might not have all sx. Reasonable to give doxy as higher mortality with delayed tx   -------------- 02/24/24 id assessment Fever resolved Clinically feeling better No rash Doxy started yesterday and platelet rising today; wbc down; still no diarrhea Spotted fever serology ordered / in progress from 7/22  Bcx negative Resp viral pcr negative  Ddx nonspecific viral syndrome vs tick related disease the latter less likekly, however again age/lack of reserve will keep for 5 days doxycycline    Plan: F/u blood culture F/u tick serology (we can do) Finish 5 days doxycycline  on 7/26 Ok to discharge from id standpoint in another 24 hours if  continues to do well Supportive care otherwise  Principal Problem:   FUO (fever of unknown origin) Active Problems:   Dyslipidemia   Essential hypertension   Rheumatoid arthritis (HCC)   History of hypothyroidism   Chronic kidney disease (CKD) stage G3b   Paroxysmal supraventricular tachycardia (HCC)   Iron deficiency anemia   Large granular lymphocytic leukemia (HCC)   Dehydration   Murmur   Chronic neutropenia (HCC)   Immunosuppressed status (HCC)   Allergies  Allergen Reactions   Tramadol  Nausea Only   Codeine Phosphate Nausea Only    Scheduled Meds:  benazepril   10 mg Oral Daily   donepezil   5 mg Oral QHS   doxycycline   100 mg Oral Q12H   feeding supplement  237 mL Oral BID BM   fluticasone   2 spray Each Nare Daily   folic acid   1 mg Oral Daily   guaiFENesin   1,200 mg Oral BID   heparin   5,000 Units Subcutaneous Q8H   levothyroxine   88 mcg Oral Q0600   loratadine   10 mg Oral Daily   megestrol   400 mg Oral Daily   pantoprazole   40 mg Oral Q0600   sargramostim   250 mcg Subcutaneous Daily   Continuous Infusions: PRN Meds:.acetaminophen  **OR** [DISCONTINUED] acetaminophen , albuterol , guaiFENesin -dextromethorphan, ondansetron  **OR** ondansetron  (ZOFRAN ) IV   SUBJECTIVE: Feeling better No rash  No discomfort Fever resolved Platelet improving    Review of Systems: ROS All other ROS was negative, except mentioned above     OBJECTIVE: Vitals:   02/24/24 0511 02/24/24 0600 02/24/24 1000 02/24/24 1341  BP: (!) 150/72  (!) 143/80 136/61  Pulse: (!) 124     Resp: (!) 23 (!) 22 (!) 22 20  Temp: 99.1 F (37.3 C)  98.6 F (37 C) 98.4 F (36.9 C)  TempSrc: Oral  Oral Oral  SpO2: 99%  96% 97%  Weight:      Height:       Body mass index is 22.2 kg/m.  Physical Exam General/constitutional: no distress, pleasant HEENT: Normocephalic, PER, Conj Clear, EOMI, Oropharynx clear Neck supple CV: rrr no mrg Lungs: clear to auscultation, normal respiratory  effort Abd: Soft, Nontender Ext: no edema Skin: No Rash Neuro: nonfocal -- generalized weakness MSK: no peripheral joint swelling/tenderness/warmth; back spines nontender    Lab Results Lab Results  Component Value Date   WBC 11.1 (H) 02/24/2024   HGB 9.5 (L) 02/24/2024   HCT 31.6 (L) 02/24/2024   MCV 83.2 02/24/2024   PLT 135 (L) 02/24/2024    Lab Results  Component Value Date   CREATININE 0.96 02/24/2024   BUN 16 02/24/2024   NA 135 02/24/2024   K 3.8 02/24/2024   CL 113 (H) 02/24/2024   CO2 12 (L) 02/24/2024    Lab Results  Component Value Date   ALT 12 02/22/2024   AST 22 02/22/2024   ALKPHOS 64 02/22/2024   BILITOT 0.9 02/22/2024      Microbiology: Recent Results (from the past 240 hours)  Culture, blood (Routine x 2)     Status: None (Preliminary result)   Collection Time: 02/21/24  4:34 PM   Specimen: BLOOD  Result Value Ref Range Status   Specimen Description   Final    BLOOD LEFT ANTECUBITAL Performed at [facility], [address], [place], [place] [address]    Special Requests   Final    BOTTLES DRAWN AEROBIC AND ANAEROBIC Blood Culture adequate volume Performed at [facility], [address], [place], [place] [address]    Culture   Final    NO GROWTH 3 DAYS Performed at [facility], [address], [place], [place] [address]    Report Status PENDING  Incomplete  Culture, blood (Routine x 2)     Status: None (Preliminary result)   Collection Time: 02/21/24  5:13 PM   Specimen: BLOOD  Result Value Ref Range Status   Specimen Description   Final    BLOOD RIGHT ANTECUBITAL Performed at [facility], [address] [place], [place] [address]    Special Requests   Final    BOTTLES DRAWN AEROBIC AND ANAEROBIC Blood Culture results may not be optimal due to an inadequate volume of blood received in culture bottles Performed at [facility], [address] [place], [place] [address]    Culture   Final    NO GROWTH 3 DAYS Performed at [facility], [address] [place], [place] [address]    Report Status PENDING  Incomplete  Resp panel by RT-PCR (RSV, Flu A&B, Covid) Anterior Nasal Swab     Status: None   Collection Time: 02/21/24  7:29 PM   Specimen: Anterior Nasal Swab  Result Value Ref Range Status   SARS Coronavirus 2 by RT PCR NEGATIVE NEGATIVE Final    Comment: (NOTE) SARS-CoV-2  target nucleic acids are NOT DETECTED.  The SARS-CoV-2 RNA is generally detectable in upper respiratory specimens during the acute phase of infection. The lowest concentration of SARS-CoV-2 viral copies this assay can detect is 138 copies/mL. A negative result does not preclude SARS-Cov-2 infection and should not be used as the sole basis for treatment or other patient management decisions. A negative result may occur with  improper specimen collection/handling, submission of specimen other than nasopharyngeal swab, presence of viral mutation(s) within the areas targeted by this assay, and inadequate number of viral copies(<138 copies/mL). A negative result must be combined with clinical observations, patient history, and epidemiological information. The expected result is Negative.  Fact Sheet for Patients:  BloggerCourse.com  Fact Sheet for Healthcare Providers:  SeriousBroker.it  This test is no t yet approved or cleared by the [place]  FDA and  has been authorized for detection and/or diagnosis of SARS-CoV-2 by FDA under an Emergency Use Authorization (EUA). This EUA will remain  in effect (meaning this test can be used) for the duration of the COVID-19 declaration under Section 564(b)(1) of the Act, 21 U.S.C.section 360bbb-3(b)(1), unless the authorization is terminated  or revoked sooner.       Influenza A by PCR NEGATIVE NEGATIVE Final   Influenza B by PCR NEGATIVE  NEGATIVE Final    Comment: (NOTE) The Xpert Xpress SARS-CoV-2/FLU/RSV plus assay is intended as an aid in the diagnosis of influenza from Nasopharyngeal swab specimens and should not be used as a sole basis for treatment. Nasal washings and aspirates are unacceptable for Xpert Xpress SARS-CoV-2/FLU/RSV testing.  Fact Sheet for Patients: BloggerCourse.com  Fact Sheet for Healthcare Providers: SeriousBroker.it  This test is not yet approved or cleared by the [place]  FDA and has been authorized for detection and/or diagnosis of SARS-CoV-2 by FDA under an Emergency Use Authorization (EUA). This EUA will remain in effect (meaning this test can be used) for the duration of the COVID-19 declaration under Section 564(b)(1) of the Act, 21 U.S.C. section 360bbb-3(b)(1), unless the authorization is terminated or revoked.     Resp Syncytial Virus by PCR NEGATIVE NEGATIVE Final    Comment: (NOTE) Fact Sheet for Patients: BloggerCourse.com  Fact Sheet for Healthcare Providers: SeriousBroker.it  This test is not yet approved or cleared by the [place]  FDA and has been authorized for detection and/or diagnosis of SARS-CoV-2 by FDA under an Emergency Use Authorization (EUA). This EUA will remain in effect (meaning this test can be used) for the duration of the COVID-19 declaration under Section 564(b)(1) of the Act, 21 U.S.C. section 360bbb-3(b)(1), unless the authorization is terminated or revoked.  Performed at [facility], [address] [name], [place] [address]   Respiratory (~20 pathogens) panel by PCR     Status: None   Collection Time: 02/23/24 10:05 AM   Specimen: Nasopharyngeal Swab; Respiratory  Result Value Ref Range Status   Adenovirus NOT DETECTED NOT DETECTED Final   Coronavirus 229E NOT DETECTED NOT DETECTED Final    Comment: (NOTE) The  Coronavirus on the Respiratory Panel, DOES NOT test for the novel  Coronavirus (2019 nCoV)    Coronavirus HKU1 NOT DETECTED NOT DETECTED Final   Coronavirus NL63 NOT DETECTED NOT DETECTED Final   Coronavirus OC43 NOT DETECTED NOT DETECTED Final   Metapneumovirus NOT DETECTED NOT DETECTED Final   Rhinovirus / Enterovirus NOT DETECTED NOT DETECTED Final   Influenza A NOT DETECTED NOT DETECTED Final   Influenza B NOT DETECTED NOT DETECTED Final  Parainfluenza Virus 1 NOT DETECTED NOT DETECTED Final   Parainfluenza Virus 2 NOT DETECTED NOT DETECTED Final   Parainfluenza Virus 3 NOT DETECTED NOT DETECTED Final   Parainfluenza Virus 4 NOT DETECTED NOT DETECTED Final   Respiratory Syncytial Virus NOT DETECTED NOT DETECTED Final   Bordetella pertussis NOT DETECTED NOT DETECTED Final   Bordetella Parapertussis NOT DETECTED NOT DETECTED Final   Chlamydophila pneumoniae NOT DETECTED NOT DETECTED Final   Mycoplasma pneumoniae NOT DETECTED NOT DETECTED Final    Comment: Performed at [facility], [address], [place], [place] [address]     Serology:   Imaging: If present, new imagings (plain films, ct scans, and mri) have been personally visualized and interpreted; radiology reports have been reviewed. Decision making incorporated into the Impression / Recommendations.   7/21 ct abd pelv chest wwo contrast   No intraabd acute findings or other significant abnormality.    Bilateral upper lobe pulmonary nodules measuring up to 4 mm, stable to prior dated back to September 24, 2022. Follow-up in 1 year to ensure stability.   Mild bibasilar atelectasis and Peri osteophyte fibrosis along posteromedial right lower lobe. No consolidation.   Diffuse bronchial and bronchiolar wall thickening suggestive of medium and large size airway disease.   Multi station mediastinal lymph nodes, likely reactive.  Constance ONEIDA Passer, MD [facility] [phone] pager    02/24/2024, 3:31 PM

## 2024-02-24 NOTE — Progress Notes (Signed)
 Progress Note   PatientSOHANA [name] FMW:[id number] DOB: 1934/08/29 DOA: 02/21/2024     2 DOS: the patient was seen and examined on 02/24/2024   Brief hospital course: 89 year old female history of rheumatoid arthritis, dementia, hyperlipidemia, hypertension, hypothyroidism, leukemia presented to [facility] due to shortness of breath, cough, decreased intake and lethargy.  Patient also noted to have a fever on presentation with a temp of 101.7.  Chest x-ray done unremarkable.  Patient received a dose of IV Vanco and cefepime .  Patient admitted due to concerns of fever of unknown origin.  Patient pancultured.  Patient placed empirically on cefepime  and azithromycin .  CT abdomen and pelvis and CT chest done with no source of fever.  Due to ongoing fevers despite empiric IV antibiotics ID consulted.  Oncology following.   Assessment and Plan: #1 fever/history of chronic neutropenia on G-CSF - Patient noted to have presented with fever noted in the ED to have a temp of 102.5.??  Tumor fever - Patient also with a leukocytosis, however patient on G-CSF. - Patient presented with respiratory complaints of shortness of breath, cough. - Chest x-ray done with no acute infiltrate noted. - CT chest, CT abdomen and pelvis ordered with no acute abnormalities.  - Blood cultures pending. - SARS coronavirus 2 by PCR negative, influenza A and B by PCR negative, RSV by PCR negative. -Respiratory viral panel ordered this morning and negative. - Continue Flonase , Mucinex , Claritin , Protonix . - initially continued cefepime , azithromycin  ,vanc - ID consulted. Recs to complete doxycycline  on 7/26. Can d/c from ID standpoint in 24h   2.  Dehydration -IV fluids.   3.  Rheumatoid arthritis -Continue to hold Arava  and resume after completion of antibiotic course.   4.  Alzheimer's dementia - Continue Aricept .    5.  Hypothyroidism - Continue Synthroid .    6.  Iron deficiency anemia -H&H stable at  9.5. - Follow. - Transfuse for Hgb < 8.   7.  Large granular lymphocytic leukemia/chronic neutropenia -Informed oncology via epic of admission. -On GM-CSF every 2-3 weeks. -Patient started on Leukine  per oncology. -Oncology following.   8.  Hypertension - Continue Lotensin .     9.  Hyperlipidemia -Outpatient follow-up with PCP.   10.  Murmur -Patient with murmur noted on examination. - 2D echo from 12/06/2020 with mild aortic valve stenosis. - Repeat 2D echo this hospitalization with a EF of 60 to 65%, and WMA, severe asymmetric LVH of the basal septal segment.  Mild MVR.  Mild to moderate AVR.  Mild aortic valve stenosis.  - Outpatient follow-up pending hospital course.   11.  CKD stage IIIb -Stable.      Subjective: Without complaints  Physical Exam: Vitals:   02/24/24 0511 02/24/24 0600 02/24/24 1000 02/24/24 1341  BP: (!) 150/72  (!) 143/80 136/61  Pulse: (!) 124     Resp: (!) 23 (!) 22 (!) 22 20  Temp: 99.1 F (37.3 C)  98.6 F (37 C) 98.4 F (36.9 C)  TempSrc: Oral  Oral Oral  SpO2: 99%  96% 97%  Weight:      Height:       General exam: Awake, laying in bed, in nad Respiratory system: Normal respiratory effort, no wheezing Cardiovascular system: regular rate, s1, s2 Gastrointestinal system: Soft, nondistended, positive BS Central nervous system: CN2-12 grossly intact, strength intact Extremities: Perfused, no clubbing Skin: Normal skin turgor, no notable skin lesions seen Psychiatry: Mood normal // no visual hallucinations   Data Reviewed:  Na 135, K 3.8, Cr 0.96, WBC 11.1, Hgb 9.5, Plts 135  Family Communication: Pt in room, family at bedside  Disposition: Status is: Inpatient Remains inpatient appropriate because: severity of illness  Planned Discharge Destination: Home    Author: Garnette Pelt, MD 02/24/2024 5:33 PM  For on call review [email or web address].

## 2024-02-24 NOTE — Progress Notes (Signed)
 Occupational Therapy Treatment Patient Details Name: [name] [name] MRN: [id number] DOB: 22-Dec-1934 Today's Date: 02/24/2024   History of present illness Pt is 89 yr old female admitted on 02/21/24 with fever of unknown origin. Pt with hx including but not limited to RA, dementia, HLD, HTN, hypothyroidism, leukemia   OT comments  The pt was seen for functional strengthening and progression of out of bed activity. She required min assist to transfer into sitting edge of bed. She was subsequently assisted to the bedside chair, after she required CGA for short distance ambulation using a RW. She was encouraged to remain seated in the chair, so she could eat her dinner in the upright position. Continue OT plan of care. Home health OT is recommended at discharge.      If plan is discharge home, recommend the following:  Direct supervision/assist for medications management;Direct supervision/assist for financial management;Help with stairs or ramp for entrance;Assist for transportation;Assistance with cooking/housework   Equipment Recommendations  None recommended by OT    Recommendations for Other Services      Precautions / Restrictions Precautions Precautions: Fall Restrictions Weight Bearing Restrictions Per Provider Order: No       Mobility Bed Mobility Overal bed mobility: Needs Assistance Bed Mobility: Supine to Sit     Supine to sit: HOB elevated, Used rails, Min assist          Transfers Overall transfer level: Needs assistance Equipment used: Rolling walker (2 wheels) Transfers: Sit to/from Stand Sit to Stand: Contact guard assist, From elevated surface                 Balance     Sitting balance-Leahy Scale: Good       Standing balance-Leahy Scale: Fair            ADL either performed or assessed with clinical judgement     Vision Baseline Vision/History: 1 Wears glasses     Perception     Praxis     Communication  Communication Communication: No apparent difficulties   Cognition Arousal: Alert Behavior During Therapy: WFL for tasks assessed/performed Cognition: History of cognitive impairments             OT - Cognition Comments: Has a history of dementia. Oriented to person and place. Disoriented to time. Able to follow 1 step commands without difficulty.      Following commands: Intact                      Pertinent Vitals/ Pain       Pain Assessment Pain Assessment: No/denies pain   Frequency  Min 2X/week        Progress Toward Goals  OT Goals(current goals can now be found in the care plan section)  Progress towards OT goals: Progressing toward goals  Acute Rehab OT Goals OT Goal Formulation: With patient Time For Goal Achievement: 03/08/24 Potential to Achieve Goals: Good  Plan         AM-PAC OT 6 Clicks Daily Activity     Outcome Measure   Help from another person eating meals?: None Help from another person taking care of personal grooming?: A Little Help from another person toileting, which includes using toliet, bedpan, or urinal?: A Little Help from another person bathing (including washing, rinsing, drying)?: A Little Help from another person to put on and taking off regular upper body clothing?: None Help from another person to put on and taking off regular lower body clothing?:  A Little 6 Click Score: 20    End of Session Equipment Utilized During Treatment: Gait belt;Rolling walker (2 wheels)  OT Visit Diagnosis: Unsteadiness on feet (R26.81);Muscle weakness (generalized) (M62.81);Other symptoms and signs involving cognitive function   Activity Tolerance Patient tolerated treatment well   Patient Left in chair;with call bell/phone within reach;with chair alarm set;with family/visitor present   Nurse Communication Mobility status        Time: 1545-1601 OT Time Calculation (min): 16 min  Charges: OT General Charges $OT Visit: 1  Visit OT Treatments $Therapeutic Activity: 8-22 mins     Delanna LITTIE Molt, OTR/L 02/24/2024, 4:19 PM

## 2024-02-24 NOTE — Plan of Care (Signed)
  Problem: Education: Goal: Knowledge of General Education information will improve Description: Including pain rating scale, medication(s)/side effects and non-pharmacologic comfort measures Outcome: Progressing   Problem: Health Behavior/Discharge Planning: Goal: Ability to manage health-related needs will improve Outcome: Progressing   Problem: Clinical Measurements: Goal: Will remain free from infection Outcome: Progressing Goal: Diagnostic test results will improve Outcome: Progressing Goal: Respiratory complications will improve Outcome: Progressing Goal: Cardiovascular complication will be avoided Outcome: Progressing   Problem: Activity: Goal: Risk for activity intolerance will decrease Outcome: Progressing   Problem: Coping: Goal: Level of anxiety will decrease Outcome: Progressing   Problem: Elimination: Goal: Will not experience complications related to bowel motility Outcome: Progressing Goal: Will not experience complications related to urinary retention Outcome: Progressing   Problem: Pain Managment: Goal: General experience of comfort will improve and/or be controlled Outcome: Progressing   Problem: Safety: Goal: Ability to remain free from injury will improve Outcome: Progressing   Problem: Skin Integrity: Goal: Risk for impaired skin integrity will decrease Outcome: Progressing   Problem: Clinical Measurements: Goal: Ability to maintain clinical measurements within normal limits will improve Outcome: Not Progressing   Problem: Nutrition: Goal: Adequate nutrition will be maintained Outcome: Not Progressing

## 2024-02-24 NOTE — Progress Notes (Signed)
   02/24/24 1000  Assess: MEWS Score  Temp 98.6 F (37 C)  BP (!) 143/80  MAP (mmHg) 94  ECG Heart Rate (!) 114  Resp (!) 22  SpO2 96 %  O2 Device Room Air  Assess: MEWS Score  MEWS Temp 0  MEWS Systolic 0  MEWS Pulse 2  MEWS RR 1  MEWS LOC 0  MEWS Score 3  MEWS Score Color Yellow  Assess: if the MEWS score is Yellow or Red  Were vital signs accurate and taken at a resting state? Yes  Does the patient meet 2 or more of the SIRS criteria? Yes  Does the patient have a confirmed or suspected source of infection? Yes  MEWS guidelines implemented  No, previously yellow, continue vital signs every 4 hours  Provider Notification  Provider Name/Title Garnette Pelt MD  Date Provider Notified 02/24/24  Time Provider Notified 1000  Method of Notification Page  Notification Reason Other (Comment) (yellow mews, elevated heart rate and respiration rate)  Provider response No new orders  Date of Provider Response 02/24/24  Time of Provider Response 1010  Assess: SIRS CRITERIA  SIRS Temperature  0  SIRS Respirations  1  SIRS Pulse 1  SIRS WBC 0  SIRS Score Sum  2

## 2024-02-24 NOTE — Progress Notes (Signed)
 Overall, everything is about the same.  She still having some temperature spikes.  I think her Tmax was 100.8.  She does have some tachycardia.  Again, her dementia is her biggest problem.  She really is not eating.  She cannot really give me much in the way of any history.  We have her on Leukine .  This has worked in the past.  Today, her CBC shows white cell count 11.1.  Hemoglobin 9.5.  Platelet count is 135,000.  Her ANC is only 1000.   Her cultures are negative.  She had a respiratory panel done that was also unremarkable.  Again, I just do not think she is eating much.  She is having no rashes.  She is having no cough.  She really cannot tell me about any problems with diarrhea.   Her vital signs show temperature of 98.4.  Pulse 124.  Blood pressure 136/61.  Her head and neck exam shows no ocular or oral lesions.  There are no palpable cervical or supraclavicular lymph nodes.  Lungs are clear bilaterally.  Cardiac exam is tachycardic but regular.  She has no murmurs, rubs or bruits.  Abdomen is soft.  There is no palpable liver or spleen tip.  Extremities shows no clubbing, cyanosis or edema.  Skin exam shows no rashes.  We may want to think about changing of her antibiotics.  I may also want to get her on an antifungal and antiviral.  I know there is no localizing signs for her.  I am when I have to really talk to her son about CODE STATUS with her.  She really is not able to do this herself.  I would really hate to see something happen and then she be kept alive artificially.  Again I will try to get a hold of her son tomorrow.  He is very nice.  He is very reasonable.  I do appreciate the great care she is getting from everybody on [address]   Jeralyn Crease, MD  Psalm 56:3

## 2024-02-24 NOTE — Hospital Course (Signed)
 89 year old female history of rheumatoid arthritis, dementia, hyperlipidemia, hypertension, hypothyroidism, leukemia presented to [facility] due to shortness of breath, cough, decreased intake and lethargy.  Patient also noted to have a fever on presentation with a temp of 101.7.  Chest x-ray done unremarkable.  Patient received a dose of IV Vanco and cefepime .  Patient admitted due to concerns of fever of unknown origin.  Patient pancultured.  Patient placed empirically on cefepime  and azithromycin .  CT abdomen and pelvis and CT chest done with no source of fever.  Due to ongoing fevers despite empiric IV antibiotics ID consulted.  Oncology following.

## 2024-02-25 DIAGNOSIS — R509 Fever, unspecified: Secondary | ICD-10-CM | POA: Diagnosis not present

## 2024-02-25 DIAGNOSIS — C91Z0 Other lymphoid leukemia not having achieved remission: Secondary | ICD-10-CM | POA: Diagnosis not present

## 2024-02-25 LAB — COMPREHENSIVE METABOLIC PANEL WITH GFR
ALT: 34 U/L (ref 0–44)
AST: 78 U/L — ABNORMAL HIGH (ref 15–41)
Albumin: 2.2 g/dL — ABNORMAL LOW (ref 3.5–5.0)
Alkaline Phosphatase: 74 U/L (ref 38–126)
Anion gap: 4 — ABNORMAL LOW (ref 5–15)
BUN: 18 mg/dL (ref 8–23)
CO2: 19 mmol/L — ABNORMAL LOW (ref 22–32)
Calcium: 7.9 mg/dL — ABNORMAL LOW (ref 8.9–10.3)
Chloride: 112 mmol/L — ABNORMAL HIGH (ref 98–111)
Creatinine, Ser: 1.1 mg/dL — ABNORMAL HIGH (ref 0.44–1.00)
GFR, Estimated: 49 mL/min — ABNORMAL LOW (ref >60)
Glucose, Bld: 97 mg/dL (ref 70–99)
Potassium: 3.5 mmol/L (ref 3.5–5.1)
Sodium: 135 mmol/L (ref 135–145)
Total Bilirubin: 0.8 mg/dL (ref 0.0–1.2)
Total Protein: 5.9 g/dL — ABNORMAL LOW (ref 6.5–8.1)

## 2024-02-25 LAB — CBC WITH DIFFERENTIAL/PLATELET
Abs Immature Granulocytes: 0.02 K/uL (ref 0.00–0.07)
Basophils Absolute: 0 K/uL (ref 0.0–0.1)
Basophils Relative: 0 %
Eosinophils Absolute: 0.1 K/uL (ref 0.0–0.5)
Eosinophils Relative: 1 %
HCT: 31.6 % — ABNORMAL LOW (ref 36.0–46.0)
Hemoglobin: 9.7 g/dL — ABNORMAL LOW (ref 12.0–15.0)
Immature Granulocytes: 0 %
Lymphocytes Relative: 89 %
Lymphs Abs: 9.3 K/uL — ABNORMAL HIGH (ref 0.7–4.0)
MCH: 25.2 pg — ABNORMAL LOW (ref 26.0–34.0)
MCHC: 30.7 g/dL (ref 30.0–36.0)
MCV: 82.1 fL (ref 80.0–100.0)
Monocytes Absolute: 0.4 K/uL (ref 0.1–1.0)
Monocytes Relative: 4 %
Neutro Abs: 0.6 K/uL — ABNORMAL LOW (ref 1.7–7.7)
Neutrophils Relative %: 6 %
Platelets: 133 K/uL — ABNORMAL LOW (ref 150–400)
RBC: 3.85 MIL/uL — ABNORMAL LOW (ref 3.87–5.11)
RDW: 15.9 % — ABNORMAL HIGH (ref 11.5–15.5)
WBC: 10.5 K/uL (ref 4.0–10.5)
nRBC: 0 % (ref 0.0–0.2)

## 2024-02-25 LAB — SPOTTED FEVER GROUP ANTIBODIES
Spotted Fever Group IgG: <1:64 {titer}
Spotted Fever Group IgM: <1:64 {titer}

## 2024-02-25 MED ORDER — ORAL CARE MOUTH RINSE: 15.0000 mL | OROMUCOSAL | Status: DC | PRN

## 2024-02-25 NOTE — Plan of Care (Signed)

## 2024-02-25 NOTE — Progress Notes (Signed)
 Unfortunately, Ms. Villalva is not responding to Leukine .  As such, I really think that we can probably stop the Leukine .  Her labs show white cell count 10.5.  Hemoglobin 9.7.  Platelet count 133,000.  Her ANC is 600.  She is still really is not eating much.  She still has problems with the dementia.  I really think that the dementia is going to be a problem for her.  I really think that this is going to be the limiting factor with respect to her prognosis.  I had a long talk with her son today.  I called him on the phone.  I told him that I really think that we have to consider Hospice for his mom.  I think that Hospice would be appropriate for her given the fact that she has disease that probably is somewhat resilient to intervention.  She is not a candidate for any aggressive therapy.  I think that Hospice would really help her out.  I would think it would help him out.  He is totally agreeable to Hospice.  I will make sure we get Hospice involved once I know she is discharged.  I have talked with him about her end-of-life issues.  He said that he has talked to her about this in the past.  He said that she would NOT want to be kept alive aerobically.  She would NOT want CPR.  She would NOT want intubation.  I totally agree with this.  I really do not think that if she were put on life support that this would really improve her quality of life or that she would even be able to come off life support.  As such, she is a DO NOT RESUSCITATE.  Again, I know that she has done quite well so far.  She really has had very little issues with respect to the neutropenia.  I think overall problems that she just does not eat.  I do not know if she just does not think about eating.  I know the weight loss is a problem I think this is part of the reason why her bone marrow just does not respond well.  I just want her to have comfort, respect and dignity.  She certainly deserves this.   Currently, her  vital signs are temperature of 97.4.  Pulse 105.  Blood pressure 122/59.  She has no mouth sores.  There is no mucositis.  She has no adenopathy in the neck.  Lungs are clear bilaterally.  Cardiac exam is regular rate and rhythm.  Abdomen is soft.  Bowel sounds are present.  There is no fluid wave.  There is no palpable liver or spleen tip.  Extremity shows no clubbing, cyanosis or edema.  Neurological exam shows no focal neurological deficits.   Ms. 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  We have tried to treat her in the past for this but really has been unsuccessful as she really has had a low tolerance to treatment.  At this point, I really think that we Argun to focus on her comfort.  I know that she is on antibiotics right now.  She has not had a temperature.  I think this is helpful.  Hopefully, she will be able to get home soon.  When she gets home, I will make sure that we get hospice involved.  I do appreciate all the great care that she is getting from  everybody on [address]    Jeralyn Crease, MD  [place] 4:6

## 2024-02-25 NOTE — TOC Initial Note (Signed)
 Transition of Care [facility]) - Initial/Assessment Note    Patient Details  Name: [name] [name] MRN: [id number] Date of Birth: 1935-01-27  Transition of Care [facility]) CM/SW Contact:    [name] CHRISTELLA Eva, LCSW Phone Number: 02/25/2024, 10:28 AM  Clinical Narrative:                  CSW attempted to speak with the pt's son, [name] [name], to discuss recommendations for home health services. There was no answer; CSW left a voicemail requesting a return call. Care Management to follow.  Expected Discharge Plan: Home w Home Health Services Barriers to Discharge: Continued Medical Work up   Patient Goals and CMS Choice            Expected Discharge Plan and Services                                              Prior Living Arrangements/Services                       Activities of Daily Living   ADL Screening (condition at time of admission) Independently performs ADLs?: Yes (appropriate for developmental age) Is the patient deaf or have difficulty hearing?: No Does the patient have difficulty seeing, even when wearing glasses/contacts?: No Does the patient have difficulty concentrating, remembering, or making decisions?: No  Permission Sought/Granted                  Emotional Assessment              Admission diagnosis:  Neutropenic fever (HCC) [D70.9, R50.81] Fever, unspecified fever cause [R50.9] Patient Active Problem List   Diagnosis Date Noted   Chronic neutropenia (HCC) 02/23/2024   Immunosuppressed status (HCC) 02/23/2024   Murmur 02/22/2024   Cognitive decline 09/21/2023   Constipation by delayed colonic transit 11/17/2022   Aphthous ulcer 11/04/2022   FUO (fever of unknown origin) 10/31/2022   Dehydration 10/29/2022   AKI (acute kidney injury) (HCC) 10/28/2022   Pancytopenia (HCC) 10/28/2022   Stomatitis 10/28/2022   Pre-diabetes 10/28/2022   Protein-calorie malnutrition, severe 09/25/2022   Acute diarrhea 09/24/2022    Unintentional weight loss 09/24/2022   Impaired mental alertness 09/24/2022   Acute renal failure superimposed on stage 3a chronic kidney disease (HCC) 09/24/2022   Need for RSV immunization 04/23/2022   Prediabetes 04/23/2022   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   Goals of care, counseling/discussion 08/06/2021   Need for influenza vaccination 04/18/2021   Iron deficiency anemia 11/20/2020   Lymphocytosis 10/16/2020   Neutropenia (HCC) 10/16/2020   Paroxysmal supraventricular tachycardia (HCC) 10/15/2020   Elevated LDL cholesterol level 01/12/2020   DDD (degenerative disc disease), cervical 07/13/2018   Chronic kidney disease (CKD) stage G3b 01/21/2017   History of hypothyroidism 01/20/2017   History of congestive heart failure 08/26/2016   Osteopenia of multiple sites 08/25/2016   DDD cervical spine 08/25/2016   Primary osteoarthritis of both hands 08/25/2016   High risk medication use 06/11/2016   CHF, acute (HCC) 02/01/2013   Methotrexate  lung?? 02/01/2013   Hypothyroidism 01/19/2007   Dyslipidemia 01/19/2007   Essential hypertension 01/19/2007   Rheumatoid arthritis (HCC) 01/19/2007   PCP:  [name] Elsie Sayre, MD Pharmacy:   [id number] - [place], [place] - [name] [place] [address] Phone: [phone] Fax: [phone]  Social Drivers of Health (SDOH) Social History: SDOH Screenings   Food Insecurity: No Food Insecurity (02/22/2024)  Housing: Low Risk  (02/22/2024)  Transportation Needs: No Transportation Needs (02/22/2024)  Utilities: Not At Risk (02/22/2024)  Alcohol Screen: Low Risk  (11/02/2023)  Depression (PHQ2-9): Low Risk  (02/03/2024)  Financial Resource Strain: Low Risk  (11/02/2023)  Physical Activity: Patient Declined (11/02/2023)  Social Connections: Socially Isolated (02/22/2024)  Stress: Patient Unable To Answer (11/02/2023)  Tobacco Use: Low Risk  (02/21/2024)  Health Literacy: Patient  Unable To Answer (11/02/2023)  Recent Concern: Health Literacy - Inadequate Health Literacy (08/14/2023)   SDOH Interventions:     Readmission Risk Interventions    11/04/2022    3:46 PM 09/26/2022   10:27 AM  Readmission Risk Prevention Plan  Transportation Screening Complete Complete  PCP or Specialist Appt within 5-7 Days  Complete  PCP or Specialist Appt within 3-5 Days Complete   Home Care Screening  Complete  Medication Review (RN CM)  Complete  HRI or Home Care Consult Complete   Social Work Consult for Recovery Care Planning/Counseling Complete   Palliative Care Screening Complete   Medication Review Oceanographer) Complete

## 2024-02-25 NOTE — Progress Notes (Signed)
 Physical Therapy Treatment Patient Details Name: [name] [name] MRN: [id number] DOB: [birth year] Today's Date: 02/25/2024   History of Present Illness Pt is 89 yo female admitted on 02/21/24 with fever of unknown origin. Pt with hx including but not limited to RA, dementia, HLD, HTN, hypothyroidism, leukemia    PT Comments  Pt needing min A to come to sitting EOB. Noted to be soiled in liquid BM once sitting EOB, notified RN. Pt able to complete 5 STS reps with static standing for 1-2 minutes while therapist assists with pericare clean up and gown change. Pt needing min A to power up to stand, slow to rise, able to maintain static supported standing with SBA. Pt amb 20 ft in room, slow cadence, minimal bil foot clearance, min A to maneuver RW and steady, declines further amb due to fatigued from STS and standing at beginning of session. [name] present throughout session, offering encouragement, also reports noting a decrease in pt's oral intake and overall energy level over the past week prior to admission.   If plan is discharge home, recommend the following: A little help with walking and/or transfers;A little help with bathing/dressing/bathroom;Assistance with cooking/housework;Help with stairs or ramp for entrance   Can travel by private vehicle        Equipment Recommendations  None recommended by PT    Recommendations for Other Services       Precautions / Restrictions Precautions Precautions: Fall Recall of Precautions/Restrictions: Impaired Restrictions Weight Bearing Restrictions Per Provider Order: No     Mobility  Bed Mobility Overal bed mobility: Needs Assistance Bed Mobility: Supine to Sit     Supine to sit: Min assist     General bed mobility comments: min A to power up to sitting EOB, assist to rise trunk and scoot out to EOB with bedpad    Transfers Overall transfer level: Needs assistance Equipment used: Rolling walker (2 wheels) Transfers: Sit to/from  Stand, Bed to chair/wheelchair/BSC Sit to Stand: Min assist   Step pivot transfers: Min assist       General transfer comment: min A to power up from EOB x5 reps with RW, BLE braced against bed, slow to rise up, cues for hand placement; min A to step pivot to recliner at bedside    Ambulation/Gait Ambulation/Gait assistance: Min assist Gait Distance (Feet): 20 Feet Assistive device: Rolling walker (2 wheels) Gait Pattern/deviations: Step-to pattern, Decreased stride length Gait velocity: decreased     General Gait Details: min A to steady and maneuver RW with amb in room, pt declines amb out of room due to fatigue from cleaning up from bowel movement   Stairs             Wheelchair Mobility     Tilt Bed    Modified Rankin (Stroke Patients Only)       Balance Overall balance assessment: No apparent balance deficits (not formally assessed) Sitting-balance support: Feet supported Sitting balance-Leahy Scale: Good     Standing balance support: Reliant on assistive device for balance, During functional activity, Bilateral upper extremity supported Standing balance-Leahy Scale: Poor                              Communication Communication Communication: No apparent difficulties  Cognition Arousal: Alert Behavior During Therapy: WFL for tasks assessed/performed   PT - Cognitive impairments: History of cognitive impairments  PT - Cognition Comments: son reports pt neat baseline cognitively; pt needing cues for initiation and attention to task Following commands: Intact      Cueing    Exercises      General Comments General comments (skin integrity, edema, etc.): HR 100-120 noted on tele monitor during session      Pertinent Vitals/Pain Pain Assessment Pain Assessment: No/denies pain    Home Living                          Prior Function            PT Goals (current goals can now be found in  the care plan section) Acute Rehab PT Goals Patient Stated Goal: return home; son did reports may be looking into ALF soon PT Goal Formulation: With patient/family Time For Goal Achievement: 03/08/24 Potential to Achieve Goals: Good Progress towards PT goals: Progressing toward goals    Frequency    Min 2X/week      PT Plan      Co-evaluation              AM-PAC PT 6 Clicks Mobility   Outcome Measure  Help needed turning from your back to your side while in a flat bed without using bedrails?: A Little Help needed moving from lying on your back to sitting on the side of a flat bed without using bedrails?: A Little Help needed moving to and from a bed to a chair (including a wheelchair)?: A Little Help needed standing up from a chair using your arms (e.g., wheelchair or bedside chair)?: A Little Help needed to walk in hospital room?: A Little Help needed climbing 3-5 steps with a railing? : A Lot 6 Click Score: 17    End of Session Equipment Utilized During Treatment: Gait belt Activity Tolerance: Patient tolerated treatment well;Patient limited by fatigue Patient left: in chair;with call bell/phone within reach;with chair alarm set;with family/visitor present Nurse Communication: Mobility status;Other (comment) (bowel movement, soiled linen) PT Visit Diagnosis: Other abnormalities of gait and mobility (R26.89);Muscle weakness (generalized) (M62.81)     Time: 8564-8493 PT Time Calculation (min) (ACUTE ONLY): 31 min  Charges:    $Gait Training: 8-22 mins $Therapeutic Activity: 8-22 mins PT General Charges $$ ACUTE PT VISIT: 1 Visit                     Tori Jode Lippe PT, DPT 02/25/24, 3:14 PM

## 2024-02-25 NOTE — Progress Notes (Signed)
 Progress Note   PatientTIARAH [name] FMW:[id number] DOB: Jun 19, 1935 DOA: 02/21/2024     3 DOS: the patient was seen and examined on 02/25/2024   Brief hospital course: 89 year old female history of rheumatoid arthritis, dementia, hyperlipidemia, hypertension, hypothyroidism, leukemia presented to [facility] due to shortness of breath, cough, decreased intake and lethargy.  Patient also noted to have a fever on presentation with a temp of 101.7.  Chest x-ray done unremarkable.  Patient received a dose of IV Vanco and cefepime .  Patient admitted due to concerns of fever of unknown origin.  Patient pancultured.  Patient placed empirically on cefepime  and azithromycin .  CT abdomen and pelvis and CT chest done with no source of fever.  Due to ongoing fevers despite empiric IV antibiotics ID consulted.  Oncology following.   Assessment and Plan: #1 fever/history of chronic neutropenia on G-CSF - Patient noted to have presented with fever noted in the ED to have a temp of 102.5.??  Tumor fever - Patient also with a leukocytosis, however patient on G-CSF. - Patient presented with respiratory complaints of shortness of breath, cough. - Chest x-ray done with no acute infiltrate noted. - CT chest, CT abdomen and pelvis ordered with no acute abnormalities.  - Blood cultures pending. - SARS coronavirus 2 by PCR negative, influenza A and B by PCR negative, RSV by PCR negative. -Respiratory viral panel ordered this morning and negative. - Continue Flonase , Mucinex , Claritin , Protonix . - initially continued cefepime , azithromycin  ,vanc - ID consulted. Recs to complete doxycycline  on 7/26.  - discussed with Oncology. Concerns that pt is not tolerating adequate PO intake. Recs for hospice noted. TOC following   2.  Dehydration -IV fluids.   3.  Rheumatoid arthritis -Continue to hold Arava  and resume after completion of antibiotic course.   4.  Alzheimer's dementia - Continue Aricept .    5.   Hypothyroidism - Continue Synthroid .    6.  Iron deficiency anemia -H&H stable at 9.5. - Follow. - Transfuse for Hgb < 8.   7.  Large granular lymphocytic leukemia/chronic neutropenia -Informed oncology via epic of admission. -On GM-CSF every 2-3 weeks. -Patient started on Leukine  per oncology. -Oncology following.   8.  Hypertension - Continue Lotensin .     9.  Hyperlipidemia -Outpatient follow-up with PCP.   10.  Murmur -Patient with murmur noted on examination. - 2D echo from 12/06/2020 with mild aortic valve stenosis. - Repeat 2D echo this hospitalization with a EF of 60 to 65%, and WMA, severe asymmetric LVH of the basal septal segment.  Mild MVR.  Mild to moderate AVR.  Mild aortic valve stenosis.  - Outpatient follow-up pending hospital course.   11.  CKD stage IIIb -Stable.      Subjective: No complaints this AM  Physical Exam: Vitals:   02/25/24 0951 02/25/24 1020 02/25/24 1300 02/25/24 1336  BP:  (!) 128/59 122/60 (!) 122/59  Pulse:    (!) 105  Resp: 17 19  20   Temp:  97.7 F (36.5 C) 97.9 F (36.6 C) (!) 97.4 F (36.3 C)  TempSrc:  Oral Oral   SpO2:  97%  97%  Weight:      Height:       General exam: Conversant, in no acute distress Respiratory system: normal chest rise, clear, no audible wheezing Cardiovascular system: regular rhythm, s1-s2 Gastrointestinal system: Nondistended, nontender, pos BS Central nervous system: No seizures, no tremors Extremities: No cyanosis, no joint deformities Skin: No rashes, no pallor Psychiatry: Affect  normal // no auditory hallucinations   Data Reviewed:  Labs reviewed: Na 135, K 3.5, Cr 1.10, WBC 10.5, Hgb 9.7, Plts 133  Family Communication: Pt in room, family at bedside  Disposition: Status is: Inpatient Remains inpatient appropriate because: severity of illness  Planned Discharge Destination: Home    Author: Garnette Pelt, MD 02/25/2024 5:20 PM  For on call review [email or web address].

## 2024-02-26 DIAGNOSIS — C91Z0 Other lymphoid leukemia not having achieved remission: Secondary | ICD-10-CM | POA: Diagnosis not present

## 2024-02-26 DIAGNOSIS — R509 Fever, unspecified: Secondary | ICD-10-CM | POA: Diagnosis not present

## 2024-02-26 LAB — CULTURE, BLOOD (ROUTINE X 2)
Culture: NO GROWTH
Culture: NO GROWTH
Special Requests: ADEQUATE

## 2024-02-26 LAB — CBC WITH DIFFERENTIAL/PLATELET
Abs Immature Granulocytes: 0.01 K/uL (ref 0.00–0.07)
Basophils Absolute: 0 K/uL (ref 0.0–0.1)
Basophils Relative: 0 %
Eosinophils Absolute: 0 K/uL (ref 0.0–0.5)
Eosinophils Relative: 0 %
HCT: 34.5 % — ABNORMAL LOW (ref 36.0–46.0)
Hemoglobin: 10.4 g/dL — ABNORMAL LOW (ref 12.0–15.0)
Immature Granulocytes: 0 %
Lymphocytes Relative: 91 %
Lymphs Abs: 8.3 K/uL — ABNORMAL HIGH (ref 0.7–4.0)
MCH: 25.3 pg — ABNORMAL LOW (ref 26.0–34.0)
MCHC: 30.1 g/dL (ref 30.0–36.0)
MCV: 83.9 fL (ref 80.0–100.0)
Monocytes Absolute: 0.6 K/uL (ref 0.1–1.0)
Monocytes Relative: 7 %
Neutro Abs: 0.2 K/uL — CL (ref 1.7–7.7)
Neutrophils Relative %: 2 %
Platelets: 163 K/uL (ref 150–400)
RBC: 4.11 MIL/uL (ref 3.87–5.11)
RDW: 16.2 % — ABNORMAL HIGH (ref 11.5–15.5)
WBC: 9.3 K/uL (ref 4.0–10.5)
nRBC: 0 % (ref 0.0–0.2)

## 2024-02-26 MED ORDER — ACYCLOVIR 200 MG PO CAPS
400.0000 mg | ORAL_CAPSULE | Freq: Two times a day (BID) | ORAL | Status: DC
  Administered 2024-02-26: 400 mg via ORAL
  Filled 2024-02-26 (×2): qty 2

## 2024-02-26 MED ORDER — DOXYCYCLINE HYCLATE 100 MG PO TABS: 100.0000 mg | ORAL_TABLET | Freq: Two times a day (BID) | ORAL | 0 refills | Status: AC

## 2024-02-26 NOTE — Plan of Care (Signed)

## 2024-02-26 NOTE — Discharge Summary (Signed)
 Physician Discharge Summary   Patient: [name] [name] MRN: [id number] DOB: [birth year]  Admit date:     02/21/2024  Discharge date: 02/26/24  Discharge Physician: Garnette Pelt   PCP: [name], MD   Recommendations at discharge:    Follow up with Oncology as needed Follow up with hospice services  Discharge Diagnoses: Principal Problem:   FUO (fever of unknown origin) Active Problems:   Dyslipidemia   Essential hypertension   Rheumatoid arthritis (HCC)   History of hypothyroidism   Chronic kidney disease (CKD) stage G3b   Paroxysmal supraventricular tachycardia (HCC)   Iron deficiency anemia   Large granular lymphocytic leukemia (HCC)   Dehydration   Murmur   Chronic neutropenia (HCC)   Immunosuppressed status (HCC)  Resolved Problems:   * No resolved hospital problems. *  Hospital Course: 89 year old female history of rheumatoid arthritis, dementia, hyperlipidemia, hypertension, hypothyroidism, leukemia presented to [facility] due to shortness of breath, cough, decreased intake and lethargy.  Patient also noted to have a fever on presentation with a temp of 101.7.  Chest x-ray done unremarkable.  Patient received a dose of IV Vanco and cefepime .  Patient admitted due to concerns of fever of unknown origin.  Patient pancultured.  Patient placed empirically on cefepime  and azithromycin .  CT abdomen and pelvis and CT chest done with no source of fever.  Due to ongoing fevers despite empiric IV antibiotics ID consulted.  Oncology following.   Assessment and Plan: #1 fever/history of chronic neutropenia on G-CSF - Patient noted to have presented with fever noted in the ED to have a temp of 102.5.??  Tumor fever - Patient also with a leukocytosis, however patient on G-CSF. - Patient presented with respiratory complaints of shortness of breath, cough. - Chest x-ray done with no acute infiltrate noted. - CT chest, CT abdomen and pelvis ordered with no acute  abnormalities.  - Blood cultures pending. - SARS coronavirus 2 by PCR negative, influenza A and B by PCR negative, RSV by PCR negative. -Respiratory viral panel ordered this morning and negative. - Continue Flonase , Mucinex , Claritin , Protonix . - initially continued cefepime , azithromycin  ,vanc - ID consulted. Recs to complete doxycycline  on 7/26.  - discussed with Oncology. Concerns that pt is not tolerating adequate PO intake. Recs for hospice noted.    2.  Dehydration -IV fluids were given this visit   3.  Rheumatoid arthritis -Continue to held Arava  given concerns of infection   4.  Alzheimer's dementia - Continued Aricept .    5.  Hypothyroidism - Continue Synthroid .    6.  Iron deficiency anemia -H&H stable    7.  Large granular lymphocytic leukemia/chronic neutropenia -Informed oncology via epic of admission. -On GM-CSF every 2-3 weeks. -Patient started on Leukine  per oncology. -Oncology following.   8.  Hypertension - Continue Lotensin .     9.  Hyperlipidemia -Outpatient follow-up with PCP.   10.  Murmur -Patient with murmur noted on examination. - 2D echo from 12/06/2020 with mild aortic valve stenosis. - Repeat 2D echo this hospitalization with a EF of 60 to 65%, and WMA, severe asymmetric LVH of the basal septal segment.  Mild MVR.  Mild to moderate AVR.  Mild aortic valve stenosis.  - Home with hospice on d/c per above   11.  CKD stage IIIb -Stable.    Consultants: Oncology Procedures performed:   Disposition: Home Diet recommendation:  Regular diet DISCHARGE MEDICATION: Allergies as of 02/26/2024       Reactions  Tramadol  Nausea Only   Codeine Phosphate Nausea Only        Medication List     STOP taking these medications    Jardiance  10 MG Tabs tablet Generic drug: empagliflozin    leflunomide  10 MG tablet Commonly known as: ARAVA        TAKE these medications    acetaminophen  325 MG tablet Commonly known as: TYLENOL  Take 650 mg  by mouth as needed for headache.   benazepril  5 MG tablet Commonly known as: LOTENSIN  TAKE 2 TABLETS (10MG  TOTAL) BY MOUTH EVERY DAY   donepezil  5 MG disintegrating tablet Commonly known as: ARICEPT  ODT Take 1 tablet (5 mg total) by mouth at bedtime. What changed: when to take this   doxycycline  100 MG tablet Commonly known as: VIBRA -TABS Take 1 tablet (100 mg total) by mouth every 12 (twelve) hours for 1 day.   folic acid  1 MG tablet Commonly known as: FOLVITE  Take 1 mg by mouth daily.   levothyroxine  88 MCG tablet Commonly known as: SYNTHROID  TAKE 1 TABLET BY MOUTH EVERY DAY BEFORE BREAKFAST   megestrol  400 MG/10ML suspension Commonly known as: [name]  Take 10 mLs (400 mg total) by mouth daily.        Follow-up Information     [name], MD Follow up.   Specialty: Family Medicine Why: As needed Contact information: [address] [place] [address] [phone]                Discharge Exam: [name] [name]   02/22/24 0900 02/23/24 0643  Weight: 51.4 kg 54.2 kg   General exam: Awake, laying in bed, in nad Respiratory system: Normal respiratory effort, no wheezing Cardiovascular system: regular rate, s1, s2 Gastrointestinal system: Soft, nondistended, positive BS Central nervous system: CN2-12 grossly intact, strength intact Extremities: Perfused, no clubbing Skin: Normal skin turgor, no notable skin lesions seen Psychiatry: Mood normal // no visual hallucinations   Condition at discharge: fair  The results of significant diagnostics from this hospitalization (including imaging, microbiology, ancillary and laboratory) are listed below for reference.   Imaging Studies: ECHOCARDIOGRAM COMPLETE Result Date: 02/23/2024    ECHOCARDIOGRAM REPORT   Patient Name:   [name] [name] Date of Exam: 02/23/2024 Medical Rec #:  [id number]        Height:       61.5 in Accession #:    [id number]       Weight:       119.4 lb Date of Birth:   [birth year]        BSA:          1.526 m Patient Age:    89 years         BP:           145/66 mmHg Patient Gender: F                HR:           101 bpm. Exam Location:  Inpatient Procedure: 2D Echo, Cardiac Doppler and Color Doppler (Both Spectral and Color            Flow Doppler were utilized during procedure). Indications:    Murmur  History:        Patient has prior history of Echocardiogram examinations, most                 recent 12/06/2020. Risk Factors:Dyslipidemia and Hypertension.  Sonographer:    Philomena Daring Referring Phys: [id number] TORIBIO GAILS THOMPSON IMPRESSIONS  1. Left ventricular ejection fraction, by estimation, is 60 to 65%. The left ventricle has normal function. The left ventricle has no regional wall motion abnormalities. There is severe asymmetric left ventricular hypertrophy of the basal-septal segment. Left ventricular diastolic parameters are indeterminate.  2. Right ventricular systolic function is normal. The right ventricular size is normal. There is moderately elevated pulmonary artery systolic pressure. The estimated right ventricular systolic pressure is 51.2 mmHg.  3. The mitral valve is grossly normal. Mild mitral valve regurgitation. Moderate mitral annular calcification.  4. The aortic valve is tricuspid. There is mild calcification of the aortic valve. Aortic valve regurgitation is moderate to severe. Mild aortic valve stenosis.  5. The inferior vena cava is dilated in size with <50% respiratory variability, suggesting right atrial pressure of 15 mmHg. Comparison(s): Prior images reviewed side by side. Aortic regurgitation has increased. Conclusion(s)/Recommendation(s): 2D Vena Contract not well seen. RVOT Doppler not well interrogated for regurgitant fraction/volume. Consider TEE or CMR for quantification or aortic regurgitation in the future if clinically indicated. FINDINGS  Left Ventricle: Left ventricular ejection fraction, by estimation, is 60 to 65%. The left ventricle has  normal function. The left ventricle has no regional wall motion abnormalities. The left ventricular internal cavity size was normal in size. There is  severe asymmetric left ventricular hypertrophy of the basal-septal segment. Left ventricular diastolic parameters are indeterminate. Right Ventricle: The right ventricular size is normal. No increase in right ventricular wall thickness. Right ventricular systolic function is normal. There is moderately elevated pulmonary artery systolic pressure. The tricuspid regurgitant velocity is 3.01 m/s, and with an assumed right atrial pressure of 15 mmHg, the estimated right ventricular systolic pressure is 51.2 mmHg. Left Atrium: Left atrial size was normal in size. Right Atrium: Right atrial size was normal in size. Pericardium: There is no evidence of pericardial effusion. Mitral Valve: The mitral valve is grossly normal. Moderate mitral annular calcification. Mild mitral valve regurgitation. Tricuspid Valve: The tricuspid valve is normal in structure. Tricuspid valve regurgitation is trivial. No evidence of tricuspid stenosis. Aortic Valve: The aortic valve is tricuspid. There is mild calcification of the aortic valve. Aortic valve regurgitation is moderate to severe. Mild aortic stenosis is present. Aortic valve mean gradient measures 15.0 mmHg. Aortic valve peak gradient measures 27.2 mmHg. Aortic valve area, by VTI measures 1.16 cm. Pulmonic Valve: The pulmonic valve was normal in structure. Pulmonic valve regurgitation is not visualized. No evidence of pulmonic stenosis. Aorta: The aortic root and ascending aorta are structurally normal, with no evidence of dilitation. Venous: The inferior vena cava is dilated in size with less than 50% respiratory variability, suggesting right atrial pressure of 15 mmHg. IAS/Shunts: The atrial septum is grossly normal.  LEFT VENTRICLE PLAX 2D LVIDd:         4.30 cm   Diastology LVIDs:         2.70 cm   LV e' medial:    6.74 cm/s LV  PW:         1.00 cm   LV E/e' medial:  24.5 LV IVS:        1.10 cm   LV e' lateral:   4.90 cm/s LVOT diam:     1.70 cm   LV E/e' lateral: 33.7 LV SV:         45 LV SV Index:   30 LVOT Area:     2.27 cm  RIGHT VENTRICLE             IVC  RV S prime:     12.10 cm/s  IVC diam: 2.60 cm TAPSE (M-mode): 2.2 cm LEFT ATRIUM             Index        RIGHT ATRIUM           Index LA diam:        2.90 cm 1.90 cm/m   RA Area:     12.10 cm LA Vol (A2C):   37.7 ml 24.70 ml/m  RA Volume:   28.80 ml  18.87 ml/m LA Vol (A4C):   31.1 ml 20.38 ml/m LA Biplane Vol: 34.4 ml 22.54 ml/m  AORTIC VALVE AV Area (Vmax):    1.00 cm AV Area (Vmean):   0.98 cm AV Area (VTI):     1.16 cm AV Vmax:           261.00 cm/s AV Vmean:          173.000 cm/s AV VTI:            0.391 m AV Peak Grad:      27.2 mmHg AV Mean Grad:      15.0 mmHg LVOT Vmax:         114.50 cm/s LVOT Vmean:        74.950 cm/s LVOT VTI:          0.200 m LVOT/AV VTI ratio: 0.51  AORTA Ao Root diam: 3.20 cm Ao Asc diam:  3.00 cm MITRAL VALVE                TRICUSPID VALVE MV Area (PHT): 5.31 cm     TR Peak grad:   36.2 mmHg MV Decel Time: 143 msec     TR Vmax:        301.00 cm/s MV E velocity: 165.00 cm/s MV A velocity: 154.00 cm/s  SHUNTS MV E/A ratio:  1.07         Systemic VTI:  0.20 m                             Systemic Diam: 1.70 cm Stanly Leavens MD Electronically signed by Stanly Leavens MD Signature Date/Time: 02/23/2024/10:58:57 AM    Final    DG CHEST PORT 1 VIEW Result Date: 02/23/2024 CLINICAL DATA:  Shortness of breath EXAM: PORTABLE CHEST 1 VIEW COMPARISON:  02/21/2024 FINDINGS: Reverse lordotic projection. Airway thickening is present, suggesting bronchitis or reactive airways disease. Subtle reticular interstitial accentuation without Kerley B lines are cardiomegaly. No blunting of the costophrenic angles. Lower cervical plate and screw fixator. Thoracic spondylosis. Calcific tendinopathy along the rotator cuff region bilaterally. IMPRESSION:  1. Airway thickening is present, suggesting bronchitis or reactive airways disease. 2. Subtle reticular interstitial accentuation without Kerley B lines or cardiomegaly. Early atypical pneumonia not excluded. 3. Thoracic spondylosis. 4. Calcific tendinopathy along the rotator cuff region bilaterally. Electronically Signed   By: Ryan Salvage M.D.   On: 02/23/2024 09:19   CT ABDOMEN PELVIS W WO CONTRAST Result Date: 02/22/2024 CLINICAL DATA:  Fever.  Sepsis.  Neutropenia. EXAM: CT ABDOMEN AND PELVIS WITHOUT AND WITH CONTRAST TECHNIQUE: Multidetector CT imaging of the abdomen and pelvis was performed following the standard protocol before and following the bolus administration of intravenous contrast. RADIATION DOSE REDUCTION: This exam was performed according to the departmental dose-optimization program which includes automated exposure control, adjustment of the mA and/or kV according to patient size and/or use of iterative reconstruction technique. CONTRAST:  OMNIPAQUE  IOHEXOL  300 MG/ML  SOLN COMPARISON:  11/03/2022 FINDINGS: Lower Chest: No acute findings. Hepatobiliary: A few small hepatic cysts are again seen. No suspicious hepatic masses identified. Prior cholecystectomy. No evidence of biliary obstruction. Pancreas:  No mass or inflammatory changes. Spleen: Within normal limits in size and appearance. Adrenals/Urinary Tract: No suspicious masses identified. No evidence of ureteral calculi or hydronephrosis. Unremarkable unopacified urinary bladder. Stomach/Bowel: No evidence of obstruction, inflammatory process or abnormal fluid collections. Vascular/Lymphatic: No pathologically enlarged lymph nodes. No acute vascular findings. Reproductive: Prior hysterectomy noted. Adnexal regions are unremarkable in appearance. Other:  None. Musculoskeletal:  No suspicious bone lesions identified. IMPRESSION: No acute findings or other significant abnormality. Electronically Signed   By: Norleen DELENA Kil M.D.    On: 02/22/2024 17:12   CT CHEST WO CONTRAST Result Date: 02/22/2024 CLINICAL DATA:  Fever without source, neutropenia EXAM: CT CHEST WITHOUT CONTRAST TECHNIQUE: Multidetector CT imaging of the chest was performed following the standard protocol without IV contrast. RADIATION DOSE REDUCTION: This exam was performed according to the departmental dose-optimization program which includes automated exposure control, adjustment of the mA and/or kV according to patient size and/or use of iterative reconstruction technique. COMPARISON:  Chest radiograph February 21, 2024, chest CT November 03, 2022 and September 24, 2022 FINDINGS: Cardiovascular: The heart size is normal. Atherosclerotic calcifications of coronary arteries and aortic annulus. No significant pericardial fluid. Mediastinum/Nodes: Multi station subcentimeter and pericentimeter mediastinal lymph nodes. Lungs/Pleura: Solid noncalcified nodule in left lung apex measuring 4 mm (5/28). Additional nodule in left upper lobe measuring 4 mm likely a prominent vascular structure. (5/59). Stable ground-glass nodule measuring 3 mm in right lobe upper lobe (5/37). There is bronchial and bronchiolar wall thickening with mild bronchiectasis. Bibasilar atelectasis. No significant consolidation or honeycombing. No ground-glass attenuation or significant architectural distortion. Mild peri osteophyte fibrosis along the posteromedial aspect of right lower lobe (5/ 104). No pleural effusion. A Upper Abdomen: Cholecystectomy. Hiatal hernia. Atherosclerotic changes of the aorta. Right hepatic lobe hypodense lesion along the posterior aspect incompletely characterized on current exam. Please refer to same day CT abdomen pelvis. Musculoskeletal: Status post ACDF. Multilevel degenerative changes of the spine. IMPRESSION: Bilateral upper lobe pulmonary nodules measuring up to 4 mm, stable to prior dated back to September 24, 2022. Follow-up in 1 year to ensure stability. Mild bibasilar  atelectasis and Peri osteophyte fibrosis along posteromedial right lower lobe. No consolidation. Diffuse bronchial and bronchiolar wall thickening suggestive of medium and large size airway disease. Multi station mediastinal lymph nodes, likely reactive. Electronically Signed   By: Megan  Zare M.D.   On: 02/22/2024 16:18   DG Chest Portable 1 View Result Date: 02/21/2024 CLINICAL DATA:  Short of breath.  Cough. EXAM: PORTABLE CHEST 1 VIEW COMPARISON:  10/31/2022 and older exams. FINDINGS: Cardiac silhouette is normal in size. No mediastinal or hilar masses. Lungs are hyperexpanded, but clear. No pleural effusion or pneumothorax. Skeletal structures are demineralized, grossly intact. IMPRESSION: No active disease. Electronically Signed   By: Alm Parkins M.D.   On: 02/21/2024 17:48    Microbiology: Results for orders placed or performed during the hospital encounter of 02/21/24  Culture, blood (Routine x 2)     Status: None   Collection Time: 02/21/24  4:34 PM   Specimen: BLOOD  Result Value Ref Range Status   Specimen Description   Final    BLOOD LEFT ANTECUBITAL Performed at [facility], [address], [place], [place] [address]    Special Requests  Final    BOTTLES DRAWN AEROBIC AND ANAEROBIC Blood Culture adequate volume Performed at [facility], [address] [place], [place] [address]    Culture   Final    NO GROWTH 5 DAYS Performed at [facility], [address] [place], [place] [address]    Report Status 02/26/2024 FINAL  Final  Culture, blood (Routine x 2)     Status: None   Collection Time: 02/21/24  5:13 PM   Specimen: BLOOD  Result Value Ref Range Status   Specimen Description   Final    BLOOD RIGHT ANTECUBITAL Performed at [facility], [address] [place], [place] [address]    Special Requests   Final    BOTTLES DRAWN AEROBIC AND ANAEROBIC Blood Culture results may not be optimal due to an  inadequate volume of blood received in culture bottles Performed at [facility], [address] [place], [place] [address]    Culture   Final    NO GROWTH 5 DAYS Performed at [facility], [address] [place], [place] [address]    Report Status 02/26/2024 FINAL  Final  Resp panel by RT-PCR (RSV, Flu A&B, Covid) Anterior Nasal Swab     Status: None   Collection Time: 02/21/24  7:29 PM   Specimen: Anterior Nasal Swab  Result Value Ref Range Status   SARS Coronavirus 2 by RT PCR NEGATIVE NEGATIVE Final    Comment: (NOTE) SARS-CoV-2 target nucleic acids are NOT DETECTED.  The SARS-CoV-2 RNA is generally detectable in upper respiratory specimens during the acute phase of infection. The lowest concentration of SARS-CoV-2 viral copies this assay can detect is 138 copies/mL. A negative result does not preclude SARS-Cov-2 infection and should not be used as the sole basis for treatment or other patient management decisions. A negative result may occur with  improper specimen collection/handling, submission of specimen other than nasopharyngeal swab, presence of viral mutation(s) within the areas targeted by this assay, and inadequate number of viral copies(<138 copies/mL). A negative result must be combined with clinical observations, patient history, and epidemiological information. The expected result is Negative.  Fact Sheet for Patients:  BloggerCourse.com  Fact Sheet for Healthcare Providers:  SeriousBroker.it  This test is no t yet approved or cleared by the [place]  FDA and  has been authorized for detection and/or diagnosis of SARS-CoV-2 by FDA under an Emergency Use Authorization (EUA). This EUA will remain  in effect (meaning this test can be used) for the duration of the COVID-19 declaration under Section 564(b)(1) of the Act, 21 U.S.C.section 360bbb-3(b)(1), unless the authorization is  terminated  or revoked sooner.       Influenza A by PCR NEGATIVE NEGATIVE Final   Influenza B by PCR NEGATIVE NEGATIVE Final    Comment: (NOTE) The Xpert Xpress SARS-CoV-2/FLU/RSV plus assay is intended as an aid in the diagnosis of influenza from Nasopharyngeal swab specimens and should not be used as a sole basis for treatment. Nasal washings and aspirates are unacceptable for Xpert Xpress SARS-CoV-2/FLU/RSV testing.  Fact Sheet for Patients: BloggerCourse.com  Fact Sheet for Healthcare Providers: SeriousBroker.it  This test is not yet approved or cleared by the [place]  FDA and has been authorized for detection and/or diagnosis of SARS-CoV-2 by FDA under an Emergency Use Authorization (EUA). This EUA will remain in effect (meaning this test can be used) for the duration of the COVID-19 declaration under Section 564(b)(1) of the Act, 21 U.S.C. section 360bbb-3(b)(1),  unless the authorization is terminated or revoked.     Resp Syncytial Virus by PCR NEGATIVE NEGATIVE Final    Comment: (NOTE) Fact Sheet for Patients: BloggerCourse.com  Fact Sheet for Healthcare Providers: SeriousBroker.it  This test is not yet approved or cleared by the [place]  FDA and has been authorized for detection and/or diagnosis of SARS-CoV-2 by FDA under an Emergency Use Authorization (EUA). This EUA will remain in effect (meaning this test can be used) for the duration of the COVID-19 declaration under Section 564(b)(1) of the Act, 21 U.S.C. section 360bbb-3(b)(1), unless the authorization is terminated or revoked.  Performed at [facility], [address] [place], [place] [address]   Respiratory (~20 pathogens) panel by PCR     Status: None   Collection Time: 02/23/24 10:05 AM   Specimen: Nasopharyngeal Swab; Respiratory  Result Value Ref Range Status    Adenovirus NOT DETECTED NOT DETECTED Final   Coronavirus 229E NOT DETECTED NOT DETECTED Final    Comment: (NOTE) The Coronavirus on the Respiratory Panel, DOES NOT test for the novel  Coronavirus (2019 nCoV)    Coronavirus HKU1 NOT DETECTED NOT DETECTED Final   Coronavirus NL63 NOT DETECTED NOT DETECTED Final   Coronavirus OC43 NOT DETECTED NOT DETECTED Final   Metapneumovirus NOT DETECTED NOT DETECTED Final   Rhinovirus / Enterovirus NOT DETECTED NOT DETECTED Final   Influenza A NOT DETECTED NOT DETECTED Final   Influenza B NOT DETECTED NOT DETECTED Final   Parainfluenza Virus 1 NOT DETECTED NOT DETECTED Final   Parainfluenza Virus 2 NOT DETECTED NOT DETECTED Final   Parainfluenza Virus 3 NOT DETECTED NOT DETECTED Final   Parainfluenza Virus 4 NOT DETECTED NOT DETECTED Final   Respiratory Syncytial Virus NOT DETECTED NOT DETECTED Final   Bordetella pertussis NOT DETECTED NOT DETECTED Final   Bordetella Parapertussis NOT DETECTED NOT DETECTED Final   Chlamydophila pneumoniae NOT DETECTED NOT DETECTED Final   Mycoplasma pneumoniae NOT DETECTED NOT DETECTED Final    Comment: Performed at [facility], [address] [place], [place] [address]    Labs: CBC: Recent Labs  Lab 02/21/24 1635 02/22/24 0701 02/23/24 0414 02/24/24 0707 02/25/24 0412 02/26/24 0825  WBC 14.0* 10.5 13.3* 11.1* 10.5 9.3  NEUTROABS 1.0*  --  1.1* 1.0* 0.6* 0.2*  HGB 10.5* 9.5* 9.5* 9.5* 9.7* 10.4*  HCT 33.9* 31.0* 31.7* 31.6* 31.6* 34.5*  MCV 82.7 84.0 83.6 83.2 82.1 83.9  PLT 142* 123* 107* 135* 133* 163   Basic Metabolic Panel: Recent Labs  Lab 02/21/24 1635 02/22/24 0701 02/23/24 0414 02/24/24 0407 02/25/24 0412  NA 137 137 135 135 135  K 4.3 3.8 3.7 3.8 3.5  CL 103 107 108 113* 112*  CO2 21* 19* 19* 12* 19*  GLUCOSE 103* 100* 113* 92 97  BUN 21 19 17 16 18   CREATININE 1.17* 0.83 1.16* 0.96 1.10*  CALCIUM  8.8* 8.2* 7.8* 7.1* 7.9*  MG  --  1.7 1.9  --   --   PHOS  --  2.5  --   1.5*  --    Liver Function Tests: Recent Labs  Lab 02/21/24 1635 02/22/24 0701 02/24/24 0407 02/25/24 0412  AST 28 22  --  78*  ALT 8 12  --  34  ALKPHOS 91 64  --  74  BILITOT 0.5 0.9  --  0.8  PROT 7.2 6.5  --  5.9*  ALBUMIN 3.6 2.9* 2.2* 2.2*   CBG: No results for input(s): GLUCAP  in the last 168 hours.  Discharge time spent: less than 30 minutes.  Signed: Garnette Pelt, MD [facility] 02/26/2024

## 2024-02-26 NOTE — TOC Progression Note (Addendum)
 Transition of Care [facility]) - Progression Note    Patient Details  Name: [name] [name] MRN: [id number] Date of Birth: [birth year]  Transition of Care [facility]) CM/SW Contact  [name], LCSW Phone Number: 02/26/2024, 12:18 PM  Clinical Narrative:     CSW met with the patient's son at the bedside to discuss recommendations for hospice services. The patient has chosen [facility] for home hospice services. CSW sent the referral to Cheri with [facility]. The patient's son discussed his concerns about the patient returning home alone. He reported that he will be with the patient this weekend, but will need to return to work on Monday. CSW suggested private duty care and explained that it is an out-of-pocket expense. CSW inquired if the patient's son would like a referral sent to [employer]. Care management to follow.  Expected Discharge Plan: Home w Hospice Care Barriers to Discharge: Continued Medical Work up               Expected Discharge Plan and Services                                               Social Drivers of Health (SDOH) Interventions SDOH Screenings   Food Insecurity: No Food Insecurity (02/22/2024)  Housing: Low Risk  (02/22/2024)  Transportation Needs: No Transportation Needs (02/22/2024)  Utilities: Not At Risk (02/22/2024)  Alcohol Screen: Low Risk  (11/02/2023)  Depression (PHQ2-9): Low Risk  (02/03/2024)  Financial Resource Strain: Low Risk  (11/02/2023)  Physical Activity: Patient Declined (11/02/2023)  Social Connections: Socially Isolated (02/22/2024)  Stress: Patient Unable To Answer (11/02/2023)  Tobacco Use: Low Risk  (02/21/2024)  Health Literacy: Patient Unable To Answer (11/02/2023)  Recent Concern: Health Literacy - Inadequate Health Literacy (08/14/2023)    Readmission Risk Interventions    11/04/2022    3:46 PM 09/26/2022   10:27 AM  Readmission Risk Prevention Plan  Transportation Screening Complete  Complete  PCP or Specialist Appt within 5-7 Days  Complete  PCP or Specialist Appt within 3-5 Days Complete   Home Care Screening  Complete  Medication Review (RN CM)  Complete  HRI or Home Care Consult Complete   Social Work Consult for Recovery Care Planning/Counseling Complete   Palliative Care Screening Complete   Medication Review [facility]) Complete

## 2024-02-26 NOTE — Progress Notes (Signed)
   This pt was referred to hospice care at home. We have met with and spoken to the pt's [name]- he is in agreement with the pt. Having hospice care and states that he does not feel at this time she needs any additional equipment. She has a walker, shower chair and cane in home already. Our MD has been updated after reviewing the chart and she agrees pt is appropriate for hospice care at home. We will be happy to assist with intake visit when she is ready for d/c. Magdalena Berber RN [phone]

## 2024-02-26 NOTE — Progress Notes (Signed)
 Mobility Specialist - Progress Note   02/26/24 1515  Mobility  Activity Ambulated with assistance in hallway  Level of Assistance Minimal assist, patient does 75% or more  Assistive Device Front wheel walker  Distance Ambulated (ft) 16 ft  Range of Motion/Exercises Active  Activity Response Tolerated well  Mobility Referral Yes  Mobility visit 1 Mobility  Mobility Specialist Start Time (ACUTE ONLY) 1450  Mobility Specialist Stop Time (ACUTE ONLY) 1515  Mobility Specialist Time Calculation (min) (ACUTE ONLY) 25 min   Pt was found in bed and agreeable to ambulate. Grew fatigued with session. At EOS returned to bed with all needs met. Call bell in reach.  Erminio Leos,  Mobility Specialist Can be reached via Secure Chat

## 2024-02-26 NOTE — Progress Notes (Signed)
 [name] [name] is about the same.  She has not had a temperature for couple days.  Cultures were all negative.  I think she is on acyclovir  and doxycycline .  Again, she is not really eating much.  I am not sure how much the [name]  elixir is helping her.  There is no problems with pain.  She is having no problems with cough.  It would be nice to try to minimize her medications.  Hopefully, she can get off the Mucinex .  Hopefully she can get off the Claritin .  She has had no change in bowel or bladder habits.  She has had no nausea or vomiting.  There is no bleeding.  There has been no leg swelling.  She is out of bed a little bit.  Again, I spoke to her son yesterday.  We talked about her situation.  We we will get Hospice for her when she is discharged.  Sound like she may be discharged quite soon.  There are no labs today.  I really did have lab work back.  Vital signs show temperature of 98.7.  Pulse 103.  Blood pressure 136/68.  Her lungs sound clear bilaterally.  She has good air movement bilaterally.  Cardiac exam tachycardic but regular.  There are no murmurs.  Oral exam does not show any mucositis.  Abdomen soft.  Bowel sounds are present.  There is no fluid wave.  Extremities shows no clubbing, cyanosis or edema.  Skin exam shows no rashes.  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  She has chronic large granular lymphocytic leukemia.  She has been afebrile.  She looks good.  I think she probably could go home.  Again, if she goes home, I will get Hospice for her.  I am sure she will have intermittent temperature spikes.  I just would not want to see her be admitted back to the hospital for this.  She does understand Hospice.  She is agreeable to this.  I do think the staff on [address].  They have done a tremendous job with her.  Jeralyn Crease, MD  Ila 7:14

## 2024-02-26 NOTE — Plan of Care (Incomplete)
  Problem: Education: Goal: Knowledge of General Education information will improve Description: Including pain rating scale, medication(s)/side effects and non-pharmacologic comfort measures Outcome: Progressing   Problem: Health Behavior/Discharge Planning: Goal: Ability to manage health-related needs will improve Outcome: Progressing   Problem: Clinical Measurements: Goal: Ability to maintain clinical measurements within normal limits will improve Outcome: Progressing Goal: Will remain free from infection Outcome: Progressing Goal: Diagnostic test results will improve Outcome: Progressing   Problem: Activity: Goal: Risk for activity intolerance will decrease Outcome: Progressing   Problem: Nutrition: Goal: Adequate nutrition will be maintained Outcome: Progressing   Problem: Coping: Goal: Level of anxiety will decrease Outcome: Progressing   Problem: Safety: Goal: Ability to remain free from injury will improve Outcome: Progressing   Problem: Clinical Measurements: Goal: Respiratory complications will improve Outcome: Adequate for Discharge Goal: Cardiovascular complication will be avoided Outcome: Adequate for Discharge   Problem: Elimination: Goal: Will not experience complications related to bowel motility Outcome: Adequate for Discharge Goal: Will not experience complications related to urinary retention Outcome: Adequate for Discharge   Problem: Skin Integrity: Goal: Risk for impaired skin integrity will decrease Outcome: Adequate for Discharge

## 2024-02-29 ENCOUNTER — Telehealth: Payer: Self-pay

## 2024-03-02 ENCOUNTER — Telehealth: Payer: Self-pay | Admitting: Family Medicine

## 2024-03-02 ENCOUNTER — Telehealth: Payer: Self-pay | Admitting: Rheumatology

## 2024-03-03 ENCOUNTER — Encounter: Payer: Self-pay | Admitting: *Deleted

## 2024-03-03 NOTE — Progress Notes (Signed)
 Flowers sent to patients family upon her passing per Dr Timmy request.

## 2024-03-04 NOTE — Telephone Encounter (Signed)
 Patients son called to cancel her appt. Pt has passed away. Pts son wanted to inform staff.

## 2024-03-04 NOTE — Telephone Encounter (Signed)
 Copied from CRM [id number]. Topic: General - Deceased Patient >> March 05, 2024 12:46 PM Drema MATSU wrote: Name of caller: [name] Bud  Date of death: 2024[birth year]03[birth year]05   Name of funeral home: [employer] [facility]   Phone number of funeral home: [phone]  Provider that needs to sign form: patient just wants to let provider know at this time   Timeline for signing: n/a

## 2024-03-04 DEATH — deceased

## 2024-03-23 ENCOUNTER — Ambulatory Visit: Admitting: Rheumatology

## 2024-03-24 ENCOUNTER — Ambulatory Visit: Admitting: Family Medicine

## 2024-03-30 ENCOUNTER — Inpatient Hospital Stay

## 2024-03-30 ENCOUNTER — Ambulatory Visit: Admitting: Hematology & Oncology

## 2024-11-04 ENCOUNTER — Ambulatory Visit
